# Patient Record
Sex: Male | Born: 1945
Health system: Southern US, Community
[De-identification: ages and names within clinical notes are randomized; demographics above are authoritative.]

## PROBLEM LIST (undated history)

## (undated) DIAGNOSIS — I951 Orthostatic hypotension: Secondary | ICD-10-CM

## (undated) DIAGNOSIS — G4730 Sleep apnea, unspecified: Secondary | ICD-10-CM

## (undated) DIAGNOSIS — T7840XA Allergy, unspecified, initial encounter: Secondary | ICD-10-CM

## (undated) DIAGNOSIS — G4733 Obstructive sleep apnea (adult) (pediatric): Secondary | ICD-10-CM

## (undated) DIAGNOSIS — K219 Gastro-esophageal reflux disease without esophagitis: Secondary | ICD-10-CM

## (undated) DIAGNOSIS — N200 Calculus of kidney: Secondary | ICD-10-CM

## (undated) DIAGNOSIS — Z789 Other specified health status: Secondary | ICD-10-CM

## (undated) DIAGNOSIS — H269 Unspecified cataract: Secondary | ICD-10-CM

## (undated) DIAGNOSIS — Z86010 Personal history of colonic polyps: Secondary | ICD-10-CM

## (undated) DIAGNOSIS — Z860101 Personal history of adenomatous and serrated colon polyps: Secondary | ICD-10-CM

## (undated) DIAGNOSIS — N400 Enlarged prostate without lower urinary tract symptoms: Secondary | ICD-10-CM

## (undated) DIAGNOSIS — F419 Anxiety disorder, unspecified: Secondary | ICD-10-CM

## (undated) DIAGNOSIS — F32A Depression, unspecified: Secondary | ICD-10-CM

## (undated) DIAGNOSIS — I10 Essential (primary) hypertension: Secondary | ICD-10-CM

## (undated) DIAGNOSIS — K649 Unspecified hemorrhoids: Secondary | ICD-10-CM

## (undated) DIAGNOSIS — R296 Repeated falls: Secondary | ICD-10-CM

## (undated) DIAGNOSIS — I639 Cerebral infarction, unspecified: Secondary | ICD-10-CM

## (undated) DIAGNOSIS — N138 Other obstructive and reflux uropathy: Secondary | ICD-10-CM

## (undated) DIAGNOSIS — N183 Chronic kidney disease, stage 3 unspecified: Secondary | ICD-10-CM

## (undated) DIAGNOSIS — N3941 Urge incontinence: Secondary | ICD-10-CM

## (undated) DIAGNOSIS — Z7409 Other reduced mobility: Secondary | ICD-10-CM

## (undated) DIAGNOSIS — I480 Paroxysmal atrial fibrillation: Secondary | ICD-10-CM

## (undated) DIAGNOSIS — M47812 Spondylosis without myelopathy or radiculopathy, cervical region: Secondary | ICD-10-CM

## (undated) DIAGNOSIS — E1121 Type 2 diabetes mellitus with diabetic nephropathy: Secondary | ICD-10-CM

## (undated) HISTORY — DX: Calculus of kidney: N20.0

## (undated) HISTORY — DX: Gastro-esophageal reflux disease without esophagitis: K21.9

## (undated) HISTORY — PX: OTHER SURGICAL HISTORY: SHX169

## (undated) HISTORY — DX: Repeated falls: R29.6

## (undated) HISTORY — DX: Obstructive sleep apnea (adult) (pediatric): G47.33

## (undated) HISTORY — DX: Other reduced mobility: Z74.09

## (undated) HISTORY — DX: Urge incontinence: N39.41

## (undated) HISTORY — DX: Depression, unspecified: F32.A

## (undated) HISTORY — DX: Chronic kidney disease, stage 3 unspecified: N18.30

## (undated) HISTORY — DX: Personal history of colonic polyps: Z86.010

## (undated) HISTORY — DX: Unspecified hemorrhoids: K64.9

## (undated) HISTORY — DX: Unspecified cataract: H26.9

## (undated) HISTORY — DX: Spondylosis without myelopathy or radiculopathy, cervical region: M47.812

## (undated) HISTORY — PX: CATARACT EXTRACTION: SUR2

## (undated) HISTORY — DX: Personal history of adenomatous and serrated colon polyps: Z86.0101

## (undated) HISTORY — DX: Anxiety disorder, unspecified: F41.9

## (undated) HISTORY — DX: Paroxysmal atrial fibrillation: I48.0

## (undated) HISTORY — DX: Orthostatic hypotension: I95.1

## (undated) HISTORY — DX: Other obstructive and reflux uropathy: N13.8

## (undated) HISTORY — DX: Other specified health status: Z78.9

## (undated) HISTORY — DX: Sleep apnea, unspecified: G47.30

## (undated) HISTORY — DX: Type 2 diabetes mellitus with diabetic nephropathy: E11.21

## (undated) HISTORY — PX: COLONOSCOPY: SHX174

## (undated) HISTORY — DX: Benign prostatic hyperplasia without lower urinary tract symptoms: N40.0

## (undated) HISTORY — DX: Allergy, unspecified, initial encounter: T78.40XA

---

## 1988-05-09 HISTORY — PX: ERCP: SHX60

## 1988-05-09 HISTORY — PX: CHOLECYSTECTOMY: SHX55

## 1991-05-10 HISTORY — PX: ANKLE SURGERY: SHX546

## 1996-05-09 HISTORY — PX: TONSILLECTOMY AND ADENOIDECTOMY: SUR1326

## 2011-12-01 ENCOUNTER — Ambulatory Visit (INDEPENDENT_AMBULATORY_CARE_PROVIDER_SITE_OTHER): Payer: Medicare Other | Admitting: Family Medicine

## 2011-12-01 ENCOUNTER — Encounter: Payer: Self-pay | Admitting: Family Medicine

## 2011-12-01 VITALS — BP 123/72 | HR 61 | Ht 70.5 in | Wt 221.0 lb

## 2011-12-01 DIAGNOSIS — F32A Depression, unspecified: Secondary | ICD-10-CM | POA: Insufficient documentation

## 2011-12-01 DIAGNOSIS — F329 Major depressive disorder, single episode, unspecified: Secondary | ICD-10-CM | POA: Insufficient documentation

## 2011-12-01 DIAGNOSIS — E119 Type 2 diabetes mellitus without complications: Secondary | ICD-10-CM | POA: Insufficient documentation

## 2011-12-01 DIAGNOSIS — I10 Essential (primary) hypertension: Secondary | ICD-10-CM

## 2011-12-01 LAB — POCT GLYCOSYLATED HEMOGLOBIN (HGB A1C): Hemoglobin A1C: 6.9

## 2011-12-01 MED ORDER — CLONIDINE HCL 0.3 MG PO TABS: 0.3000 mg | ORAL_TABLET | Freq: Two times a day (BID) | ORAL | Status: DC

## 2011-12-01 MED ORDER — METFORMIN HCL 500 MG PO TABS: 500.0000 mg | ORAL_TABLET | Freq: Two times a day (BID) | ORAL | Status: DC

## 2011-12-01 MED ORDER — CITALOPRAM HYDROBROMIDE 20 MG PO TABS: 20.0000 mg | ORAL_TABLET | Freq: Every day | ORAL | Status: DC

## 2011-12-01 MED ORDER — METOPROLOL TARTRATE 100 MG PO TABS: 100.0000 mg | ORAL_TABLET | Freq: Two times a day (BID) | ORAL | Status: DC

## 2011-12-01 NOTE — Progress Notes (Signed)
Subjective:    Patient ID: Benjamin Powell, male    DOB: 11-11-1945, 66 y.o.   MRN: [id number]  Hypertension This is a chronic problem. The current episode started more than 1 year ago. The problem has been resolved since onset. The problem is controlled. Pertinent negatives include no anxiety, blurred vision, chest pain, headaches, malaise/fatigue, neck pain, orthopnea, peripheral edema, shortness of breath or sweats. Risk factors for coronary artery disease include diabetes mellitus, male gender and smoking/tobacco exposure. Past treatments include ACE inhibitors, calcium channel blockers and diuretics. There are no compliance problems.  There is no history of angina, kidney disease, CAD/MI, CVA, heart failure, left ventricular hypertrophy, PVD, renovascular disease, retinopathy or a thyroid problem. There is no history of chronic renal disease, hyperparathyroidism, a hypertension causing med or sleep apnea.  Diabetes He presents for his follow-up diabetic visit. He has type 2 diabetes mellitus. Pertinent negatives for hypoglycemia include no headaches or sweats. Pertinent negatives for diabetes include no blurred vision and no chest pain. Pertinent negatives for diabetic complications include no CVA, PVD or retinopathy. Risk factors for coronary artery disease include tobacco exposure, stress, male sex, hypertension, diabetes mellitus and family history. Current diabetic treatment includes oral agent (monotherapy). He is following a diabetic and high salt diet. He rarely participates in exercise. An ACE inhibitor/angiotensin II receptor blocker is being taken. He does not see a podiatrist.Eye exam is not current.   should be noted that the patient is currently on 6 different hypertensive medications. He reports being at [facility] with a systolic blood pressure over 220. He wound up getting admitted and they have been steadily increasing his blood pressure since then.  He was hoping he might be  able to get off some of these medications since he takes 9 pills in the morning.  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 He is on a very low dose of metformin so we will check his A1c today. He has also been treated for depression with Celexa 20 mg daily he reports good results with that and he has been on the medication for about 3 years.  Review of Systems  Constitutional: Positive for activity change and unexpected weight change. Negative for malaise/fatigue.  HENT: Negative for neck pain.   Eyes: Negative for blurred vision.  Respiratory: Negative for shortness of breath.   Cardiovascular: Negative for chest pain and orthopnea.  Neurological: Negative for headaches.  All other systems reviewed and are negative.      BP 123/72  Pulse 61  Ht 5' 10.5" (1.791 m)  Wt 221 lb (100.245 kg)  BMI 31.26 kg/m2  SpO2 95% Objective:   Physical Exam  Constitutional: He is oriented to person, place, and time. He appears well-developed and well-nourished.  HENT:  Head: Normocephalic and atraumatic.  Eyes: Pupils are equal, round, and reactive to light.  Neck: Neck supple.  Cardiovascular: Normal rate and normal heart sounds.   No murmur heard. Pulmonary/Chest: Effort normal and breath sounds normal. No respiratory distress. He has no wheezes.  Neurological: He is alert and oriented to person, place, and time. No cranial nerve deficit.  Skin: Skin is warm and dry. He is not diaphoretic. No erythema.  Psychiatric: He has a normal mood and affect.   Lab Results  Component Value Date   HGBA1C 6.9 12/01/2011      Assessment & Plan:    #  1 hypertension. We're going to try to simplify his regimen. Samples of Tribenzor given 40/10/25 and prescription card given which he can check with his pharmacy and see if it would be covered by Medicare part D. This will replace his Norvasc lisinopril  and Hydrodiuril We will keep him on his Lopressor and Catapres for now.   #2 diabetes. We will increase his Glucophage to 500 mg 2 times a day. Encouraged to keep exercising.  #3 depression continue with the Celexa.   Returned in 3 months for reevaluation also suggest that he contacts the office about 2-4 days before his visit and we could get a CMP, A1c, lipid , TSH and a CBC at that time.

## 2011-12-01 NOTE — Patient Instructions (Signed)

## 2011-12-02 ENCOUNTER — Ambulatory Visit (INDEPENDENT_AMBULATORY_CARE_PROVIDER_SITE_OTHER): Payer: Medicare Other | Admitting: Physician Assistant

## 2011-12-02 ENCOUNTER — Encounter: Payer: Self-pay | Admitting: Physician Assistant

## 2011-12-02 VITALS — BP 106/69 | HR 52 | Ht 70.5 in | Wt 222.0 lb

## 2011-12-02 DIAGNOSIS — S335XXA Sprain of ligaments of lumbar spine, initial encounter: Secondary | ICD-10-CM

## 2011-12-02 DIAGNOSIS — Z23 Encounter for immunization: Secondary | ICD-10-CM

## 2011-12-02 DIAGNOSIS — S39012A Strain of muscle, fascia and tendon of lower back, initial encounter: Secondary | ICD-10-CM

## 2011-12-02 DIAGNOSIS — Z1211 Encounter for screening for malignant neoplasm of colon: Secondary | ICD-10-CM

## 2011-12-02 MED ORDER — ORPHENADRINE CITRATE ER 100 MG PO TB12: 100.0000 mg | ORAL_TABLET | Freq: Two times a day (BID) | ORAL | Status: AC

## 2011-12-02 NOTE — Progress Notes (Signed)
  Subjective:    Patient ID: Benjamin Powell, male    DOB: Jan 09, 1946, 66 y.o.   MRN: [id number]  HPI Patient presents to the clinic with one day of low back pain with movement. He was here see Dr. Thurmond Butts yesterday with hypertension and diabetes. He went home and tried to lift a heavy box and strained his lower left back. He denies hearing a pop. He has a history of low back strain. He denies any pain that radiates down his leg. Movement makes much worse laying down and resting makes the pain better. He has not tried anything to make better. He denies any bowel or bladder dysfunction.  He has never had a colonoscopy and is due for a Tdap.   Review of Systems     Objective:   Physical Exam  Constitutional: He is oriented to person, place, and time. He appears well-developed and well-nourished.  Musculoskeletal:       Range of motion was limited by pain in all directions at his waist. There is more pain when patient was asked to do a lateral side to knee motion of the right side. There is tenderness of the paraspinous muscles on the left lower back. No bony tenderness over the spine. Negative straight leg test bilaterally. Strength 5 out of 5 bilaterally.  Neurological: He is alert and oriented to person, place, and time.  Skin: Skin is warm and dry.  Psychiatric: He has a normal mood and affect. His behavior is normal.          Assessment & Plan:  Low back strain-gave patient Norflex to take twice a day for muscle spasm and musculoskeletal pain. He has taken this before and not had any sedative side effects. Told him to also start ibuprofen 600 800 mg up to 3 times a day for inflammation. He was recommended to ice affected areas for 20 minutes at a time. He was also given lower back stretches that he could start as tolerated. Call if not improving in the next 3 or 4 days.  Patient has never had a colonoscopy we will refer today.  Tdap given.

## 2011-12-02 NOTE — Patient Instructions (Addendum)
Icing affected area at a time. Take norflex up to twice a day. Ibuprofen 600mg  up to three times. Consider daily streches.   Low Back Strain with Rehab A strain is an injury in which a tendon or muscle is torn. The muscles and tendons of the lower back are vulnerable to strains. However, these muscles and tendons are very strong and require a great force to be injured. Strains are classified into three categories. Grade 1 strains cause pain, but the tendon is not lengthened. Grade 2 strains include a lengthened ligament, due to the ligament being stretched or partially ruptured. With grade 2 strains there is still function, although the function may be decreased. Grade 3 strains involve a complete tear of the tendon or muscle, and function is usually impaired. SYMPTOMS   Pain in the lower back.   Pain that affects one side more than the other.   Pain that gets worse with movement and may be felt in the hip, buttocks, or back of the thigh.   Muscle spasms of the muscles in the back.   Swelling along the muscles of the back.   Loss of strength of the back muscles.   Crackling sound (crepitation) when the muscles are touched.  CAUSES  Lower back strains occur when a force is placed on the muscles or tendons that is greater than they can handle. Common causes of injury include:  Prolonged overuse of the muscle-tendon units in the lower back, usually from incorrect posture.   A single violent injury or force applied to the back.  RISK INCREASES WITH:  Sports that involve twisting forces on the spine or a lot of bending at the waist (football, rugby, weightlifting, bowling, golf, tennis, speed skating, racquetball, swimming, running, gymnastics, diving).   Poor strength and flexibility.   Failure to warm up properly before activity.   Family history of lower back pain or disk disorders.   Previous back injury or surgery (especially fusion).   Poor posture with lifting,  especially heavy objects.   Prolonged sitting, especially with poor posture.  PREVENTION   Learn and use proper posture when sitting or lifting (maintain proper posture when sitting, lift using the knees and legs, not at the waist).   Warm up and stretch properly before activity.   Allow for adequate recovery between workouts.   Maintain physical fitness:   Strength, flexibility, and endurance.   Cardiovascular fitness.  PROGNOSIS  If treated properly, lower back strains usually heal within 6 weeks. RELATED COMPLICATIONS   Recurring symptoms, resulting in a chronic problem.   Chronic inflammation, scarring, and partial muscle-tendon tear.   Delayed healing or resolution of symptoms.   Prolonged disability.  TREATMENT  Treatment first involves the use of ice and medicine, to reduce pain and inflammation. The use of strengthening and stretching exercises may help reduce pain with activity. These exercises may be performed at home or with a therapist. Severe injuries may require referral to a therapist for further evaluation and treatment, such as ultrasound. Your caregiver may advise that you wear a back brace or corset, to help reduce pain and discomfort. Often, prolonged bed rest results in greater harm then benefit. Corticosteroid injections may be recommended. However, these should be reserved for the most serious cases. It is important to avoid using your back when lifting objects. At night, sleep on your back on a firm mattress with a pillow placed under your knees. If non-surgical treatment is unsuccessful, surgery may be needed.  MEDICATION   If pain medicine is needed, nonsteroidal anti-inflammatory medicines (aspirin and ibuprofen), or other minor pain relievers (acetaminophen), are often advised.   Do not take pain medicine for 7 days before surgery.   Prescription pain relievers may be given, if your caregiver thinks they are needed. Use only as directed and only as much  as you need.   Ointments applied to the skin may be helpful.   Corticosteroid injections may be given by your caregiver. These injections should be reserved for the most serious cases, because they may only be given a certain number of times.  HEAT AND COLD  Cold treatment (icing) should be applied for 10 to 15 minutes every 2 to 3 hours for inflammation and pain, and immediately after activity that aggravates your symptoms. Use ice packs or an ice massage.   Heat treatment may be used before performing stretching and strengthening activities prescribed by your caregiver, physical therapist, or athletic trainer. Use a heat pack or a warm water soak.  SEEK MEDICAL CARE IF:   Symptoms get worse or do not improve in 2 to 4 weeks, despite treatment.   You develop numbness, weakness, or loss of bowel or bladder function.   New, unexplained symptoms develop. (Drugs used in treatment may produce side effects.)  EXERCISES  RANGE OF MOTION (ROM) AND STRETCHING EXERCISES - Low Back Strain Most people with lower back pain will find that their symptoms get worse with excessive bending forward (flexion) or arching at the lower back (extension). The exercises which will help resolve your symptoms will focus on the opposite motion.  Your physician, physical therapist or athletic trainer will help you determine which exercises will be most helpful to resolve your lower back pain. Do not complete any exercises without first consulting with your caregiver. Discontinue any exercises which make your symptoms worse until you speak to your caregiver.  If you have pain, numbness or tingling which travels down into your buttocks, leg or foot, the goal of the therapy is for these symptoms to move closer to your back and eventually resolve. Sometimes, these leg symptoms will get better, but your lower back pain may worsen. This is typically an indication of progress in your rehabilitation. Be very alert to any changes in  your symptoms and the activities in which you participated in the 24 hours prior to the change. Sharing this information with your caregiver will allow him/her to most efficiently treat your condition.  These exercises may help you when beginning to rehabilitate your injury. Your symptoms may resolve with or without further involvement from your physician, physical therapist or athletic trainer. While completing these exercises, remember:   Restoring tissue flexibility helps normal motion to return to the joints. This allows healthier, less painful movement and activity.   An effective stretch should be held for at least 30 seconds.   A stretch should never be painful. You should only feel a gentle lengthening or release in the stretched tissue.  FLEXION RANGE OF MOTION AND STRETCHING EXERCISES: STRETCH - Flexion, Single Knee to Chest   Lie on a firm bed or floor with both legs extended in front of you.   Keeping one leg in contact with the floor, bring your opposite knee to your chest. Hold your leg in place by either grabbing behind your thigh or at your knee.   Pull until you feel a gentle stretch in your lower back. Hold __________ seconds.   Slowly release your grasp and repeat  the exercise with the opposite side.  Repeat __________ times. Complete this exercise __________ times per day.  STRETCH - Flexion, Double Knee to Chest   Lie on a firm bed or floor with both legs extended in front of you.   Keeping one leg in contact with the floor, bring your opposite knee to your chest.   Tense your stomach muscles to support your back and then lift your other knee to your chest. Hold your legs in place by either grabbing behind your thighs or at your knees.   Pull both knees toward your chest until you feel a gentle stretch in your lower back. Hold __________ seconds.   Tense your stomach muscles and slowly return one leg at a time to the floor.  Repeat __________ times. Complete this  exercise __________ times per day.  STRETCH - Low Trunk Rotation  Lie on a firm bed or floor. Keeping your legs in front of you, bend your knees so they are both pointed toward the ceiling and your feet are flat on the floor.   Extend your arms out to the side. This will stabilize your upper body by keeping your shoulders in contact with the floor.   Gently and slowly drop both knees together to one side until you feel a gentle stretch in your lower back. Hold for __________ seconds.   Tense your stomach muscles to support your lower back as you bring your knees back to the starting position. Repeat the exercise to the other side.  Repeat __________ times. Complete this exercise __________ times per day  EXTENSION RANGE OF MOTION AND FLEXIBILITY EXERCISES: STRETCH - Extension, Prone on Elbows   Lie on your stomach on the floor, a bed will be too soft. Place your palms about shoulder width apart and at the height of your head.   Place your elbows under your shoulders. If this is too painful, stack pillows under your chest.   Allow your body to relax so that your hips drop lower and make contact more completely with the floor.   Hold this position for __________ seconds.   Slowly return to lying flat on the floor.  Repeat __________ times. Complete this exercise __________ times per day.  RANGE OF MOTION - Extension, Prone Press Ups  Lie on your stomach on the floor, a bed will be too soft. Place your palms about shoulder width apart and at the height of your head.   Keeping your back as relaxed as possible, slowly straighten your elbows while keeping your hips on the floor. You may adjust the placement of your hands to maximize your comfort. As you gain motion, your hands will come more underneath your shoulders.   Hold this position __________ seconds.   Slowly return to lying flat on the floor.  Repeat __________ times. Complete this exercise __________ times per day.  RANGE OF  MOTION- Quadruped, Neutral Spine   Assume a hands and knees position on a firm surface. Keep your hands under your shoulders and your knees under your hips. You may place padding under your knees for comfort.   Drop your head and point your tail bone toward the ground below you. This will round out your lower back like an angry cat. Hold this position for __________ seconds.   Slowly lift your head and release your tail bone so that your back sags into a large arch, like an old horse.   Hold this position for __________ seconds.   Repeat  this until you feel limber in your lower back.   Now, find your "sweet spot." This will be the most comfortable position somewhere between the two previous positions. This is your neutral spine. Once you have found this position, tense your stomach muscles to support your lower back.   Hold this position for __________ seconds.  Repeat __________ times. Complete this exercise __________ times per day.  STRENGTHENING EXERCISES - Low Back Strain These exercises may help you when beginning to rehabilitate your injury. These exercises should be done near your "sweet spot." This is the neutral, low-back arch, somewhere between fully rounded and fully arched, that is your least painful position. When performed in this safe range of motion, these exercises can be used for people who have either a flexion or extension based injury. These exercises may resolve your symptoms with or without further involvement from your physician, physical therapist or athletic trainer. While completing these exercises, remember:   Muscles can gain both the endurance and the strength needed for everyday activities through controlled exercises.   Complete these exercises as instructed by your physician, physical therapist or athletic trainer. Increase the resistance and repetitions only as guided.   You may experience muscle soreness or fatigue, but the pain or discomfort you are trying  to eliminate should never worsen during these exercises. If this pain does worsen, stop and make certain you are following the directions exactly. If the pain is still present after adjustments, discontinue the exercise until you can discuss the trouble with your caregiver.  STRENGTHENING - Deep Abdominals, Pelvic Tilt  Lie on a firm bed or floor. Keeping your legs in front of you, bend your knees so they are both pointed toward the ceiling and your feet are flat on the floor.   Tense your lower abdominal muscles to press your lower back into the floor. This motion will rotate your pelvis so that your tail bone is scooping upwards rather than pointing at your feet or into the floor.   With a gentle tension and even breathing, hold this position for __________ seconds.  Repeat __________ times. Complete this exercise __________ times per day.  STRENGTHENING - Abdominals, Crunches   Lie on a firm bed or floor. Keeping your legs in front of you, bend your knees so they are both pointed toward the ceiling and your feet are flat on the floor. Cross your arms over your chest.   Slightly tip your chin down without bending your neck.   Tense your abdominals and slowly lift your trunk high enough to just clear your shoulder blades. Lifting higher can put excessive stress on the lower back and does not further strengthen your abdominal muscles.   Control your return to the starting position.  Repeat __________ times. Complete this exercise __________ times per day.  STRENGTHENING - Quadruped, Opposite UE/LE Lift   Assume a hands and knees position on a firm surface. Keep your hands under your shoulders and your knees under your hips. You may place padding under your knees for comfort.   Find your neutral spine and gently tense your abdominal muscles so that you can maintain this position. Your shoulders and hips should form a rectangle that is parallel with the floor and is not twisted.   Keeping your  trunk steady, lift your right hand no higher than your shoulder and then your left leg no higher than your hip. Make sure you are not holding your breath. Hold this position __________ seconds.  Continuing to keep your abdominal muscles tense and your back steady, slowly return to your starting position. Repeat with the opposite arm and leg.  Repeat __________ times. Complete this exercise __________ times per day.  STRENGTHENING - Lower Abdominals, Double Knee Lift  Lie on a firm bed or floor. Keeping your legs in front of you, bend your knees so they are both pointed toward the ceiling and your feet are flat on the floor.   Tense your abdominal muscles to brace your lower back and slowly lift both of your knees until they come over your hips. Be certain not to hold your breath.   Hold __________ seconds. Using your abdominal muscles, return to the starting position in a slow and controlled manner.  Repeat __________ times. Complete this exercise __________ times per day.  POSTURE AND BODY MECHANICS CONSIDERATIONS - Low Back Strain Keeping correct posture when sitting, standing or completing your activities will reduce the stress put on different body tissues, allowing injured tissues a chance to heal and limiting painful experiences. The following are general guidelines for improved posture. Your physician or physical therapist will provide you with any instructions specific to your needs. While reading these guidelines, remember:  The exercises prescribed by your provider will help you have the flexibility and strength to maintain correct postures.   The correct posture provides the best environment for your joints to work. All of your joints have less wear and tear when properly supported by a spine with good posture. This means you will experience a healthier, less painful body.   Correct posture must be practiced with all of your activities, especially prolonged sitting and standing.  Correct posture is as important when doing repetitive low-stress activities (typing) as it is when doing a single heavy-load activity (lifting).  RESTING POSITIONS Consider which positions are most painful for you when choosing a resting position. If you have pain with flexion-based activities (sitting, bending, stooping, squatting), choose a position that allows you to rest in a less flexed posture. You would want to avoid curling into a fetal position on your side. If your pain worsens with extension-based activities (prolonged standing, working overhead), avoid resting in an extended position such as sleeping on your stomach. Most people will find more comfort when they rest with their spine in a more neutral position, neither too rounded nor too arched. Lying on a non-sagging bed on your side with a pillow between your knees, or on your back with a pillow under your knees will often provide some relief. Keep in mind, being in any one position for a prolonged period of time, no matter how correct your posture, can still lead to stiffness. PROPER SITTING POSTURE In order to minimize stress and discomfort on your spine, you must sit with correct posture. Sitting with good posture should be effortless for a healthy body. Returning to good posture is a gradual process. Many people can work toward this most comfortably by using various supports until they have the flexibility and strength to maintain this posture on their own. When sitting with proper posture, your ears will fall over your shoulders and your shoulders will fall over your hips. You should use the back of the chair to support your upper back. Your lower back will be in a neutral position, just slightly arched. You may place a small pillow or folded towel at the base of your lower back for support.  When working at a desk, create an environment that supports good, upright  posture. Without extra support, muscles tire, which leads to excessive strain  on joints and other tissues. Keep these recommendations in mind: CHAIR:  A chair should be able to slide under your desk when your back makes contact with the back of the chair. This allows you to work closely.   The chair's height should allow your eyes to be level with the upper part of your monitor and your hands to be slightly lower than your elbows.  BODY POSITION  Your feet should make contact with the floor. If this is not possible, use a foot rest.   Keep your ears over your shoulders. This will reduce stress on your neck and lower back.  INCORRECT SITTING POSTURES  If you are feeling tired and unable to assume a healthy sitting posture, do not slouch or slump. This puts excessive strain on your back tissues, causing more damage and pain. Healthier options include:  Using more support, like a lumbar pillow.   Switching tasks to something that requires you to be upright or walking.   Talking a brief walk.   Lying down to rest in a neutral-spine position.  PROLONGED STANDING WHILE SLIGHTLY LEANING FORWARD  When completing a task that requires you to lean forward while standing in one place for a long time, place either foot up on a stationary 2-4 inch high object to help maintain the best posture. When both feet are on the ground, the lower back tends to lose its slight inward curve. If this curve flattens (or becomes too large), then the back and your other joints will experience too much stress, tire more quickly, and can cause pain. CORRECT STANDING POSTURES Proper standing posture should be assumed with all daily activities, even if they only take a few moments, like when brushing your teeth. As in sitting, your ears should fall over your shoulders and your shoulders should fall over your hips. You should keep a slight tension in your abdominal muscles to brace your spine. Your tailbone should point down to the ground, not behind your body, resulting in an over-extended swayback  posture.  INCORRECT STANDING POSTURES  Common incorrect standing postures include a forward head, locked knees and/or an excessive swayback. WALKING Walk with an upright posture. Your ears, shoulders and hips should all line-up. PROLONGED ACTIVITY IN A FLEXED POSITION When completing a task that requires you to bend forward at your waist or lean over a low surface, try to find a way to stabilize 3 out of 4 of your limbs. You can place a hand or elbow on your thigh or rest a knee on the surface you are reaching across. This will provide you more stability so that your muscles do not fatigue as quickly. By keeping your knees relaxed, or slightly bent, you will also reduce stress across your lower back. CORRECT LIFTING TECHNIQUES DO :   Assume a wide stance. This will provide you more stability and the opportunity to get as close as possible to the object which you are lifting.   Tense your abdominals to brace your spine. Bend at the knees and hips. Keeping your back locked in a neutral-spine position, lift using your leg muscles. Lift with your legs, keeping your back straight.   Test the weight of unknown objects before attempting to lift them.   Try to keep your elbows locked down at your sides in order get the best strength from your shoulders when carrying an object.   Always ask for help when  lifting heavy or awkward objects.  INCORRECT LIFTING TECHNIQUES DO NOT:   Lock your knees when lifting, even if it is a small object.   Bend and twist. Pivot at your feet or move your feet when needing to change directions.   Assume that you can safely pick up even a paper clip without proper posture.  Document Released: 04/25/2005 Document Revised: 04/14/2011 Document Reviewed: 08/07/2008 [facility] Patient Information 2012 [place], [place].

## 2011-12-05 ENCOUNTER — Ambulatory Visit: Payer: Medicare Other | Admitting: Physician Assistant

## 2011-12-07 ENCOUNTER — Encounter: Payer: Self-pay | Admitting: Internal Medicine

## 2011-12-19 ENCOUNTER — Emergency Department (INDEPENDENT_AMBULATORY_CARE_PROVIDER_SITE_OTHER)
Admission: EM | Admit: 2011-12-19 | Discharge: 2011-12-19 | Disposition: A | Discharge status: Home / Self Care | Payer: Medicare Other | Source: Home / Self Care

## 2011-12-19 ENCOUNTER — Emergency Department (HOSPITAL_BASED_OUTPATIENT_CLINIC_OR_DEPARTMENT_OTHER)
Admit: 2011-12-19 | Discharge: 2011-12-19 | Disposition: A | Discharge status: Home / Self Care | Payer: Medicare Other | Attending: Family Medicine | Admitting: Family Medicine

## 2011-12-19 ENCOUNTER — Ambulatory Visit (INDEPENDENT_AMBULATORY_CARE_PROVIDER_SITE_OTHER): Payer: Medicare Other | Admitting: Sports Medicine

## 2011-12-19 ENCOUNTER — Emergency Department (INDEPENDENT_AMBULATORY_CARE_PROVIDER_SITE_OTHER): Payer: Medicare Other

## 2011-12-19 ENCOUNTER — Encounter: Payer: Self-pay | Admitting: *Deleted

## 2011-12-19 DIAGNOSIS — W06XXXA Fall from bed, initial encounter: Secondary | ICD-10-CM

## 2011-12-19 DIAGNOSIS — M5136 Other intervertebral disc degeneration, lumbar region: Secondary | ICD-10-CM | POA: Insufficient documentation

## 2011-12-19 DIAGNOSIS — S0993XA Unspecified injury of face, initial encounter: Secondary | ICD-10-CM

## 2011-12-19 DIAGNOSIS — M47812 Spondylosis without myelopathy or radiculopathy, cervical region: Secondary | ICD-10-CM | POA: Insufficient documentation

## 2011-12-19 DIAGNOSIS — M542 Cervicalgia: Secondary | ICD-10-CM

## 2011-12-19 DIAGNOSIS — M549 Dorsalgia, unspecified: Secondary | ICD-10-CM

## 2011-12-19 DIAGNOSIS — M51369 Other intervertebral disc degeneration, lumbar region without mention of lumbar back pain or lower extremity pain: Secondary | ICD-10-CM | POA: Insufficient documentation

## 2011-12-19 DIAGNOSIS — S39012A Strain of muscle, fascia and tendon of lower back, initial encounter: Secondary | ICD-10-CM

## 2011-12-19 DIAGNOSIS — S161XXA Strain of muscle, fascia and tendon at neck level, initial encounter: Secondary | ICD-10-CM

## 2011-12-19 DIAGNOSIS — S139XXA Sprain of joints and ligaments of unspecified parts of neck, initial encounter: Secondary | ICD-10-CM

## 2011-12-19 HISTORY — DX: Essential (primary) hypertension: I10

## 2011-12-19 IMAGING — CR DG LUMBAR SPINE COMPLETE 4+V
6 series · 6 of 6 positions shown · non-contrast
Comparison: None.

CLINICAL DATA: Fall.  Back pain.

LUMBAR SPINE - COMPLETE 4+ VIEW

[view not recorded (1 of 6)]
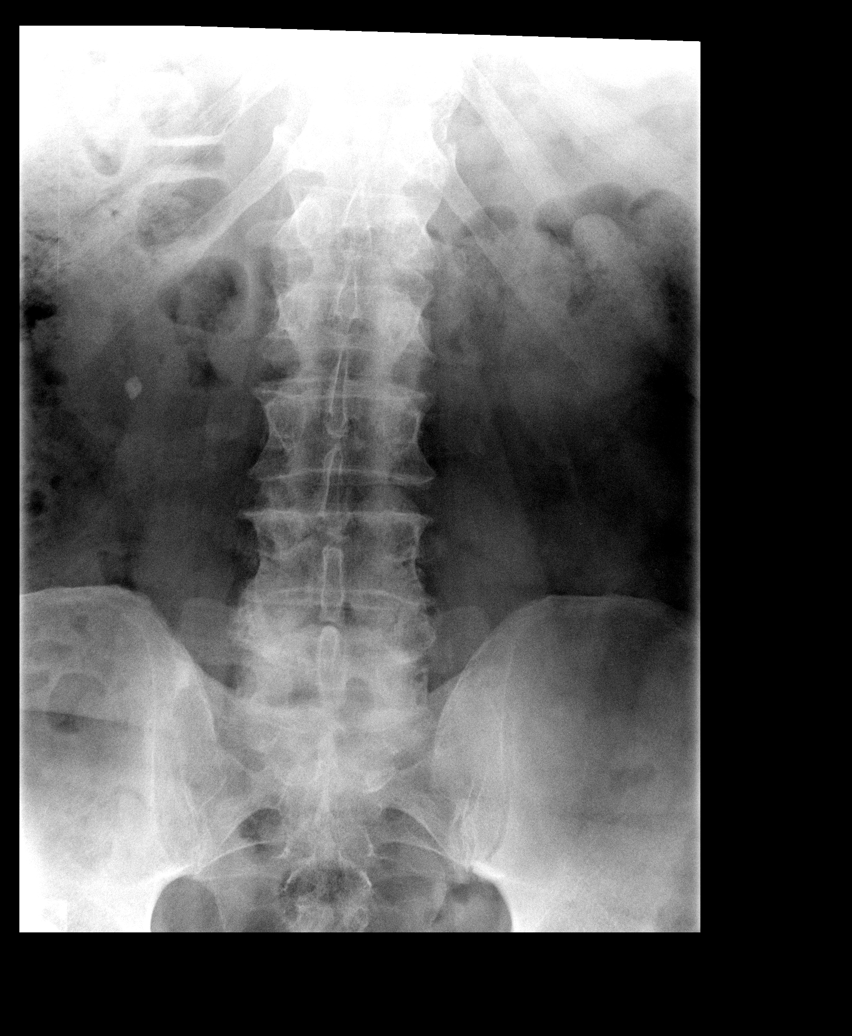

[view not recorded (2 of 6)]
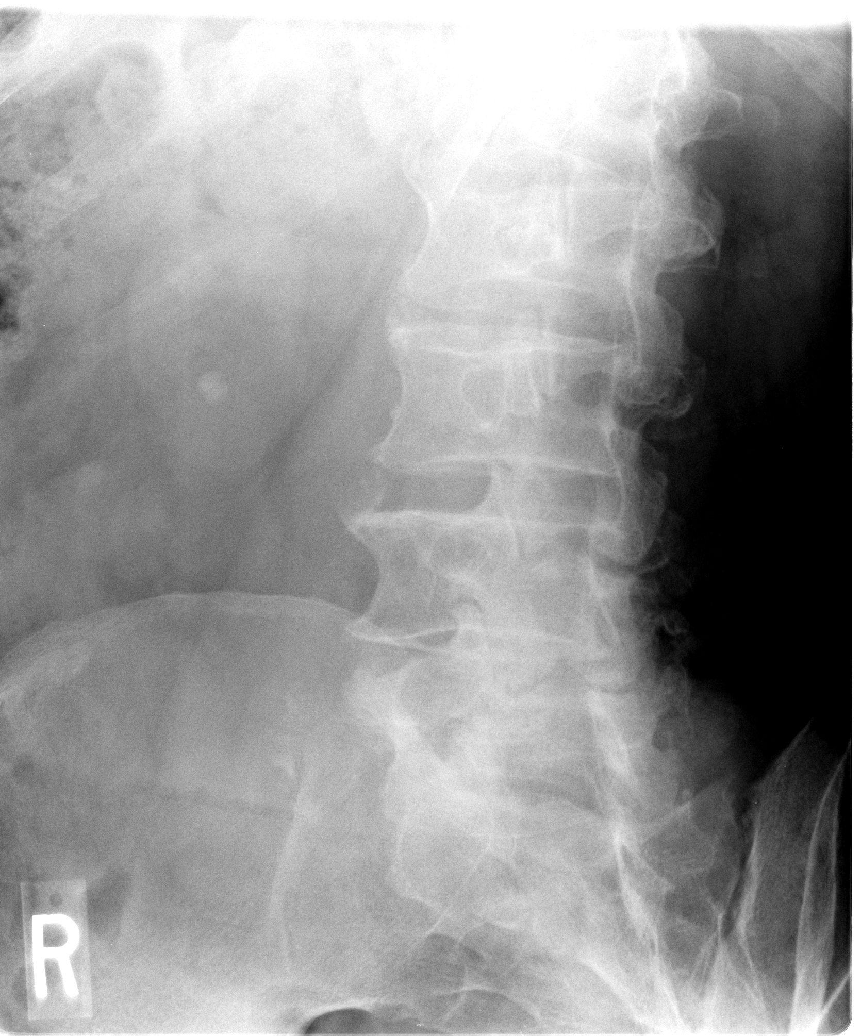

[view not recorded (3 of 6)]
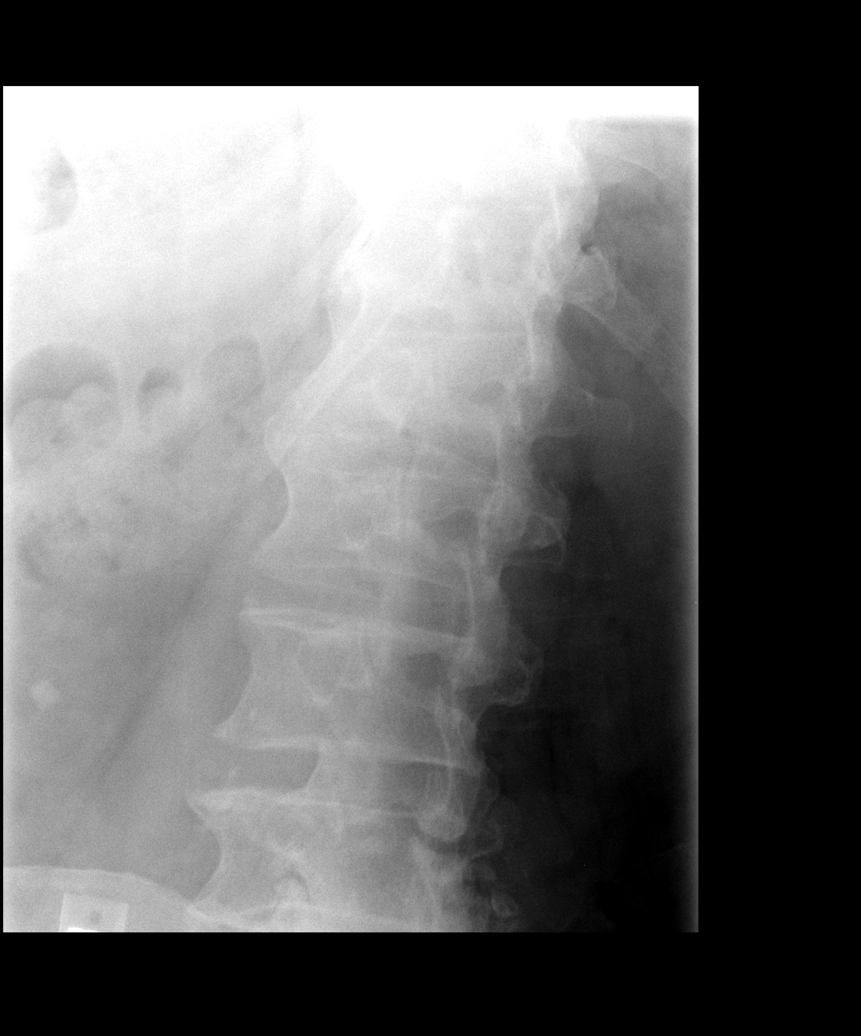

[view not recorded (4 of 6)]
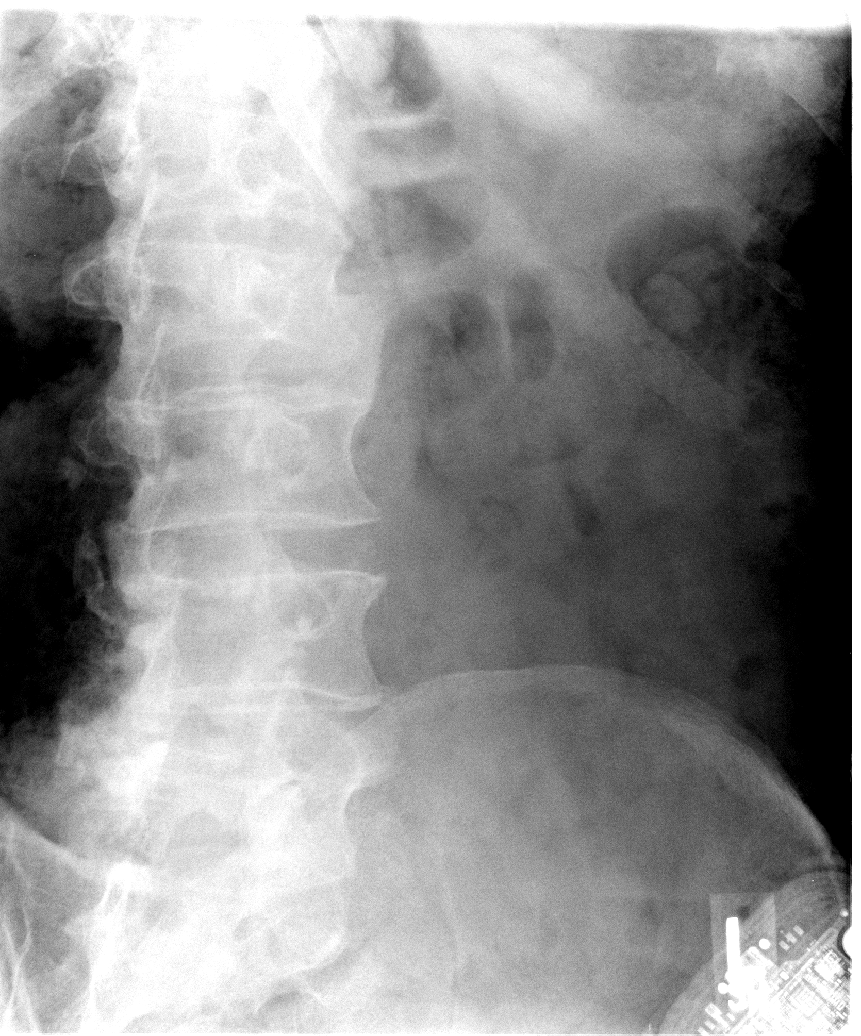

[view not recorded (5 of 6)]
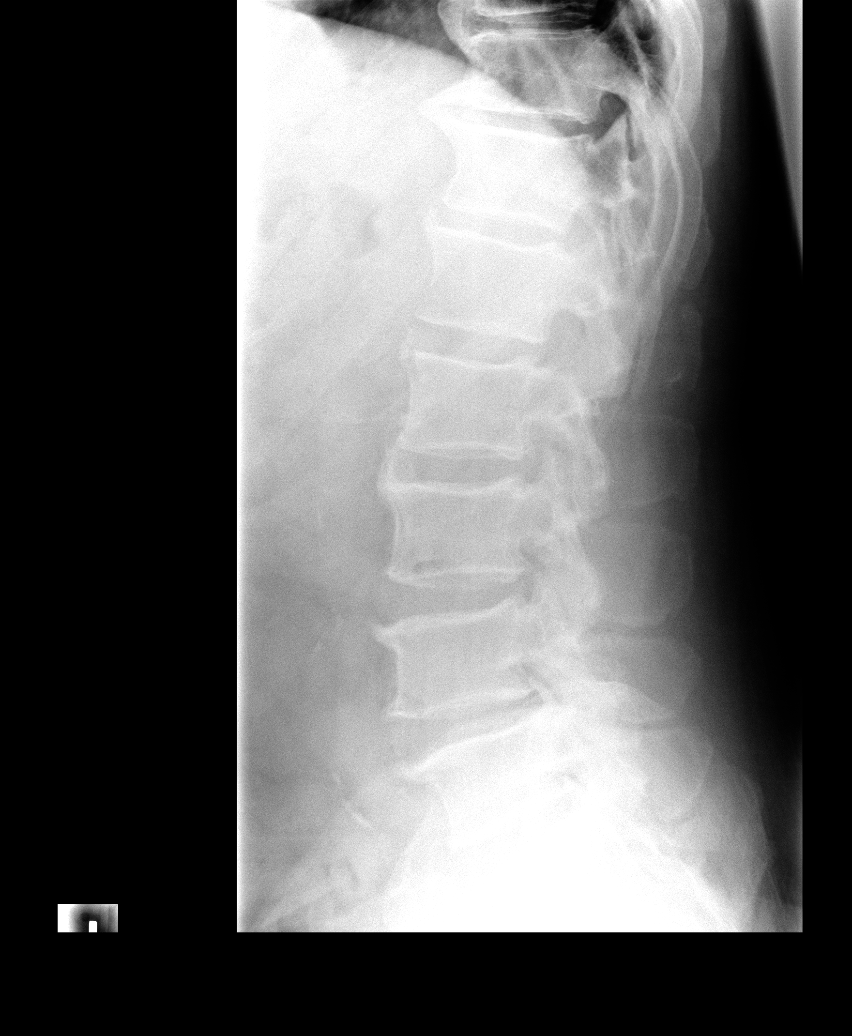

[view not recorded (6 of 6)]
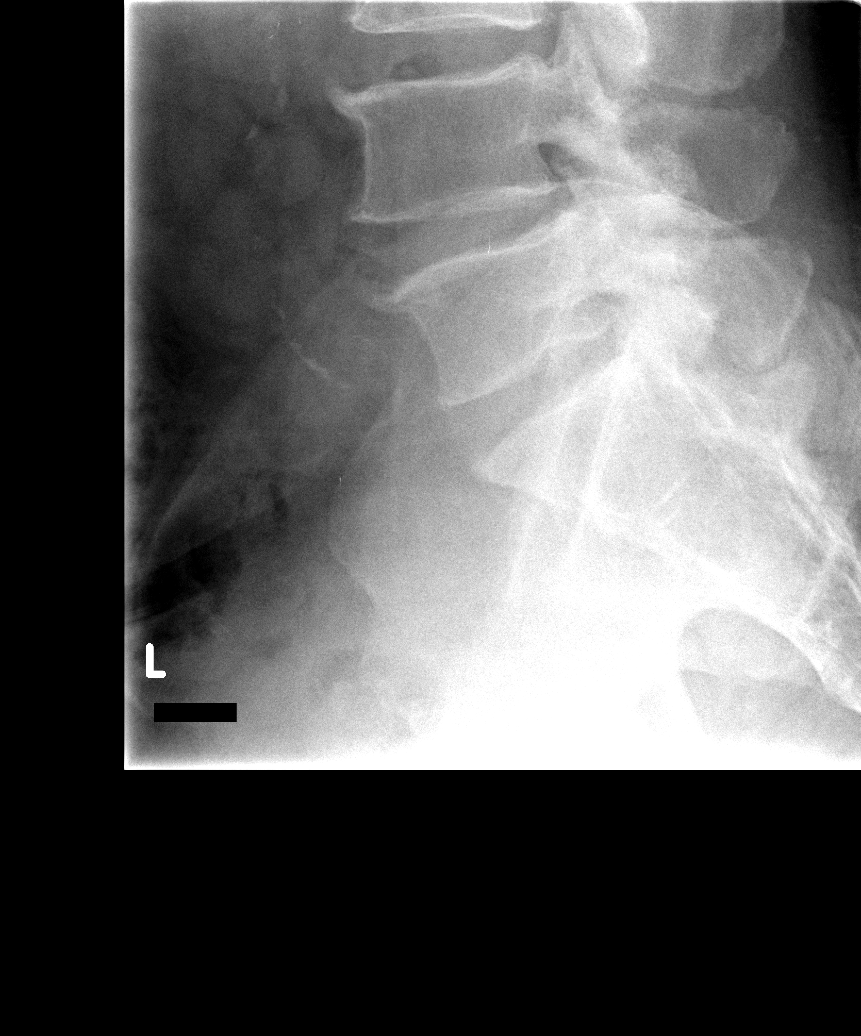

[6 of 6 positions shown; findings below may reference images not displayed]

FINDINGS: No evidence for fracture.  No subluxation.  Mild loss of
disc height is seen at L4-5 and L5-S1.  There is advanced
degenerative change in the lower lumbar facets.  SI joints are
unremarkable.  Probable stone seen in the lower pole of the right
kidney.
IMPRESSION: Degenerative changes in the lower lumbar spine without acute bony
findings.

Probable right renal stone.

## 2011-12-19 IMAGING — CT CT CERVICAL SPINE W/O CM
5 of 7 series · 15 of 33 positions shown, 16 images · non-contrast
Comparison: Radiography same day

CLINICAL DATA: Fell.  Left-sided pain.

CT CERVICAL SPINE WITHOUT CONTRAST
TECHNIQUE: Multidetector CT imaging of the cervical spine was
performed. Multiplanar CT image reconstructions were also
generated.

[Series 3: c_spine 2.0 b41s st · axial · 0.31mm/px · z∈[+1072,+1136]mm · 2 of 96 slices shown]
[im 32/96  bone]
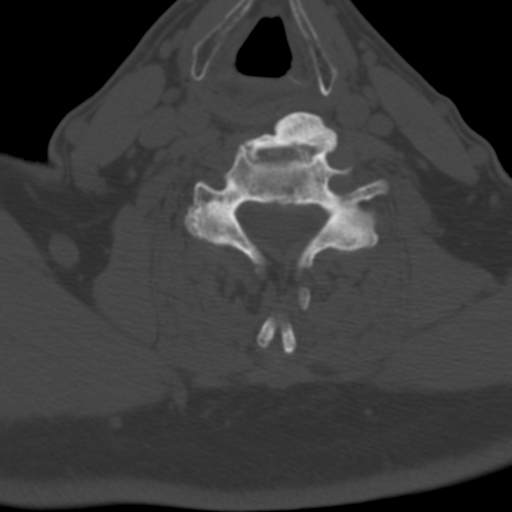
[im 64/96  bone]
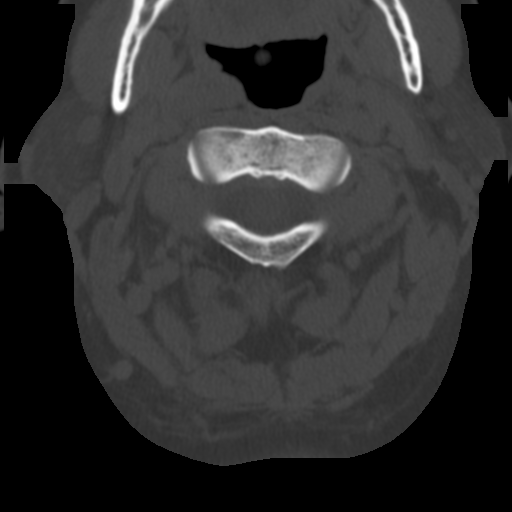

[Series 7: c_spine 2.0 sagittal · sagittal · 0.28mm/px · 5 of 65 slices shown]
[im 11/65  bone]
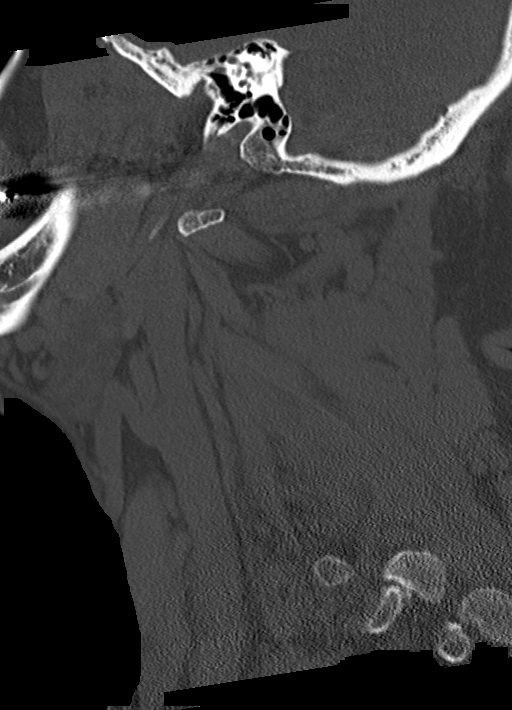
[im 22/65  bone]
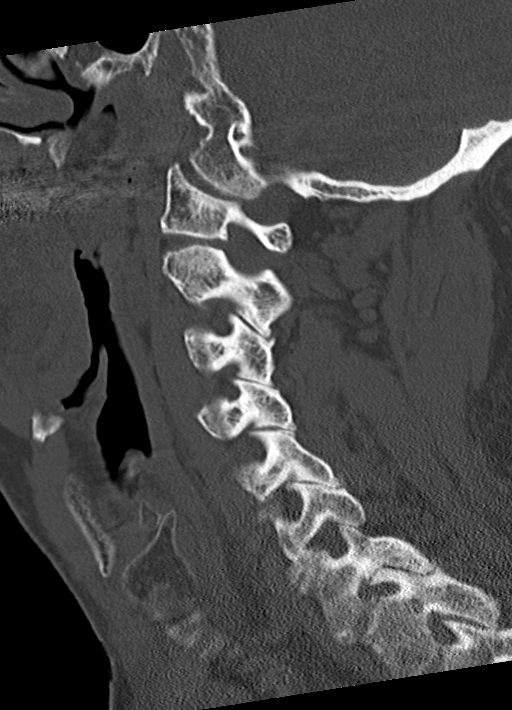
[im 33/65  bone]
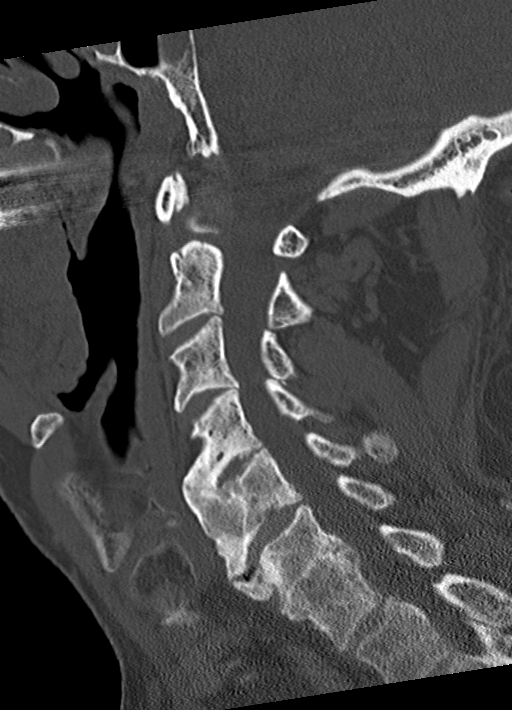
[im 43/65  bone]
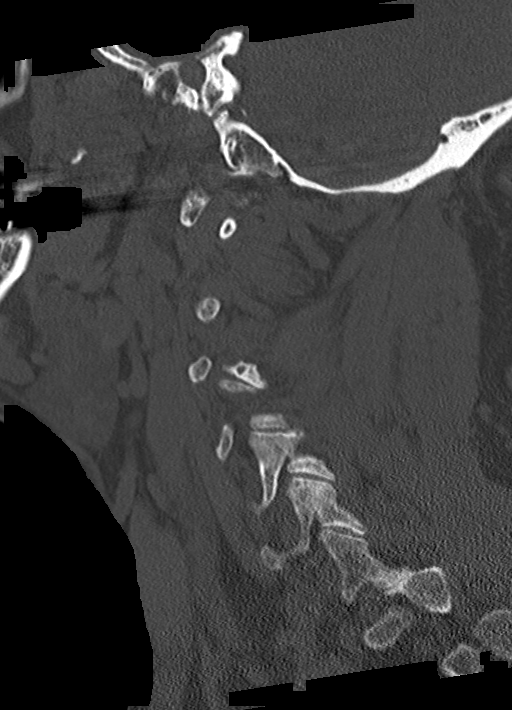
[im 54/65  bone]
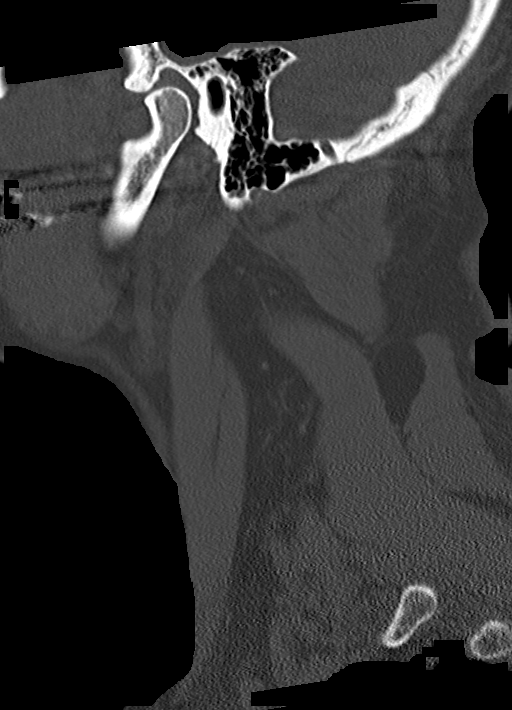

[Series 8: c_spine 2.0 orth ax · axial · 0.22mm/px · z∈[+1064,+1156]mm · 3 of 94 slices shown, 4 images (1 of 2)]
[im 24/94  soft-tissue]
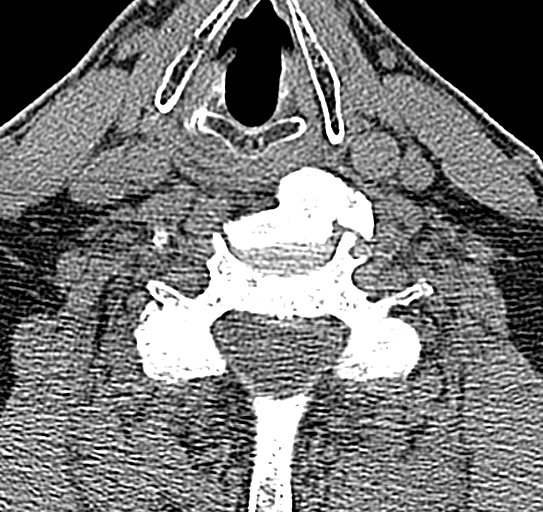
[im 24/94  bone]
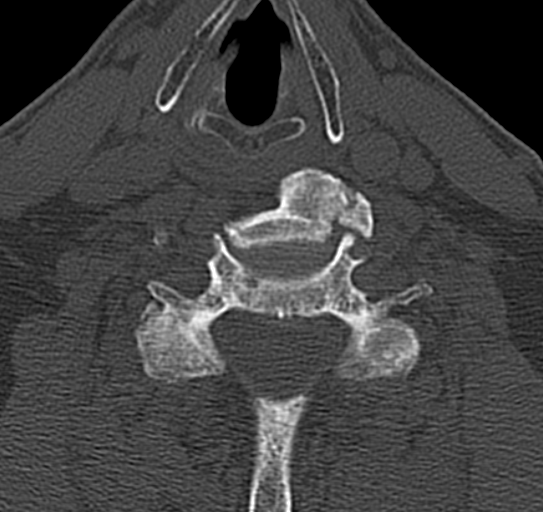
[im 47/94  bone]
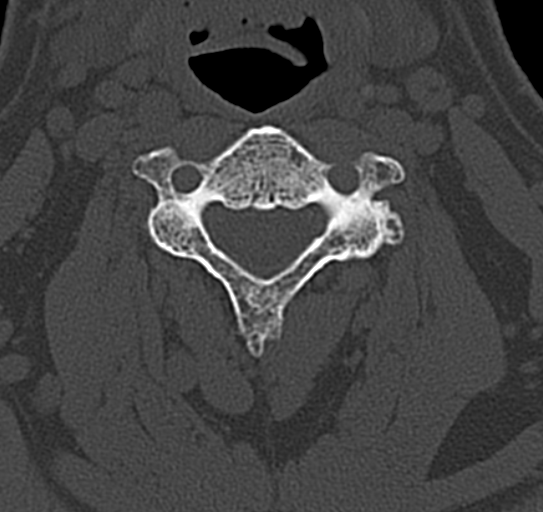
[im 70/94  bone]
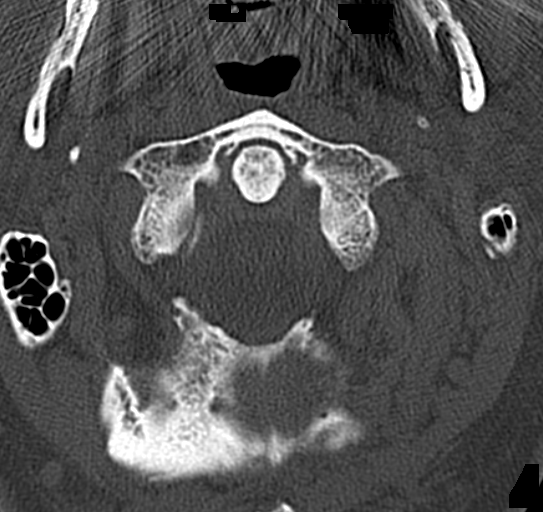

[Series 9: c_spine 2.0 coronal · coronal · 0.25mm/px · 3 of 61 slices shown]
[im 11/61  bone]
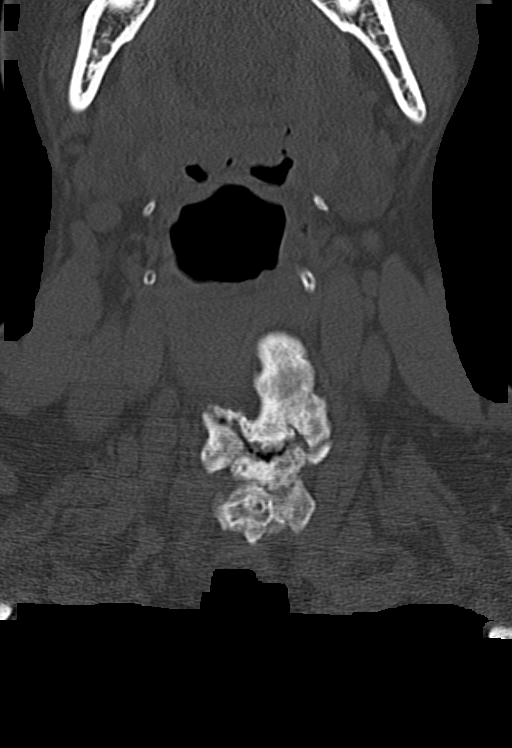
[im 34/61  bone]
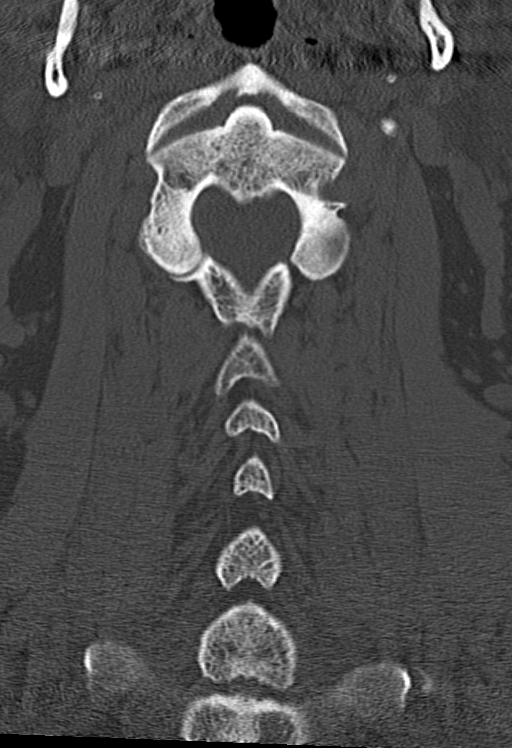
[im 57/61  bone]
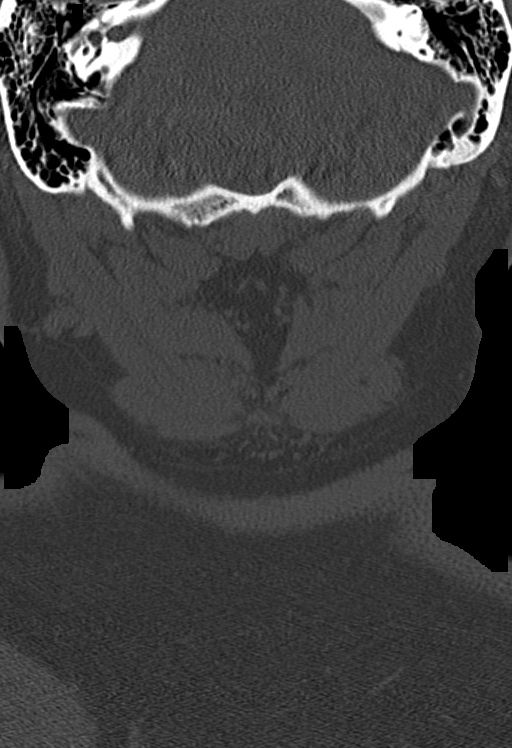

[Series 11: c_spine 2.0 orth ax · axial · 0.22mm/px · z∈[+1001,+1040]mm · 2 of 94 slices shown (2 of 2)]
[im 24/94  bone]
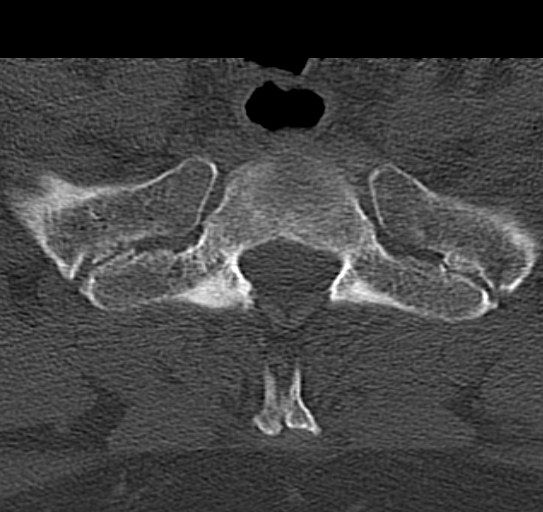
[im 47/94  bone]
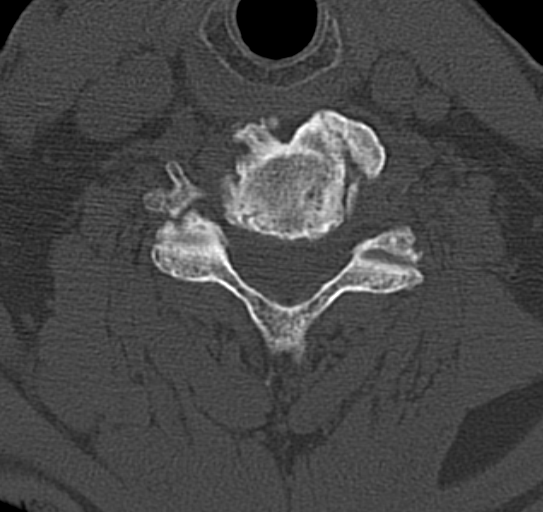

[15 of 33 positions shown; findings below may reference images not displayed]

FINDINGS: No evidence of regional fracture.  Foramen magnum is
unremarkable.  There is ordinary degenerative change between the
anterior arch of C1 and the dens.  C2-3 shows mild spondylosis and
facet degeneration.  C3-4 shows mild spondylosis and facet
degeneration.  C4-5 shows a fused anterior osteophytes.  No
stenosis.  C5-6 shows degenerative spondylosis with mild foraminal
narrowing and canal narrowing.  C6-7 shows old interbody fusion
which is solid.  C7-T1 shows facet arthropathy with 2 mm of
anterolisthesis and mild foraminal narrowing.
IMPRESSION: No acute or traumatic finding.  Chronic degenerative changes
throughout the cervical region as outlined above.

## 2011-12-19 IMAGING — CR DG CERVICAL SPINE COMPLETE 4+V
6 series · 6 of 6 positions shown · non-contrast
Comparison: None.

CLINICAL DATA: Neck pain after fall from bed.

CERVICAL SPINE - COMPLETE 4+ VIEW

[view not recorded (1 of 6)]
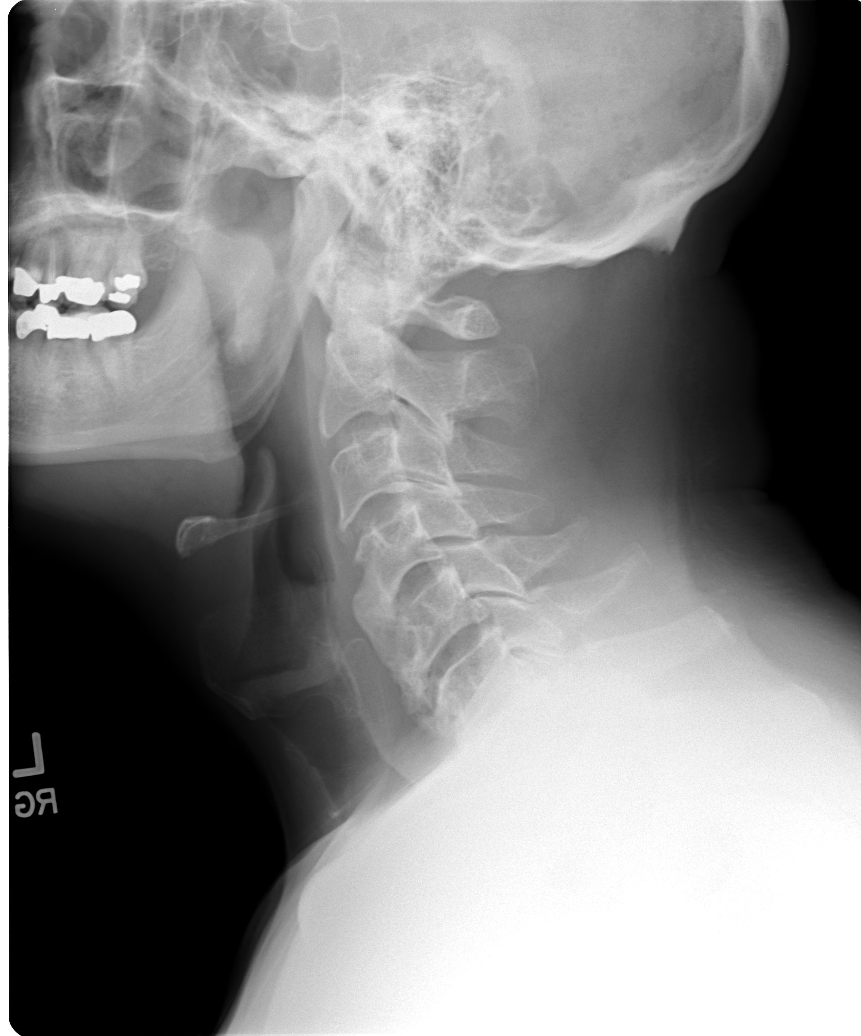

[view not recorded (2 of 6)]
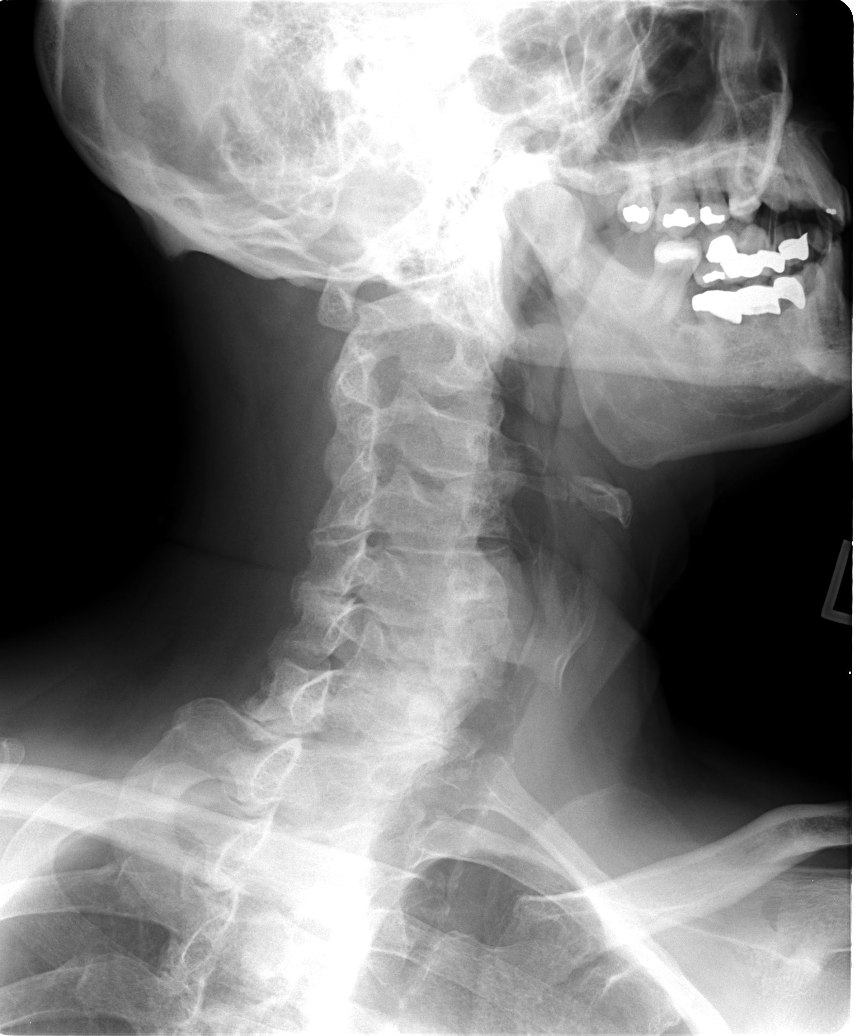

[view not recorded (3 of 6)]
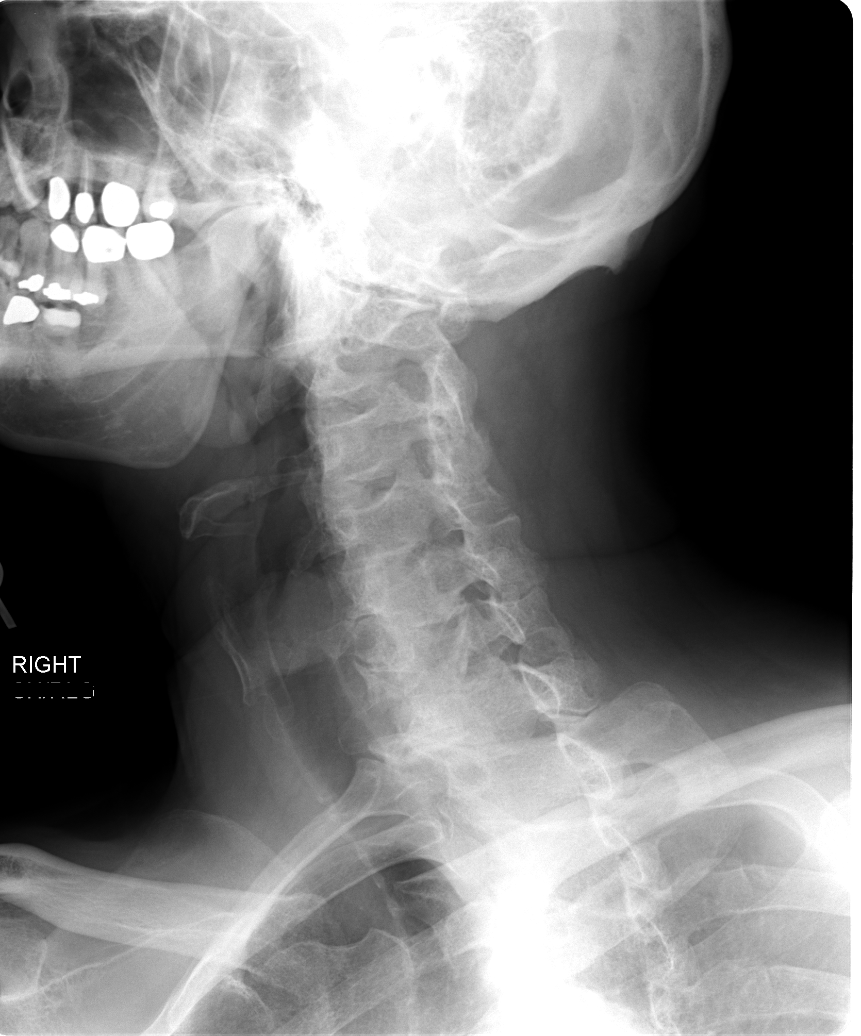

[view not recorded (4 of 6)]
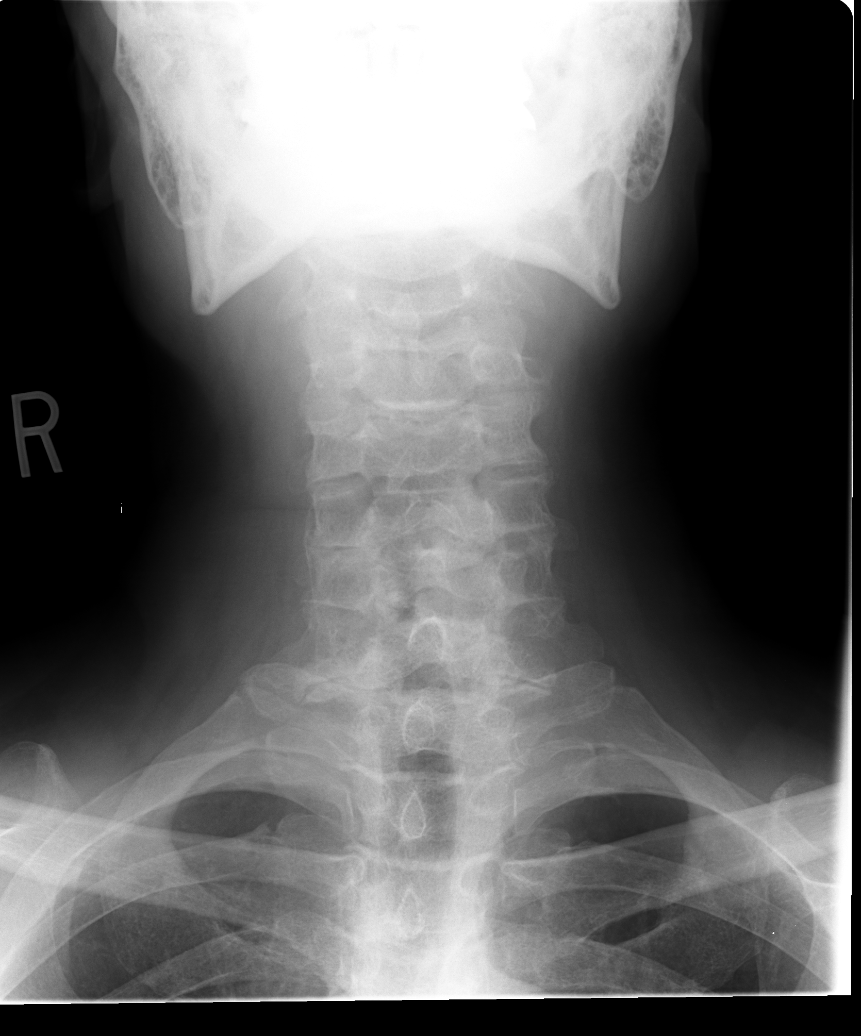

[view not recorded (5 of 6)]
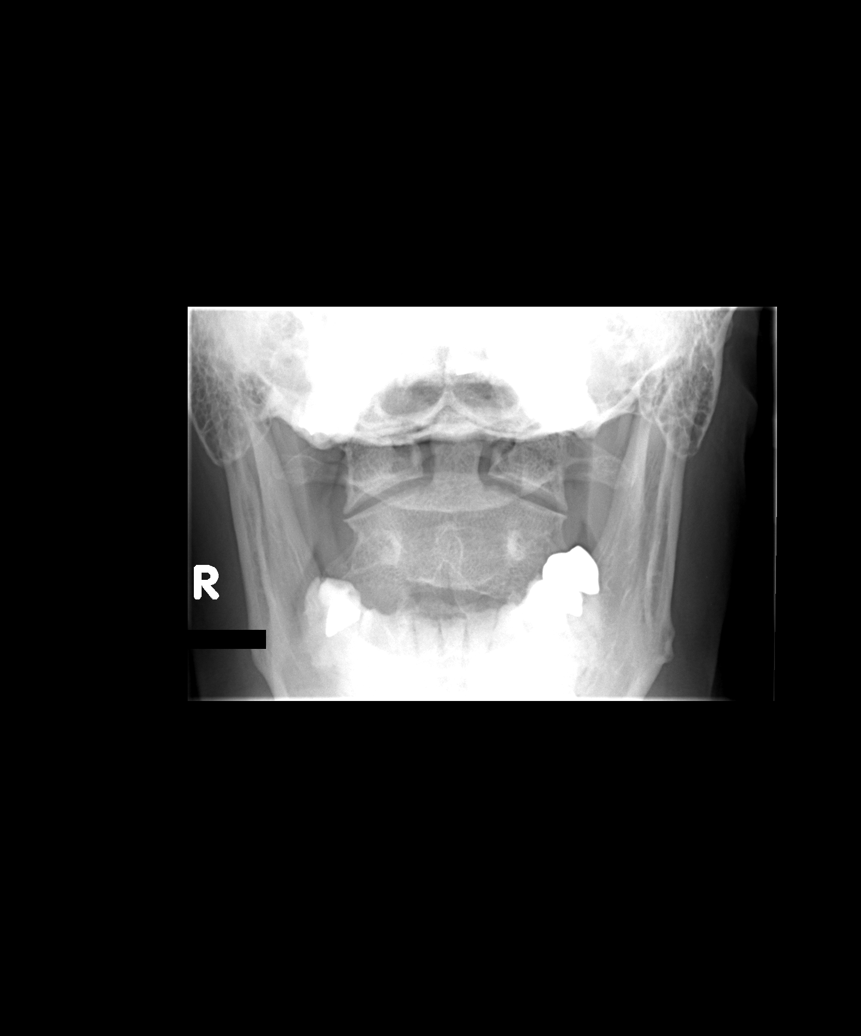

[view not recorded (6 of 6)]
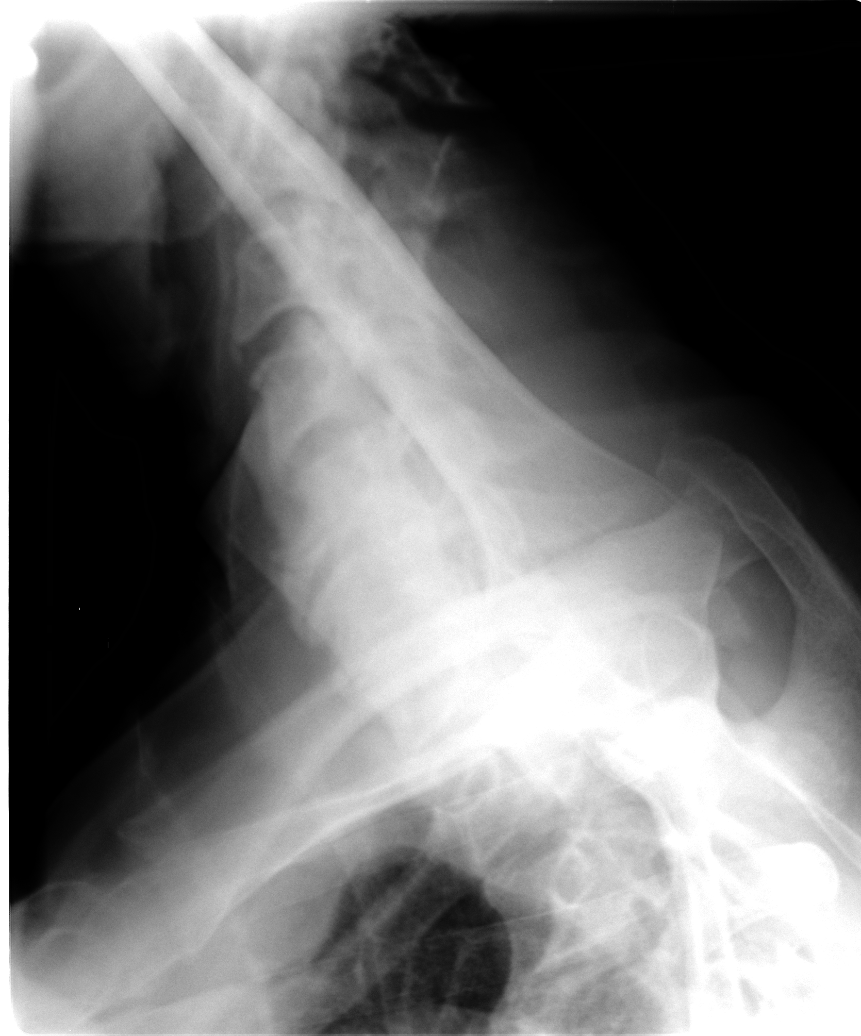

[6 of 6 positions shown; findings below may reference images not displayed]

FINDINGS: Five views study shows no evidence for an acute fracture
from the skull base to the C6 vertebral body.  C6-7 and C7-T1
levels not well seen on the lateral and swimmer's view images.
Marked anterior spurring is seen at C4-5 and C5-6 and oblique views
show uncinate hypertrophy in the lower cervical spine.  Normal
cervical lordosis is preserved.  No evidence for prevertebral soft
tissue swelling.
IMPRESSION: Limited study of the lower cervical spine and cervicothoracic
junction.  No evidence for an acute fracture from the skull base to
the C6 level with prominent degenerative changes in the mid and
lower cervical spine.  If there is high clinical index of concern
for lower cervical spine fracture, CT imaging would be a more
sensitive means to investigate.

## 2011-12-19 MED ORDER — TRAMADOL-ACETAMINOPHEN 37.5-325 MG PO TABS: 1.0000 | ORAL_TABLET | Freq: Four times a day (QID) | ORAL | Status: AC | PRN

## 2011-12-19 MED ORDER — CYCLOBENZAPRINE HCL 5 MG PO TABS: 5.0000 mg | ORAL_TABLET | Freq: Three times a day (TID) | ORAL | Status: AC | PRN

## 2011-12-19 NOTE — Progress Notes (Addendum)
Patient ID: Benjamin Powell, male   DOB: [birth year], 66 y.o.   MRN: [id number] Subjective:    I'm seeing this patient as a consultation for:  Dr. Alvester Morin  CC: Neck pain, back pain.  HPI: Benjamin Powell is a very pleasant 66 year old male, who approximately 4 days ago had a bad dream, and fell out of bed. He woke up with pain in both his neck, as well as his back. He did not seek medical attention immediately, and the back pain began to improve, unfortunately his neck pain started to worsen. He localizes the pain along the left trapezius muscle. He denies any radiation down into his hands, feet, arms or legs, but he does note the pain is worsened with abduction, reaching overhead with his left shoulder. He notes the position of abduction and internal rotation is very painful, abduction and external rotation relieves the pain. He also denies any loss of bowel or bladder continence.  He presented to urgent care for further evaluation and treatment.  Past medical history, Surgical history, Family history, Social history, Allergies, and medications have been entered into the medical record, reviewed, and no changes needed.   Review of Systems: No headache, visual changes, nausea, vomiting, diarrhea, constipation, dizziness, abdominal pain, skin rash, fevers, chills, night sweats, weight loss, body aches, joint swelling, muscle aches, chest pain, or shortness of breath.   Objective:    Vitals:  Afebrile, vital signs stable. General: Well Developed, well nourished, and in no acute distress.  Neuro: Alert and oriented x3, extra-ocular muscles intact.  Skin: Warm and dry, no rashes noted.  Respiratory: Not using accessory muscles, speaking in full sentences.  Cardiovascular: Pulses palpable, no extremity edema. Neck: Inspection unremarkable. No palpable stepoffs. Negative Spurling's maneuver. Full neck range of motion Grip strength and sensation normal in bilateral hands Strength good C4 to T1 distribution No  sensory change to C4 to T1 Negative Hoffman sign bilaterally Reflexes normal  Back Exam:  Inspection: Unremarkable  Motion: Flexion 45 deg, Extension 45 deg, Side Bending to 45 deg bilaterally,  Rotation to 45 deg bilaterally  SLR laying: Negative  XSLR laying: Negative  Palpable tenderness: None. FABER: negative. Sensory change: Gross sensation intact to all lumbar and sacral dermatomes.  Reflexes: 2+ at both patellar tendons, 2+ at achilles tendons, Babinski's downgoing.  Strength at foot  Plantar-flexion: 5/5 Dorsi-flexion: 5/5 Eversion: 5/5 Inversion: 5/5  Leg strength  Quad: 5/5 Hamstring: 5/5 Hip flexor: 5/5 Hip abductors: 5/5  Gait unremarkable.  Shoulder: Inspection reveals no abnormalities, atrophy or asymmetry. Palpation is normal with no tenderness over AC joint or bicipital groove. ROM is limited to abduction in his left arm. Rotator cuff strength weak for supraspinatus. Positive Neer, Hawkins, and empty can signs. Speeds and Yergason's tests normal. No labral pathology noted with negative Obrien's, negative clunk and good stability. Normal scapular function observed. No painful arc and no drop arm sign. No apprehension sign  I personally reviewed the x-rays of the cervical spine as well as lumbar spine. He has multilevel degenerative changes with multiple bridging osteophytes. There is no sign of fracture, subluxation, or instability.  Impression and Recommendations:

## 2011-12-19 NOTE — ED Notes (Signed)
Patient fell out of bed 4 days ago. C/o neck and lumbar pain. Taking IBF with minimal relief.

## 2011-12-19 NOTE — Assessment & Plan Note (Signed)
No sign of fracture, or instability of the lumbar spine. I agree with conservative care, analgesics, and possibly muscle relaxants. He will come to see me tomorrow for further evaluation and treatment.

## 2011-12-19 NOTE — Assessment & Plan Note (Addendum)
He has a cervical strain. His lower cervical spine is adequately evaluated on the swimmer's view x-ray. Dr. Alvester Morin will obtain a CT scan of his cervical spine for further evaluation, and I will review this personally. I also suspect that he has a rotator cuff strain. I will see him back tomorrow to further evaluate this.  I certainly appreciate this consultation.

## 2011-12-19 NOTE — ED Provider Notes (Signed)
History     CSN: [id number]  Arrival date & time 12/19/11  0850   None     Chief Complaint  Patient presents with  . Neck Injury  . Back Pain    lumbar   HPI Comments: Neck and lumbar back pain x 4 days.  Pt states that he had a bad dream and fell out of bed.  Pt states that he has had persistent neck and back pain since this point.  No hemiparesis or confusion.  Has been using NSAIDs with minimal improvement in sxs.  No radicular sxs in upper or lower extermities per pt.  No bowel/bladder anesthesia.  Has had lumbar back pain in the past.  Pain worse with neck rotation and with back flexion and prolonged standing.  No prior history of back surgery.    Patient is a 66 y.o. male presenting with neck injury and back pain.  Neck Injury This is a new problem. Episode onset: 3-4 days ago   Back Pain     Past Medical History  Diagnosis Date  . Hypertension   . Diabetes mellitus     Past Surgical History  Procedure Date  . Cholecystectomy     Family History  Problem Relation Age of Onset  . Stroke Brother     History  Substance Use Topics  . Smoking status: Current Everyday Smoker -- 0.3 packs/day    Types: Cigarettes  . Smokeless tobacco: Not on file  . Alcohol Use: No      Review of Systems  Musculoskeletal: Positive for back pain.  All other systems reviewed and are negative.    Allergies  Review of patient's allergies indicates no known allergies.  Home Medications   Current Outpatient Rx  Name Route Sig Dispense Refill  . AMLODIPINE BESYLATE 10 MG PO TABS Oral Take 10 mg by mouth daily.    . ASPIRIN 81 MG PO TABS Oral Take 81 mg by mouth daily.    Marland Kitchen CITALOPRAM HYDROBROMIDE 20 MG PO TABS Oral Take 1 tablet (20 mg total) by mouth daily. 90 tablet 3  . CLONIDINE HCL 0.3 MG PO TABS Oral Take 1 tablet (0.3 mg total) by mouth 2 (two) times daily. 180 tablet 3  . CYCLOBENZAPRINE HCL 5 MG PO TABS Oral Take 1 tablet (5 mg total) by mouth 3 (three)  times daily as needed for muscle spasms. 30 tablet 0  . HYDROCHLOROTHIAZIDE 25 MG PO TABS Oral Take 25 mg by mouth daily.    Marland Kitchen LISINOPRIL 40 MG PO TABS Oral Take 40 mg by mouth daily.    Marland Kitchen METFORMIN HCL 500 MG PO TABS Oral Take 1 tablet (500 mg total) by mouth 2 (two) times daily with a meal. 180 tablet 3  . METOPROLOL TARTRATE 100 MG PO TABS Oral Take 1 tablet (100 mg total) by mouth 2 (two) times daily. 180 tablet 3  . TRAMADOL-ACETAMINOPHEN 37.5-325 MG PO TABS Oral Take 1 tablet by mouth every 6 (six) hours as needed for pain. 30 tablet 0    BP 115/74  Pulse 57  Resp 16  Ht 6' (1.829 m)  Wt 224 lb (101.606 kg)  BMI 30.38 kg/m2  SpO2 94%  Physical Exam  Constitutional: He is oriented to person, place, and time. He appears well-developed and well-nourished.  HENT:  Head: Normocephalic and atraumatic.  Right Ear: External ear normal.  Left Ear: External ear normal.  Eyes: Conjunctivae are normal. Pupils are equal, round, and reactive to light.  Neck:       + TTP along L trapezius + Pain with resisted neck flexion spurlings negative.  + pain with abduction >90 deg on L shoulder, otherwise shoulder ROM, strength and sensation intact.     Cardiovascular: Normal rate and regular rhythm.   Pulmonary/Chest: Effort normal and breath sounds normal.  Abdominal: Soft. Bowel sounds are normal.  Musculoskeletal:       Mild lumbar TTP Mild pain with assisted hip flexion bilaterally FABER negative.  Neurovascularly intact distally    Neurological: He is alert and oriented to person, place, and time.       No focal neurological deficit   Skin: Skin is warm.    ED Course  Procedures (including critical care time)  Labs Reviewed - No data to display Dg Cervical Spine Complete  12/19/2011  *RADIOLOGY REPORT*  Clinical Data: Neck pain after fall from bed.  CERVICAL SPINE - COMPLETE 4+ VIEW  Comparison: None.  Findings: Five views study shows no evidence for an acute fracture from the  skull base to the C6 vertebral body.  C6-7 and C7-T1 levels not well seen on the lateral and swimmer's view images. Marked anterior spurring is seen at C4-5 and C5-6 and oblique views show uncinate hypertrophy in the lower cervical spine.  Normal cervical lordosis is preserved.  No evidence for prevertebral soft tissue swelling.  IMPRESSION: Limited study of the lower cervical spine and cervicothoracic junction.  No evidence for an acute fracture from the skull base to the C6 level with prominent degenerative changes in the mid and lower cervical spine.  If there is high clinical index of concern for lower cervical spine fracture, CT imaging would be a more sensitive means to investigate.  Original Report Authenticated By: ERIC A. MANSELL, M.D.   Dg Lumbar Spine Complete  12/19/2011  *RADIOLOGY REPORT*  Clinical Data: Fall.  Back pain.  LUMBAR SPINE - COMPLETE 4+ VIEW  Comparison: None.  Findings: No evidence for fracture.  No subluxation.  Mild loss of disc height is seen at L4-5 and L5-S1.  There is advanced degenerative change in the lower lumbar facets.  SI joints are unremarkable.  Probable stone seen in the lower pole of the right kidney.  IMPRESSION: Degenerative changes in the lower lumbar spine without acute bony findings.  Probable right renal stone.  Original Report Authenticated By: ERIC A. MANSELL, M.D.     1. Cervical strain   2. Lumbosacral strain       MDM  Will treat symptomatically with flexeril and ultracet.  Avoiding NSAIDs in setting of HTN, ACEi use, and DM.  Given poor visual quality in lower cervical xray, will send pt for CT cervical spine for more definitive imaging.  Plan for follow up with [facility].  Information discussed with Rodney Langton.     The patient and/or caregiver has been counseled thoroughly with regard to treatment plan and/or medications prescribed including dosage, schedule, interactions, rationale for use, and possible side effects  and they verbalize understanding. Diagnoses and expected course of recovery discussed and will return if not improved as expected or if the condition worsens. Patient and/or caregiver verbalized understanding.              Floydene Flock, MD 12/19/11 1013

## 2011-12-20 ENCOUNTER — Ambulatory Visit (INDEPENDENT_AMBULATORY_CARE_PROVIDER_SITE_OTHER): Payer: Medicare Other

## 2011-12-20 ENCOUNTER — Ambulatory Visit (INDEPENDENT_AMBULATORY_CARE_PROVIDER_SITE_OTHER): Payer: Medicare Other | Admitting: Sports Medicine

## 2011-12-20 ENCOUNTER — Encounter: Payer: Self-pay | Admitting: Sports Medicine

## 2011-12-20 VITALS — BP 109/70 | HR 58 | Temp 96.8°F | Resp 16 | Wt 226.0 lb

## 2011-12-20 DIAGNOSIS — M19019 Primary osteoarthritis, unspecified shoulder: Secondary | ICD-10-CM

## 2011-12-20 DIAGNOSIS — M542 Cervicalgia: Secondary | ICD-10-CM

## 2011-12-20 DIAGNOSIS — M25519 Pain in unspecified shoulder: Secondary | ICD-10-CM

## 2011-12-20 DIAGNOSIS — M549 Dorsalgia, unspecified: Secondary | ICD-10-CM

## 2011-12-20 IMAGING — CR DG SHOULDER 2+V*L*
3 series · 3 of 3 positions shown · non-contrast
Comparison: None.

CLINICAL DATA: Left shoulder pain, no acute injury

LEFT SHOULDER - 2+ VIEW

[view not recorded (1 of 3)]
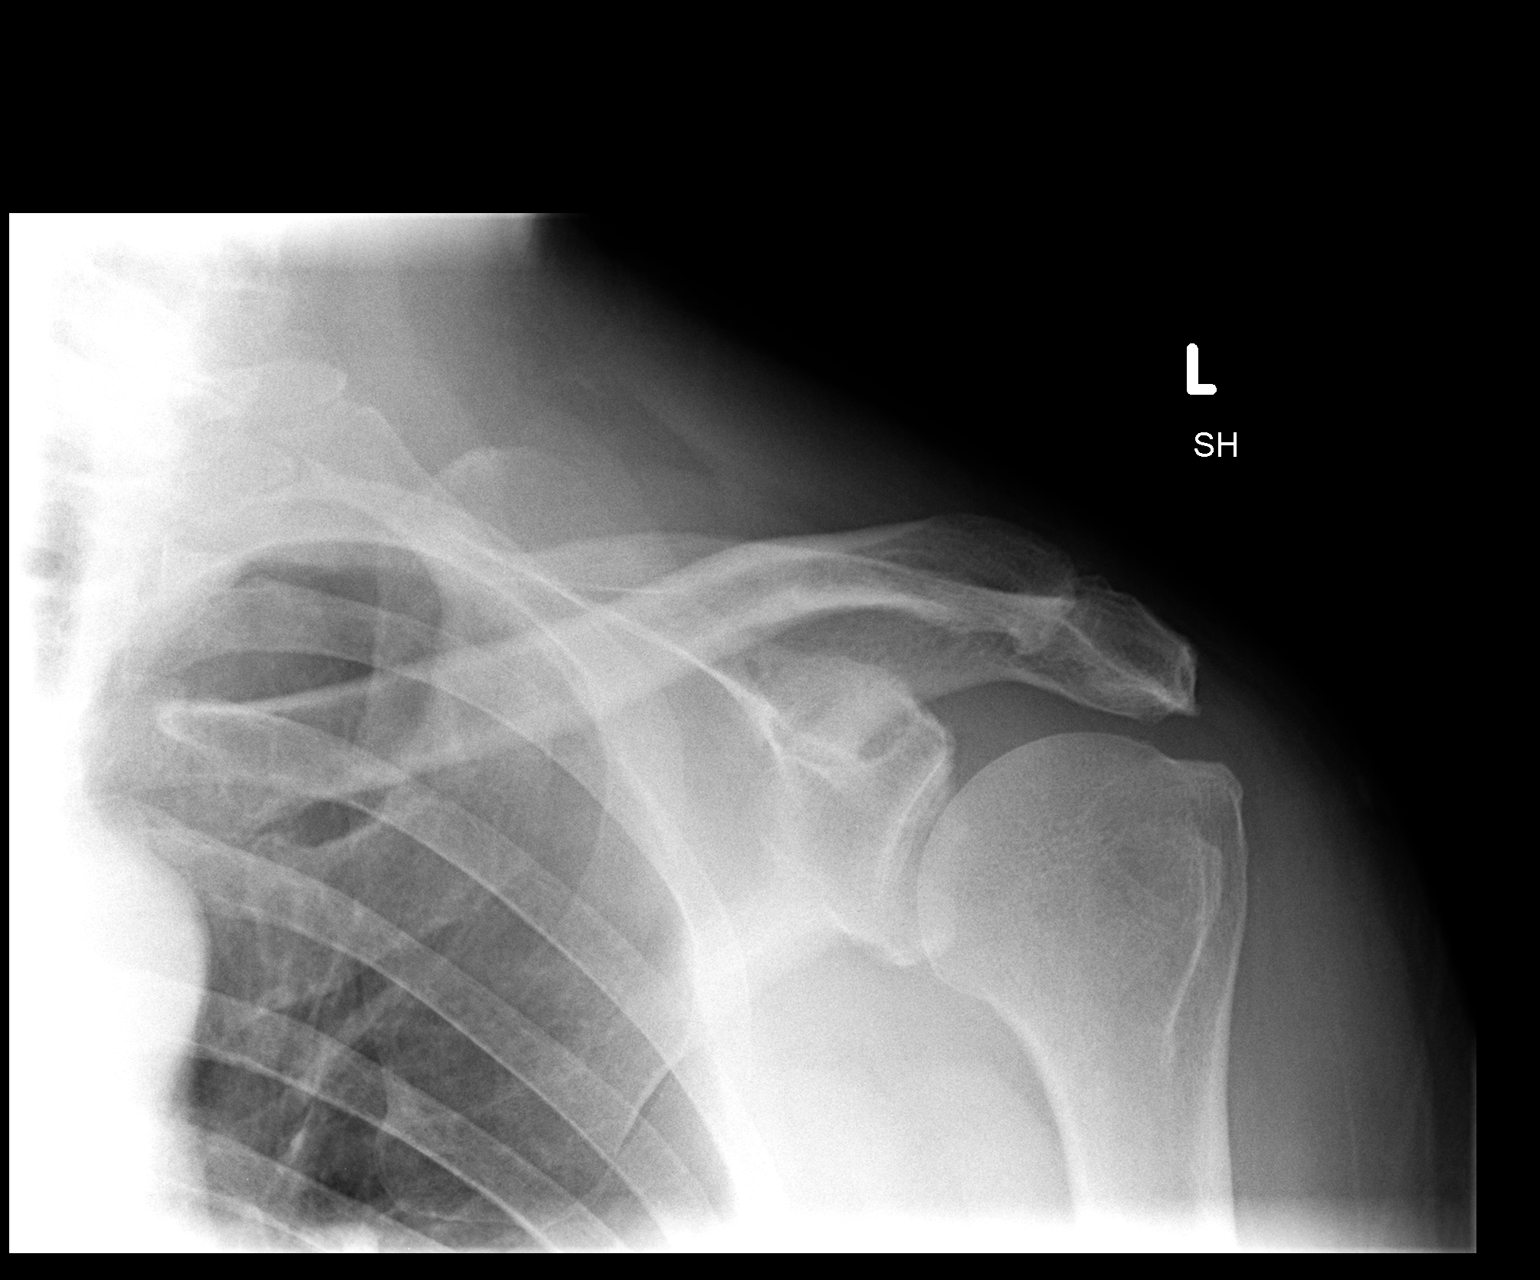

[view not recorded (2 of 3)]
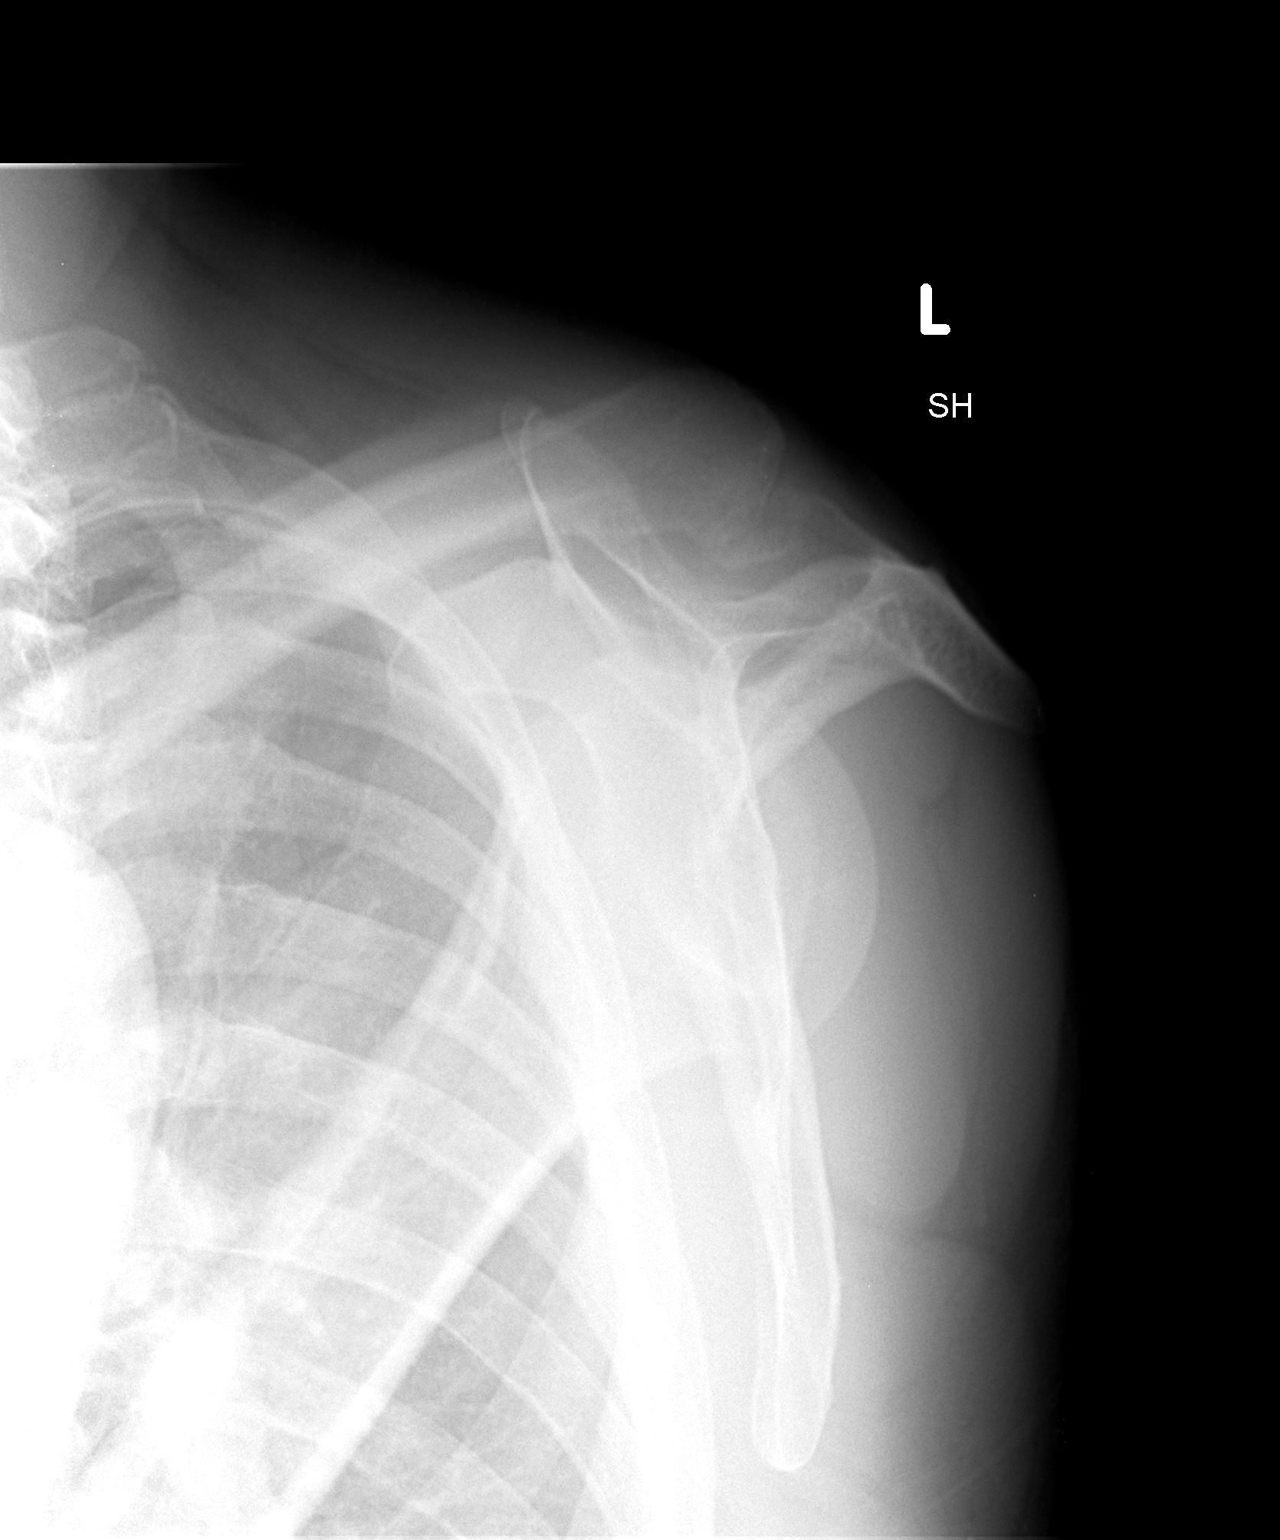

[view not recorded (3 of 3)]
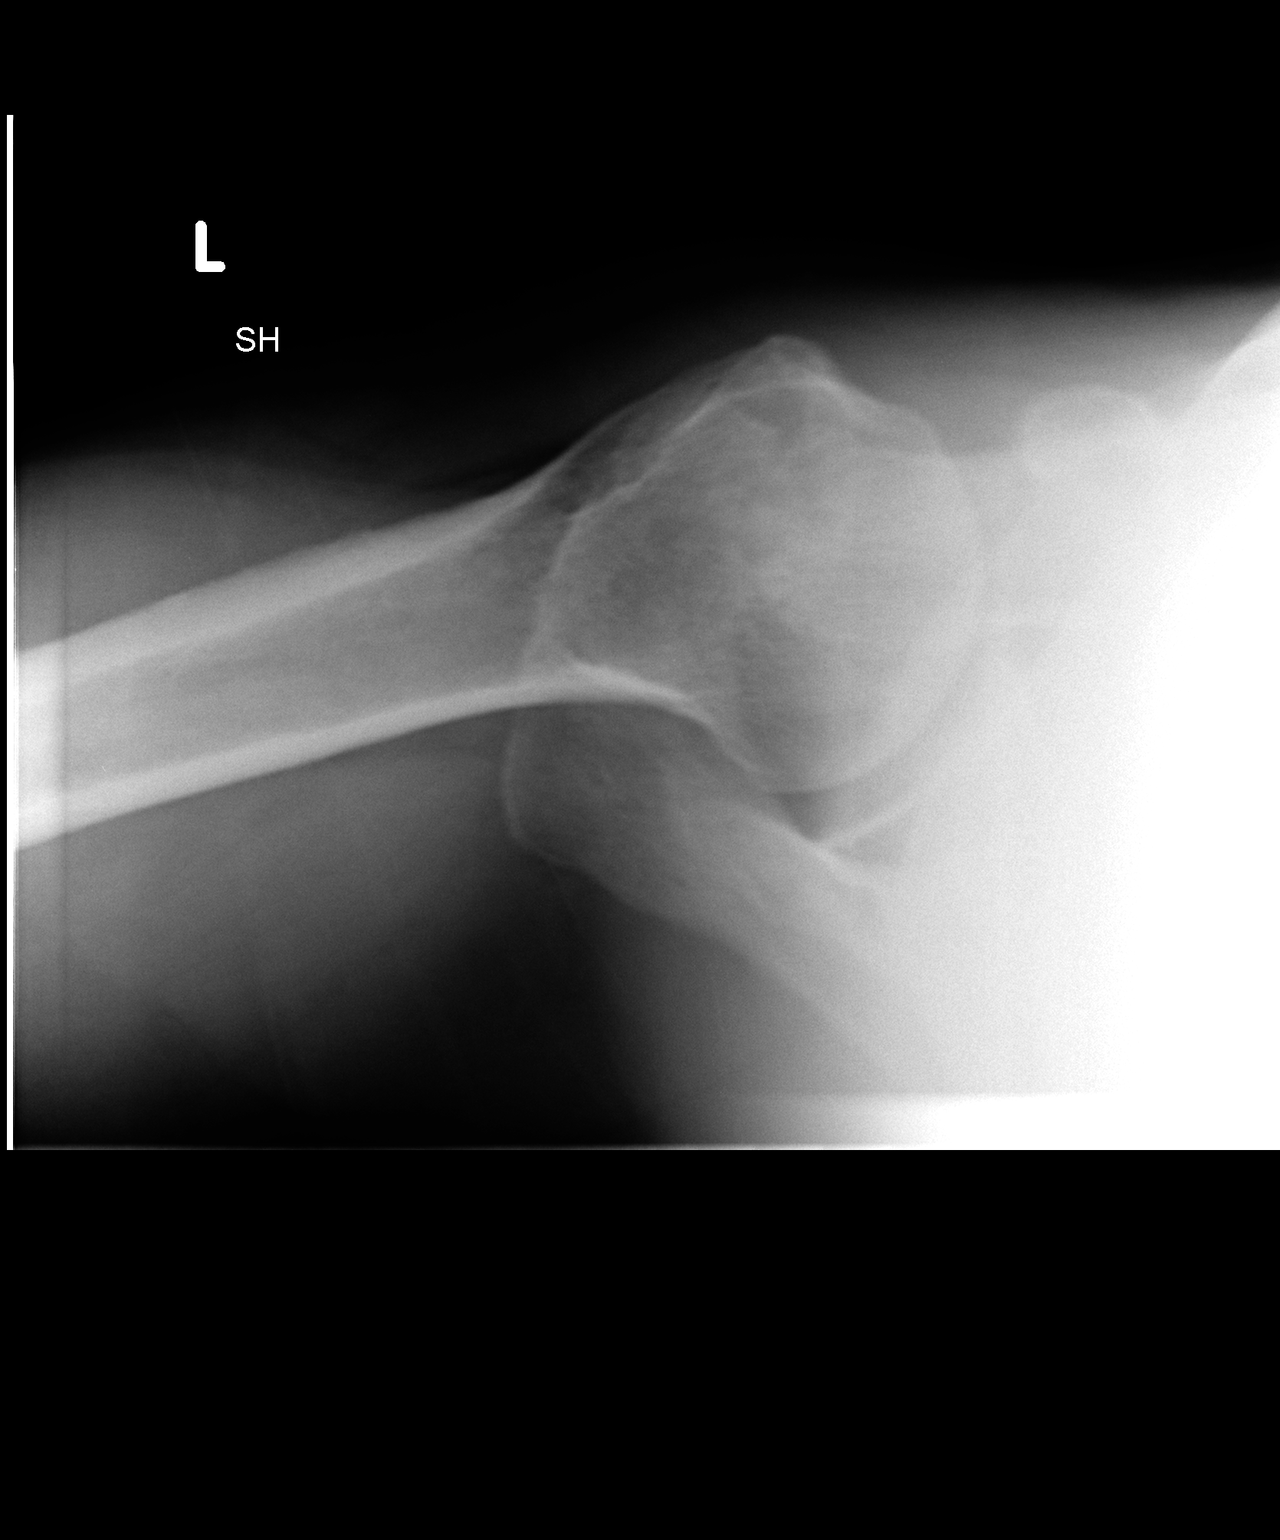

[3 of 3 positions shown; findings below may reference images not displayed]

FINDINGS: There is mild to moderate degenerative joint disease
involving the left glenohumeral joint with some loss of joint space
and spurring.  There is acromial spurring as well which can
predispose to impingement.  Degenerative change is noted involving
the left AC joint.
IMPRESSION: Mild to moderate degenerative joint disease of the left
glenohumeral joint and left AC joint.

## 2011-12-20 NOTE — Progress Notes (Addendum)
Patient ID: Benjamin Powell, male   DOB: 1946-02-25, 66 y.o.   MRN: [id number] Subjective:    CC: Followup neck, back, left shoulder.  HPI: Benjamin Powell is a pleasant 66 year old male who I'm seeing back after an injury sustained approximately a week ago. He had a bad dream, and fell out of bed, injuring his neck and back. I saw him yesterday the urgent care, and he was treated conservatively. Overall his neck and back pain are improving. I also noticed some signs of shoulder impingement, which remained. He notes these have only been present since the fall. He still has good strength, but pain with abduction his left shoulder, but he localizes over the deltoid. The pain does not radiate. It is better with ibuprofen and with the pain prescribed by Dr. Alvester Morin.  Left ankle pain: He is status post a complex fracture decades ago. He is had persistent pain, and lack of range of motion. He would like to discuss this, and we'll make an appointment later for this.   Past medical history, Surgical history, Family history, Social history, Allergies, and medications have been entered into the medical record, reviewed, and no changes needed.   Review of Systems: No fevers, chills, night sweats, weight loss, chest pain, or shortness of breath.   Objective:    General: Well Developed, well nourished, and in no acute distress.  Neuro: Alert and oriented x3, extra-ocular muscles intact.  HEENT: Normocephalic, atraumatic, pupils equal round reactive to light, neck supple, no masses, no lymphadenopathy, thyroid nonpalpable.  Skin: Warm and dry, no rashes. Cardiac: Regular rate and rhythm, no murmurs rubs or gallops.  Respiratory: Clear to auscultation bilaterally. Not using accessory muscles, speaking in full sentences.  Neck: Inspection unremarkable. No palpable stepoffs. Negative Spurling's maneuver. Full neck range of motion Grip strength and sensation normal in bilateral hands Strength good C4 to T1  distribution No sensory change to C4 to T1 Negative Hoffman sign bilaterally Reflexes normal  Back Exam:  Inspection: Unremarkable  Motion: Flexion 45 deg, Extension 45 deg, Side Bending to 45 deg bilaterally,  Rotation to 45 deg bilaterally  SLR laying: Negative  XSLR laying: Negative  Palpable tenderness: None. FABER: negative. Sensory change: Gross sensation intact to all lumbar and sacral dermatomes.  Reflexes: 2+ at both patellar tendons, 2+ at achilles tendons, Babinski's downgoing.  Strength at foot  Plantar-flexion: 5/5 Dorsi-flexion: 5/5 Eversion: 5/5 Inversion: 5/5  Leg strength  Quad: 5/5 Hamstring: 5/5 Hip flexor: 5/5 Hip abductors: 5/5  Gait unremarkable.  Left Shoulder: Inspection reveals no abnormalities, atrophy or asymmetry. Palpation is normal with no tenderness over AC joint or bicipital groove. ROM is limited in abduction to approximately 80. Rotator cuff strength normal throughout. Positive Neer and Hawkin's tests, empty can signs. Speeds and Yergason's tests normal. No labral pathology noted with negative Obrien's, negative clunk and good stability. Normal scapular function observed. No painful arc and no drop arm sign. No apprehension sign  X-rays were ordered, and interpreted by me. They included true AP, axillary, and scapular allergies of his left shoulder. It showed mild to moderate degenerative change of his glenohumeral joint, a type II acromion. He also has degenerative changes in the acromioclavicular joint  Procedure: Limited ultrasound of left shoulder Device: GE logiq E. Findings: Imaged the biceps tendon, subscapularis, acromioclavicular joint, supraspinatus, infraspinatus, teres minor. All tendons, joints, and ligamentous structures appeared intact. He did have a mild subacromial bursitis of the supraspinatus.  Impression: Subacromial, subdeltoid bursitis with intact rotator cuff.  Images  permanently stored in the unit and are available  for review.   Impression and Recommendations:

## 2011-12-20 NOTE — Patient Instructions (Addendum)
Great to see you again. Ibuprofen 800 mg 3 times a day for 2 weeks then just as needed. Do the rotator cuff, and the low back rehabilitation exercises. Come back to see me at your leisure to discuss your ankle pain, be sure to bring the x-rays. Benjamin Powell. Benjamin Stain, M.D.

## 2011-12-20 NOTE — Assessment & Plan Note (Signed)
He certainly has multiple cervical degenerative changes both on x-ray, and CT. Did sustain a cervical spine strain from the fall.  This is improving, however I do believe that much of his pain is related to rotator cuff tendinopathy, acute, traumatic. His rotator cuff is intact on ultrasound, I have given him to read the patient exercises. He should certainly continue ibuprofen 800 mg 3 times a day, narcotics for breakthrough pain. I will see him back in 4 weeks for this, and if no better we will consider an injection into his subacromial bursa.

## 2011-12-20 NOTE — Assessment & Plan Note (Signed)
Paralumbar strain. He will continue Flexeril, ibuprofen 800 mg 3 times a day, and his narcotics for breakthrough pain. He will also do some home rehabilitation exercises. I will see him back on an as-needed basis for this, specifically in 4 weeks if no better.

## 2011-12-21 NOTE — ED Provider Notes (Signed)
Agree with exam, assessment, and plan.   Lattie Haw, MD 12/21/11 [phone]

## 2011-12-28 ENCOUNTER — Encounter: Payer: Self-pay | Admitting: Gastroenterology

## 2011-12-30 ENCOUNTER — Encounter: Payer: Self-pay | Admitting: Sports Medicine

## 2011-12-30 ENCOUNTER — Ambulatory Visit (INDEPENDENT_AMBULATORY_CARE_PROVIDER_SITE_OTHER): Payer: Medicare Other | Admitting: Sports Medicine

## 2011-12-30 VITALS — BP 115/67 | HR 62 | Temp 97.8°F | Resp 16 | Wt 223.0 lb

## 2011-12-30 DIAGNOSIS — IMO0001 Reserved for inherently not codable concepts without codable children: Secondary | ICD-10-CM

## 2011-12-30 NOTE — Assessment & Plan Note (Signed)
Trigger point injection given x4 today. We will not change treatment strategy.  I will see him back in 2 weeks, this will put him at the four-week mark at which point we'll make the decision whether or not to pull the trigger for an MRI.  Also discussed the possibility of doing the zoster vaccine, I think is appropriate. We will defer this to his next visit.

## 2011-12-30 NOTE — Progress Notes (Signed)
Patient ID: Benjamin Powell, male   DOB: Jun 04, 1945, 66 y.o.   MRN: [id number] Subjective:    CC: worsening low back pain.  HPI: Benjamin Powell is a very pleasant 66 year old male who I saw 2 weeks ago after a fall resulting in a paralumbar spasm. He had no red flags, and only axial pain at that time. I gave him some home exercises, muscle relaxants, and Ultracet in addition to ibuprofen. Overall he was improving, unfortunately over the past day or so he's had some worsening pain in the same region area he localizes this to the left upper paralumbar musculature. It remains axial  Past medical history, Surgical history, Family history, Social history, Allergies, and medications have been entered into the medical record, reviewed, and no changes needed.   Review of Systems: No fevers, chills, night sweats, weight loss, chest pain, or shortness of breath.   Objective:    General: Well Developed, well nourished, and in no acute distress.  Neuro: Alert and oriented x3, extra-ocular muscles intact.  HEENT: Normocephalic, atraumatic, pupils equal round reactive to light, neck supple, no masses, no lymphadenopathy, thyroid nonpalpable.  Skin: Warm and dry, no rashes. Cardiac: Regular rate and rhythm, no murmurs rubs or gallops.  Respiratory: Clear to auscultation bilaterally. Not using accessory muscles, speaking in full sentences. Back Exam:  Inspection: Unremarkable  Motion: Flexion 45 deg, Extension 45 deg, Side Bending to 45 deg bilaterally,  Rotation to 45 deg bilaterally  SLR laying: Negative  XSLR laying: Negative  Palpable tenderness: left erector spinae in the upper lumbar region.there were 4 discrete palpable trigger points. FABER: negative. Sensory change: Gross sensation intact to all lumbar and sacral dermatomes.  Reflexes: 2+ at both patellar tendons, 2+ at achilles tendons, Babinski's downgoing.  Strength at foot  Plantar-flexion: 5/5 Dorsi-flexion: 5/5 Eversion: 5/5 Inversion: 5/5  Leg  strength  Quad: 5/5 Hamstring: 5/5 Hip flexor: 5/5 Hip abductors: 5/5  Gait unremarkable.  Procedure:  Injection of 4 trigger points along the left upper lumbar erector spinae. Consent obtained and verified. Time-out conducted. Noted no overlying erythema, induration, or other signs of local infection. Skin prepped in a sterile fashion. Topical analgesic spray: Ethyl chloride. Completed without difficulty. Meds: I placed 1/2 cc of Kenalog 40, and 1 cc of lidocaine into each painful per point. x4. Pain immediately improved suggesting accurate placement of the medication. Advised to call if fevers/chills, erythema, induration, drainage, or persistent bleeding.  Impression and Recommendations:

## 2012-01-02 ENCOUNTER — Other Ambulatory Visit: Payer: Self-pay | Admitting: *Deleted

## 2012-01-02 MED ORDER — OLMESARTAN-AMLODIPINE-HCTZ 40-10-25 MG PO TABS: 1.0000 | ORAL_TABLET | Freq: Every day | ORAL | Status: DC

## 2012-01-03 ENCOUNTER — Telehealth: Payer: Self-pay | Admitting: *Deleted

## 2012-01-03 DIAGNOSIS — I10 Essential (primary) hypertension: Secondary | ICD-10-CM

## 2012-01-03 MED ORDER — LISINOPRIL 40 MG PO TABS: 40.0000 mg | ORAL_TABLET | Freq: Every day | ORAL | Status: DC

## 2012-01-03 MED ORDER — METOPROLOL TARTRATE 100 MG PO TABS: 100.0000 mg | ORAL_TABLET | Freq: Two times a day (BID) | ORAL | Status: DC

## 2012-01-03 MED ORDER — CLONIDINE HCL 0.3 MG PO TABS: 0.3000 mg | ORAL_TABLET | Freq: Two times a day (BID) | ORAL | Status: DC

## 2012-01-03 MED ORDER — AMLODIPINE BESYLATE 10 MG PO TABS: 10.0000 mg | ORAL_TABLET | Freq: Every day | ORAL | Status: DC

## 2012-01-03 MED ORDER — HYDROCHLOROTHIAZIDE 25 MG PO TABS: 25.0000 mg | ORAL_TABLET | Freq: Every day | ORAL | Status: DC

## 2012-01-03 NOTE — Telephone Encounter (Signed)
Pt called yesterday for refill on Tribenzor but calls back today stating that it is too expensive even w/ the savings card. Pt would like to go back on his previous meds. Please advise.

## 2012-01-04 ENCOUNTER — Telehealth: Payer: Self-pay | Admitting: *Deleted

## 2012-01-04 NOTE — Telephone Encounter (Signed)
LMOM informing Pt  

## 2012-01-04 NOTE — Telephone Encounter (Signed)
Pt is asking if he can get a refill on Flexeril. Please advise.

## 2012-01-05 MED ORDER — CYCLOBENZAPRINE HCL 5 MG PO TABS: 5.0000 mg | ORAL_TABLET | Freq: Three times a day (TID) | ORAL | Status: DC | PRN

## 2012-01-05 NOTE — Telephone Encounter (Signed)
Not prescribed by [place]. It was prescribed by Dr. Shelle Iron on 12/19/11 for 30 tabs for TID PRN.

## 2012-01-05 NOTE — Telephone Encounter (Signed)
Fine will give him a prescription for Flexeril.

## 2012-01-05 NOTE — Telephone Encounter (Signed)
Has he ever been prescribed Flexeril before especially by [place]?

## 2012-01-05 NOTE — Addendum Note (Signed)
Addended by: Hassan Rowan on: 01/05/2012 05:26 PM   Modules accepted: Orders

## 2012-01-11 ENCOUNTER — Encounter: Payer: Medicare Other | Admitting: Internal Medicine

## 2012-01-13 ENCOUNTER — Encounter: Payer: Self-pay | Admitting: Sports Medicine

## 2012-01-13 ENCOUNTER — Ambulatory Visit (INDEPENDENT_AMBULATORY_CARE_PROVIDER_SITE_OTHER): Payer: Medicare Other | Admitting: Sports Medicine

## 2012-01-13 ENCOUNTER — Other Ambulatory Visit: Payer: Self-pay | Admitting: *Deleted

## 2012-01-13 VITALS — BP 107/69 | HR 70 | Temp 96.9°F | Resp 18 | Wt 222.0 lb

## 2012-01-13 DIAGNOSIS — M25579 Pain in unspecified ankle and joints of unspecified foot: Secondary | ICD-10-CM

## 2012-01-13 DIAGNOSIS — IMO0001 Reserved for inherently not codable concepts without codable children: Secondary | ICD-10-CM

## 2012-01-13 MED ORDER — TRAMADOL-ACETAMINOPHEN 37.5-325 MG PO TABS: 1.0000 | ORAL_TABLET | Freq: Four times a day (QID) | ORAL | Status: AC | PRN

## 2012-01-13 NOTE — Assessment & Plan Note (Signed)
He will attempt to find his plain films, however didn't form if he can find in the next couple weeks, we will just repeat them. I will see him back when he is ready for this.

## 2012-01-13 NOTE — Progress Notes (Signed)
Patient ID: Benjamin Powell, male   DOB: 05/15/1945, 66 y.o.   MRN: [id number] Subjective:    CC: Followup back pain  HPI: I seen [name] 3 times so far for his back pain. I diagnosed him with her a lumbar strain. At the last visit for several trigger point injections. He has also doing home rehabilitation exercises.  We also the Flexeril, and Mobic. Overall he notes his approximately 90% better since day one.  He still has some ankle issues, and has some imaging somewhere in his house that he would like to go over, but is not yet found.  Past medical history, Surgical history, Family history, Social history, Allergies, and medications have been entered into the medical record, reviewed, and no changes needed.   Review of Systems: No fevers, chills, night sweats, weight loss, chest pain, or shortness of breath.   Objective:    General: Well Developed, well nourished, and in no acute distress.  Neuro: Alert and oriented x3, extra-ocular muscles intact.  HEENT: Normocephalic, atraumatic, pupils equal round reactive to light, neck supple, no masses, no lymphadenopathy, thyroid nonpalpable.  Skin: Warm and dry, no rashes. Back Exam:  Inspection: Unremarkable  Motion: Flexion 45 deg, Extension 45 deg, Side Bending to 45 deg bilaterally,  Rotation to 45 deg bilaterally  SLR laying: Negative  XSLR laying: Negative  Palpable tenderness: None. FABER: negative. Sensory change: Gross sensation intact to all lumbar and sacral dermatomes.  Reflexes: 2+ at both patellar tendons, 2+ at achilles tendons, Babinski's downgoing.  Strength at foot  Plantar-flexion: 5/5 Dorsi-flexion: 5/5 Eversion: 5/5 Inversion: 5/5  Leg strength  Quad: 5/5 Hamstring: 5/5 Hip flexor: 5/5 Hip abductors: 5/5  Gait unremarkable.   Impression and Recommendations:

## 2012-01-13 NOTE — Assessment & Plan Note (Signed)
90% improved after predilatation, Mobic, Flexeril, and injection therapy. He may come back to see me on an as needed basis for this.

## 2012-01-27 ENCOUNTER — Ambulatory Visit (INDEPENDENT_AMBULATORY_CARE_PROVIDER_SITE_OTHER): Payer: Medicare Other | Admitting: Sports Medicine

## 2012-01-27 ENCOUNTER — Other Ambulatory Visit: Payer: Self-pay | Admitting: *Deleted

## 2012-01-27 VITALS — BP 122/82 | HR 55

## 2012-01-27 DIAGNOSIS — Z23 Encounter for immunization: Secondary | ICD-10-CM

## 2012-01-27 MED ORDER — ESOMEPRAZOLE MAGNESIUM 40 MG PO PACK: 40.0000 mg | PACK | Freq: Every day | ORAL | Status: DC

## 2012-01-27 NOTE — Progress Notes (Signed)
  Subjective:    Patient ID: Benjamin Powell, male    DOB: 09-Jun-1945, 66 y.o.   MRN: [id number] Shingles vaccine given HPI    Review of Systems     Objective:   Physical Exam        Assessment & Plan:

## 2012-02-03 ENCOUNTER — Ambulatory Visit (AMBULATORY_SURGERY_CENTER): Payer: Medicare Other | Admitting: *Deleted

## 2012-02-03 VITALS — Ht 72.0 in | Wt 223.0 lb

## 2012-02-03 DIAGNOSIS — Z1211 Encounter for screening for malignant neoplasm of colon: Secondary | ICD-10-CM

## 2012-02-03 MED ORDER — MOVIPREP 100 G PO SOLR: ORAL | Status: DC

## 2012-02-10 ENCOUNTER — Telehealth: Payer: Self-pay | Admitting: *Deleted

## 2012-02-10 MED ORDER — OMEPRAZOLE 40 MG PO CPDR: 40.0000 mg | DELAYED_RELEASE_CAPSULE | Freq: Every day | ORAL | Status: DC

## 2012-02-10 NOTE — Telephone Encounter (Signed)
Pt states the Nexium we called in is to expensive and would like to know if we can call in something different.

## 2012-02-10 NOTE — Telephone Encounter (Signed)
Pt notified med sent

## 2012-02-10 NOTE — Telephone Encounter (Signed)
Omeprazole called in let's try this.

## 2012-02-17 ENCOUNTER — Ambulatory Visit (AMBULATORY_SURGERY_CENTER): Payer: Medicare Other | Admitting: Gastroenterology

## 2012-02-17 ENCOUNTER — Encounter: Payer: Self-pay | Admitting: Gastroenterology

## 2012-02-17 VITALS — BP 156/89 | HR 47 | Temp 97.7°F | Resp 23 | Ht 72.0 in | Wt 223.0 lb

## 2012-02-17 DIAGNOSIS — D126 Benign neoplasm of colon, unspecified: Secondary | ICD-10-CM

## 2012-02-17 DIAGNOSIS — Z1211 Encounter for screening for malignant neoplasm of colon: Secondary | ICD-10-CM

## 2012-02-17 MED ORDER — SODIUM CHLORIDE 0.9 % IV SOLN: 500.0000 mL | INTRAVENOUS | Status: DC

## 2012-02-17 NOTE — Progress Notes (Signed)
Pressure applied to abdomen to visualize cecum.

## 2012-02-17 NOTE — Op Note (Signed)
[facility] [address] [place], [address]   COLONOSCOPY PROCEDURE REPORT  PATIENT: Shaymus, Eveleth  MR#: [id number] BIRTHDATE: May 12, 1945 , 66  yrs. old GENDER: Male ENDOSCOPIST: Meryl Dare, MD, [facility] REFERRED BY:   Rodney Langton, MD PROCEDURE DATE:  02/17/2012 PROCEDURE:   Colonoscopy with snare polypectomy ASA CLASS:   Class II INDICATIONS:average risk screening. MEDICATIONS: MAC sedation, administered by CRNA and propofol (Diprivan) 350mg  IV DESCRIPTION OF PROCEDURE:   After the risks benefits and alternatives of the procedure were thoroughly explained, informed consent was obtained.  A digital rectal exam revealed no abnormalities of the rectum.   The LB [id number]  endoscope was introduced through the anus and advanced to the cecum, which was identified by both the appendix and ileocecal valve. No adverse events experienced.   The quality of the prep was adequate, using MoviPrep  The instrument was then slowly withdrawn as the colon was fully examined.  COLON FINDINGS: A sessile polyp measuring 7 mm in size was found in the transverse colon.  A polypectomy was performed with a cold snare.  The resection was complete and the polyp tissue was completely retrieved.   A sessile polyp measuring 6 mm in size was found in the descending colon.  A polypectomy was performed with a cold snare.  The resection was complete and the polyp tissue was completely retrieved.   Three sessile polyps measuring 6-7 mm in size were found in the sigmoid colon.  A polypectomy was performed with a cold snare.  The resection was complete and the polyp tissue was completely retrieved.   Two sessile polyps measuring 5 mm in size were found in the rectum.  A polypectomy was performed with a cold snare.  The resection was complete and the polyp tissue was completely retrieved.   The colon was otherwise normal.  There was no diverticulosis, inflammation, polyps or  cancers unless previously stated.  Retroflexed views revealed small internal hemorrhoids. The time to cecum=2 minutes 11 seconds.  Withdrawal time=18 minutes 21 seconds.  The scope was withdrawn and the procedure completed. COMPLICATIONS: There were no complications.  ENDOSCOPIC IMPRESSION: 1.   Sessile polyp in the transverse colon; polypectomy was performed with a cold snare 2.   Sessile polyp in the descending colon; polypectomy was performed with a cold snare 3.   Three sessile polyps  in the sigmoid colon; polypectomy was performed with a cold snare 4.   Two sessile polyps in the rectum; polypectomy was performed with a cold snare 5.   Small internal hemorrhoids  RECOMMENDATIONS: 1.  await pathology results 2.  repeat Colonoscopy in 3 years if 3 or more polyps are adenomatous, 5 years if 1 or 2 adenomatous, otherwise 10 years eSigned:  Meryl Dare, MD, [facility] 02/17/2012 11:51 AM     PATIENT NAME:  Kurtis, Anastasia MR#: [id number]

## 2012-02-17 NOTE — Patient Instructions (Addendum)
Discharge instructions given with verbal understanding. Handouts on polyps and hemorrhoids given. Resume previous medications. YOU HAD AN ENDOSCOPIC PROCEDURE TODAY AT THE [facility]: Refer to the procedure report that was given to you for any specific questions about what was found during the examination.  If the procedure report does not answer your questions, please call your gastroenterologist to clarify.  If you requested that your care partner not be given the details of your procedure findings, then the procedure report has been included in a sealed envelope for you to review at your convenience later.  YOU SHOULD EXPECT: Some feelings of bloating in the abdomen. Passage of more gas than usual.  Walking can help get rid of the air that was put into your GI tract during the procedure and reduce the bloating. If you had a lower endoscopy (such as a colonoscopy or flexible sigmoidoscopy) you may notice spotting of blood in your stool or on the toilet paper. If you underwent a bowel prep for your procedure, then you may not have a normal bowel movement for a few days.  DIET: Your first meal following the procedure should be a light meal and then it is ok to progress to your normal diet.  A half-sandwich or bowl of soup is an example of a good first meal.  Heavy or fried foods are harder to digest and may make you feel nauseous or bloated.  Likewise meals heavy in dairy and vegetables can cause extra gas to form and this can also increase the bloating.  Drink plenty of fluids but you should avoid alcoholic beverages for 24 hours.  ACTIVITY: Your care partner should take you home directly after the procedure.  You should plan to take it easy, moving slowly for the rest of the day.  You can resume normal activity the day after the procedure however you should NOT DRIVE or use heavy machinery for 24 hours (because of the sedation medicines used during the test).    SYMPTOMS TO REPORT  IMMEDIATELY: A gastroenterologist can be reached at any hour.  During normal business hours, 8:30 AM to 5:00 PM Monday through Friday, call [phone].  After hours and on weekends, please call the GI answering service at [phone] who will take a message and have the physician on call contact you.   Following lower endoscopy (colonoscopy or flexible sigmoidoscopy):  Excessive amounts of blood in the stool  Significant tenderness or worsening of abdominal pains  Swelling of the abdomen that is new, acute  Fever of 100F or higher FOLLOW UP: If any biopsies were taken you will be contacted by phone or by letter within the next 1-3 weeks.  Call your gastroenterologist if you have not heard about the biopsies in 3 weeks.  Our staff will call the home number listed on your records the next business day following your procedure to check on you and address any questions or concerns that you may have at that time regarding the information given to you following your procedure. This is a courtesy call and so if there is no answer at the home number and we have not heard from you through the emergency physician on call, we will assume that you have returned to your regular daily activities without incident.  SIGNATURES/CONFIDENTIALITY: You and/or your care partner have signed paperwork which will be entered into your electronic medical record.  These signatures attest to the fact that that the information above on your After Visit Summary   has been reviewed and is understood.  Full responsibility of the confidentiality of this discharge information lies with you and/or your care-partner.  

## 2012-02-20 ENCOUNTER — Telehealth: Payer: Self-pay

## 2012-02-20 NOTE — Telephone Encounter (Signed)
  Follow up Call-  Call back number 02/17/2012  Post procedure Call Back phone  # [phone]  Permission to leave phone message Yes     Patient questions:  Do you have a fever, pain , or abdominal swelling? no Pain Score  0 *  Have you tolerated food without any problems? yes  Have you been able to return to your normal activities? yes  Do you have any questions about your discharge instructions: Diet   no Medications  no Follow up visit  no  Do you have questions or concerns about your Care? no  Actions: * If pain score is 4 or above: No action needed, pain <4.

## 2012-02-22 ENCOUNTER — Encounter: Payer: Self-pay | Admitting: Gastroenterology

## 2012-03-06 ENCOUNTER — Ambulatory Visit: Payer: Medicare Other | Admitting: Family Medicine

## 2012-03-06 DIAGNOSIS — Z0289 Encounter for other administrative examinations: Secondary | ICD-10-CM

## 2012-06-23 ENCOUNTER — Other Ambulatory Visit: Payer: Self-pay

## 2012-12-12 ENCOUNTER — Other Ambulatory Visit: Payer: Self-pay

## 2013-01-01 ENCOUNTER — Encounter: Payer: Self-pay | Admitting: Family Medicine

## 2013-01-01 ENCOUNTER — Ambulatory Visit (INDEPENDENT_AMBULATORY_CARE_PROVIDER_SITE_OTHER): Payer: Medicare Other | Admitting: Family Medicine

## 2013-01-01 VITALS — BP 123/78 | HR 50 | Wt 230.0 lb

## 2013-01-01 DIAGNOSIS — E785 Hyperlipidemia, unspecified: Secondary | ICD-10-CM

## 2013-01-01 DIAGNOSIS — Z13220 Encounter for screening for lipoid disorders: Secondary | ICD-10-CM

## 2013-01-01 DIAGNOSIS — H538 Other visual disturbances: Secondary | ICD-10-CM

## 2013-01-01 DIAGNOSIS — I10 Essential (primary) hypertension: Secondary | ICD-10-CM

## 2013-01-01 DIAGNOSIS — E119 Type 2 diabetes mellitus without complications: Secondary | ICD-10-CM

## 2013-01-01 MED ORDER — OMEPRAZOLE 40 MG PO CPDR: 40.0000 mg | DELAYED_RELEASE_CAPSULE | Freq: Every day | ORAL | Status: DC

## 2013-01-01 MED ORDER — CYCLOBENZAPRINE HCL 5 MG PO TABS: 5.0000 mg | ORAL_TABLET | Freq: Three times a day (TID) | ORAL | Status: AC | PRN

## 2013-01-01 MED ORDER — CLONIDINE HCL 0.3 MG PO TABS: 0.3000 mg | ORAL_TABLET | Freq: Two times a day (BID) | ORAL | Status: DC

## 2013-01-01 MED ORDER — METOPROLOL TARTRATE 100 MG PO TABS: 100.0000 mg | ORAL_TABLET | Freq: Two times a day (BID) | ORAL | Status: DC

## 2013-01-01 MED ORDER — METFORMIN HCL 500 MG PO TABS: 1000.0000 mg | ORAL_TABLET | Freq: Two times a day (BID) | ORAL | Status: DC

## 2013-01-01 MED ORDER — CITALOPRAM HYDROBROMIDE 20 MG PO TABS: 20.0000 mg | ORAL_TABLET | Freq: Every day | ORAL | Status: DC

## 2013-01-01 MED ORDER — LISINOPRIL 40 MG PO TABS: 40.0000 mg | ORAL_TABLET | Freq: Every day | ORAL | Status: DC

## 2013-01-01 MED ORDER — HYDROCHLOROTHIAZIDE 25 MG PO TABS: 25.0000 mg | ORAL_TABLET | Freq: Every day | ORAL | Status: DC

## 2013-01-01 MED ORDER — AMLODIPINE BESYLATE 10 MG PO TABS: 10.0000 mg | ORAL_TABLET | Freq: Every day | ORAL | Status: DC

## 2013-01-01 NOTE — Progress Notes (Signed)
CC: Benjamin Powell is a 67 y.o. male is here for Hypertension and Diabetes   Subjective: HPI:  Followup type 2 diabetes: No outside blood sugars to report. Denies poorly healing wounds, motor or sensory disturbances (other than that described below), chest pain, shortness of breath, polyuria polyphasia or polydipsia. Taking metformin 500 mg twice a day without GI disturbance  Followup hypertension: Continues on amlodipine, clonidine, hydrochlorothiazide, lisinopril, metoprolol. Blood pressures at home consistently below 140/90. Denies exertional chest pain and claudication shortness of breath orthopnea nor PND  Followup depression: Continues on Celexa on a daily basis without known side effects symptoms seem to be worse during periods of unemployment. 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 Currently symptoms are mild not interfering with quality of life. Denies anxiety or mental disturbance  Patient complains of a stroke light sensation in his left peripheral vision that occurred intermittently during the month of June. He believes it may have happened once or twice since then he's unsure dates. He denies vision loss other than above. He's never had this before. This has occurred at all times of the day nothing particularly caused a nothing particularly makes better or worse it lasted minutes to hours. Denies ocular pain    Review Of Systems Outlined In HPI  Past Medical History  Diagnosis Date  . Hypertension   . Diabetes mellitus   . Allergy     seasonal  . GERD (gastroesophageal reflux disease)   . Anxiety   . Sleep apnea      Family History  Problem Relation Age of Onset  . Stroke Brother   . Colitis Brother   . Pancreatic cancer Mother   . Colitis Sister      History  Substance Use Topics  . Smoking status: Current Every Day Smoker -- 0.30 packs/day    Types: Cigarettes  . Smokeless tobacco: Never Used  . Alcohol Use: No      Objective: Filed Vitals:   01/01/13 0955  BP: 123/78  Pulse: 50    General: Alert and Oriented, No Acute Distress HEENT: Pupils equal, round, reactive to light. Conjunctivae clear.  External ears unremarkable, canals clear with intact TMs with appropriate landmarks.  Middle ear appears open without effusion. Pink inferior turbinates.  Moist mucous membranes, pharynx without inflammation nor lesions.  Neck supple without palpable lymphadenopathy nor abnormal masses. Lungs: Clear to auscultation bilaterally, no wheezing/ronchi/rales.  Comfortable work of breathing. Good air movement. Cardiac: Regular rate and rhythm. Normal S1/S2.  No murmurs, rubs, nor gallops.   Diabetic Foot Exam: Dorsalis pedis pulses 1+ bilaterally.  Monofilament sensation intact on plantar and dorsal surface bilaterally.  No signs of infection, skin breakdown, nor ulceration. Extremities: No peripheral edema.  Strong peripheral pulses.  Mental Status: No depression, anxiety, nor agitation. Skin: Warm and dry.  Assessment & Plan: Cortney was seen today for hypertension and diabetes.  Diagnoses and associated orders for this visit:  Type 2 diabetes mellitus - POCT HgB A1C - Microalbumin / creatinine urine ratio - metFORMIN (GLUCOPHAGE) 500 MG tablet; Take 2 tablets (1,000 mg total) by mouth 2 (two) times daily with a meal.  Hypertension - BASIC METABOLIC PANEL WITH GFR - amLODipine (NORVASC) 10 MG tablet; Take 1 tablet (10 mg total) by mouth daily. - cloNIDine (CATAPRES) 0.3 MG tablet; Take 1 tablet (0.3 mg total) by mouth 2 (two) times daily. - hydrochlorothiazide (HYDRODIURIL) 25 MG tablet; Take 1 tablet (25 mg total) by mouth daily. - lisinopril (PRINIVIL,ZESTRIL) 40 MG tablet;  Take 1 tablet (40 mg total) by mouth daily. - metoprolol (LOPRESSOR) 100 MG tablet; Take 1 tablet (100 mg total) by mouth 2 (two) times daily.  Lipid screening  Flashing lights seen - Ambulatory referral to  Ophthalmology  Hyperlipidemia LDL goal < 100 - Lipid panel  Other Orders - citalopram (CELEXA) 20 MG tablet; Take 1 tablet (20 mg total) by mouth daily. - cyclobenzaprine (FLEXERIL) 5 MG tablet; Take 1 tablet (5 mg total) by mouth every 8 (eight) hours as needed for muscle spasms. - omeprazole (PRILOSEC) 40 MG capsule; Take 1 capsule (40 mg total) by mouth daily. Take at dinnertime.    Type 2 diabetes: Uncontrolled chronic condition A1c 7.6 increasing metformin repeat 3 months. Continue aspirin check and cholesterol today History of hyperlipidemia: Due for cholesterol check Flashing lights: Concern for possibility of vitreous detachment therefore urgent referral to ophthalmology done today Hypertension: Controlled chronic condition continue current medication regimen Depression: Controlled chronic condition continue citalopram  Return in about 3 months (around 04/03/2013).

## 2013-01-02 LAB — LIPID PANEL
Cholesterol: 130 mg/dL (ref 0–200)
HDL: 32 mg/dL — ABNORMAL LOW (ref >39)
LDL Cholesterol: 72 mg/dL (ref 0–99)
Triglycerides: 129 mg/dL (ref <150)
VLDL: 26 mg/dL (ref 0–40)

## 2013-01-02 LAB — BASIC METABOLIC PANEL WITH GFR
BUN: 19 mg/dL (ref 6–23)
CO2: 30 mEq/L (ref 19–32)
Calcium: 9.7 mg/dL (ref 8.4–10.5)
Creat: 1.13 mg/dL (ref 0.50–1.35)
GFR, Est Non African American: 67 mL/min

## 2013-01-03 ENCOUNTER — Encounter: Payer: Self-pay | Admitting: Family Medicine

## 2013-01-03 DIAGNOSIS — E1121 Type 2 diabetes mellitus with diabetic nephropathy: Secondary | ICD-10-CM | POA: Insufficient documentation

## 2013-01-18 ENCOUNTER — Encounter: Payer: Self-pay | Admitting: Family Medicine

## 2013-01-18 DIAGNOSIS — H43812 Vitreous degeneration, left eye: Secondary | ICD-10-CM | POA: Insufficient documentation

## 2013-02-15 ENCOUNTER — Encounter: Payer: Self-pay | Admitting: Family Medicine

## 2013-02-15 ENCOUNTER — Ambulatory Visit (INDEPENDENT_AMBULATORY_CARE_PROVIDER_SITE_OTHER): Payer: Medicare Other | Admitting: Family Medicine

## 2013-02-15 VITALS — BP 148/91 | HR 55 | Wt 227.0 lb

## 2013-02-15 DIAGNOSIS — E119 Type 2 diabetes mellitus without complications: Secondary | ICD-10-CM

## 2013-02-15 MED ORDER — SITAGLIP PHOS-METFORMIN HCL ER 50-1000 MG PO TB24: 1.0000 | ORAL_TABLET | Freq: Every day | ORAL | Status: DC

## 2013-02-15 NOTE — Progress Notes (Signed)
CC: Benjamin Powell is a 67 y.o. male is here for abd pain and diarrhea   Subjective: HPI:  Patient complains of worsening loose stools and fecal urgency that has been present for the past 2 months present on a daily basis stool is described as loose nonbloody without black appearance. Bowel movements can occur anytime of the day, he denies fecal incontinence. Symptoms began soon after increasing metformin,  Interestingly he cut back on his metformin and symptoms slightly improved. He reports an upset stomach described as mild epigastric pain nothing particularly makes better or worse comes and goes throughout the day. He denies pelvic pain, fevers, chills, back pain,urinary complaints, nausea, vomiting, weight loss   Review Of Systems Outlined In HPI  Past Medical History  Diagnosis Date  . Hypertension   . Diabetes mellitus   . Allergy     seasonal  . GERD (gastroesophageal reflux disease)   . Anxiety   . Sleep apnea      Family History  Problem Relation Age of Onset  . Stroke Brother   . Colitis Brother   . Pancreatic cancer Mother   . Colitis Sister      History  Substance Use Topics  . Smoking status: Current Every Day Smoker -- 0.30 packs/day    Types: Cigarettes  . Smokeless tobacco: Never Used  . Alcohol Use: No     Objective: Filed Vitals:   02/15/13 1525  BP: 148/91  Pulse: 55    General: Alert and Oriented, No Acute Distress HEENT: Pupils equal, round, reactive to light. Conjunctivae clear.  Moist mucous membranes pharynx unremarkable Lungs: Clear to auscultation bilaterally, no wheezing/ronchi/rales.  Comfortable work of breathing. Good air movement. Cardiac: Regular rate and rhythm. Normal S1/S2.  No murmurs, rubs, nor gallops.   Abdomen: Normal bowel sounds, soft and non tender without palpable masses. Extremities: No peripheral edema.  Strong peripheral pulses.  Mental Status: No depression, anxiety, nor agitation. Skin: Warm and dry.  Assessment &  Plan: [name] was seen today for abd pain and diarrhea.  Diagnoses and associated orders for this visit:  Type 2 diabetes mellitus  Other Orders - SitaGLIPtin-MetFORMIN HCl (JANUMET XR) 50-1000 MG TB24; Take 1 tablet by mouth at bedtime.    Discussed with patient my suspicion that increase metformin dosage is the pure caused his diarrhea he will stop metformin we will switch to Janumet XR using his former dose of metformin.  Return in about 3 months (around 05/18/2013).

## 2013-02-20 ENCOUNTER — Emergency Department (INDEPENDENT_AMBULATORY_CARE_PROVIDER_SITE_OTHER)
Admission: EM | Admit: 2013-02-20 | Discharge: 2013-02-20 | Disposition: A | Discharge status: Home / Self Care | Payer: Medicare Other | Source: Home / Self Care | Attending: Family Medicine | Admitting: Family Medicine

## 2013-02-20 ENCOUNTER — Encounter: Payer: Self-pay | Admitting: Emergency Medicine

## 2013-02-20 DIAGNOSIS — S61209A Unspecified open wound of unspecified finger without damage to nail, initial encounter: Secondary | ICD-10-CM

## 2013-02-20 DIAGNOSIS — S61213A Laceration without foreign body of left middle finger without damage to nail, initial encounter: Secondary | ICD-10-CM

## 2013-02-20 NOTE — ED Notes (Signed)
Pt c/o LT 3rd digit laceration x 1 hr ago, cut on a can at home. He reports having a tdap approximately 1 year ago.

## 2013-02-20 NOTE — ED Provider Notes (Addendum)
CSN: [id number]     Arrival date & time 02/20/13  1821 History   First MD Initiated Contact with Patient 02/20/13 1828     Chief Complaint  Patient presents with  . Extremity Laceration      HPI Comments: Patient cut his left third finger on the lid of a food can today.    Patient is a 67 y.o. male presenting with skin laceration. The history is provided by the patient and the spouse.  Laceration Location:  Finger Finger laceration location:  L middle finger Length (cm):  1.5 Depth:  Through dermis Quality: straight   Bleeding: controlled   Time since incident:  1 hour Laceration mechanism:  Metal edge Pain details:    Quality:  Aching   Severity:  Mild   Timing:  Constant Foreign body present:  No foreign bodies Relieved by:  None tried Tetanus status:  Up to date   Past Medical History  Diagnosis Date  . Hypertension   . Diabetes mellitus   . Allergy     seasonal  . GERD (gastroesophageal reflux disease)   . Anxiety   . Sleep apnea    Past Surgical History  Procedure Laterality Date  . Ankle surgery  1993    left  . Tonsillectomy and adenoidectomy  1998    and sinus for sleep apnea  . Cholecystectomy  1990  . Ercp  1990   Family History  Problem Relation Age of Onset  . Stroke Brother   . Colitis Brother   . Pancreatic cancer Mother   . Colitis Sister    History  Substance Use Topics  . Smoking status: Current Every Day Smoker -- 0.30 packs/day    Types: Cigarettes  . Smokeless tobacco: Never Used  . Alcohol Use: No    Review of Systems  All other systems reviewed and are negative.    Allergies  Review of patient's allergies indicates no known allergies.  Home Medications   Current Outpatient Rx  Name  Route  Sig  Dispense  Refill  . amLODipine (NORVASC) 10 MG tablet   Oral   Take 1 tablet (10 mg total) by mouth daily.   90 tablet   3   . aspirin 81 MG tablet   Oral   Take 81 mg by mouth daily.         . citalopram (CELEXA) 20  MG tablet   Oral   Take 1 tablet (20 mg total) by mouth daily.   90 tablet   3   . cloNIDine (CATAPRES) 0.3 MG tablet   Oral   Take 1 tablet (0.3 mg total) by mouth 2 (two) times daily.   180 tablet   3   . cyclobenzaprine (FLEXERIL) 5 MG tablet   Oral   Take 1 tablet (5 mg total) by mouth every 8 (eight) hours as needed for muscle spasms.   60 tablet   1   . hydrochlorothiazide (HYDRODIURIL) 25 MG tablet   Oral   Take 1 tablet (25 mg total) by mouth daily.   90 tablet   3   . lisinopril (PRINIVIL,ZESTRIL) 40 MG tablet   Oral   Take 1 tablet (40 mg total) by mouth daily.   90 tablet   3   . metoprolol (LOPRESSOR) 100 MG tablet   Oral   Take 1 tablet (100 mg total) by mouth 2 (two) times daily.   180 tablet   3   . omeprazole (PRILOSEC) 40 MG  capsule   Oral   Take 1 capsule (40 mg total) by mouth daily. Take at dinnertime.   90 capsule   3   . SitaGLIPtin-MetFORMIN HCl (JANUMET XR) 50-1000 MG TB24   Oral   Take 1 tablet by mouth at bedtime.   30 tablet   3    BP 149/90  Pulse 60  Temp(Src) 97.7 F (36.5 C) (Oral)  Resp 18  SpO2 96% Physical Exam  Nursing note and vitals reviewed. Constitutional: He is oriented to person, place, and time. He appears well-developed and well-nourished. No distress.  HENT:  Head: Atraumatic.  Eyes: Conjunctivae are normal. Pupils are equal, round, and reactive to light.  Musculoskeletal:       Left hand: He exhibits laceration. He exhibits normal range of motion, no tenderness, no bony tenderness, normal two-point discrimination, normal capillary refill, no deformity and no swelling. Normal sensation noted. Normal strength noted.       Hands: Left third finger has a superficial linear 1.5cm laceration on the radial aspect of the proximal phalanx.  Finger has full range of motion all joints.  Distal neurovascular function is intact.   Neurological: He is alert and oriented to person, place, and time.  Skin: Skin is warm and  dry.    ED Course  Procedures  Laceration Repair Discussed benefits and risks of procedure and verbal consent obtained. Using sterile technique and digital 2% lidocaine without epinephrine, cleansed wound with Betadine followed by copious lavage with normal saline.  Wound carefully inspected for debris and foreign bodies; none found.  Wound closed with #4, 4-0 interrupted nylon sutures.  Bacitracin and non-stick sterile dressing applied.  Wound precautions explained to patient.  Return for suture removal in 10 days.        MDM   1. Laceration of middle finger of left hand without complication, initial encounter     Change dressing daily and apply Bacitracin ointment to wound.  Keep wound clean and dry.  Return for any signs of infection (or follow-up with family doctor):  Increasing redness, swelling, pain, heat, drainage, etc. Return in 10 days for suture removal.    Lattie Haw, MD 02/20/13 1949  Lattie Haw, MD 02/20/13 [phone]

## 2013-02-27 ENCOUNTER — Telehealth: Payer: Self-pay | Admitting: Family Medicine

## 2013-02-27 NOTE — Telephone Encounter (Signed)
Patient has a question about his Metformin

## 2013-02-28 NOTE — Telephone Encounter (Signed)
Left message on vm to call back and leave details about what his question is

## 2013-03-01 ENCOUNTER — Telehealth: Payer: Self-pay | Admitting: *Deleted

## 2013-03-01 NOTE — Telephone Encounter (Signed)
Benjamin Powell, Can you please recommend that he stop janumet and not take anything for diabetes for the next week.  Follow up with me sometime late next week to determine a game plan on either further workup of switching diabetic medication based on his response.

## 2013-03-01 NOTE — Telephone Encounter (Signed)
Pt states he is still having diarrhea even after switching from metformin to janumet.please advise

## 2013-03-02 ENCOUNTER — Emergency Department (INDEPENDENT_AMBULATORY_CARE_PROVIDER_SITE_OTHER)
Admission: EM | Admit: 2013-03-02 | Discharge: 2013-03-02 | Disposition: A | Discharge status: Home / Self Care | Payer: Medicare Other | Source: Home / Self Care | Attending: Family Medicine | Admitting: Family Medicine

## 2013-03-02 ENCOUNTER — Encounter: Payer: Self-pay | Admitting: Emergency Medicine

## 2013-03-02 DIAGNOSIS — S61209A Unspecified open wound of unspecified finger without damage to nail, initial encounter: Secondary | ICD-10-CM

## 2013-03-02 DIAGNOSIS — L089 Local infection of the skin and subcutaneous tissue, unspecified: Secondary | ICD-10-CM

## 2013-03-02 MED ORDER — DOXYCYCLINE HYCLATE 100 MG PO CAPS: 100.0000 mg | ORAL_CAPSULE | Freq: Two times a day (BID) | ORAL | Status: DC

## 2013-03-02 MED ORDER — MUPIROCIN 2 % EX OINT: TOPICAL_OINTMENT | Freq: Three times a day (TID) | CUTANEOUS | Status: DC

## 2013-03-02 NOTE — ED Notes (Signed)
Pt complains of redness and swelling around suture site. Pt states he was suppose to get sutures removed today(10.25.14) or tomorrow(10.26.14). Pt denies chills, fever and sweats.

## 2013-03-02 NOTE — ED Provider Notes (Signed)
CSN: [id number]     Arrival date & time 03/02/13  1215 History   First MD Initiated Contact with Patient 03/02/13 1331     Chief Complaint  Patient presents with  . Suture / Staple Removal    Redness/ swelling around site      HPI Comments: Patient returns for possible suture removal.  Over the past 2 days the area around his finger laceration has become red and tender, but without drainage.  The history is provided by the patient and the spouse.    Past Medical History  Diagnosis Date  . Hypertension   . Diabetes mellitus   . Allergy     seasonal  . GERD (gastroesophageal reflux disease)   . Anxiety   . Sleep apnea    Past Surgical History  Procedure Laterality Date  . Ankle surgery  1993    left  . Tonsillectomy and adenoidectomy  1998    and sinus for sleep apnea  . Cholecystectomy  1990  . Ercp  1990   Family History  Problem Relation Age of Onset  . Stroke Brother   . Colitis Brother   . Pancreatic cancer Mother   . Colitis Sister    History  Substance Use Topics  . Smoking status: Current Every Day Smoker -- 0.30 packs/day for 50 years    Types: Cigarettes  . Smokeless tobacco: Never Used  . Alcohol Use: No    Review of Systems  Constitutional: Negative.   Skin: Positive for color change.  All other systems reviewed and are negative.    Allergies  Review of patient's allergies indicates no known allergies.  Home Medications   Current Outpatient Rx  Name  Route  Sig  Dispense  Refill  . amLODipine (NORVASC) 10 MG tablet   Oral   Take 1 tablet (10 mg total) by mouth daily.   90 tablet   3   . aspirin 81 MG tablet   Oral   Take 81 mg by mouth daily.         . citalopram (CELEXA) 20 MG tablet   Oral   Take 1 tablet (20 mg total) by mouth daily.   90 tablet   3   . cloNIDine (CATAPRES) 0.3 MG tablet   Oral   Take 1 tablet (0.3 mg total) by mouth 2 (two) times daily.   180 tablet   3   . hydrochlorothiazide (HYDRODIURIL) 25 MG  tablet   Oral   Take 1 tablet (25 mg total) by mouth daily.   90 tablet   3   . lisinopril (PRINIVIL,ZESTRIL) 40 MG tablet   Oral   Take 1 tablet (40 mg total) by mouth daily.   90 tablet   3   . metoprolol (LOPRESSOR) 100 MG tablet   Oral   Take 1 tablet (100 mg total) by mouth 2 (two) times daily.   180 tablet   3   . SitaGLIPtin-MetFORMIN HCl (JANUMET XR) 50-1000 MG TB24   Oral   Take 1 tablet by mouth at bedtime.   30 tablet   3   . cyclobenzaprine (FLEXERIL) 5 MG tablet   Oral   Take 1 tablet (5 mg total) by mouth every 8 (eight) hours as needed for muscle spasms.   60 tablet   1   . doxycycline (VIBRAMYCIN) 100 MG capsule   Oral   Take 1 capsule (100 mg total) by mouth 2 (two) times daily.   14 capsule  0   . mupirocin ointment (BACTROBAN) 2 %   Topical   Apply topically 3 (three) times daily.   22 g   0   . omeprazole (PRILOSEC) 40 MG capsule   Oral   Take 1 capsule (40 mg total) by mouth daily. Take at dinnertime.   90 capsule   3    BP 151/72  Pulse 54  Temp(Src) 98 F (36.7 C) (Oral)  Ht 6' (1.829 m)  Wt 229 lb (103.874 kg)  BMI 31.05 kg/m2  SpO2 95% Physical Exam  Nursing note and vitals reviewed. Eyes: Conjunctivae are normal. Pupils are equal, round, and reactive to light.  Musculoskeletal:       Hands: Laceration left third finger has a halo of erythema about 5mm wide, with mild swelling.  No drainage from the wound, and no evidence of dehiscence    ED Course  Procedures  none       MDM   1. Infected finger laceration, subsequent encounter    Begin Bactroban ointment, and doxycycline for staph coverage. Change bandage and apply antibiotic ointment daily. Return in 4 to 5 days for suture removal    Lattie Haw, MD 03/03/13 2046

## 2013-03-04 NOTE — Telephone Encounter (Signed)
Left message on vm

## 2013-03-05 ENCOUNTER — Telehealth: Payer: Self-pay | Admitting: *Deleted

## 2013-03-05 NOTE — Telephone Encounter (Signed)
Pt called and left a message on my vm that he has been trying to get in touch with me but hasnt been able to.  He states he wanted a rx for colitis. I called him back and left another message on his vm restating what I had left previously on his vm. (see phone note from yesterday) I stated if he had other concerns to please call and leave a DETAILED message on vm with what his concerns are because I have not received any other calls from him

## 2013-03-07 ENCOUNTER — Emergency Department
Admission: EM | Admit: 2013-03-07 | Discharge: 2013-03-07 | Disposition: A | Discharge status: Home / Self Care | Payer: Medicare Other | Source: Home / Self Care | Attending: Family Medicine | Admitting: Family Medicine

## 2013-03-07 DIAGNOSIS — Z4802 Encounter for removal of sutures: Secondary | ICD-10-CM

## 2013-03-07 NOTE — ED Provider Notes (Signed)
CSN: [id number]     Arrival date & time 03/07/13  1824 History   First MD Initiated Contact with Patient 03/07/13 1830     Chief Complaint  Patient presents with  . Suture / Staple Removal     HPI Comments: Patient returns for suture removal.  He reports that he has had no further pain, redness, or swelling of his finger.  He states that there has been a small amount of drainage on bandage.  The history is provided by the patient.    Past Medical History  Diagnosis Date  . Hypertension   . Diabetes mellitus   . Allergy     seasonal  . GERD (gastroesophageal reflux disease)   . Anxiety   . Sleep apnea    Past Surgical History  Procedure Laterality Date  . Ankle surgery  1993    left  . Tonsillectomy and adenoidectomy  1998    and sinus for sleep apnea  . Cholecystectomy  1990  . Ercp  1990   Family History  Problem Relation Age of Onset  . Stroke Brother   . Colitis Brother   . Pancreatic cancer Mother   . Colitis Sister    History  Substance Use Topics  . Smoking status: Current Every Day Smoker -- 0.30 packs/day for 50 years    Types: Cigarettes  . Smokeless tobacco: Never Used  . Alcohol Use: No    Review of Systems  Constitutional: Negative for fever and chills.  Musculoskeletal:       No pain or drainage from wound left third finger    Allergies  Review of patient's allergies indicates no known allergies.  Home Medications   Current Outpatient Rx  Name  Route  Sig  Dispense  Refill  . amLODipine (NORVASC) 10 MG tablet   Oral   Take 1 tablet (10 mg total) by mouth daily.   90 tablet   3   . aspirin 81 MG tablet   Oral   Take 81 mg by mouth daily.         . citalopram (CELEXA) 20 MG tablet   Oral   Take 1 tablet (20 mg total) by mouth daily.   90 tablet   3   . cloNIDine (CATAPRES) 0.3 MG tablet   Oral   Take 1 tablet (0.3 mg total) by mouth 2 (two) times daily.   180 tablet   3   . cyclobenzaprine (FLEXERIL) 5 MG tablet   Oral  Take 1 tablet (5 mg total) by mouth every 8 (eight) hours as needed for muscle spasms.   60 tablet   1   . doxycycline (VIBRAMYCIN) 100 MG capsule   Oral   Take 1 capsule (100 mg total) by mouth 2 (two) times daily.   14 capsule   0   . hydrochlorothiazide (HYDRODIURIL) 25 MG tablet   Oral   Take 1 tablet (25 mg total) by mouth daily.   90 tablet   3   . lisinopril (PRINIVIL,ZESTRIL) 40 MG tablet   Oral   Take 1 tablet (40 mg total) by mouth daily.   90 tablet   3   . metoprolol (LOPRESSOR) 100 MG tablet   Oral   Take 1 tablet (100 mg total) by mouth 2 (two) times daily.   180 tablet   3   . mupirocin ointment (BACTROBAN) 2 %   Topical   Apply topically 3 (three) times daily.   22 g  0   . omeprazole (PRILOSEC) 40 MG capsule   Oral   Take 1 capsule (40 mg total) by mouth daily. Take at dinnertime.   90 capsule   3   . SitaGLIPtin-MetFORMIN HCl (JANUMET XR) 50-1000 MG TB24   Oral   Take 1 tablet by mouth at bedtime.   30 tablet   3    BP 165/89  Pulse 60  Temp(Src) 98.3 F (36.8 C) (Oral)  Resp 14  SpO2 97% Physical Exam Nursing notes and Vital Signs reviewed. Appearance:  Patient appears comfortable and alert Left third finger:  full range of motion all joints.  Laceration appears well healed without swelling, erythema, or tenderness.  Sutures removed.   ED Course  Procedures  none       MDM   1. Visit for suture removal.  Wound healed without evidence cellulitis   Bacitracin and bandage applied Apply Bacitracin and bandage daily until healed    Lattie Haw, MD 03/07/13 [phone]

## 2013-03-07 NOTE — ED Notes (Signed)
Benjamin Powell is here for suture removal from left 3rd finger. Site is dry.

## 2013-03-11 ENCOUNTER — Telehealth: Payer: Self-pay | Admitting: Emergency Medicine

## 2013-03-14 ENCOUNTER — Other Ambulatory Visit: Payer: Self-pay

## 2013-04-03 ENCOUNTER — Ambulatory Visit: Payer: Medicare Other | Admitting: Family Medicine

## 2013-04-03 DIAGNOSIS — Z0289 Encounter for other administrative examinations: Secondary | ICD-10-CM

## 2013-06-14 ENCOUNTER — Encounter: Payer: Self-pay | Admitting: Family Medicine

## 2013-06-14 ENCOUNTER — Ambulatory Visit (INDEPENDENT_AMBULATORY_CARE_PROVIDER_SITE_OTHER): Payer: Medicare Other | Admitting: Family Medicine

## 2013-06-14 VITALS — BP 138/90 | HR 56 | Wt 223.0 lb

## 2013-06-14 DIAGNOSIS — F17200 Nicotine dependence, unspecified, uncomplicated: Secondary | ICD-10-CM

## 2013-06-14 DIAGNOSIS — E119 Type 2 diabetes mellitus without complications: Secondary | ICD-10-CM

## 2013-06-14 DIAGNOSIS — IMO0001 Reserved for inherently not codable concepts without codable children: Secondary | ICD-10-CM

## 2013-06-14 DIAGNOSIS — F411 Generalized anxiety disorder: Secondary | ICD-10-CM

## 2013-06-14 DIAGNOSIS — I10 Essential (primary) hypertension: Secondary | ICD-10-CM

## 2013-06-14 LAB — HEMOGLOBIN A1C
Hgb A1c MFr Bld: 7.2 % — ABNORMAL HIGH (ref <5.7)
Mean Plasma Glucose: 160 mg/dL — ABNORMAL HIGH (ref <117)

## 2013-06-14 MED ORDER — CITALOPRAM HYDROBROMIDE 20 MG PO TABS: 40.0000 mg | ORAL_TABLET | Freq: Every day | ORAL | Status: DC

## 2013-06-14 MED ORDER — TRAMADOL HCL 50 MG PO TABS: 50.0000 mg | ORAL_TABLET | Freq: Three times a day (TID) | ORAL | Status: DC | PRN

## 2013-06-14 NOTE — Progress Notes (Signed)
CC: Benjamin Powell is a 68 y.o. male is here for waking up suddenly at night   Subjective: HPI:  Patient reports that he has been sleep walking one to two-week period it occurs during vivid dreams he is usually woken by his wife during these episodes. He has not been injured by these episodes but has had situations where he is walking on top of the bed. Symptoms are mild to moderate in severity and interfering with family's quality of life. Nothing particular seems to make it better or worse. He reports he somewhat anxious about not having a job but he denies any other mental disturbance such as depression or paranoia. He denies any recreational drug use or alcohol use. He continues to smoke.  Follow up hypertension continues on amlodipine, clonidine, hydrochlorothiazide, lisinopril, metoprolol no outside blood pressures reports no chest pain short of breath or motor or sensory disturbances  Followup type 2 diabetes: He continues on January xr denies diarrhea polyuria polyphasia or polydipsia. He states that he drinks up to a gallon of sweet tea on a daily basis. Denies poorly healing wounds or vision loss  He expresses interest in smoking cessation he does not have a quit date he is down from 3 packs a day to 1-1/2 packs a day.  Low back pain: States that cyclobenzaprine is helpful however causing sedation. He's wondering if there's anything other than ibuprofen or Tylenol but he can take.   Review Of Systems Outlined In HPI  Past Medical History  Diagnosis Date  . Hypertension   . Diabetes mellitus   . Allergy     seasonal  . GERD (gastroesophageal reflux disease)   . Anxiety   . Sleep apnea     Past Surgical History  Procedure Laterality Date  . Ankle surgery  1993    left  . Tonsillectomy and adenoidectomy  1998    and sinus for sleep apnea  . Cholecystectomy  1990  . Ercp  1990   Family History  Problem Relation Age of Onset  . Stroke Brother   . Colitis Brother   .  Pancreatic cancer Mother   . Colitis Sister     History   Social History  . Marital Status: Married    Spouse Name: N/A    Number of Children: N/A  . Years of Education: N/A   Occupational History  . Not on file.   Social History Main Topics  . Smoking status: Current Every Day Smoker -- 0.30 packs/day for 50 years    Types: Cigarettes  . Smokeless tobacco: Never Used  . Alcohol Use: No  . Drug Use: No  . Sexual Activity: Not on file   Other Topics Concern  . Not on file   Social History Narrative  . No narrative on file     Objective: BP 138/90  Pulse 56  Wt 223 lb (101.152 kg)  Vital signs reviewed. General: Alert and Oriented, No Acute Distress HEENT: Pupils equal, round, reactive to light. Conjunctivae clear.  External ears unremarkable.  Moist mucous membranes. Lungs: Clear and comfortable work of breathing, speaking in full sentences without accessory muscle use. Cardiac: Regular rate and rhythm.  Neuro: CN II-XII grossly intact, gait normal. Extremities: No peripheral edema.  Strong peripheral pulses.  Back: No midline spinous process tenderness, full range of motion in the lumbar spine Mental Status: No depression, anxiety, nor agitation. Logical though process. Skin: Warm and dry.  Assessment & Plan: Benjamin Powell was seen today for waking  up suddenly at night.  Diagnoses and associated orders for this visit:  Type 2 diabetes mellitus - Hemoglobin A1c  Hypertension  Generalized anxiety disorder - citalopram (CELEXA) 20 MG tablet; Take 2 tablets (40 mg total) by mouth daily.  Tobacco use disorder  low back pain with paralumbar spasm. - traMADol (ULTRAM) 50 MG tablet; Take 1 tablet (50 mg total) by mouth every 8 (eight) hours as needed.    Type 2 diabetes: Sent for A1c today continue Janumet xr pending results Hypertension: Stable continue current regimen Tobacco use disorder: He was given samples of Nicoderm patches that he can start when he chooses  a meaningful quit date Generalized anxiety disorder: Uncontrolled Will increase the citalopram to see if this helps with mild subjective anxiety and sleep walking Low back pain: Continue on cyclobenzaprine with addition of tramadol as needed   Return in about 3 months (around 09/11/2013).

## 2013-08-05 ENCOUNTER — Ambulatory Visit (INDEPENDENT_AMBULATORY_CARE_PROVIDER_SITE_OTHER): Payer: Medicare Other | Admitting: Family Medicine

## 2013-08-05 ENCOUNTER — Encounter: Payer: Self-pay | Admitting: Family Medicine

## 2013-08-05 VITALS — BP 131/80 | HR 53 | Wt 223.0 lb

## 2013-08-05 DIAGNOSIS — S134XXA Sprain of ligaments of cervical spine, initial encounter: Secondary | ICD-10-CM

## 2013-08-05 DIAGNOSIS — F3289 Other specified depressive episodes: Secondary | ICD-10-CM

## 2013-08-05 DIAGNOSIS — S139XXA Sprain of joints and ligaments of unspecified parts of neck, initial encounter: Secondary | ICD-10-CM

## 2013-08-05 DIAGNOSIS — F32A Depression, unspecified: Secondary | ICD-10-CM

## 2013-08-05 DIAGNOSIS — G4733 Obstructive sleep apnea (adult) (pediatric): Secondary | ICD-10-CM | POA: Insufficient documentation

## 2013-08-05 DIAGNOSIS — F329 Major depressive disorder, single episode, unspecified: Secondary | ICD-10-CM
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
## 2013-08-22 ENCOUNTER — Telehealth: Payer: Self-pay | Admitting: Family Medicine

## 2013-08-22 DIAGNOSIS — IMO0001 Reserved for inherently not codable concepts without codable children: Secondary | ICD-10-CM

## 2013-08-22 MED ORDER — TRAMADOL HCL 50 MG PO TABS: 50.0000 mg | ORAL_TABLET | Freq: Three times a day (TID) | ORAL | Status: DC | PRN

## 2013-08-22 NOTE — Telephone Encounter (Signed)
Patient called left vm request to know if Dr. Ileene Rubens can write another prescription to have pain med filled. Pt did not say which pain med. Thanks

## 2013-08-22 NOTE — Telephone Encounter (Signed)
Called pt and spoke with him. He is having neck pain and would like tramadol refilled. Did refill tramadol.needs appt if pain persists

## 2013-08-29 ENCOUNTER — Telehealth: Payer: Self-pay | Admitting: *Deleted

## 2013-08-29 NOTE — Telephone Encounter (Signed)
Pt is calling and wanted to know if Dr. Ileene Rubens knew of a chiropractor he could reccomend

## 2013-08-30 NOTE — Telephone Encounter (Signed)
Left message on pt.'s vm.

## 2013-08-30 NOTE — Telephone Encounter (Signed)
Unfortunately I don't know of any

## 2013-10-07 LAB — HEMOGLOBIN A1C: HEMOGLOBIN A1C: 7 % — AB (ref 4.0–6.0)

## 2013-10-11 ENCOUNTER — Ambulatory Visit (INDEPENDENT_AMBULATORY_CARE_PROVIDER_SITE_OTHER): Payer: Medicare Other | Admitting: Family Medicine

## 2013-10-11 ENCOUNTER — Encounter: Payer: Self-pay | Admitting: Family Medicine

## 2013-10-11 VITALS — BP 126/73 | HR 54 | Wt 227.0 lb

## 2013-10-11 DIAGNOSIS — Z716 Tobacco abuse counseling: Secondary | ICD-10-CM

## 2013-10-11 DIAGNOSIS — F411 Generalized anxiety disorder: Secondary | ICD-10-CM

## 2013-10-11 DIAGNOSIS — I10 Essential (primary) hypertension: Secondary | ICD-10-CM

## 2013-10-11 DIAGNOSIS — F3289 Other specified depressive episodes: Secondary | ICD-10-CM

## 2013-10-11 DIAGNOSIS — F32A Depression, unspecified: Secondary | ICD-10-CM

## 2013-10-11 DIAGNOSIS — F329 Major depressive disorder, single episode, unspecified: Secondary | ICD-10-CM

## 2013-10-11 DIAGNOSIS — E119 Type 2 diabetes mellitus without complications: Secondary | ICD-10-CM

## 2013-10-11 DIAGNOSIS — Z23 Encounter for immunization: Secondary | ICD-10-CM

## 2013-10-11 LAB — BASIC METABOLIC PANEL WITH GFR
BUN: 13 mg/dL (ref 6–23)
CHLORIDE: 100 meq/L (ref 96–112)
CO2: 30 meq/L (ref 19–32)
Calcium: 9.4 mg/dL (ref 8.4–10.5)
Creat: 0.99 mg/dL (ref 0.50–1.35)
GFR, EST NON AFRICAN AMERICAN: 78 mL/min
GFR, Est African American: >89 mL/min
Glucose, Bld: 137 mg/dL — ABNORMAL HIGH (ref 70–99)
POTASSIUM: 3.5 meq/L (ref 3.5–5.3)
Sodium: 141 mEq/L (ref 135–145)

## 2013-10-11 LAB — HEMOGLOBIN A1C
HEMOGLOBIN A1C: 7.5 % — AB (ref <5.7)
Mean Plasma Glucose: 169 mg/dL — ABNORMAL HIGH (ref <117)

## 2013-10-11 MED ORDER — CLONIDINE HCL 0.3 MG PO TABS: 0.3000 mg | ORAL_TABLET | Freq: Two times a day (BID) | ORAL | Status: DC

## 2013-10-11 MED ORDER — HYDROCHLOROTHIAZIDE 25 MG PO TABS: 25.0000 mg | ORAL_TABLET | Freq: Every day | ORAL | Status: DC

## 2013-10-11 MED ORDER — SITAGLIP PHOS-METFORMIN HCL ER 50-1000 MG PO TB24: 1.0000 | ORAL_TABLET | Freq: Every day | ORAL | Status: DC

## 2013-10-11 MED ORDER — LISINOPRIL 40 MG PO TABS: 40.0000 mg | ORAL_TABLET | Freq: Every day | ORAL | Status: DC

## 2013-10-11 MED ORDER — AMLODIPINE BESYLATE 10 MG PO TABS: 10.0000 mg | ORAL_TABLET | Freq: Every day | ORAL | Status: DC

## 2013-10-11 MED ORDER — CITALOPRAM HYDROBROMIDE 20 MG PO TABS: 10.0000 mg | ORAL_TABLET | Freq: Every day | ORAL | Status: DC

## 2013-10-11 MED ORDER — BUPROPION HCL ER (SR) 150 MG PO TB12: ORAL_TABLET | ORAL | Status: DC

## 2013-10-11 MED ORDER — METOPROLOL TARTRATE 100 MG PO TABS: 100.0000 mg | ORAL_TABLET | Freq: Two times a day (BID) | ORAL | Status: DC

## 2013-10-11 MED ORDER — LISINOPRIL 40 MG PO TABS
40.0000 mg | ORAL_TABLET | Freq: Every day | ORAL | Status: DC
Start: 2013-10-11 — End: 2014-07-18

## 2013-10-11 MED ORDER — AMLODIPINE BESYLATE 10 MG PO TABS
10.0000 mg | ORAL_TABLET | Freq: Every day | ORAL | Status: DC
Start: 2013-10-11 — End: 2013-10-11

## 2013-10-11 NOTE — Progress Notes (Signed)
CC: Benjamin Powell is a 68 y.o. male is here for f/u medication   Subjective: HPI:  Followup hypertension: Continues on amlodipine, clonidine, lisinopril, metoprolol. No outside blood pressures report. Denies chest pain shortness of breath orthopnea nor peripheral edema. Denies lightheadedness or dizziness.  Followup type 2 diabetes: No outside blood sugars to report continues on JanumetXR.  Has cut back on sugar intake by only using one cup of sugar per gallon of sweet tea relative to 2 cups. No formal exercise routine. Denies vision loss, polyuria polyphasia or polydipsia.  Followup depression: He has cut back on his citalopram now taking 10 mg daily without any nocturnal side effects such as sleepwalking or involuntary movements. Fatigue has improved and depression is stable without any subjective depression or anxiety  He expresses interest in wanting to quit smoking. He has had success with Wellbutrin in the past without any intolerance.   Review Of Systems Outlined In HPI  Past Medical History  Diagnosis Date  . Hypertension   . Diabetes mellitus   . Allergy     seasonal  . GERD (gastroesophageal reflux disease)   . Anxiety   . Sleep apnea     Past Surgical History  Procedure Laterality Date  . Ankle surgery  1993    left  . Tonsillectomy and adenoidectomy  1998    and sinus for sleep apnea  . Cholecystectomy  1990  . Ercp  1990   Family History  Problem Relation Age of Onset  . Stroke Brother   . Colitis Brother   . Pancreatic cancer Mother   . Colitis Sister     History   Social History  . Marital Status: Married    Spouse Name: N/A    Number of Children: N/A  . Years of Education: N/A   Occupational History  . Not on file.   Social History Main Topics  . Smoking status: Current Every Day Smoker -- 0.30 packs/day for 50 years    Types: Cigarettes  . Smokeless tobacco: Never Used  . Alcohol Use: No  . Drug Use: No  . Sexual Activity: Not on file    Other Topics Concern  . Not on file   Social History Narrative  . No narrative on file     Objective: BP 126/73  Pulse 54  Wt 227 lb (102.967 kg)  General: Alert and Oriented, No Acute Distress HEENT: Pupils equal, round, reactive to light. Conjunctivae clear.  Moist membranes pharynx unremarkable Lungs: Clear to auscultation bilaterally, no wheezing/ronchi/rales.  Comfortable work of breathing. Good air movement. Cardiac: Regular rate and rhythm. Normal S1/S2.  No murmurs, rubs, nor gallops.   Extremities: No peripheral edema.  Strong peripheral pulses.  Mental Status: No depression, anxiety, nor agitation. Skin: Warm and dry.  Assessment & Plan: Benjamin Powell was seen today for f/u medication.  Diagnoses and associated orders for this visit:  Hypertension - BASIC METABOLIC PANEL WITH GFR - Discontinue: amLODipine (NORVASC) 10 MG tablet; Take 1 tablet (10 mg total) by mouth daily. - Discontinue: cloNIDine (CATAPRES) 0.3 MG tablet; Take 1 tablet (0.3 mg total) by mouth 2 (two) times daily. - Discontinue: hydrochlorothiazide (HYDRODIURIL) 25 MG tablet; Take 1 tablet (25 mg total) by mouth daily. - Discontinue: lisinopril (PRINIVIL,ZESTRIL) 40 MG tablet; Take 1 tablet (40 mg total) by mouth daily. - Discontinue: metoprolol (LOPRESSOR) 100 MG tablet; Take 1 tablet (100 mg total) by mouth 2 (two) times daily. - amLODipine (NORVASC) 10 MG tablet; Take 1 tablet (10 mg  total) by mouth daily. - cloNIDine (CATAPRES) 0.3 MG tablet; Take 1 tablet (0.3 mg total) by mouth 2 (two) times daily. - hydrochlorothiazide (HYDRODIURIL) 25 MG tablet; Take 1 tablet (25 mg total) by mouth daily. - lisinopril (PRINIVIL,ZESTRIL) 40 MG tablet; Take 1 tablet (40 mg total) by mouth daily. - metoprolol (LOPRESSOR) 100 MG tablet; Take 1 tablet (100 mg total) by mouth 2 (two) times daily.  Type 2 diabetes mellitus - Hemoglobin Y7X - BASIC METABOLIC PANEL WITH GFR  Depression - Discontinue:  SitaGLIPtin-MetFORMIN HCl (JANUMET XR) 50-1000 MG TB24; Take 1 tablet by mouth at bedtime. - SitaGLIPtin-MetFORMIN HCl (JANUMET XR) 50-1000 MG TB24; Take 1 tablet by mouth at bedtime.  Generalized anxiety disorder - citalopram (CELEXA) 20 MG tablet; Take 0.5 tablets (10 mg total) by mouth daily.  Encounter for smoking cessation counseling - buPROPion (WELLBUTRIN SR) 150 MG 12 hr tablet; Start one by mouth daily for three days then twice a day.  Begin one week before quit date.    Type 2 diabetes: A1c due today, checking renal function as well continue Janumet pending results. He is overdue for pneumonia vaccinations we'll start with Prevnar today and pneumococcal in 6 months Depression and anxiety: Controlled continue citalopram Hypertension: Controlled continue current antihypertensive regimen Smoking cessation: Discussed starting Wellbutrin one week prior to quit date and warnings discussed that he may have return of nocturnal involuntary movements and to notify me if this occurs.  Return in about 3 months (around 01/11/2014) for diabetes.

## 2013-10-14 ENCOUNTER — Telehealth: Payer: Self-pay | Admitting: Family Medicine

## 2013-10-14 DIAGNOSIS — F329 Major depressive disorder, single episode, unspecified: Secondary | ICD-10-CM

## 2013-10-14 DIAGNOSIS — F32A Depression, unspecified: Secondary | ICD-10-CM

## 2013-10-14 MED ORDER — SITAGLIP PHOS-METFORMIN HCL ER 50-1000 MG PO TB24: 2.0000 | ORAL_TABLET | Freq: Every day | ORAL | Status: DC

## 2013-10-14 NOTE — Telephone Encounter (Signed)
Benjamin Powell, Will you please let patient know that his A1c was 7.5 with a goal of less than 7.  I'd encourage him to increase his Janumet XR regimen to two tabs every evening.  Aside from blood sugar being elevated all other labwork was normal.  F/U in 3 months.

## 2013-10-14 NOTE — Telephone Encounter (Signed)
Left message on vm with results  

## 2013-10-17 ENCOUNTER — Telehealth: Payer: Self-pay | Admitting: *Deleted

## 2013-10-17 DIAGNOSIS — E119 Type 2 diabetes mellitus without complications: Secondary | ICD-10-CM

## 2013-10-17 NOTE — Telephone Encounter (Signed)
Pt states the janumet is too expensive and wants to know what to take as an alterantive. Please advise

## 2013-10-18 MED ORDER — SAXAGLIPTIN-METFORMIN ER 2.5-1000 MG PO TB24: 1.0000 | ORAL_TABLET | Freq: Two times a day (BID) | ORAL | Status: DC

## 2013-10-18 NOTE — Telephone Encounter (Signed)
Seth Bake, Will you please let the patient know that we can substitute janumet with kombiglyze which i've sent to his [facility].  Let me know if this is more affordable.

## 2013-10-18 NOTE — Telephone Encounter (Signed)
Message left on vm 

## 2013-10-30 ENCOUNTER — Encounter: Payer: Self-pay | Admitting: Family Medicine

## 2013-12-06 ENCOUNTER — Ambulatory Visit: Payer: Medicare Other | Admitting: Family Medicine

## 2013-12-06 ENCOUNTER — Telehealth: Payer: Self-pay | Admitting: Family Medicine

## 2013-12-06 DIAGNOSIS — R0683 Snoring: Secondary | ICD-10-CM

## 2013-12-06 DIAGNOSIS — G478 Other sleep disorders: Secondary | ICD-10-CM

## 2013-12-06 DIAGNOSIS — R5383 Other fatigue: Secondary | ICD-10-CM
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
## 2013-12-06 NOTE — Telephone Encounter (Signed)
Pt called.  He cancelled his appt to discuss getting a referral.  He wants [place] to let you know instead that he wants a referral for a sleep study.  Thank you.

## 2013-12-09 NOTE — Telephone Encounter (Signed)
Pt aware.

## 2013-12-09 NOTE — Telephone Encounter (Signed)
Order for sleep study has been placed, please let us know if not contacted by next week regarding scheduling.

## 2013-12-09 NOTE — Telephone Encounter (Signed)
Pt has answered yes to all of the below questions

## 2013-12-09 NOTE — Addendum Note (Signed)
Addended by: Marcial Pacas on: 12/09/2013 12:42 PM   Modules accepted: Orders

## 2014-01-14 ENCOUNTER — Telehealth: Payer: Self-pay | Admitting: *Deleted

## 2014-01-14 NOTE — Telephone Encounter (Signed)
The pharmacist from [facility] called and wanted a refill for tramadol. This script was wrote in 4/15. He has cancelled some appts but is scheduled to be in on Friday of this week. I denied the refill. Margette Fast

## 2014-01-15 ENCOUNTER — Other Ambulatory Visit: Payer: Self-pay | Admitting: Family Medicine

## 2014-01-15 ENCOUNTER — Ambulatory Visit: Payer: Medicare Other | Admitting: Family Medicine

## 2014-01-17 ENCOUNTER — Telehealth: Payer: Self-pay | Admitting: *Deleted

## 2014-01-17 ENCOUNTER — Ambulatory Visit (INDEPENDENT_AMBULATORY_CARE_PROVIDER_SITE_OTHER): Payer: Medicare Other | Admitting: Family Medicine

## 2014-01-17 ENCOUNTER — Encounter: Payer: Self-pay | Admitting: Family Medicine

## 2014-01-17 VITALS — BP 119/77 | HR 55 | Wt 226.0 lb

## 2014-01-17 DIAGNOSIS — E1129 Type 2 diabetes mellitus with other diabetic kidney complication: Secondary | ICD-10-CM

## 2014-01-17 DIAGNOSIS — IMO0001 Reserved for inherently not codable concepts without codable children: Secondary | ICD-10-CM

## 2014-01-17 DIAGNOSIS — Z125 Encounter for screening for malignant neoplasm of prostate: Secondary | ICD-10-CM

## 2014-01-17 DIAGNOSIS — N058 Unspecified nephritic syndrome with other morphologic changes: Secondary | ICD-10-CM

## 2014-01-17 DIAGNOSIS — B353 Tinea pedis: Secondary | ICD-10-CM

## 2014-01-17 DIAGNOSIS — E1121 Type 2 diabetes mellitus with diabetic nephropathy: Secondary | ICD-10-CM

## 2014-01-17 DIAGNOSIS — I10 Essential (primary) hypertension: Secondary | ICD-10-CM

## 2014-01-17 LAB — POCT UA - MICROALBUMIN: Microalbumin Ur, POC: 150 mg/L

## 2014-01-17 LAB — POCT GLYCOSYLATED HEMOGLOBIN (HGB A1C): Hemoglobin A1C: 7

## 2014-01-17 MED ORDER — METFORMIN HCL 500 MG PO TABS: ORAL_TABLET | ORAL | Status: DC

## 2014-01-17 MED ORDER — CLOTRIMAZOLE 1 % EX CREA: TOPICAL_CREAM | CUTANEOUS | Status: AC

## 2014-01-17 MED ORDER — TRAMADOL HCL 50 MG PO TABS: 50.0000 mg | ORAL_TABLET | Freq: Three times a day (TID) | ORAL | Status: DC | PRN

## 2014-01-17 NOTE — Telephone Encounter (Signed)
Pt would like PSA added onto labs that he had drawn today.Labs added

## 2014-01-17 NOTE — Progress Notes (Signed)
CC: Benjamin Powell is a 68 y.o. male is here for Diabetes   Subjective: HPI:  Followup of central hypertension: Continues to take lisinopril and clonidine and metoprolol on a daily basis without known side effects. No outside blood per his report. Denies chest pain shortness of breath orthopnea nor peripheral edema.  Followup diabetes: Continues on metformin 1 g twice a day without side effects or intolerances. Tries to watch what he eats with respect to carbohydrate intake but admits for room for improvement. Denies polyuria, polydipsia nor motor or center disturbances  Complains of continued low back pain started as a spasm and tightness in the low lumbar region. Symptoms are well managed on Toradol and were absent for matter of months after what sounds to have been trigger point injections with Dr. Darene Lamer, 2 years ago. He denies any new motor or sensory disturbances nor radiation of the pain.  Complains of itching and burning in both feet so localized between the fifth and fourth toes. Has been present for the past 2-3 months on a daily basis. Moderate to severe in severity present all hours of the day more noticed at periods of rest. No interventions as of yet   Review Of Systems Outlined In HPI  Past Medical History  Diagnosis Date  . Hypertension   . Diabetes mellitus   . Allergy     seasonal  . GERD (gastroesophageal reflux disease)   . Anxiety   . Sleep apnea     Past Surgical History  Procedure Laterality Date  . Ankle surgery  1993    left  . Tonsillectomy and adenoidectomy  1998    and sinus for sleep apnea  . Cholecystectomy  1990  . Ercp  1990   Family History  Problem Relation Age of Onset  . Stroke Brother   . Colitis Brother   . Pancreatic cancer Mother   . Colitis Sister     History   Social History  . Marital Status: Married    Spouse Name: N/A    Number of Children: N/A  . Years of Education: N/A   Occupational History  . Not on file.   Social  History Main Topics  . Smoking status: Current Every Day Smoker -- 0.30 packs/day for 50 years    Types: Cigarettes  . Smokeless tobacco: Never Used  . Alcohol Use: No  . Drug Use: No  . Sexual Activity: Not on file   Other Topics Concern  . Not on file   Social History Narrative  . No narrative on file     Objective: BP 119/77  Pulse 55  Wt 226 lb (102.513 kg)  General: Alert and Oriented, No Acute Distress HEENT: Pupils equal, round, reactive to light. Conjunctivae clear.  Moist mucous membranes pharynx unremarkable Lungs: Clear to auscultation bilaterally, no wheezing/ronchi/rales.  Comfortable work of breathing. Good air movement. Cardiac: Regular rate and rhythm. Normal S1/S2.  No murmurs, rubs, nor gallops.   Extremities: No peripheral edema.  Strong peripheral pulses.  Mental Status: No depression, anxiety, nor agitation. Skin: Warm and dry. Mild maceration and flaking in the fourth/fifth web space of both right and left feet  Assessment & Plan: Benjamin Powell was seen today for diabetes.  Diagnoses and associated orders for this visit:  Essential hypertension  Microalbuminuric diabetic nephropathy  Type 2 diabetes mellitus with diabetic nephropathy - POCT HgB A1C - POCT UA - Microalbumin - Lipid panel - metFORMIN (GLUCOPHAGE) 500 MG tablet; TAKE 2 TABLET(S) BY MOUTH TWICE  DAILY WITH A MEAL  low back pain with paralumbar spasm. - traMADol (ULTRAM) 50 MG tablet; Take 1 tablet (50 mg total) by mouth every 8 (eight) hours as needed.  Tinea pedis of both feet - clotrimazole (LOTRIMIN) 1 % cream; Apply to affected areas twice a day for up to four weeks, applying up to two weeks after resolution of symptoms.    Central hypertension: Controlled continue clonidine, hydrochlorothiazide, lisinopril, metoprolol Sig 2 diabetes: A1c 7.0, controlled continue metformin. Due for lipid panel. Microalbuminuric nephropathy appears stable continue lisinopril Low back pain: Refills of  tramadol, encouraged to followup with Dr. Darene Lamer. for further intervention given success in the past Tinea pedis: Start clotrimazole  Declines flu shot   Return in about 3 months (around 04/18/2014).

## 2014-01-18 LAB — PSA: PSA: 1.9 ng/mL (ref <=4.00)

## 2014-01-18 LAB — LIPID PANEL
CHOL/HDL RATIO: 4.1 ratio
Cholesterol: 138 mg/dL (ref 0–200)
HDL: 34 mg/dL — ABNORMAL LOW (ref >39)
LDL Cholesterol: 76 mg/dL (ref 0–99)
TRIGLYCERIDES: 141 mg/dL (ref <150)
VLDL: 28 mg/dL (ref 0–40)

## 2014-01-27 ENCOUNTER — Institutional Professional Consult (permissible substitution): Payer: Medicare Other | Admitting: Sports Medicine

## 2014-01-31 ENCOUNTER — Ambulatory Visit (INDEPENDENT_AMBULATORY_CARE_PROVIDER_SITE_OTHER): Payer: Medicare Other

## 2014-01-31 ENCOUNTER — Encounter: Payer: Self-pay | Admitting: Sports Medicine

## 2014-01-31 ENCOUNTER — Ambulatory Visit (INDEPENDENT_AMBULATORY_CARE_PROVIDER_SITE_OTHER): Payer: Medicare Other | Admitting: Sports Medicine

## 2014-01-31 VITALS — BP 121/73 | HR 61 | Ht 73.0 in | Wt 228.0 lb

## 2014-01-31 DIAGNOSIS — IMO0001 Reserved for inherently not codable concepts without codable children: Secondary | ICD-10-CM

## 2014-01-31 DIAGNOSIS — M1712 Unilateral primary osteoarthritis, left knee: Secondary | ICD-10-CM

## 2014-01-31 DIAGNOSIS — M25469 Effusion, unspecified knee: Secondary | ICD-10-CM

## 2014-01-31 DIAGNOSIS — M5136 Other intervertebral disc degeneration, lumbar region: Secondary | ICD-10-CM

## 2014-01-31 DIAGNOSIS — M5030 Other cervical disc degeneration, unspecified cervical region: Secondary | ICD-10-CM | POA: Insufficient documentation

## 2014-01-31 DIAGNOSIS — M170 Bilateral primary osteoarthritis of knee: Secondary | ICD-10-CM | POA: Insufficient documentation

## 2014-01-31 DIAGNOSIS — M1710 Unilateral primary osteoarthritis, unspecified knee: Secondary | ICD-10-CM

## 2014-01-31 DIAGNOSIS — M51369 Other intervertebral disc degeneration, lumbar region without mention of lumbar back pain or lower extremity pain: Secondary | ICD-10-CM

## 2014-01-31 IMAGING — CR DG KNEE COMPLETE 4+V*L*
4 series · 4 of 4 positions shown · non-contrast
Comparison: Right knee [DATE]

CLINICAL DATA: Left knee pain.  No injury.

EXAM:
LEFT KNEE - COMPLETE 4+ VIEW

[view not recorded (1 of 4)]
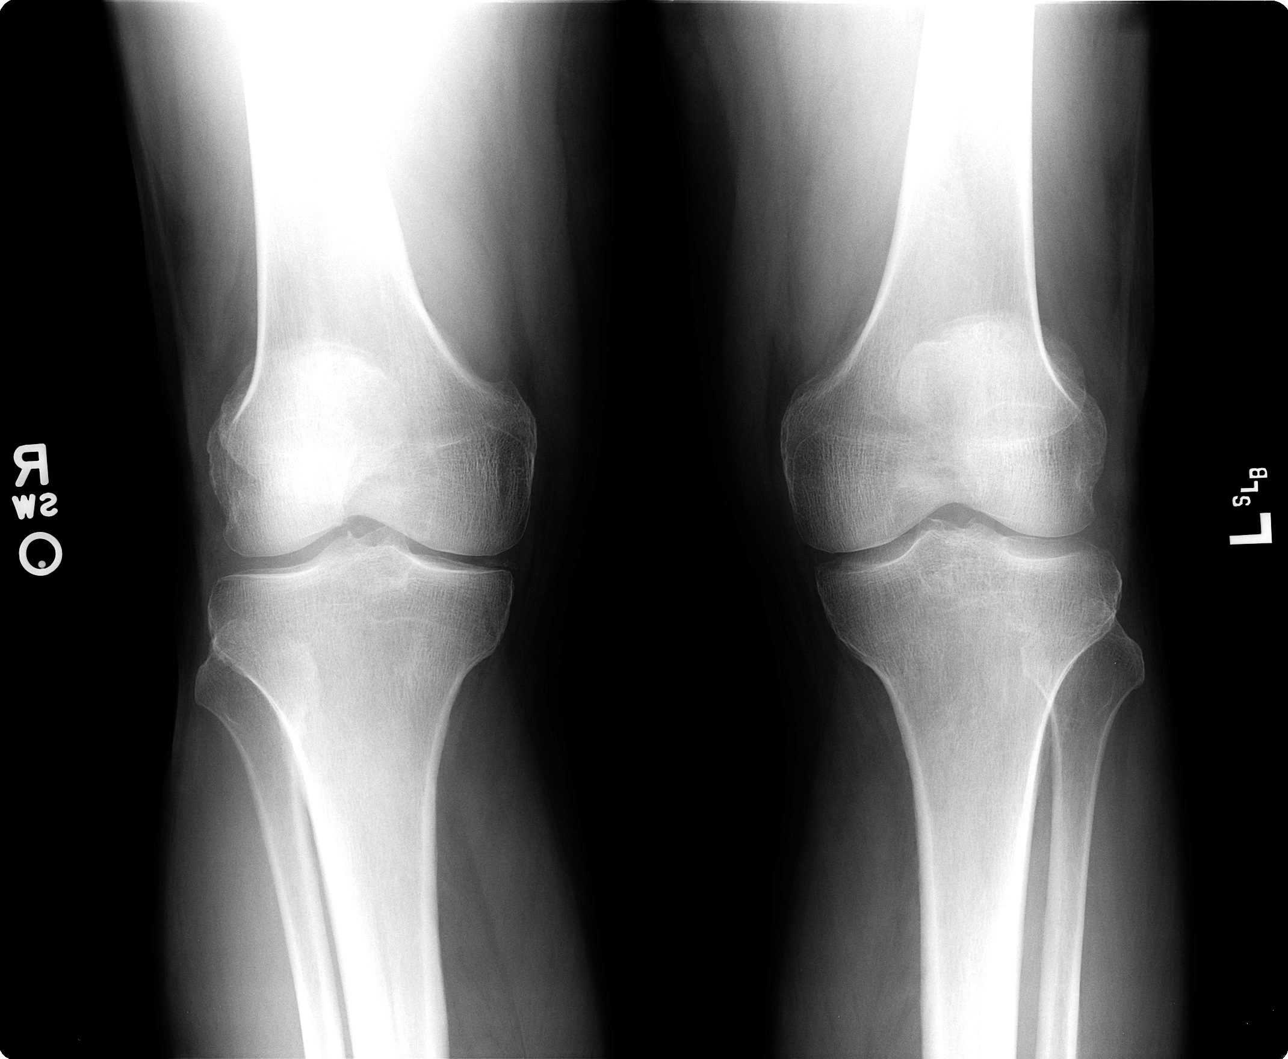

[view not recorded (2 of 4)]
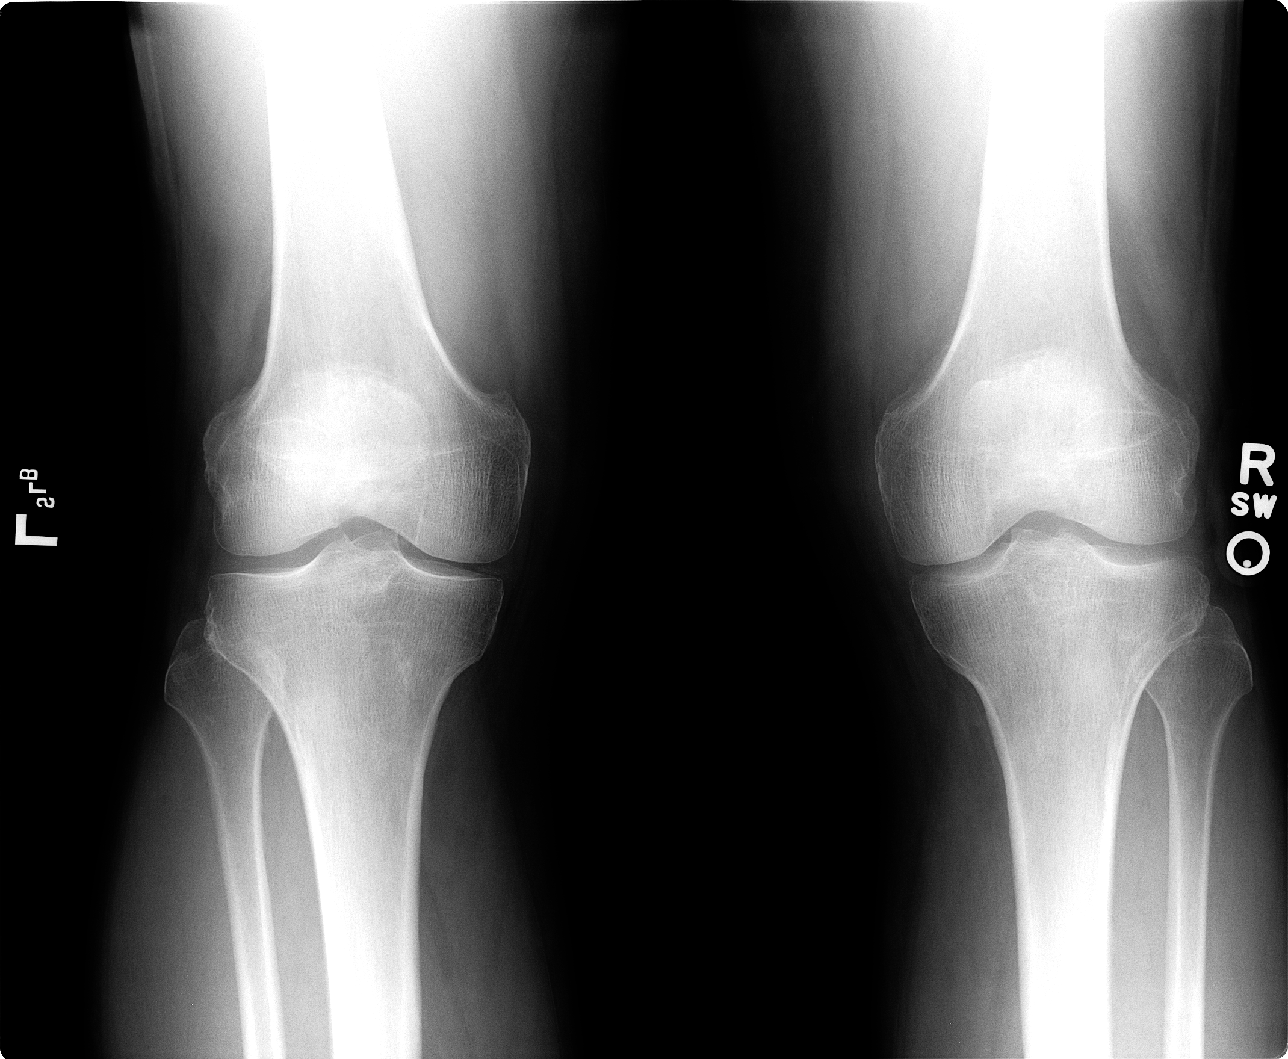

[view not recorded (3 of 4)]
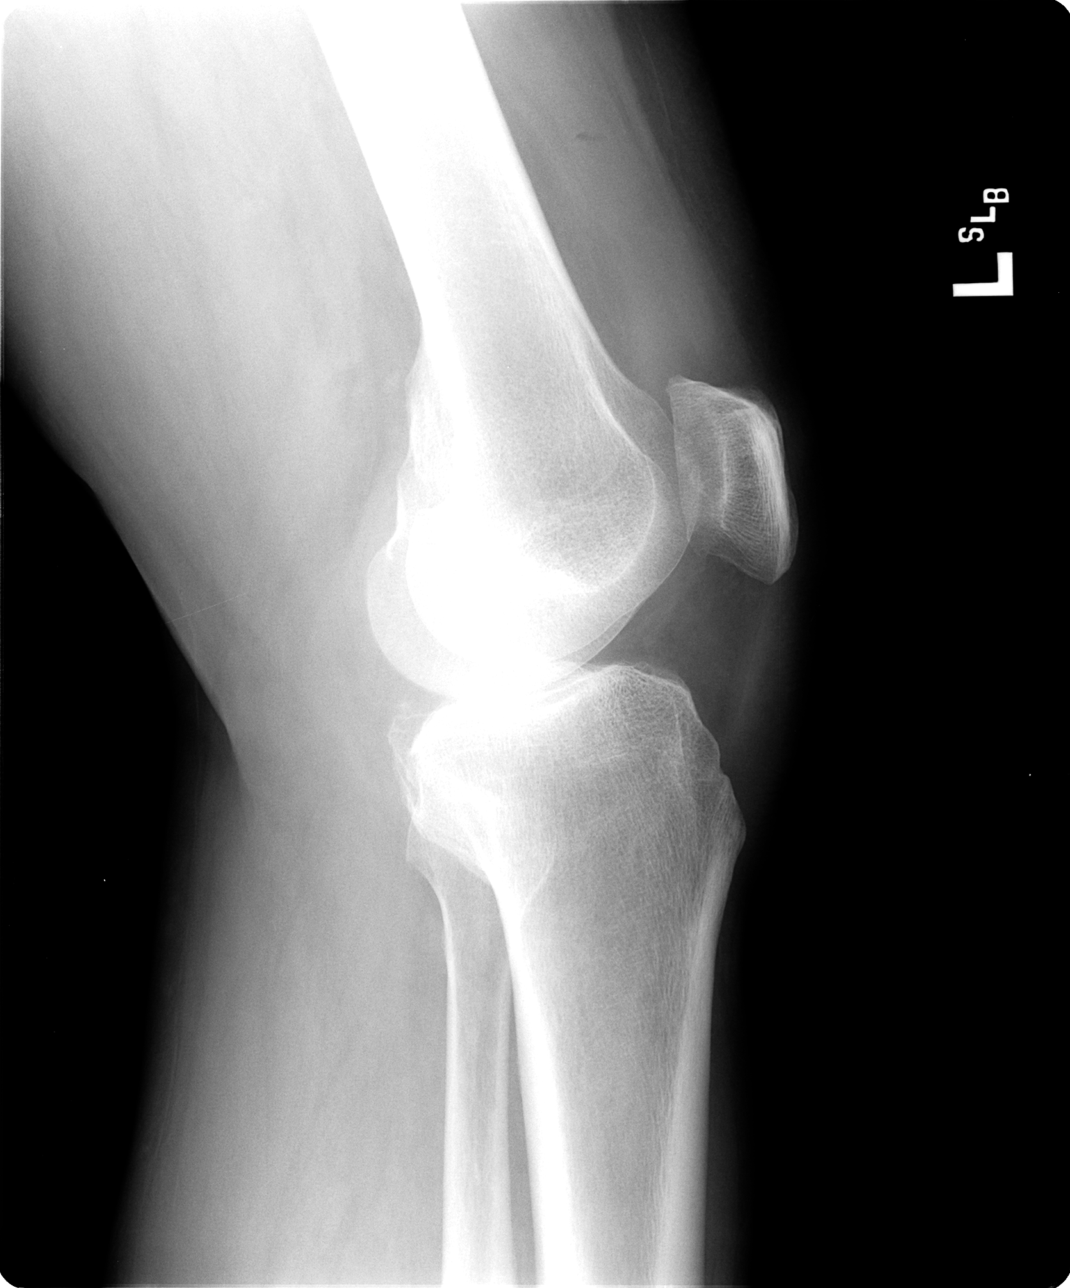

[view not recorded (4 of 4)]
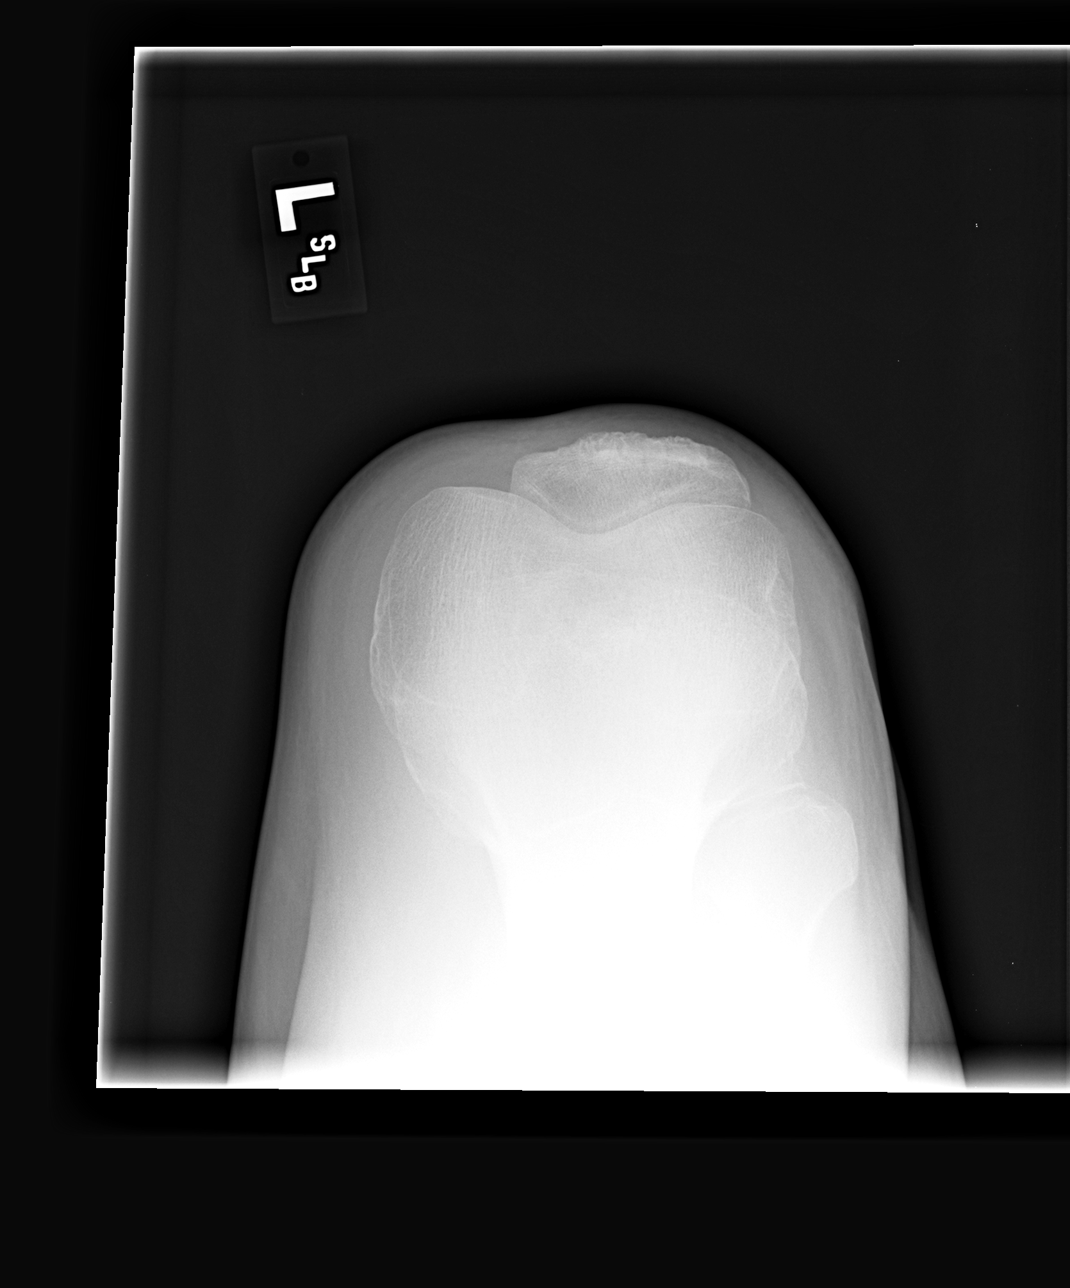

[4 of 4 positions shown; findings below may reference images not displayed]

FINDINGS: The left knee is located without a fracture. Mild degenerative
changes in the patellofemoral compartment and there may be a small
suprapatellar joint effusion. Mild narrowing at the medial knee
compartment. There is some chondrocalcinosis along the medial knee
joint. Images of the right knee demonstrate no acute abnormality.
IMPRESSION: Mild osteoarthritis in the left knee.  No acute bone abnormality.

## 2014-01-31 MED ORDER — PREDNISONE 50 MG PO TABS: ORAL_TABLET | ORAL | Status: DC

## 2014-01-31 MED ORDER — DICLOFENAC SODIUM 75 MG PO TBEC: 75.0000 mg | DELAYED_RELEASE_TABLET | Freq: Two times a day (BID) | ORAL | Status: DC

## 2014-01-31 NOTE — Progress Notes (Signed)
Subjective:    CC: Neck and back pain  HPI: Cervical degenerative disc disease: with upper neck and shoulder pain, good response to trigger point injection sometime ago. Desires repeat.  Lumbar degenerative disc disease: Good response to trigger point injection sometime ago, again, desires repeat. No radicular symptoms.  Left knee pain: Swollen, painful at the medial joint line, present for weeks, desires interventional treatment. Pain is moderate, persistent.  Past medical history, Surgical history, Family history not pertinant except as noted below, Social history, Allergies, and medications have been entered into the medical record, reviewed, and no changes needed.   Review of Systems: No fevers, chills, night sweats, weight loss, chest pain, or shortness of breath.   Objective:    General: Well Developed, well nourished, and in no acute distress.  Neuro: Alert and oriented x3, extra-ocular muscles intact, sensation grossly intact.  HEENT: Normocephalic, atraumatic, pupils equal round reactive to light, neck supple, no masses, no lymphadenopathy, thyroid nonpalpable.  Skin: Warm and dry, no rashes. Cardiac: Regular rate and rhythm, no murmurs rubs or gallops, no lower extremity edema.  Respiratory: Clear to auscultation bilaterally. Not using accessory muscles, speaking in full sentences. Neck: Negative spurling's Full neck range of motion Grip strength and sensation normal in bilateral hands Strength good C4 to T1 distribution No sensory change to C4 to T1 Reflexes normal Tender to palpation of the trapezius muscle bilaterally. Back Exam:  Inspection: Unremarkable  Motion: Flexion 45 deg, Extension 45 deg, Side Bending to 45 deg bilaterally,  Rotation to 45 deg bilaterally  SLR laying: Negative  XSLR laying: Negative  Palpable tenderness: Bilateral quadratus lumborum trigger points. FABER: negative. Sensory change: Gross sensation intact to all lumbar and sacral  dermatomes.  Reflexes: 2+ at both patellar tendons, 2+ at achilles tendons, Babinski's downgoing.  Strength at foot  Plantar-flexion: 5/5 Dorsi-flexion: 5/5 Eversion: 5/5 Inversion: 5/5  Leg strength  Quad: 5/5 Hamstring: 5/5 Hip flexor: 5/5 Hip abductors: 5/5  Gait unremarkable. Left Knee: Visible and palpable effusion. Tender at the joint lines. ROM normal in flexion and extension and lower leg rotation. Ligaments with solid consistent endpoints including ACL, PCL, LCL, MCL. Negative Mcmurray's and provocative meniscal tests. Non painful patellar compression. Patellar and quadriceps tendons unremarkable. Hamstring and quadriceps strength is normal.  Procedure:  Injection of 2 paracervical and 2 paralumbar trigger points. Consent obtained and verified. Time-out conducted. Noted no overlying erythema, induration, or other signs of local infection. Skin prepped in a sterile fashion. Topical analgesic spray: Ethyl chloride. Completed without difficulty. Meds: A total of 1 cc kenalog 40, 5 cc lidocaine spread out between the above 4 trigger points Pain immediately improved suggesting accurate placement of the medication. Advised to call if fevers/chills, erythema, induration, drainage, or persistent bleeding.  Procedure: Real-time Ultrasound Guided Injection of left knee Device: GE Logiq E  Verbal informed consent obtained.  Time-out conducted.  Noted no overlying erythema, induration, or other signs of local infection.  Skin prepped in a sterile fashion.  Local anesthesia: Topical Ethyl chloride.  With sterile technique and under real time ultrasound guidance:  Aspirated 30 cc of straw-colored fluid, syringe switched and 2 cc Kenalog 40, 4 cc lidocaine injected easily. Completed without difficulty  Pain immediately resolved suggesting accurate placement of the medication.  Advised to call if fevers/chills, erythema, induration, drainage, or persistent bleeding.  Images permanently  stored and available for review in the ultrasound unit.  Impression: Technically successful ultrasound guided injection.  Impression and Recommendations:

## 2014-01-31 NOTE — Assessment & Plan Note (Signed)
Aspiration and injection of left knee. X-rays. Return in one month.

## 2014-01-31 NOTE — Assessment & Plan Note (Signed)
Trigger point injections as above. Prednisone, Voltaren, formal PT. Return in one month.

## 2014-02-07 ENCOUNTER — Ambulatory Visit (HOSPITAL_BASED_OUTPATIENT_CLINIC_OR_DEPARTMENT_OTHER): Payer: Medicare Other | Attending: Family Medicine | Admitting: Radiology

## 2014-02-07 VITALS — Ht 72.0 in | Wt 228.0 lb

## 2014-02-07 DIAGNOSIS — G4733 Obstructive sleep apnea (adult) (pediatric): Secondary | ICD-10-CM

## 2014-02-07 DIAGNOSIS — G478 Other sleep disorders: Secondary | ICD-10-CM

## 2014-02-07 DIAGNOSIS — G4710 Hypersomnia, unspecified: Secondary | ICD-10-CM | POA: Insufficient documentation

## 2014-02-07 DIAGNOSIS — R0683 Snoring: Secondary | ICD-10-CM | POA: Diagnosis not present

## 2014-02-07 DIAGNOSIS — G4761 Periodic limb movement disorder: Secondary | ICD-10-CM | POA: Diagnosis not present

## 2014-02-07 DIAGNOSIS — R5383 Other fatigue: Secondary | ICD-10-CM

## 2014-02-07 DIAGNOSIS — G4730 Sleep apnea, unspecified: Secondary | ICD-10-CM | POA: Diagnosis present

## 2014-02-08 DIAGNOSIS — G4733 Obstructive sleep apnea (adult) (pediatric): Secondary | ICD-10-CM

## 2014-02-08 DIAGNOSIS — G478 Other sleep disorders: Secondary | ICD-10-CM

## 2014-02-08 DIAGNOSIS — R0683 Snoring: Secondary | ICD-10-CM

## 2014-02-08 DIAGNOSIS — R5383 Other fatigue: Secondary | ICD-10-CM

## 2014-02-08 NOTE — Sleep Study (Signed)
   NAME: Benjamin Powell DATE OF BIRTH:  02-Dec-1945 MEDICAL RECORD NUMBER [id number]  LOCATION: [facility]  PHYSICIAN: YOUNG,CLINTON D  DATE OF STUDY: 02/07/2014  SLEEP STUDY TYPE: Nocturnal Polysomnogram               REFERRING PHYSICIAN: Hommel, Sean, DO  INDICATION FOR STUDY: Hypersomnia with sleep apnea  EPWORTH SLEEPINESS SCORE:   23/24 HEIGHT: 6' (182.9 cm)  WEIGHT: 228 lb (103.42 kg)    Body mass index is 30.92 kg/(m^2).  NECK SIZE: 17.5 in.  MEDICATIONS: Charted for review  SLEEP ARCHITECTURE: Total sleep time 200 minutes with sleep efficiency 55.3%. Stage I was 17%, stage II 77%, stage III absent, REM 6% of total sleep time. Sleep latency 42.5 minutes, REM latency 109.5 minutes, awake after sleep onset 105.5 minutes, arousal index 42. Sleep onset around 11:20 PM. He was mostly awake after her 1:45 AM with the lights on at 3:30 AM.  RESPIRATORY DATA: Apnea hypopneas index (AHI) 21 per hour. 70 total events scored including 28 obstructive apneas, 10 central apneas, 2 mixed apneas, 30 hypopneas area events were noted in all sleep positions. REM AHI 0. There were not enough early events to permit application of split protocol CPAP titration on this study night.  OXYGEN DATA: Moderate snoring with oxygen desaturation to a nadir of 86% and mean saturation 91.3% on room air  CARDIAC DATA: Sinus rhythm with PVCs  MOVEMENT/PARASOMNIA: 64 total limb jerks were counted of which 11 were associated with arousals or awakenings for a periodic limb movement with arousal index of 3.3 per hour. Bathroom x2  IMPRESSION/ RECOMMENDATION:   1) Short total sleep time. Sleep onset around 11:20 PM. He was spontaneously awake after around 1:30 AM with lights on at 3:30 AM. No bedtime medication was taken. He was up for bathroom twice. If this reflects his sleep pattern in the home environment, then management for a component of insomnia may be helpful. 2) Moderate obstructive  sleep apnea/hypopneas syndrome, AHI 21 per hour with events in all positions. Moderate snoring with oxygen desaturation to a nadir of 86% and mean saturation 91.3% on room air. 3) There were not enough early events to permit application of split protocol CPAP titration. This patient could return for dedicated CPAP titration study if appropriate. 4) Mild periodic limb movement sleep disturbance. A total of 64 limb jerks were counted of which 11 were associated with arousals or awakenings for a periodic limb movement with arousal index of 3.3 per hour. This represents a small additional source of sleep fragmentation which may contribute to daytime sleepiness.  [name] Diplomate, American Board of Sleep Medicine  ELECTRONICALLY SIGNED ON:  02/08/2014, 11:59 AM [place] PH: [phone]   FX: [phone] [place]

## 2014-02-10 ENCOUNTER — Telehealth: Payer: Self-pay | Admitting: Family Medicine

## 2014-02-10 DIAGNOSIS — G4733 Obstructive sleep apnea (adult) (pediatric): Secondary | ICD-10-CM

## 2014-02-10 NOTE — Telephone Encounter (Signed)
LM on voicemail to return call. Margette Fast, CMA

## 2014-02-10 NOTE — Telephone Encounter (Signed)
Seth Bake, Will you please let patient know that his sleep study confirmed sleep apnea.  I would recommend he have a CPAP titration procedure which I just now placed an order for.  Please let me know if not contacted about this within the next week.

## 2014-02-11 NOTE — Telephone Encounter (Signed)
Message with results left on vm

## 2014-02-21 ENCOUNTER — Ambulatory Visit (INDEPENDENT_AMBULATORY_CARE_PROVIDER_SITE_OTHER): Payer: Medicare Other | Admitting: Physical Therapy

## 2014-02-21 DIAGNOSIS — M2560 Stiffness of unspecified joint, not elsewhere classified: Secondary | ICD-10-CM

## 2014-02-21 DIAGNOSIS — R293 Abnormal posture: Secondary | ICD-10-CM

## 2014-02-21 DIAGNOSIS — M542 Cervicalgia: Secondary | ICD-10-CM

## 2014-02-21 DIAGNOSIS — M5030 Other cervical disc degeneration, unspecified cervical region: Secondary | ICD-10-CM

## 2014-02-28 ENCOUNTER — Encounter: Payer: Self-pay | Admitting: Sports Medicine

## 2014-02-28 ENCOUNTER — Ambulatory Visit (INDEPENDENT_AMBULATORY_CARE_PROVIDER_SITE_OTHER): Payer: Medicare Other | Admitting: Sports Medicine

## 2014-02-28 ENCOUNTER — Encounter (INDEPENDENT_AMBULATORY_CARE_PROVIDER_SITE_OTHER): Payer: Medicare Other | Admitting: Physical Therapy

## 2014-02-28 VITALS — BP 121/81 | HR 60 | Ht 72.0 in | Wt 226.0 lb

## 2014-02-28 DIAGNOSIS — M5030 Other cervical disc degeneration, unspecified cervical region: Secondary | ICD-10-CM

## 2014-02-28 DIAGNOSIS — R293 Abnormal posture: Secondary | ICD-10-CM

## 2014-02-28 DIAGNOSIS — M1712 Unilateral primary osteoarthritis, left knee: Secondary | ICD-10-CM

## 2014-02-28 DIAGNOSIS — M542 Cervicalgia: Secondary | ICD-10-CM

## 2014-02-28 DIAGNOSIS — M2560 Stiffness of unspecified joint, not elsewhere classified: Secondary | ICD-10-CM

## 2014-02-28 DIAGNOSIS — M5136 Other intervertebral disc degeneration, lumbar region: Secondary | ICD-10-CM

## 2014-02-28 DIAGNOSIS — M51369 Other intervertebral disc degeneration, lumbar region without mention of lumbar back pain or lower extremity pain: Secondary | ICD-10-CM

## 2014-02-28 NOTE — Assessment & Plan Note (Signed)
Resolved with conservative measures, and trigger point injections at the last visit.

## 2014-02-28 NOTE — Assessment & Plan Note (Signed)
Resolved after aspiration and injection of the last visit. Return as needed.

## 2014-02-28 NOTE — Progress Notes (Signed)
  Subjective:    CC: Followup  HPI: Cervical degenerative disc disease: Resolved with conservative measures, and trigger point injections at the last visit.  Lumbar degenerative disc disease: Resolved with conservative measures, and trigger point injections at the last visit.  Left knee osteoarthritis: Resolved after aspiration and injection of the last visit.  Past medical history, Surgical history, Family history not pertinant except as noted below, Social history, Allergies, and medications have been entered into the medical record, reviewed, and no changes needed.   Review of Systems: No fevers, chills, night sweats, weight loss, chest pain, or shortness of breath.   Objective:    General: Well Developed, well nourished, and in no acute distress.  Neuro: Alert and oriented x3, extra-ocular muscles intact, sensation grossly intact.  HEENT: Normocephalic, atraumatic, pupils equal round reactive to light, neck supple, no masses, no lymphadenopathy, thyroid nonpalpable.  Skin: Warm and dry, no rashes. Cardiac: Regular rate and rhythm, no murmurs rubs or gallops, no lower extremity edema.  Respiratory: Clear to auscultation bilaterally. Not using accessory muscles, speaking in full sentences. Left Knee: Normal to inspection with no erythema or effusion or obvious bony abnormalities. Palpation normal with no warmth or joint line tenderness or patellar tenderness or condyle tenderness. ROM normal in flexion and extension and lower leg rotation. Ligaments with solid consistent endpoints including ACL, PCL, LCL, MCL. Negative Mcmurray's and provocative meniscal tests. Non painful patellar compression. Patellar and quadriceps tendons unremarkable. Hamstring and quadriceps strength is normal.  Impression and Recommendations:

## 2014-03-07 ENCOUNTER — Encounter (INDEPENDENT_AMBULATORY_CARE_PROVIDER_SITE_OTHER): Payer: Medicare Other | Admitting: Physical Therapy

## 2014-03-07 DIAGNOSIS — M5030 Other cervical disc degeneration, unspecified cervical region: Secondary | ICD-10-CM

## 2014-03-07 DIAGNOSIS — R293 Abnormal posture: Secondary | ICD-10-CM

## 2014-03-07 DIAGNOSIS — M542 Cervicalgia: Secondary | ICD-10-CM

## 2014-03-07 DIAGNOSIS — M2560 Stiffness of unspecified joint, not elsewhere classified: Secondary | ICD-10-CM

## 2014-03-10 ENCOUNTER — Encounter: Payer: Medicare Other | Admitting: Physical Therapy

## 2014-03-14 ENCOUNTER — Encounter: Payer: Medicare Other | Admitting: Physical Therapy

## 2014-03-17 ENCOUNTER — Encounter: Payer: Medicare Other | Admitting: Physical Therapy

## 2014-03-21 ENCOUNTER — Encounter: Payer: Medicare Other | Admitting: Physical Therapy

## 2014-03-21 ENCOUNTER — Other Ambulatory Visit: Payer: Self-pay | Admitting: *Deleted

## 2014-03-21 DIAGNOSIS — IMO0001 Reserved for inherently not codable concepts without codable children: Secondary | ICD-10-CM

## 2014-03-21 MED ORDER — TRAMADOL HCL 50 MG PO TABS: 50.0000 mg | ORAL_TABLET | Freq: Three times a day (TID) | ORAL | Status: DC | PRN

## 2014-03-24 ENCOUNTER — Encounter: Payer: Medicare Other | Admitting: Physical Therapy

## 2014-03-28 ENCOUNTER — Encounter: Payer: Medicare Other | Admitting: Physical Therapy

## 2014-03-28 ENCOUNTER — Telehealth: Payer: Self-pay

## 2014-03-28 ENCOUNTER — Telehealth: Payer: Self-pay | Admitting: Family Medicine

## 2014-03-28 MED ORDER — HYDROCODONE-ACETAMINOPHEN 10-325 MG PO TABS: 1.0000 | ORAL_TABLET | Freq: Every evening | ORAL | Status: DC | PRN

## 2014-03-28 NOTE — Telephone Encounter (Signed)
Patient called stated that Tramadol is not working for his pain he is requesting a stronger medication. Rhonda Cunningham,CMA

## 2014-03-28 NOTE — Telephone Encounter (Signed)
Patient states the tramadol is not working.  He wants to know if he can get something stronger to take at bedtime to help with the back and neck pain.  He uses [facility].

## 2014-03-28 NOTE — Telephone Encounter (Signed)
The last time I saw him all of his pain had resolved, it is recurrent we should consider advanced imaging of the neck and back for further evaluation, in the meantime double tramadol to 2 pills 3 times per day.

## 2014-03-28 NOTE — Telephone Encounter (Signed)
Called patient, issue is mostly difficulty falling asleep.  Hydrocodone provided to help with this and urged to continue pursuing non-narcotic management with Dr. Darene Lamer.

## 2014-04-01 NOTE — Telephone Encounter (Signed)
Left detailed message on patient mvm with instructions as noted below. Rhonda Cunningham,CMA  

## 2014-04-18 ENCOUNTER — Ambulatory Visit (INDEPENDENT_AMBULATORY_CARE_PROVIDER_SITE_OTHER): Payer: Medicare Other | Admitting: Family Medicine

## 2014-04-18 ENCOUNTER — Encounter: Payer: Self-pay | Admitting: Family Medicine

## 2014-04-18 VITALS — BP 137/84 | HR 56 | Wt 227.0 lb

## 2014-04-18 DIAGNOSIS — N529 Male erectile dysfunction, unspecified: Secondary | ICD-10-CM

## 2014-04-18 DIAGNOSIS — E1121 Type 2 diabetes mellitus with diabetic nephropathy: Secondary | ICD-10-CM

## 2014-04-18 DIAGNOSIS — M5030 Other cervical disc degeneration, unspecified cervical region: Secondary | ICD-10-CM

## 2014-04-18 DIAGNOSIS — G4733 Obstructive sleep apnea (adult) (pediatric): Secondary | ICD-10-CM

## 2014-04-18 DIAGNOSIS — I10 Essential (primary) hypertension: Secondary | ICD-10-CM

## 2014-04-18 LAB — POCT GLYCOSYLATED HEMOGLOBIN (HGB A1C): HEMOGLOBIN A1C: 7.3

## 2014-04-18 MED ORDER — HYDROCODONE-ACETAMINOPHEN 10-325 MG PO TABS: 1.0000 | ORAL_TABLET | Freq: Every evening | ORAL | Status: DC | PRN

## 2014-04-18 MED ORDER — GLIPIZIDE ER 5 MG PO TB24: 5.0000 mg | ORAL_TABLET | Freq: Every day | ORAL | Status: DC

## 2014-04-18 NOTE — Progress Notes (Signed)
CC: Benjamin Powell is a 68 y.o. male is here for Diabetes   Subjective: HPI:  Follow-up essential hypertension: Has been checking blood pressure periodically at home and believes that blood pressures consistently below 140/90. Continues on hydrochlorothiazide, clonidine, amlodipine and lisinopril.  Follow-up OSA: He has a sleep study CPAP titration scheduled for January and plans on attending  Follow-up type 2 diabetes: Reports room for improvement with cutting back on desserts continues on metformin 1 g twice a day with no outside blood sugars report. No polyuria or polyphagia or polydipsia.  Complains of continued erectile dysfunction and wonders if his testosterone is low. Review of systems is positive for fatigue and decreased libido over the past 1-2 years. He's had success with Viagra however he does not have the finances to pay for this reoccurring basis.  When I spoke to him last he was having radicular neck pain that was keeping him awake at night. I gave him hydrocodone and over the past month he's only had to take it 6-8 times. He takes only when his neck pain is severe enough to keep him awake at night. Pain is alleviated within a few minutes after taking a single dose. He denies any side effects or intolerance to medication  Review Of Systems Outlined In HPI  Past Medical History  Diagnosis Date  . Hypertension   . Diabetes mellitus   . Allergy     seasonal  . GERD (gastroesophageal reflux disease)   . Anxiety   . Sleep apnea     Past Surgical History  Procedure Laterality Date  . Ankle surgery  1993    left  . Tonsillectomy and adenoidectomy  1998    and sinus for sleep apnea  . Cholecystectomy  1990  . Ercp  1990   Family History  Problem Relation Age of Onset  . Stroke Brother   . Colitis Brother   . Pancreatic cancer Mother   . Colitis Sister     History   Social History  . Marital Status: Married    Spouse Name: N/A    Number of Children: N/A  .  Years of Education: N/A   Occupational History  . Not on file.   Social History Main Topics  . Smoking status: Current Every Day Smoker -- 0.30 packs/day for 50 years    Types: Cigarettes  . Smokeless tobacco: Never Used  . Alcohol Use: No  . Drug Use: No  . Sexual Activity: Not on file   Other Topics Concern  . Not on file   Social History Narrative  . No narrative on file     Objective: BP 137/84 mmHg  Pulse 56  Wt 227 lb (102.967 kg)  General: Alert and Oriented, No Acute Distress HEENT: Pupils equal, round, reactive to light. Conjunctivae clear.  Moist mucous membranes pharynx unremarkable Lungs: Clear to auscultation bilaterally, no wheezing/ronchi/rales.  Comfortable work of breathing. Good air movement. Cardiac: Regular rate and rhythm. Normal S1/S2.  No murmurs, rubs, nor gallops.  No carotid bruits Extremities: No peripheral edema.  Strong peripheral pulses.  Mental Status: No depression, anxiety, nor agitation. Skin: Warm and dry.  Assessment & Plan: Benjamin Powell was seen today for diabetes.  Diagnoses and associated orders for this visit:  Essential hypertension  Obstructive sleep apnea  Type 2 diabetes mellitus with diabetic nephropathy - POCT HgB A1C - glipiZIDE (GLIPIZIDE XL) 5 MG 24 hr tablet; Take 1 tablet (5 mg total) by mouth daily with breakfast.  Erectile dysfunction,  unspecified erectile dysfunction type - Testosterone  Degenerative disc disease, cervical  Other Orders - HYDROcodone-acetaminophen (NORCO) 10-325 MG per tablet; Take 1 tablet by mouth at bedtime as needed.    Essential hypertension: Controlled continue amlodipine, clonidine, hydrochlorothiazide and lisinopril with metoprolol Objective sleep apnea: I encouraged him to keep his appointment for CPAP titration next month Type 2 diabetes: Uncontrolled today continue metformin and adding low-dose glipizide Erectile dysfunction: Checking testosterone today Degenerative disc disease:  Controlled continue sparing use of hydrocodone  Return in about 3 months (around 07/18/2014).

## 2014-04-19 LAB — TESTOSTERONE: Testosterone: 241 ng/dL — ABNORMAL LOW (ref 300–890)

## 2014-04-21 ENCOUNTER — Telehealth: Payer: Self-pay | Admitting: Family Medicine

## 2014-04-21 DIAGNOSIS — E291 Testicular hypofunction: Secondary | ICD-10-CM | POA: Insufficient documentation

## 2014-04-21 NOTE — Telephone Encounter (Signed)
Benjamin Powell, Will you please let patient know that his testosterone level was in the deficient range.  If he's still interested in testosterone replacement therapy I'd recommend he have a confirmatory testosterone test in one week along with a PSA and hemoglobin.  This can be a lab only visit, slips in your inbox.

## 2014-04-21 NOTE — Telephone Encounter (Signed)
Message left on vm with results  

## 2014-04-29 ENCOUNTER — Telehealth: Payer: Self-pay | Admitting: *Deleted

## 2014-04-29 NOTE — Telephone Encounter (Signed)
Benjamin Powell called that he is going to have blood work done for testosterone levels.

## 2014-05-16 ENCOUNTER — Ambulatory Visit (HOSPITAL_BASED_OUTPATIENT_CLINIC_OR_DEPARTMENT_OTHER): Payer: Medicare Other | Attending: Family Medicine

## 2014-06-12 ENCOUNTER — Telehealth: Payer: Self-pay | Admitting: Family Medicine

## 2014-06-12 NOTE — Telephone Encounter (Signed)
Benjamin Powell, Can you please ask the patient if he is truly requesting new diabetic testing supplies from a company called [employer]? I have a request from them and sometimes these requests are bogus.

## 2014-06-12 NOTE — Telephone Encounter (Signed)
I called him yesterday and asked that he call to let us know if this is legit

## 2014-06-18 NOTE — Telephone Encounter (Signed)
I'm going to keep throwing away these requests unless he tells [place] otherwise.

## 2014-06-20 ENCOUNTER — Telehealth: Payer: Self-pay | Admitting: Family Medicine

## 2014-06-20 ENCOUNTER — Encounter: Payer: Self-pay | Admitting: Sports Medicine

## 2014-06-20 ENCOUNTER — Ambulatory Visit (INDEPENDENT_AMBULATORY_CARE_PROVIDER_SITE_OTHER): Payer: Medicare Other | Admitting: Sports Medicine

## 2014-06-20 VITALS — BP 120/81 | HR 52 | Ht 72.0 in | Wt 223.0 lb

## 2014-06-20 DIAGNOSIS — IMO0001 Reserved for inherently not codable concepts without codable children: Secondary | ICD-10-CM

## 2014-06-20 DIAGNOSIS — M47812 Spondylosis without myelopathy or radiculopathy, cervical region: Secondary | ICD-10-CM | POA: Diagnosis not present

## 2014-06-20 MED ORDER — TRAMADOL HCL 50 MG PO TABS: 50.0000 mg | ORAL_TABLET | Freq: Three times a day (TID) | ORAL | Status: DC | PRN

## 2014-06-20 NOTE — Telephone Encounter (Signed)
Patient requesting a refill for his tramadol sent to [facility]

## 2014-06-20 NOTE — Telephone Encounter (Signed)
Benjamin Powell, Rx placed in in-box ready for pickup/faxing.  

## 2014-06-20 NOTE — Telephone Encounter (Signed)
faxed

## 2014-06-20 NOTE — Progress Notes (Signed)
  Subjective:    CC: neck pain  HPI: This is a pleasant 69 year old male with significant cervical spondylosis with left-sided upper neck pain and no radiculopathy. He responded extremely well 5 months ago to a set of trigger point injections and desires the same today. Pain is on the left side of the neck, worse with extension and left rotation.  Past medical history, Surgical history, Family history not pertinant except as noted below, Social history, Allergies, and medications have been entered into the medical record, reviewed, and no changes needed.   Review of Systems: No fevers, chills, night sweats, weight loss, chest pain, or shortness of breath.   Objective:    General: Well Developed, well nourished, and in no acute distress.  Neuro: Alert and oriented x3, extra-ocular muscles intact, sensation grossly intact.  HEENT: Normocephalic, atraumatic, pupils equal round reactive to light, neck supple, no masses, no lymphadenopathy, thyroid nonpalpable.  Skin: Warm and dry, no rashes. Cardiac: Regular rate and rhythm, no murmurs rubs or gallops, no lower extremity edema.  Respiratory: Clear to auscultation bilaterally. Not using accessory muscles, speaking in full sentences. Neck: Negative spurling's Full neck range of motion Grip strength and sensation normal in bilateral hands Strength good C4 to T1 distribution No sensory change to C4 to T1 Reflexes normal Tender to palpation along the left trapezius  Procedure:  Injection of left trapezial trigger points Consent obtained and verified. Time-out conducted. Noted no overlying erythema, induration, or other signs of local infection. Skin prepped in a sterile fashion. Topical analgesic spray: Ethyl chloride. Completed without difficulty. Meds: A total of 1 mL kenalog 40, 4 mL lidocaine spread out between 3 left trapezial trigger points. Pain immediately improved suggesting accurate placement of the medication. Advised to call  if fevers/chills, erythema, induration, drainage, or persistent bleeding.  CT scan personally reviewed and shows significant and severe cervical degenerative disc disease with fairly large anterior bridging osteophytes as well as severe bilateral mid cervical facet arthritis.  Impression and Recommendations:

## 2014-06-20 NOTE — Assessment & Plan Note (Signed)
Good response previously to several cervical trigger point injections, 5 month response. I did 3 left-sided trapezial trigger point injections today however considering the degree of cervical spondylosis, anterior bridging osteophytes and cervical facet arthritis I do think he will end up with cervical facet injections. Next line return in one month.

## 2014-07-14 ENCOUNTER — Ambulatory Visit (INDEPENDENT_AMBULATORY_CARE_PROVIDER_SITE_OTHER): Payer: Medicare Other | Admitting: Sports Medicine

## 2014-07-14 ENCOUNTER — Encounter: Payer: Self-pay | Admitting: Sports Medicine

## 2014-07-14 DIAGNOSIS — M47812 Spondylosis without myelopathy or radiculopathy, cervical region: Secondary | ICD-10-CM | POA: Diagnosis not present

## 2014-07-14 NOTE — Patient Instructions (Signed)
Please get the CT of your cervical spine burned to a disc for Dr. Earley Favor to review.

## 2014-07-14 NOTE — Assessment & Plan Note (Signed)
Has overall done well with cervical trigger point injections, he does have fairly severe bilateral cervical facet arthritis. Considering his pain is worse with rotation and extension, localized, and he does have a CT scan of the cervical spine very clearly shows his significant facet arthritis and do think we need to proceed with multilevel left-sided cervical facet injections, and if a good response we can proceed with medial branch blocks and RFA. I would like him to see Dr. Earley Favor up the road for further evaluation of his cervical facet arthritis. Return to see me on month after his cervical facet injections to evaluate response.

## 2014-07-14 NOTE — Progress Notes (Signed)
  Subjective:    CC: Neck pain  HPI: I have seen this pleasant 69 year old gentleman several times now, he has severe cervical spondylosis, we have done trigger point injections in the past, unfortunately the most recent trigger point injection a month ago was ineffective. He does have a CT scan that shows severe cervical degenerative disc disease as well as severe cervical facet arthritis. Pain is worse with extension and rotation, on the left side, persistent. He has never had cervical facet injections. Denies any radicular pain.  Past medical history, Surgical history, Family history not pertinant except as noted below, Social history, Allergies, and medications have been entered into the medical record, reviewed, and no changes needed.   Review of Systems: No fevers, chills, night sweats, weight loss, chest pain, or shortness of breath.   Objective:    General: Well Developed, well nourished, and in no acute distress.  Neuro: Alert and oriented x3, extra-ocular muscles intact, sensation grossly intact.  HEENT: Normocephalic, atraumatic, pupils equal round reactive to light, neck supple, no masses, no lymphadenopathy, thyroid nonpalpable.  Skin: Warm and dry, no rashes. Cardiac: Regular rate and rhythm, no murmurs rubs or gallops, no lower extremity edema.  Respiratory: Clear to auscultation bilaterally. Not using accessory muscles, speaking in full sentences. Neck: Negative spurling's Full neck range of motion Grip strength and sensation normal in bilateral hands Strength good C4 to T1 distribution No sensory change to C4 to T1 Reflexes normal  Impression and Recommendations:

## 2014-07-18 ENCOUNTER — Encounter: Payer: Self-pay | Admitting: Family Medicine

## 2014-07-18 ENCOUNTER — Ambulatory Visit (INDEPENDENT_AMBULATORY_CARE_PROVIDER_SITE_OTHER): Payer: Medicare Other | Admitting: Family Medicine

## 2014-07-18 VITALS — BP 113/71 | HR 61 | Wt 221.0 lb

## 2014-07-18 DIAGNOSIS — I10 Essential (primary) hypertension: Secondary | ICD-10-CM | POA: Diagnosis not present

## 2014-07-18 DIAGNOSIS — E291 Testicular hypofunction: Secondary | ICD-10-CM

## 2014-07-18 DIAGNOSIS — E1121 Type 2 diabetes mellitus with diabetic nephropathy: Secondary | ICD-10-CM

## 2014-07-18 DIAGNOSIS — Z125 Encounter for screening for malignant neoplasm of prostate: Secondary | ICD-10-CM | POA: Diagnosis not present

## 2014-07-18 LAB — HEMOGLOBIN: Hemoglobin: 15.9 g/dL (ref 13.0–17.0)

## 2014-07-18 LAB — POCT GLYCOSYLATED HEMOGLOBIN (HGB A1C): Hemoglobin A1C: 6.5

## 2014-07-18 MED ORDER — GLIPIZIDE ER 5 MG PO TB24: 5.0000 mg | ORAL_TABLET | Freq: Every day | ORAL | Status: DC

## 2014-07-18 MED ORDER — AMLODIPINE BESYLATE 10 MG PO TABS: 10.0000 mg | ORAL_TABLET | Freq: Every day | ORAL | Status: DC

## 2014-07-18 MED ORDER — METOPROLOL TARTRATE 100 MG PO TABS: 100.0000 mg | ORAL_TABLET | Freq: Two times a day (BID) | ORAL | Status: DC

## 2014-07-18 MED ORDER — LISINOPRIL 40 MG PO TABS: 40.0000 mg | ORAL_TABLET | Freq: Every day | ORAL | Status: DC

## 2014-07-18 NOTE — Progress Notes (Signed)
CC: Benjamin Powell is a 69 y.o. male is here for Diabetes   Subjective: HPI:   follow-up type 2 diabetes: Continues to take metformin 1 g twice a day and since I saw her last has begun taking 5 mg of glipizide with breakfast every day. No outside blood sugars to report. No polyuria or polyphagia or polydipsia. Not watching what he eats, admits to indulging in large amounts ofjellybeansover the past few nights.  Follow-up essential hypertension: Continues to take amlodipine clonidine hydrochlorothiazide lisinopril and metoprolol on a daily basis but no outside blood pressures report. Denies chest pain shortness of breath orthopnea nor peripheral edema  Follow-up hypogonadism: He wants to know if he can start taking testosterone supplementation. He's noticed that his muscle tone has diminished over the past 3 months in his arms. He denies any new weakness but does report decreased libido and fatigue during the day.   Review Of Systems Outlined In HPI  Past Medical History  Diagnosis Date  . Hypertension   . Diabetes mellitus   . Allergy     seasonal  . GERD (gastroesophageal reflux disease)   . Anxiety   . Sleep apnea     Past Surgical History  Procedure Laterality Date  . Ankle surgery  1993    left  . Tonsillectomy and adenoidectomy  1998    and sinus for sleep apnea  . Cholecystectomy  1990  . Ercp  1990   Family History  Problem Relation Age of Onset  . Stroke Brother   . Colitis Brother   . Pancreatic cancer Mother   . Colitis Sister     History   Social History  . Marital Status: Married    Spouse Name: N/A  . Number of Children: N/A  . Years of Education: N/A   Occupational History  . Not on file.   Social History Main Topics  . Smoking status: Current Every Day Smoker -- 0.30 packs/day for 50 years    Types: Cigarettes  . Smokeless tobacco: Never Used  . Alcohol Use: No  . Drug Use: No  . Sexual Activity: Not on file   Other Topics Concern  . Not on  file   Social History Narrative     Objective: BP 113/71 mmHg  Pulse 61  Wt 221 lb (100.245 kg)  General: Alert and Oriented, No Acute Distress HEENT: Pupils equal, round, reactive to light. Conjunctivae clear.  Moist mucous membranes Lungs: Clear to auscultation bilaterally, no wheezing/ronchi/rales.  Comfortable work of breathing. Good air movement. Cardiac: Regular rate and rhythm. Normal S1/S2.  No murmurs, rubs, nor gallops.   Extremities: No peripheral edema.  Strong peripheral pulses. Full range of motion and strength in upper and lower extremities Mental Status: No depression, anxiety, nor agitation. Skin: Warm and dry.  Assessment & Plan: [place] was seen today for diabetes.  Diagnoses and all orders for this visit:  Type 2 diabetes mellitus with diabetic nephropathy Orders: -     POCT HgB A1C -     glipiZIDE (GLIPIZIDE XL) 5 MG 24 hr tablet; Take 1 tablet (5 mg total) by mouth daily with breakfast.  Essential hypertension Orders: -     lisinopril (PRINIVIL,ZESTRIL) 40 MG tablet; Take 1 tablet (40 mg total) by mouth daily. -     metoprolol (LOPRESSOR) 100 MG tablet; Take 1 tablet (100 mg total) by mouth 2 (two) times daily. -     amLODipine (NORVASC) 10 MG tablet; Take 1 tablet (10 mg total)  by mouth daily.  Hypogonadism in male   Type 2 diabetes: A1c of 6.5, control continue metformin and glipizide Essential hypertension: Controlled continue all antihypertensives Hypogonadism: Uncontrolled, advised him to go downstairs to get a PSA, hemoglobin and testosterone level checked. If there is no contraindication to starting testosterone supplementation this will be available to him starting on Monday. I've asked him to start thinking about whether or not he would like to do topical therapy on a daily basis or injections every 2-3 weeks.  Return in about 3 months (around 10/18/2014).

## 2014-07-19 LAB — PSA: PSA: 3.12 ng/mL (ref <=4.00)

## 2014-07-19 LAB — TESTOSTERONE: TESTOSTERONE: 198 ng/dL — AB (ref 300–890)

## 2014-07-25 ENCOUNTER — Telehealth: Payer: Self-pay | Admitting: *Deleted

## 2014-07-25 NOTE — Telephone Encounter (Signed)
Pt left a message on vm that he wants to schedule an injection (testosterone injection). Called and transferred up front to schedule

## 2014-07-27 ENCOUNTER — Other Ambulatory Visit: Payer: Self-pay | Admitting: Family Medicine

## 2014-07-28 ENCOUNTER — Ambulatory Visit: Payer: Medicare Other

## 2014-08-04 ENCOUNTER — Ambulatory Visit (INDEPENDENT_AMBULATORY_CARE_PROVIDER_SITE_OTHER): Payer: Medicare Other | Admitting: Family Medicine

## 2014-08-04 VITALS — BP 119/77 | HR 57 | Wt 226.0 lb

## 2014-08-04 DIAGNOSIS — E291 Testicular hypofunction: Secondary | ICD-10-CM

## 2014-08-04 MED ORDER — TESTOSTERONE CYPIONATE 200 MG/ML IM SOLN
200.0000 mg | Freq: Once | INTRAMUSCULAR | Status: AC
  Administered 2014-08-04: 200 mg via INTRAMUSCULAR

## 2014-08-04 NOTE — Progress Notes (Signed)
   Subjective:    Patient ID: Benjamin Powell, male    DOB: [birth year], 69 y.o.   MRN: [id number]  HPI   Pt is here for his first  testosterone injection. Review of Systems     Objective:   Physical Exam        Assessment & Plan:  Pt is aware to come in for a nurse visit every 3 weeks for injection

## 2014-08-08 DIAGNOSIS — H02831 Dermatochalasis of right upper eyelid: Secondary | ICD-10-CM | POA: Diagnosis not present

## 2014-08-08 DIAGNOSIS — H2511 Age-related nuclear cataract, right eye: Secondary | ICD-10-CM | POA: Diagnosis not present

## 2014-08-08 DIAGNOSIS — H2512 Age-related nuclear cataract, left eye: Secondary | ICD-10-CM | POA: Diagnosis not present

## 2014-08-08 DIAGNOSIS — H31093 Other chorioretinal scars, bilateral: Secondary | ICD-10-CM | POA: Diagnosis not present

## 2014-08-08 DIAGNOSIS — E119 Type 2 diabetes mellitus without complications: Secondary | ICD-10-CM | POA: Diagnosis not present

## 2014-08-08 LAB — HM DIABETES EYE EXAM

## 2014-08-15 ENCOUNTER — Ambulatory Visit (INDEPENDENT_AMBULATORY_CARE_PROVIDER_SITE_OTHER): Payer: Medicare Other | Admitting: Family Medicine

## 2014-08-15 VITALS — BP 130/76 | HR 51 | Ht 72.0 in | Wt 230.0 lb

## 2014-08-15 DIAGNOSIS — E291 Testicular hypofunction: Secondary | ICD-10-CM

## 2014-08-15 MED ORDER — TESTOSTERONE CYPIONATE 200 MG/ML IM SOLN: 200.0000 mg | INTRAMUSCULAR | Status: DC

## 2014-08-15 MED ORDER — TESTOSTERONE CYPIONATE 200 MG/ML IM SOLN
200.0000 mg | Freq: Once | INTRAMUSCULAR | Status: AC
  Administered 2014-08-15: 200 mg via INTRAMUSCULAR

## 2014-08-15 NOTE — Progress Notes (Signed)
   Subjective:    Patient ID: Benjamin Powell, male    DOB: 30-May-1945, 69 y.o.   MRN: [id number]  HPI Patient came into office today for testosterone injection. Denies chest pain, shortness of breath, headaches and problems associated with taking this medication. Patient states he has had no abnornal mood swings.    Review of Systems     Objective:   Physical Exam        Assessment & Plan:  Patient tolerated injection in [place] well without complications. Patient advised to schedule his next injection for 2 weeks from today.

## 2014-08-19 ENCOUNTER — Encounter: Payer: Self-pay | Admitting: Family Medicine

## 2014-08-25 ENCOUNTER — Other Ambulatory Visit: Payer: Self-pay | Admitting: Family Medicine

## 2014-08-26 DIAGNOSIS — M47812 Spondylosis without myelopathy or radiculopathy, cervical region: Secondary | ICD-10-CM | POA: Diagnosis not present

## 2014-08-26 DIAGNOSIS — G8921 Chronic pain due to trauma: Secondary | ICD-10-CM | POA: Diagnosis not present

## 2014-08-28 DIAGNOSIS — H2511 Age-related nuclear cataract, right eye: Secondary | ICD-10-CM | POA: Diagnosis not present

## 2014-08-29 ENCOUNTER — Ambulatory Visit (INDEPENDENT_AMBULATORY_CARE_PROVIDER_SITE_OTHER): Payer: Medicare Other | Admitting: Sports Medicine

## 2014-08-29 VITALS — BP 124/76 | HR 89 | Ht 72.0 in | Wt 233.0 lb

## 2014-08-29 DIAGNOSIS — E291 Testicular hypofunction: Secondary | ICD-10-CM | POA: Diagnosis not present

## 2014-08-29 MED ORDER — TESTOSTERONE CYPIONATE 200 MG/ML IM SOLN
200.0000 mg | Freq: Once | INTRAMUSCULAR | Status: AC
  Administered 2014-08-29: 200 mg via INTRAMUSCULAR

## 2014-08-29 NOTE — Progress Notes (Signed)
   Subjective:    Patient ID: Benjamin Powell, male    DOB: 1945-10-09, 69 y.o.   MRN: [id number]  HPI Patient came into office today for testosterone injection. Denies chest pain, shortness of breath, headaches and problems associated with taking this medication. Patient states he has had no abnornal mood swings.    Review of Systems     Objective:   Physical Exam        Assessment & Plan:  Patient tolerated injection in [place] well without complications. Patient advised to schedule his next injection for 2 weeks from today.

## 2014-08-29 NOTE — Assessment & Plan Note (Signed)
Testosterone injection as above. 

## 2014-09-12 ENCOUNTER — Ambulatory Visit (INDEPENDENT_AMBULATORY_CARE_PROVIDER_SITE_OTHER): Payer: Medicare Other | Admitting: Family Medicine

## 2014-09-12 VITALS — BP 143/87 | HR 50 | Ht 72.0 in | Wt 230.0 lb

## 2014-09-12 DIAGNOSIS — E291 Testicular hypofunction: Secondary | ICD-10-CM | POA: Diagnosis not present

## 2014-09-12 MED ORDER — TESTOSTERONE CYPIONATE 200 MG/ML IM SOLN
200.0000 mg | Freq: Once | INTRAMUSCULAR | Status: AC
  Administered 2014-09-12: 200 mg via INTRAMUSCULAR

## 2014-09-12 NOTE — Progress Notes (Signed)
   Subjective:    Patient ID: Benjamin Powell, male    DOB: Sep 12, 1945, 69 y.o.   MRN: [id number]  HPI Patient came into office today for testosterone injection. Denies chest pain, shortness of breath, headaches and problems associated with taking this medication. Patient states he has had no abnornal mood swings.    Review of Systems     Objective:   Physical Exam        Assessment & Plan:  Patient tolerated injection in [place] well without complications. Patient advised to schedule his next injection for 2 weeks from today.

## 2014-09-26 ENCOUNTER — Ambulatory Visit (INDEPENDENT_AMBULATORY_CARE_PROVIDER_SITE_OTHER): Payer: Medicare Other | Admitting: Family Medicine

## 2014-09-26 VITALS — BP 128/78 | HR 57 | Wt 232.0 lb

## 2014-09-26 DIAGNOSIS — E291 Testicular hypofunction: Secondary | ICD-10-CM

## 2014-09-26 MED ORDER — TESTOSTERONE CYPIONATE 200 MG/ML IM SOLN
200.0000 mg | Freq: Once | INTRAMUSCULAR | Status: AC
  Administered 2014-09-26: 200 mg via INTRAMUSCULAR

## 2014-09-26 NOTE — Progress Notes (Signed)
   Subjective:    Patient ID: Benjamin Powell, male    DOB: 1945/11/24, 69 y.o.   MRN: [id number]  HPI  Patient came into office today for testosterone injection. Denies chest pain, shortness of breath, headaches and problems associated with taking this medication. Patient states he has had no abnornal mood swings.   Review of Systems     Objective:   Physical Exam        Assessment & Plan:  Patient tolerated injection in [place] well without complications. Patient advised to schedule his next injection for 2 weeks from today.

## 2014-10-10 ENCOUNTER — Ambulatory Visit (INDEPENDENT_AMBULATORY_CARE_PROVIDER_SITE_OTHER): Payer: Medicare Other | Admitting: Family Medicine

## 2014-10-10 VITALS — BP 154/89 | HR 56 | Wt 223.0 lb

## 2014-10-10 DIAGNOSIS — E291 Testicular hypofunction: Secondary | ICD-10-CM | POA: Diagnosis not present

## 2014-10-10 MED ORDER — TESTOSTERONE CYPIONATE 200 MG/ML IM SOLN
200.0000 mg | Freq: Once | INTRAMUSCULAR | Status: AC
  Administered 2014-10-10: 200 mg via INTRAMUSCULAR

## 2014-10-10 NOTE — Progress Notes (Signed)
   Subjective:    Patient ID: Benjamin Powell, male    DOB: 06/07/1945, 69 y.o.   MRN: [id number]  HPI  Patient came into office today for testosterone injection. Denies chest pain, shortness of breath, headaches and problems associated with taking this medication. Patient states he has had no abnornal mood swings.   Review of Systems     Objective:   Physical Exam        Assessment & Plan:  Patient tolerated injection in [place] well without complications. Patient advised to schedule his next injection for 2 weeks from today.

## 2014-10-20 ENCOUNTER — Ambulatory Visit: Payer: Medicare Other | Admitting: Family Medicine

## 2014-10-24 ENCOUNTER — Ambulatory Visit (INDEPENDENT_AMBULATORY_CARE_PROVIDER_SITE_OTHER): Payer: Medicare Other | Admitting: Family Medicine

## 2014-10-24 ENCOUNTER — Other Ambulatory Visit: Payer: Self-pay | Admitting: Family Medicine

## 2014-10-24 VITALS — BP 151/94 | HR 57 | Wt 233.0 lb

## 2014-10-24 DIAGNOSIS — E291 Testicular hypofunction: Secondary | ICD-10-CM

## 2014-10-24 MED ORDER — TESTOSTERONE CYPIONATE 200 MG/ML IM SOLN
200.0000 mg | Freq: Once | INTRAMUSCULAR | Status: AC
  Administered 2014-10-24: 200 mg via INTRAMUSCULAR

## 2014-10-24 NOTE — Progress Notes (Signed)
   Subjective:    Patient ID: Benjamin Powell, male    DOB: 1945/09/08, 69 y.o.   MRN: [id number]  HPI  Patient came into office today for testosterone injection. Denies chest pain, shortness of breath, headaches and problems associated with taking this medication. Patient states he has had no abnornal mood swings. Pt BP was high today, reports taking his BP Rx "almost 30 min ago."  Review of Systems     Objective:   Physical Exam        Assessment & Plan:  Patient tolerated injection in [place] well without complications. Patient advised to schedule his next injection for 2 weeks from today.

## 2014-10-27 ENCOUNTER — Other Ambulatory Visit: Payer: Self-pay | Admitting: Family Medicine

## 2014-10-27 ENCOUNTER — Other Ambulatory Visit: Payer: Self-pay | Admitting: *Deleted

## 2014-10-27 DIAGNOSIS — I10 Essential (primary) hypertension: Secondary | ICD-10-CM

## 2014-10-27 MED ORDER — CLONIDINE HCL 0.3 MG PO TABS: 0.3000 mg | ORAL_TABLET | Freq: Two times a day (BID) | ORAL | Status: DC

## 2014-10-29 ENCOUNTER — Other Ambulatory Visit: Payer: Self-pay | Admitting: Family Medicine

## 2014-10-31 ENCOUNTER — Ambulatory Visit (INDEPENDENT_AMBULATORY_CARE_PROVIDER_SITE_OTHER): Payer: Medicare Other | Admitting: Family Medicine

## 2014-10-31 ENCOUNTER — Telehealth: Payer: Self-pay | Admitting: Family Medicine

## 2014-10-31 ENCOUNTER — Encounter: Payer: Self-pay | Admitting: Family Medicine

## 2014-10-31 VITALS — BP 149/91 | HR 52 | Wt 234.0 lb

## 2014-10-31 DIAGNOSIS — I10 Essential (primary) hypertension: Secondary | ICD-10-CM

## 2014-10-31 DIAGNOSIS — G478 Other sleep disorders: Secondary | ICD-10-CM

## 2014-10-31 DIAGNOSIS — F513 Sleepwalking [somnambulism]: Secondary | ICD-10-CM

## 2014-10-31 DIAGNOSIS — E1121 Type 2 diabetes mellitus with diabetic nephropathy: Secondary | ICD-10-CM | POA: Diagnosis not present

## 2014-10-31 DIAGNOSIS — E291 Testicular hypofunction: Secondary | ICD-10-CM

## 2014-10-31 DIAGNOSIS — E118 Type 2 diabetes mellitus with unspecified complications: Secondary | ICD-10-CM | POA: Diagnosis not present

## 2014-10-31 DIAGNOSIS — Z125 Encounter for screening for malignant neoplasm of prostate: Secondary | ICD-10-CM | POA: Diagnosis not present

## 2014-10-31 LAB — HEMOGLOBIN: HEMOGLOBIN: 17.5 g/dL — AB (ref 13.0–17.0)

## 2014-10-31 MED ORDER — GLIPIZIDE ER 5 MG PO TB24: 5.0000 mg | ORAL_TABLET | Freq: Every day | ORAL | Status: DC

## 2014-10-31 MED ORDER — CITALOPRAM HYDROBROMIDE 20 MG PO TABS: ORAL_TABLET | ORAL | Status: DC

## 2014-10-31 MED ORDER — CLONIDINE HCL 0.3 MG PO TABS: 0.3000 mg | ORAL_TABLET | Freq: Two times a day (BID) | ORAL | Status: DC

## 2014-10-31 NOTE — Telephone Encounter (Signed)
Seth Bake, Will you please let patient know that the doctor downstairs that I was referring to is no longer managing sleeping issues with his patients. I placed a referral to go to [facility] provider that one of my colleagues has been using.  He'll end up seeing a neurologist who specializes in sleep medicine. Please contact me if not notified about this within the next week

## 2014-10-31 NOTE — Progress Notes (Signed)
CC: Benjamin Powell is a 69 y.o. male is here for mood   Subjective: HPI:  Follow-up hypogonadism: Patient believes is getting a little bit more energy with testosterone use however still has nonrestorative sleep and his wife believes that he is been a little bit more angry and irritable towards others. He has not been physically abusive to anybody but he does raise his voice more often than what the family is used to. Patient admits that he does feel a little bit more irritable towards the world. He denies any depression or anxiety.  Follow-up type 2 diabetes: outside blood sugars report. Currently taking metformin and glipizide denies any known side effects or poorly healing wounds. No polyuria polyphagia or polydipsia  Follow-up/hypertension: No outside blood pressures to report. Itching is on amlodipine and clonidine hydrochlorothiazide lisinopril and metoprolol. Denies chest pain shortness of breath orthopnea nor peripheral edema  The patient and his wife are most concerned about sleepwalking that began about 2 years ago which is getting out of hand. Patient denies any recollection of any of these events but believes his wife is telling the truth. A few nights out of the week he will get up out of bed and start crawling around on the floor or act like he is looking for something. Many times during the week either while he is about to fall asleep or while he is in the process of waking up he will be up talking to family members and talking nonsense. For instance he'll want to start a conversation about why the cat is wearing clothes. He'll also be under the impression that he has to go meet with somebody or that he needs to get ready for visitors. He tells me he has absolutely no recollection of this. During the day when he is actually awake he denies any hallucinations disorientation or confusion.symptoms began around the time he started metoprolol  Review Of Systems Outlined In HPI  Past  Medical History  Diagnosis Date  . Hypertension   . Diabetes mellitus   . Allergy     seasonal  . GERD (gastroesophageal reflux disease)   . Anxiety   . Sleep apnea     Past Surgical History  Procedure Laterality Date  . Ankle surgery  1993    left  . Tonsillectomy and adenoidectomy  1998    and sinus for sleep apnea  . Cholecystectomy  1990  . Ercp  1990   Family History  Problem Relation Age of Onset  . Stroke Brother   . Colitis Brother   . Pancreatic cancer Mother   . Colitis Sister     History   Social History  . Marital Status: Married    Spouse Name: N/A  . Number of Children: N/A  . Years of Education: N/A   Occupational History  . Not on file.   Social History Main Topics  . Smoking status: Current Every Day Smoker -- 0.30 packs/day for 50 years    Types: Cigarettes  . Smokeless tobacco: Never Used  . Alcohol Use: No  . Drug Use: No  . Sexual Activity: Not on file   Other Topics Concern  . Not on file   Social History Narrative     Objective: BP 149/91 mmHg  Pulse 52  Wt 234 lb (106.142 kg)  Vital signs reviewed. General: Alert and Oriented, No Acute Distress HEENT: Pupils equal, round, reactive to light. Conjunctivae clear.  External ears unremarkable.  Moist mucous membranes. Lungs: Clear  and comfortable work of breathing, speaking in full sentences without accessory muscle use. Cardiac: Regular rate and rhythm.  Neuro: CN II-XII grossly intact, gait normal. Extremities: No peripheral edema.  Strong peripheral pulses.  Mental Status: No depression, anxiety, nor agitation. Logical though process. Skin: Warm and dry.  Assessment & Plan: Keilen was seen today for mood.  Diagnoses and all orders for this visit:  Hypogonadism in male Orders: -     Testosterone -     PSA -     Hemoglobin  Type 2 diabetes mellitus with complication Orders: -     HgB A1c  Type 2 diabetes mellitus with diabetic nephropathy Orders: -     glipiZIDE  (GLIPIZIDE XL) 5 MG 24 hr tablet; Take 1 tablet (5 mg total) by mouth daily with breakfast.  Essential hypertension Orders: -     cloNIDine (CATAPRES) 0.3 MG tablet; Take 1 tablet (0.3 mg total) by mouth 2 (two) times daily.  Sleepwalking Orders: -     Ambulatory referral to Neurology  Non-restorative sleep Orders: -     Ambulatory referral to Neurology  Other orders -     citalopram (CELEXA) 20 MG tablet; TAKE 2 TABLETS (40 MG TOTAL) BY MOUTH DAILY.   Hypogonadism: Checking testosterone level, it could be that his irritability is because his testosterone levels a little too high, will make a decision of adjusting the dose based on his results today Type 2 diabetes: Clinically controlled due for repeat A1c Essential hypertension: Uncontrolled, there is room for improvement with respect to reducing salt therefore asked him to focus on that before changing medications Sleepwalking: referral to sleep specialist, it sounds like there could be a component of sleep apnea here but he hasn't found time to go back and do a second attempt at a sleep study  Return if symptoms worsen or fail to improve.

## 2014-11-01 LAB — HEMOGLOBIN A1C
HEMOGLOBIN A1C: 6.5 % — AB (ref <5.7)
MEAN PLASMA GLUCOSE: 140 mg/dL — AB (ref <117)

## 2014-11-01 LAB — PSA: PSA: 2.35 ng/mL (ref <=4.00)

## 2014-11-01 LAB — TESTOSTERONE: Testosterone: 633 ng/dL (ref 300–890)

## 2014-11-03 ENCOUNTER — Telehealth: Payer: Self-pay | Admitting: Family Medicine

## 2014-11-03 DIAGNOSIS — E291 Testicular hypofunction: Secondary | ICD-10-CM

## 2014-11-03 NOTE — Telephone Encounter (Signed)
lvm with results and recommendations. Pt advised to rtn call with regards to the sleep specialist.Oluchi Pucci, Lahoma Crocker

## 2014-11-03 NOTE — Telephone Encounter (Signed)
Seth Bake, Will you please let patient know that his A1c is under control and his PSA prostate test is normal.  His testosterone was now in the normal range but due to an elevated hemoglobin level I'd recommend spacing his testosterone shots out to every three weeks instead of every two weeks.  Please let me know if not contacted about his sleep specialist appointment by the end of the week.

## 2014-11-03 NOTE — Telephone Encounter (Signed)
Left message on vm

## 2014-11-07 ENCOUNTER — Ambulatory Visit (INDEPENDENT_AMBULATORY_CARE_PROVIDER_SITE_OTHER): Payer: Medicare Other | Admitting: Family Medicine

## 2014-11-07 VITALS — BP 171/93 | HR 73 | Ht 72.0 in | Wt 233.0 lb

## 2014-11-07 DIAGNOSIS — E291 Testicular hypofunction: Secondary | ICD-10-CM | POA: Diagnosis not present

## 2014-11-07 MED ORDER — TESTOSTERONE CYPIONATE 200 MG/ML IM SOLN
200.0000 mg | Freq: Once | INTRAMUSCULAR | Status: AC
  Administered 2014-11-07: 200 mg via INTRAMUSCULAR

## 2014-11-07 NOTE — Progress Notes (Signed)
   Subjective:    Patient ID: Benjamin Powell, male    DOB: 1945/09/14, 69 y.o.   MRN: [id number]  HPI Patient is here for a testosterone injection. Denies chest pain, SOB, headaches and problems with medications or mood changes.   Review of Systems     Objective:   Physical Exam        Assessment & Plan:  Patient tolerated injection well in the [place] with no complications. Patient advised to schedule next injection in 21 days per recommendation in the last telephone note.

## 2014-11-25 ENCOUNTER — Institutional Professional Consult (permissible substitution): Payer: Medicare Other | Admitting: Neurology

## 2014-11-28 ENCOUNTER — Ambulatory Visit (INDEPENDENT_AMBULATORY_CARE_PROVIDER_SITE_OTHER): Payer: Medicare Other | Admitting: Sports Medicine

## 2014-11-28 VITALS — BP 121/74 | HR 57 | Wt 231.0 lb

## 2014-11-28 DIAGNOSIS — E291 Testicular hypofunction: Secondary | ICD-10-CM

## 2014-11-28 MED ORDER — TESTOSTERONE CYPIONATE 200 MG/ML IM SOLN
200.0000 mg | Freq: Once | INTRAMUSCULAR | Status: AC
  Administered 2014-11-28: 200 mg via INTRAMUSCULAR

## 2014-11-28 NOTE — Progress Notes (Signed)
   Subjective:    Patient ID: Benjamin Powell, male    DOB: [birth year], 69 y.o.   MRN: [id number]  HPI   Pt is here for testosterone injection. Pt denies any changes in BP, dizziness, ect.  Review of Systems     Objective:   Physical Exam        Assessment & Plan:  Injection given without complication

## 2014-11-28 NOTE — Assessment & Plan Note (Signed)
Testosterone injection as above. 

## 2014-12-04 ENCOUNTER — Other Ambulatory Visit: Payer: Self-pay | Admitting: Family Medicine

## 2014-12-04 MED ORDER — HYDROCODONE-ACETAMINOPHEN 10-325 MG PO TABS: 1.0000 | ORAL_TABLET | Freq: Every evening | ORAL | Status: DC | PRN

## 2014-12-04 MED ORDER — TRAMADOL HCL 50 MG PO TABS: 50.0000 mg | ORAL_TABLET | Freq: Three times a day (TID) | ORAL | Status: DC | PRN

## 2014-12-04 NOTE — Telephone Encounter (Signed)
Andrea, Rx placed in in-box ready for pickup/faxing.  

## 2014-12-19 ENCOUNTER — Ambulatory Visit: Payer: Medicare Other

## 2014-12-29 ENCOUNTER — Ambulatory Visit (INDEPENDENT_AMBULATORY_CARE_PROVIDER_SITE_OTHER): Payer: Medicare Other | Admitting: Family Medicine

## 2014-12-29 VITALS — BP 143/89 | HR 56 | Wt 230.0 lb

## 2014-12-29 DIAGNOSIS — E291 Testicular hypofunction: Secondary | ICD-10-CM

## 2014-12-29 MED ORDER — TESTOSTERONE CYPIONATE 200 MG/ML IM SOLN
200.0000 mg | Freq: Once | INTRAMUSCULAR | Status: AC
  Administered 2014-12-29: 200 mg via INTRAMUSCULAR

## 2014-12-29 NOTE — Progress Notes (Signed)
   Subjective:    Patient ID: Benjamin Powell, male    DOB: 03-Oct-1945, 69 y.o.   MRN: [id number]  HPI  Benjamin Powell is here for a testosterone injection. Denies chest pain, shortness of breath, headaches or mood changes. Patient would like to switch back to every two weeks instead of every 3 weeks.   Review of Systems     Objective:   Physical Exam        Assessment & Plan:  Patient tolerated injection well without complications. Patient advised to schedule next injection 14 days from today.

## 2015-01-05 ENCOUNTER — Institutional Professional Consult (permissible substitution): Payer: Medicare Other | Admitting: Neurology

## 2015-01-05 ENCOUNTER — Other Ambulatory Visit: Payer: Self-pay | Admitting: Family Medicine

## 2015-01-13 ENCOUNTER — Ambulatory Visit: Payer: Medicare Other

## 2015-01-16 ENCOUNTER — Ambulatory Visit (INDEPENDENT_AMBULATORY_CARE_PROVIDER_SITE_OTHER): Payer: Medicare Other | Admitting: Family Medicine

## 2015-01-16 VITALS — BP 137/85 | HR 55 | Wt 227.0 lb

## 2015-01-16 DIAGNOSIS — E291 Testicular hypofunction: Secondary | ICD-10-CM

## 2015-01-16 MED ORDER — TESTOSTERONE CYPIONATE 200 MG/ML IM SOLN
200.0000 mg | Freq: Once | INTRAMUSCULAR | Status: AC
  Administered 2015-01-16: 200 mg via INTRAMUSCULAR

## 2015-01-16 NOTE — Progress Notes (Signed)
   Subjective:    Patient ID: Benjamin Powell, male    DOB: 01/11/1946, 69 y.o.   MRN: [id number]  HPI  Patient came into office today for testosterone injection. Denies chest pain, shortness of breath, headaches and problems associated with taking this medication. Patient states he has had no abnornal mood swings.   Review of Systems     Objective:   Physical Exam        Assessment & Plan:  Patient tolerated injection in [place] well without complications. Patient advised to schedule his next injection for 2 weeks from today.

## 2015-01-22 ENCOUNTER — Other Ambulatory Visit: Payer: Self-pay | Admitting: Family Medicine

## 2015-02-02 ENCOUNTER — Institutional Professional Consult (permissible substitution): Payer: Medicare Other | Admitting: Neurology

## 2015-02-06 ENCOUNTER — Ambulatory Visit (INDEPENDENT_AMBULATORY_CARE_PROVIDER_SITE_OTHER): Payer: Medicare Other | Admitting: Family Medicine

## 2015-02-06 ENCOUNTER — Encounter: Payer: Self-pay | Admitting: Family Medicine

## 2015-02-06 VITALS — BP 122/81 | HR 66 | Wt 226.0 lb

## 2015-02-06 DIAGNOSIS — E291 Testicular hypofunction: Secondary | ICD-10-CM

## 2015-02-06 MED ORDER — TESTOSTERONE CYPIONATE 200 MG/ML IM SOLN: 200.0000 mg | Freq: Once | INTRAMUSCULAR | Status: DC

## 2015-02-06 NOTE — Progress Notes (Signed)
   Subjective:    Patient ID: Benjamin Powell, male    DOB: 04/03/1946, 69 y.o.   MRN: [id number]  HPI  Patient is here for a testosterone injection.  Denies chest pain, shortness of Breath, headaches, mood changes and problems with medication.  Review of Systems     Objective:   Physical Exam        Assessment & Plan:   Patient tolerated injection well without any complications.  Patient advised to schedule next injection in 14 days.

## 2015-02-16 ENCOUNTER — Other Ambulatory Visit: Payer: Self-pay | Admitting: Family Medicine

## 2015-02-16 MED ORDER — HYDROCODONE-ACETAMINOPHEN 10-325 MG PO TABS: 1.0000 | ORAL_TABLET | Freq: Every evening | ORAL | Status: DC | PRN

## 2015-02-20 ENCOUNTER — Other Ambulatory Visit: Payer: Self-pay | Admitting: *Deleted

## 2015-02-20 ENCOUNTER — Ambulatory Visit: Payer: Medicare Other

## 2015-02-20 ENCOUNTER — Ambulatory Visit (INDEPENDENT_AMBULATORY_CARE_PROVIDER_SITE_OTHER): Payer: Medicare Other | Admitting: Family Medicine

## 2015-02-20 ENCOUNTER — Ambulatory Visit (INDEPENDENT_AMBULATORY_CARE_PROVIDER_SITE_OTHER): Payer: Medicare Other

## 2015-02-20 ENCOUNTER — Encounter: Payer: Self-pay | Admitting: Family Medicine

## 2015-02-20 VITALS — BP 135/75 | HR 66 | Wt 228.0 lb

## 2015-02-20 DIAGNOSIS — T148 Other injury of unspecified body region: Secondary | ICD-10-CM | POA: Diagnosis not present

## 2015-02-20 DIAGNOSIS — M7989 Other specified soft tissue disorders: Secondary | ICD-10-CM

## 2015-02-20 DIAGNOSIS — M25571 Pain in right ankle and joints of right foot: Secondary | ICD-10-CM | POA: Diagnosis not present

## 2015-02-20 DIAGNOSIS — T148XXA Other injury of unspecified body region, initial encounter: Secondary | ICD-10-CM | POA: Insufficient documentation

## 2015-02-20 DIAGNOSIS — S99921A Unspecified injury of right foot, initial encounter: Secondary | ICD-10-CM

## 2015-02-20 DIAGNOSIS — E291 Testicular hypofunction: Secondary | ICD-10-CM | POA: Diagnosis not present

## 2015-02-20 IMAGING — CR DG FOOT COMPLETE 3+V*R*
3 series · 3 of 3 positions shown · non-contrast
Comparison: None.

CLINICAL DATA: Fall last night, jammed great toe. Redness, pain,
swelling.

EXAM:
RIGHT FOOT COMPLETE - 3+ VIEW

[foot ap]
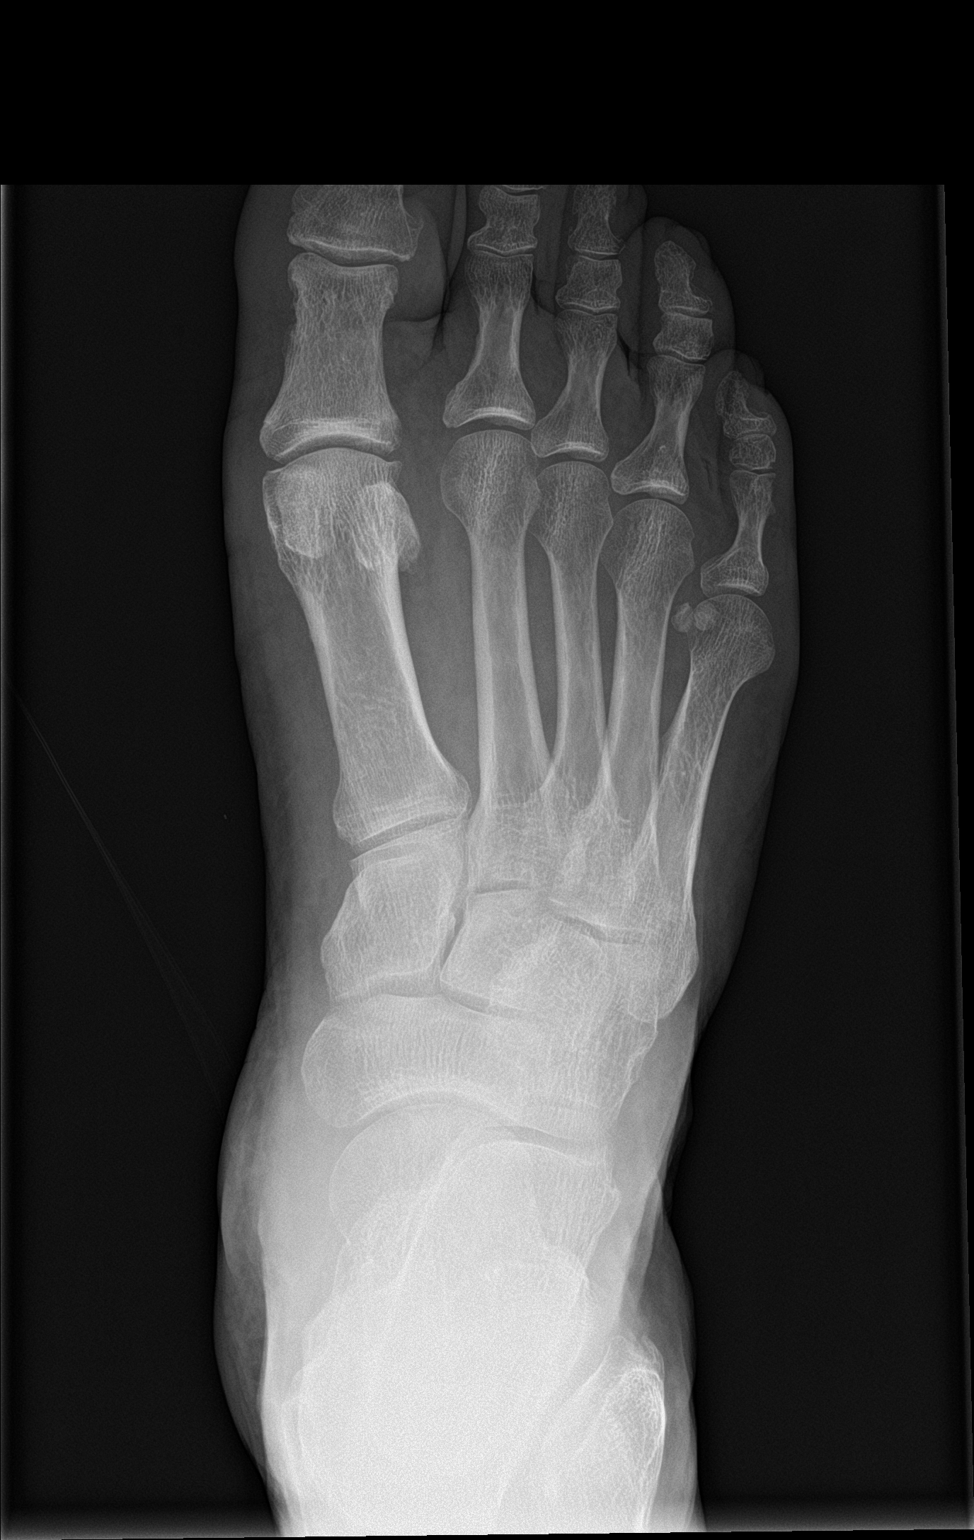

[foot obl]
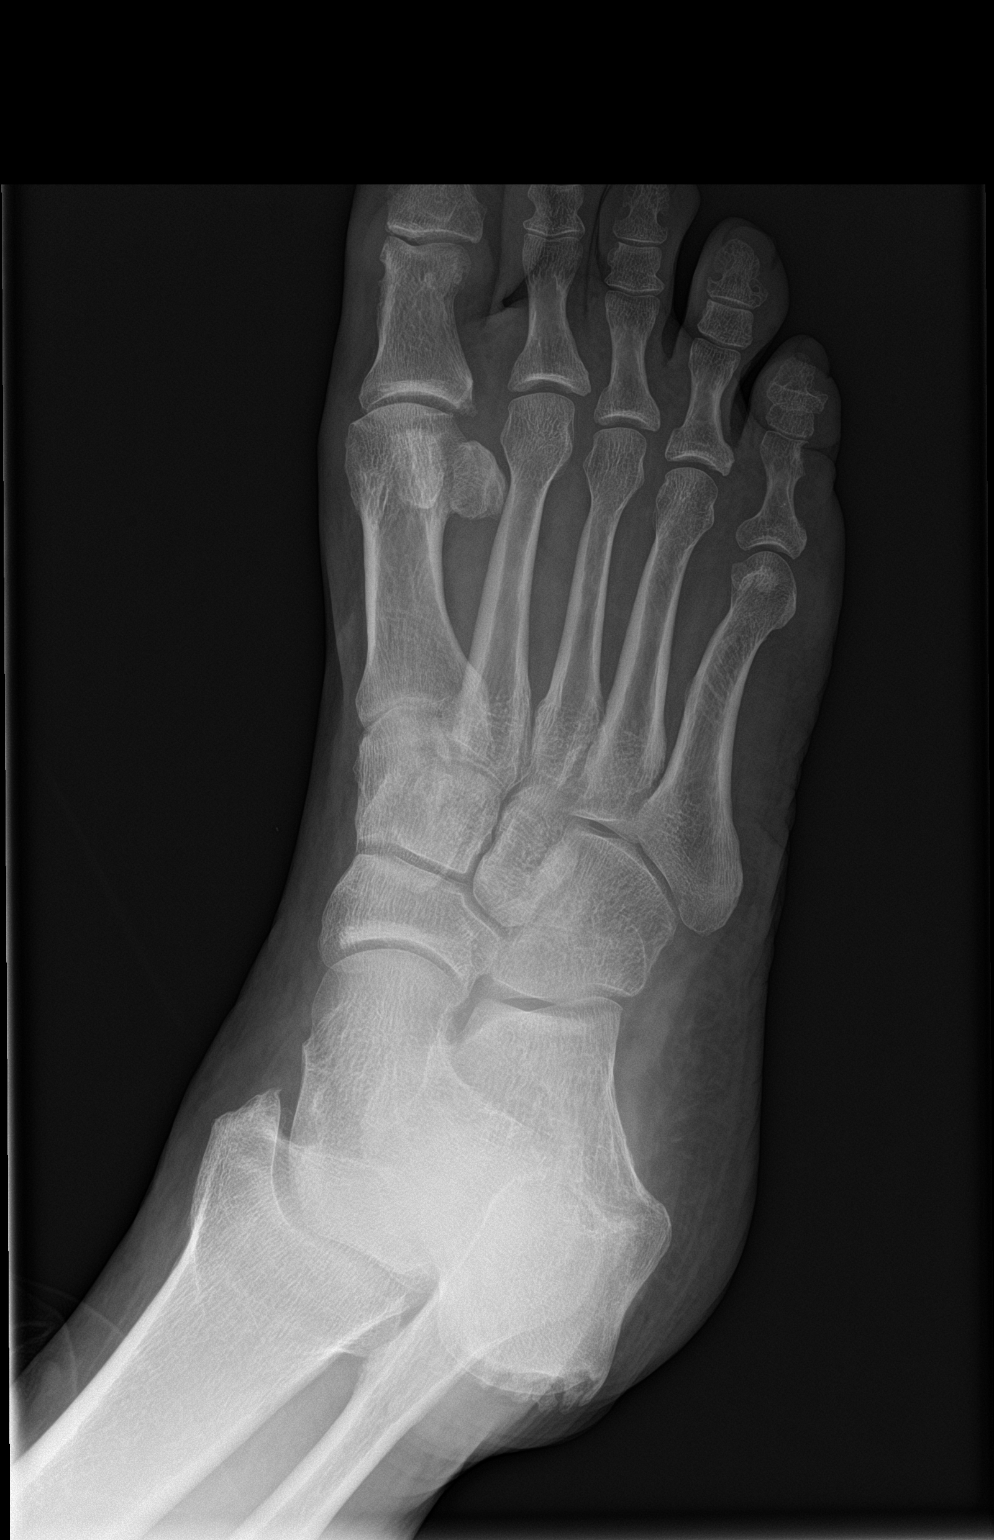

[foot lat]
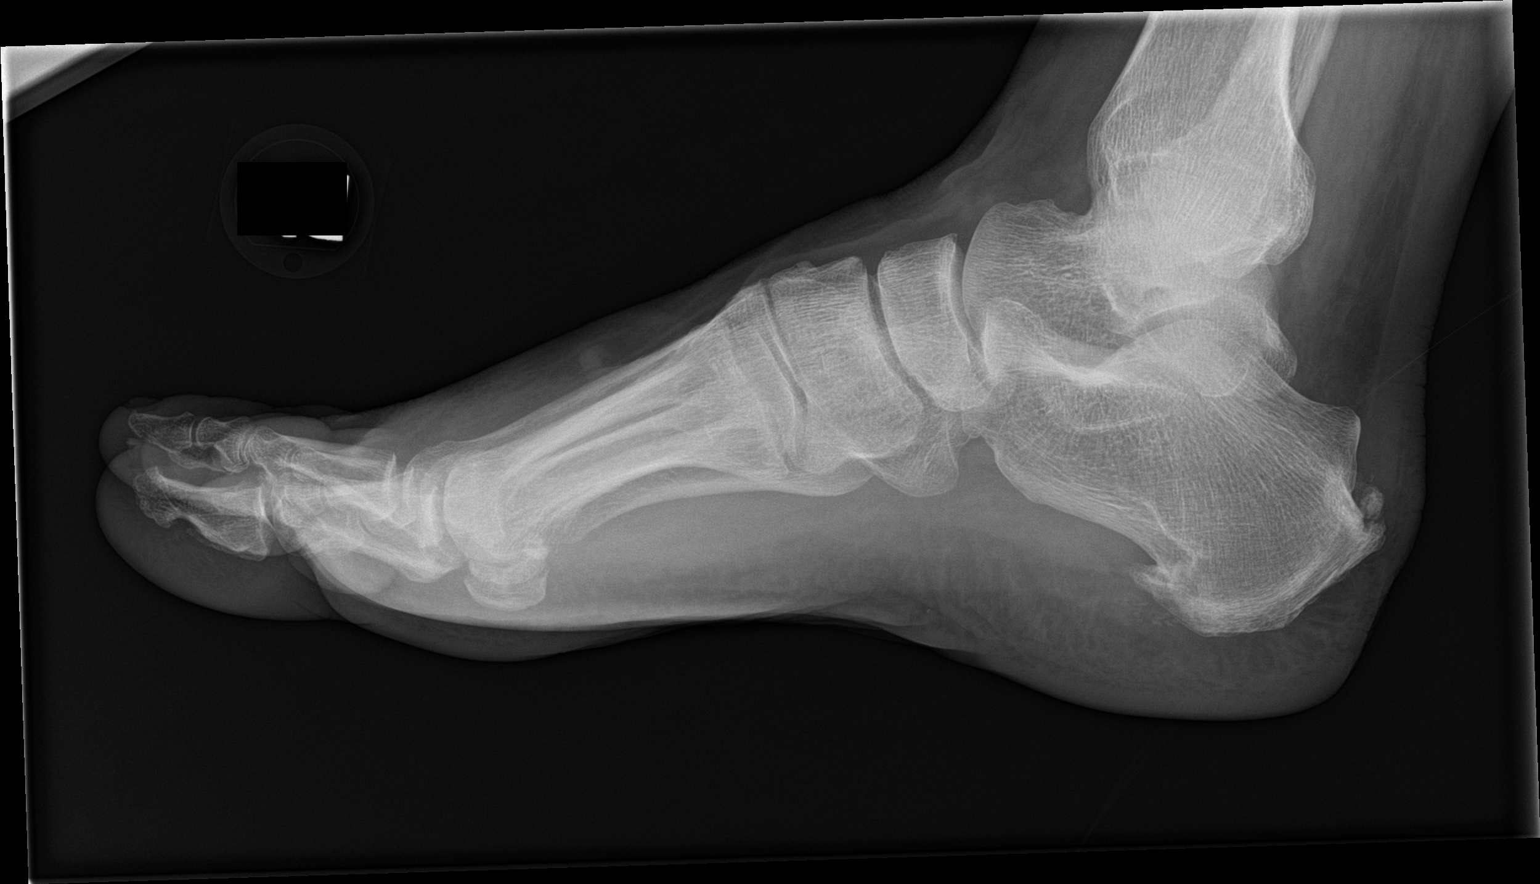

[3 of 3 positions shown; findings below may reference images not displayed]

FINDINGS: No acute bony abnormality. Specifically, no fracture, subluxation,
or dislocation. Soft tissues are intact.
IMPRESSION: No acute bony abnormality.

## 2015-02-20 MED ORDER — TRAMADOL HCL 50 MG PO TABS: 50.0000 mg | ORAL_TABLET | Freq: Three times a day (TID) | ORAL | Status: DC | PRN

## 2015-02-20 MED ORDER — TESTOSTERONE CYPIONATE 200 MG/ML IM SOLN
200.0000 mg | Freq: Once | INTRAMUSCULAR | Status: AC
  Administered 2015-02-20: 200 mg via INTRAMUSCULAR

## 2015-02-20 NOTE — Patient Instructions (Signed)
Thank you for coming in today. Do not pop the blister if you can avoid it.  Return soon if the wound becomes very red and painful.  Use tylenol for pain as needed.

## 2015-02-20 NOTE — Progress Notes (Signed)
Benjamin Powell is a 69 y.o. male who presents to [facility]: [facility]  today for a possible broken right big toe. Benjamin Powell states that he was walking in his bedroom and tripped backwards over his dog, which caused him to lose his balance and fall forward and jamming his toe into the floor. He states that yesterday he thought it was broken but today he thinks it's getting better and it may just be jammed. He has not used any medication for pain and states that it is tolerable at this point. Pain is mild to moderate. He is not interested in using a rigid soled shoe if he does not have to.  Additionally, he would like to receive a testosterone shot today.   Past Medical History  Diagnosis Date  . Hypertension   . Diabetes mellitus   . Allergy     seasonal  . GERD (gastroesophageal reflux disease)   . Anxiety   . Sleep apnea    Past Surgical History  Procedure Laterality Date  . Ankle surgery  1993    left  . Tonsillectomy and adenoidectomy  1998    and sinus for sleep apnea  . Cholecystectomy  1990  . Ercp  1990   Social History  Substance Use Topics  . Smoking status: Current Every Day Smoker -- 0.30 packs/day for 50 years    Types: Cigarettes  . Smokeless tobacco: Never Used  . Alcohol Use: No   family history includes Colitis in his brother and sister; Pancreatic cancer in his mother; Stroke in his brother.  ROS as above Medications: Current Outpatient Prescriptions  Medication Sig Dispense Refill  . amLODipine (NORVASC) 10 MG tablet Take 1 tablet (10 mg total) by mouth daily. 90 tablet 3  . aspirin 81 MG tablet Take 81 mg by mouth daily.    . citalopram (CELEXA) 20 MG tablet TAKE 2 TABLETS (40 MG TOTAL) BY MOUTH DAILY. 180 tablet 0  . cloNIDine (CATAPRES) 0.3 MG tablet Take 1 tablet (0.3 mg total) by mouth 2 (two) times daily. 180 tablet 3  . diclofenac (VOLTAREN) 75 MG EC tablet Take 1 tablet (75 mg total) by mouth 2 (two) times daily. 60  tablet 3  . glipiZIDE (GLIPIZIDE XL) 5 MG 24 hr tablet Take 1 tablet (5 mg total) by mouth daily with breakfast. 30 tablet 5  . hydrochlorothiazide (HYDRODIURIL) 25 MG tablet Take 1 tablet (25 mg total) by mouth daily. Patient needs to schedule a follow up appointment before more refills. 90 tablet 0  . HYDROcodone-acetaminophen (NORCO) 10-325 MG tablet Take 1 tablet by mouth at bedtime as needed. 30 tablet 0  . lisinopril (PRINIVIL,ZESTRIL) 40 MG tablet Take 1 tablet (40 mg total) by mouth daily. 90 tablet 3  . metFORMIN (GLUCOPHAGE) 500 MG tablet TAKE 2 TABLET(S) BY MOUTH TWICE DAILY WITH A MEAL 360 tablet 2  . metoprolol (LOPRESSOR) 100 MG tablet Take 1 tablet (100 mg total) by mouth 2 (two) times daily. 180 tablet 3  . omeprazole (PRILOSEC) 40 MG capsule Take 1 capsule (40 mg total) by mouth daily. Take at dinnertime. 90 capsule 3  . testosterone cypionate (DEPOTESTOSTERONE CYPIONATE) 200 MG/ML injection Inject 1 mL (200 mg total) into the muscle every 21 ( twenty-one) days. 10 mL 0  . traMADol (ULTRAM) 50 MG tablet Take 1 tablet (50 mg total) by mouth every 8 (eight) hours as needed. 60 tablet 0   No current facility-administered medications for this visit.  No Known Allergies   Exam:  BP 135/75 mmHg  Pulse 66  Wt 228 lb (103.42 kg) Gen: WDWN male in no acute distress. HEENT: EOMI,  MMM Lungs: Normal work of breathing. CTABL Heart: RRR no MRG Abd: NABS, Soft. Nondistended, Nontender Exts: Brisk capillary refill, warm and well perfused. First digit of right foot appears moderately swollen throughout with a large fluid filled blister over the dorsal interphalangeal joint. There is a hematoma which extends several centimeters distally and proximally from the interphalangeal joint. ROM intact but limited due to swelling.     CLINICAL DATA: Fall last night, jammed great toe. Redness, pain, swelling.  EXAM: RIGHT FOOT COMPLETE - 3+ VIEW  COMPARISON: None.  FINDINGS: No acute  bony abnormality. Specifically, no fracture, subluxation, or dislocation. Soft tissues are intact.  IMPRESSION: No acute bony abnormality.   Electronically Signed  By: Rolm Baptise M.D.  On: 02/20/2015 09:29    Patient received his routine nurse visit testosterone injection today.   Assessment: 1.  Contusion of toe with blood blister based on patient history and physical exam findings. Doubtful for fracture based on radiographic evidence.   Plan: 1. Patient declined hard soled post-OP shoe today. Patient instructed to take tylenol OTC PRN for pain relief. Discussed risks and benefits of draining blood blister today with patient. Patient conveyed understanding. Please see discharge instructions.

## 2015-03-06 ENCOUNTER — Ambulatory Visit (INDEPENDENT_AMBULATORY_CARE_PROVIDER_SITE_OTHER): Payer: Medicare Other | Admitting: Family Medicine

## 2015-03-06 VITALS — BP 114/78 | HR 66 | Temp 98.0°F | Wt 230.0 lb

## 2015-03-06 DIAGNOSIS — E291 Testicular hypofunction: Secondary | ICD-10-CM

## 2015-03-06 NOTE — Progress Notes (Signed)
Patient ID: Benjamin Powell, male   DOB: [birth year], 69 y.o.   MRN: [id number]   Patient here today for T- shot injection. Tolerated well in the RUOQ. No complaint's of shortness of breath or chest pain. Patient is making appointment for  Next injection x 2 weeks.

## 2015-03-20 ENCOUNTER — Telehealth: Payer: Self-pay | Admitting: Family Medicine

## 2015-03-20 ENCOUNTER — Ambulatory Visit (INDEPENDENT_AMBULATORY_CARE_PROVIDER_SITE_OTHER): Payer: Medicare Other | Admitting: Family Medicine

## 2015-03-20 VITALS — BP 140/80 | HR 60 | Ht 72.0 in | Wt 234.0 lb

## 2015-03-20 DIAGNOSIS — E291 Testicular hypofunction: Secondary | ICD-10-CM

## 2015-03-20 MED ORDER — TESTOSTERONE CYPIONATE 200 MG/ML IM SOLN
200.0000 mg | INTRAMUSCULAR | Status: DC
  Administered 2015-03-20: 200 mg via INTRAMUSCULAR

## 2015-03-20 MED ORDER — TRIAMCINOLONE ACETONIDE 0.1 % MT PSTE: PASTE | OROMUCOSAL | Status: DC

## 2015-03-20 NOTE — Progress Notes (Signed)
Patient was given 200 mg of  DepoTestosterone in [place]. Patient denied any headaches, shortness of breath, dizziness or anything abnormal. Patient scheduled 2 week follow up for next injection. Adair Lauderback,CMA

## 2015-03-20 NOTE — Telephone Encounter (Signed)
Pt called. The corners of his mouth are cracked and he would like soemthing for it.  Thank you.

## 2015-03-20 NOTE — Telephone Encounter (Signed)
Pt.notified

## 2015-03-20 NOTE — Telephone Encounter (Signed)
triamcinalone sent to his Cytogeneticist

## 2015-04-06 ENCOUNTER — Ambulatory Visit: Payer: Medicare Other

## 2015-04-07 ENCOUNTER — Other Ambulatory Visit: Payer: Self-pay | Admitting: Family Medicine

## 2015-04-10 ENCOUNTER — Ambulatory Visit (INDEPENDENT_AMBULATORY_CARE_PROVIDER_SITE_OTHER): Payer: Medicare Other | Admitting: Family Medicine

## 2015-04-10 ENCOUNTER — Encounter: Payer: Self-pay | Admitting: Family Medicine

## 2015-04-10 ENCOUNTER — Ambulatory Visit: Payer: Medicare Other | Admitting: Family Medicine

## 2015-04-10 VITALS — BP 116/66 | HR 57 | Ht 72.0 in | Wt 229.0 lb

## 2015-04-10 DIAGNOSIS — E291 Testicular hypofunction: Secondary | ICD-10-CM | POA: Diagnosis not present

## 2015-04-10 MED ORDER — TESTOSTERONE CYPIONATE 200 MG/ML IM SOLN
200.0000 mg | INTRAMUSCULAR | Status: DC
  Administered 2015-04-10: 200 mg via INTRAMUSCULAR

## 2015-04-10 MED ORDER — TRAMADOL HCL 50 MG PO TABS: 50.0000 mg | ORAL_TABLET | Freq: Three times a day (TID) | ORAL | Status: DC | PRN

## 2015-04-10 NOTE — Progress Notes (Signed)
Patient was in office for Testosterone injection 200 ml was given LUOQ. Patient denied any headaches, shortness of breath, dizziness, or mood changes. Patient scheduled 2 week appt for next injection. Jaymian Bogart,CMA

## 2015-04-10 NOTE — Progress Notes (Signed)
Scheduling error

## 2015-04-24 ENCOUNTER — Ambulatory Visit (INDEPENDENT_AMBULATORY_CARE_PROVIDER_SITE_OTHER): Payer: Medicare Other | Admitting: Family Medicine

## 2015-04-24 VITALS — BP 139/76 | HR 60 | Wt 234.0 lb

## 2015-04-24 DIAGNOSIS — E291 Testicular hypofunction: Secondary | ICD-10-CM | POA: Diagnosis not present

## 2015-04-24 MED ORDER — TESTOSTERONE CYPIONATE 200 MG/ML IM SOLN
200.0000 mg | Freq: Once | INTRAMUSCULAR | Status: AC
  Administered 2015-04-24: 200 mg via INTRAMUSCULAR

## 2015-04-24 NOTE — Progress Notes (Signed)
   Subjective:    Patient ID: Benjamin Powell, male    DOB: 11/28/1945, 69 y.o.   MRN: [id number]  HPI  Benjamin Powell is here for a testosterone injection. Denies chest pain, shortness of breath, headaches or mood changes.    Review of Systems     Objective:   Physical Exam        Assessment & Plan:  Patient tolerated injection well without complications. Patient advised to schedule next injection 21 days from today.

## 2015-04-25 ENCOUNTER — Other Ambulatory Visit: Payer: Self-pay | Admitting: Family Medicine

## 2015-05-08 ENCOUNTER — Encounter: Payer: Self-pay | Admitting: Family Medicine

## 2015-05-08 ENCOUNTER — Ambulatory Visit (INDEPENDENT_AMBULATORY_CARE_PROVIDER_SITE_OTHER): Payer: Medicare Other | Admitting: Family Medicine

## 2015-05-08 ENCOUNTER — Ambulatory Visit: Payer: Medicare Other

## 2015-05-08 VITALS — BP 146/90 | HR 58 | Wt 235.0 lb

## 2015-05-08 DIAGNOSIS — E291 Testicular hypofunction: Secondary | ICD-10-CM

## 2015-05-08 DIAGNOSIS — F333 Major depressive disorder, recurrent, severe with psychotic symptoms: Secondary | ICD-10-CM | POA: Diagnosis not present

## 2015-05-08 DIAGNOSIS — R5383 Other fatigue: Secondary | ICD-10-CM | POA: Diagnosis not present

## 2015-05-08 LAB — CBC
HCT: 49.9 % (ref 39.0–52.0)
HEMOGLOBIN: 17.7 g/dL — AB (ref 13.0–17.0)
MCH: 31.8 pg (ref 26.0–34.0)
MCHC: 35.5 g/dL (ref 30.0–36.0)
MCV: 89.7 fL (ref 78.0–100.0)
MPV: 11.2 fL (ref 8.6–12.4)
Platelets: 133 10*3/uL — ABNORMAL LOW (ref 150–400)
RBC: 5.56 MIL/uL (ref 4.22–5.81)
RDW: 13.5 % (ref 11.5–15.5)
WBC: 7.8 10*3/uL (ref 4.0–10.5)

## 2015-05-08 LAB — TSH: TSH: 2.876 u[IU]/mL (ref 0.350–4.500)

## 2015-05-08 LAB — FOLATE: Folate: 14.8 ng/mL

## 2015-05-08 LAB — VITAMIN B12: VITAMIN B 12: 285 pg/mL (ref 211–911)

## 2015-05-08 MED ORDER — TESTOSTERONE CYPIONATE 200 MG/ML IM SOLN
200.0000 mg | Freq: Once | INTRAMUSCULAR | Status: AC
  Administered 2015-05-08: 200 mg via INTRAMUSCULAR

## 2015-05-08 MED ORDER — VENLAFAXINE HCL ER 75 MG PO CP24: 75.0000 mg | ORAL_CAPSULE | Freq: Every day | ORAL | Status: DC

## 2015-05-08 NOTE — Assessment & Plan Note (Signed)
Severe depression symptoms with some psychotic features. I'm not sure if this is simply due to depression or patient has a more complex neurologic condition such as frontotemporal dementia. Plan for follow-up with PCP ASAP. We'll obtain dementia lab panel. Additionally we stop Celexa and start Effexor. Return next week. Verbal contract for safety.

## 2015-05-08 NOTE — Progress Notes (Signed)
Benjamin Powell is a 69 y.o. male who presents to [facility] today for baseline psychiatric change. Over the past few years patient has had worsening depression symptoms. He feels down and depressed and hopeless. However he's had worsening symptoms recently. He notes he is becoming more irritable and lashing out. He notes she's been having visual and auditory hallucinations. His family expressed that at times he is delusional. He additionally has mental fogginess and decreased thinking ability. His family is worried he has dementia. Patient perceives significant difficulty with his personal life due to the symptoms. He notes a passive death wish but denies any active homicidality or suicidality. He takes Celexa 20 mg daily.   Past Medical History  Diagnosis Date  . Hypertension   . Diabetes mellitus   . Allergy     seasonal  . GERD (gastroesophageal reflux disease)   . Anxiety   . Sleep apnea    Past Surgical History  Procedure Laterality Date  . Ankle surgery  1993    left  . Tonsillectomy and adenoidectomy  1998    and sinus for sleep apnea  . Cholecystectomy  1990  . Ercp  1990   Social History  Substance Use Topics  . Smoking status: Current Every Day Smoker -- 0.30 packs/day for 50 years    Types: Cigarettes  . Smokeless tobacco: Never Used  . Alcohol Use: No   family history includes Colitis in his brother and sister; Pancreatic cancer in his mother; Stroke in his brother.  ROS as above Medications: Current Outpatient Prescriptions  Medication Sig Dispense Refill  . amLODipine (NORVASC) 10 MG tablet Take 1 tablet (10 mg total) by mouth daily. 90 tablet 3  . aspirin 81 MG tablet Take 81 mg by mouth daily.    . cloNIDine (CATAPRES) 0.3 MG tablet Take 1 tablet (0.3 mg total) by mouth 2 (two) times daily. 180 tablet 3  . glipiZIDE (GLIPIZIDE XL) 5 MG 24 hr tablet  Take 1 tablet (5 mg total) by mouth daily with breakfast. APPOINTMENT NEEDED FOR FURTHER REFILLS 30 tablet 0  . hydrochlorothiazide (HYDRODIURIL) 25 MG tablet TAKE 1 TABLET (25 MG TOTAL) BY MOUTH DAILY. PATIENT NEEDS TO [place] FOLLOW UP APPOINTMENT BEFORE MORE REFILLS. 90 tablet 0  . HYDROcodone-acetaminophen (NORCO) 10-325 MG tablet Take 1 tablet by mouth at bedtime as needed. 30 tablet 0  . lisinopril (PRINIVIL,ZESTRIL) 40 MG tablet Take 1 tablet (40 mg total) by mouth daily. 90 tablet 3  . metFORMIN (GLUCOPHAGE) 500 MG tablet TAKE 2 TABLET(S) BY MOUTH TWICE DAILY WITH A MEAL 360 tablet 2  . metoprolol (LOPRESSOR) 100 MG tablet Take 1 tablet (100 mg total) by mouth 2 (two) times daily. 180 tablet 3  . omeprazole (PRILOSEC) 40 MG capsule Take 1 capsule (40 mg total) by mouth daily. Take at dinnertime. 90 capsule 3  . testosterone cypionate (DEPOTESTOSTERONE CYPIONATE) 200 MG/ML injection Inject 1 mL (200 mg total) into the muscle every 21 ( twenty-one) days. 10 mL 0  . traMADol (ULTRAM) 50 MG tablet Take 1 tablet (50 mg total) by mouth every 8 (eight) hours as needed. 60 tablet 0  . triamcinolone (KENALOG) 0.1 % paste Apply to cracked lips up to twice a day as needed. 5 g 1  . venlafaxine XR (EFFEXOR-XR) 75 MG 24 hr capsule Take 1 capsule (75 mg total) by mouth daily with breakfast. 30 capsule 0   No current facility-administered medications for  this visit.   No Known Allergies   Exam:  BP 146/90 mmHg  Pulse 58  Wt 235 lb (106.595 kg) Gen: Well NAD nontoxic appearing HEENT: EOMI,  MMM Lungs: Normal work of breathing. CTABL Heart: RRR no MRG Abd: NABS, Soft. Nondistended, Nontender Exts: Brisk capillary refill, warm and well perfused.  Psych: Alert and oriented normal affect and speech and thought process. Passive death wish expressed but no SI or HI. Patient notes visual hallucinations described as people outside of the house. He does not appear to be responding to any auditory  hallucinations in the room today. There are no delusions expressed.   PHQ-9 :  25   No results found for this or any previous visit (from the past 24 hour(s)). No results found.   Please see individual assessment and plan sections.

## 2015-05-08 NOTE — Patient Instructions (Signed)
Thank you for coming in today. STOP celexa.  Start effexor tomorrow.  Return to Dr Lemmie Evens or myself tomorrow.   Major Depressive Disorder Major depressive disorder is a mental illness. It also may be called clinical depression or unipolar depression. Major depressive disorder usually causes feelings of sadness, hopelessness, or helplessness. Some people with this disorder do not feel particularly sad but lose interest in doing things they used to enjoy (anhedonia). Major depressive disorder also can cause physical symptoms. It can interfere with work, school, relationships, and other normal everyday activities. The disorder varies in severity but is longer lasting and more serious than the sadness we all feel from time to time in our lives. Major depressive disorder often is triggered by stressful life events or major life changes. Examples of these triggers include divorce, loss of your job or home, a move, and the death of a family member or close friend. Sometimes this disorder occurs for no obvious reason at all. People who have family members with major depressive disorder or bipolar disorder are at higher risk for developing this disorder, with or without life stressors. Major depressive disorder can occur at any age. It may occur just once in your life (single episode major depressive disorder). It may occur multiple times (recurrent major depressive disorder). SYMPTOMS People with major depressive disorder have either anhedonia or depressed mood on nearly a daily basis for at least 2 weeks or longer. Symptoms of depressed mood include:  Feelings of sadness (blue or down in the dumps) or emptiness.  Feelings of hopelessness or helplessness.  Tearfulness or episodes of crying (may be observed by others).  Irritability (children and adolescents). In addition to depressed mood or anhedonia or both, people with this disorder have at least four of the following symptoms:  Difficulty sleeping or  sleeping too much.   Significant change (increase or decrease) in appetite or weight.   Lack of energy or motivation.  Feelings of guilt and worthlessness.   Difficulty concentrating, remembering, or making decisions.  Unusually slow movement (psychomotor retardation) or restlessness (as observed by others).   Recurrent wishes for death, recurrent thoughts of self-harm (suicide), or a suicide attempt. People with major depressive disorder commonly have persistent negative thoughts about themselves, other people, and the world. People with severe major depressive disorder may experiencedistorted beliefs or perceptions about the world (psychotic delusions). They also may see or hear things that are not real (psychotic hallucinations). DIAGNOSIS Major depressive disorder is diagnosed through an assessment by your health care provider. Your health care provider will ask aboutaspects of your daily life, such as mood,sleep, and appetite, to see if you have the diagnostic symptoms of major depressive disorder. Your health care provider may ask about your medical history and use of alcohol or drugs, including prescription medicines. Your health care provider also may do a physical exam and blood work. This is because certain medical conditions and the use of certain substances can cause major depressive disorder-like symptoms (secondary depression). Your health care provider also may refer you to a mental health specialist for further evaluation and treatment. TREATMENT It is important to recognize the symptoms of major depressive disorder and seek treatment. The following treatments can be prescribed for this disorder:   Medicine. Antidepressant medicines usually are prescribed. Antidepressant medicines are thought to correct chemical imbalances in the brain that are commonly associated with major depressive disorder. Other types of medicine may be added if the symptoms do not respond to  antidepressant medicines alone  or if psychotic delusions or hallucinations occur.  Talk therapy. Talk therapy can be helpful in treating major depressive disorder by providing support, education, and guidance. Certain types of talk therapy also can help with negative thinking (cognitive behavioral therapy) and with relationship issues that trigger this disorder (interpersonal therapy). A mental health specialist can help determine which treatment is best for you. Most people with major depressive disorder do well with a combination of medicine and talk therapy. Treatments involving electrical stimulation of the brain can be used in situations with extremely severe symptoms or when medicine and talk therapy do not work over time. These treatments include electroconvulsive therapy, transcranial magnetic stimulation, and vagal nerve stimulation.   This information is not intended to replace advice given to you by your health care provider. Make sure you discuss any questions you have with your health care provider.   Document Released: 08/20/2012 Document Revised: 05/16/2014 Document Reviewed: 08/20/2012 Elsevier Interactive Patient Education [employer].

## 2015-05-08 NOTE — Addendum Note (Signed)
Addended by: Darla Lesches T on: 05/08/2015 01:24 PM   Modules accepted: Orders

## 2015-05-09 LAB — COMPREHENSIVE METABOLIC PANEL
ALK PHOS: 47 U/L (ref 40–115)
ALT: 23 U/L (ref 9–46)
AST: 15 U/L (ref 10–35)
Albumin: 4.1 g/dL (ref 3.6–5.1)
BILIRUBIN TOTAL: 0.7 mg/dL (ref 0.2–1.2)
BUN: 17 mg/dL (ref 7–25)
CO2: 27 mmol/L (ref 20–31)
CREATININE: 1.2 mg/dL (ref 0.70–1.25)
Calcium: 9.3 mg/dL (ref 8.6–10.3)
Chloride: 100 mmol/L (ref 98–110)
Glucose, Bld: 147 mg/dL — ABNORMAL HIGH (ref 65–99)
POTASSIUM: 3.6 mmol/L (ref 3.5–5.3)
SODIUM: 138 mmol/L (ref 135–146)
TOTAL PROTEIN: 6.8 g/dL (ref 6.1–8.1)

## 2015-05-09 LAB — RPR

## 2015-05-09 LAB — HIV ANTIBODY (ROUTINE TESTING W REFLEX): HIV 1&2 Ab, 4th Generation: NONREACTIVE

## 2015-05-13 NOTE — Progress Notes (Signed)
Quick Note:  Normal, no changes. ______ 

## 2015-05-15 ENCOUNTER — Encounter: Payer: Self-pay | Admitting: Family Medicine

## 2015-05-15 ENCOUNTER — Ambulatory Visit (INDEPENDENT_AMBULATORY_CARE_PROVIDER_SITE_OTHER): Payer: Self-pay | Admitting: Family Medicine

## 2015-05-15 VITALS — BP 141/87 | HR 60 | Wt 235.0 lb

## 2015-05-15 DIAGNOSIS — G478 Other sleep disorders: Secondary | ICD-10-CM

## 2015-05-15 DIAGNOSIS — G4733 Obstructive sleep apnea (adult) (pediatric): Secondary | ICD-10-CM

## 2015-05-15 DIAGNOSIS — F323 Major depressive disorder, single episode, severe with psychotic features: Secondary | ICD-10-CM

## 2015-05-15 MED ORDER — HYDROCHLOROTHIAZIDE 25 MG PO TABS: ORAL_TABLET | ORAL | Status: DC

## 2015-05-15 MED ORDER — GLIPIZIDE ER 5 MG PO TB24: 5.0000 mg | ORAL_TABLET | Freq: Every day | ORAL | Status: DC

## 2015-05-15 MED ORDER — VENLAFAXINE HCL ER 150 MG PO CP24: 150.0000 mg | ORAL_CAPSULE | Freq: Every day | ORAL | Status: DC

## 2015-05-15 NOTE — Progress Notes (Signed)
CC: Benjamin Powell is a 70 y.o. male is here for Follow-up   Subjective: HPI:   Follow-up depression: Since stopping Celexa and starting on Effexor he has not noticed any new side effects. He is no longer having hallucinations that he described last week ( little men in his hands). He still feels somewhat depressed and has lost interest in activities. He still doesn't want to leave the house and still has a short temper towards the family members however all of these issues have mildly improved.    Wife is concerned about forgetfulness. For example he'll forget about conversations they had earlier in the day.he seems somewhat asleep all hours of the daybut constantly feels like he is tired. He has a history of sleep apnea however he was never able to follow up on a repeat attempt for CPAP titration.  He denies any fevers, chills, cough, shortness of breath, vision loss or anxiety.   Review Of Systems Outlined In HPI  Past Medical History  Diagnosis Date  . Hypertension   . Diabetes mellitus   . Allergy     seasonal  . GERD (gastroesophageal reflux disease)   . Anxiety   . Sleep apnea     Past Surgical History  Procedure Laterality Date  . Ankle surgery  1993    left  . Tonsillectomy and adenoidectomy  1998    and sinus for sleep apnea  . Cholecystectomy  1990  . Ercp  1990   Family History  Problem Relation Age of Onset  . Stroke Brother   . Colitis Brother   . Pancreatic cancer Mother   . Colitis Sister     Social History   Social History  . Marital Status: Married    Spouse Name: N/A  . Number of Children: N/A  . Years of Education: N/A   Occupational History  . Not on file.   Social History Main Topics  . Smoking status: Current Every Day Smoker -- 0.30 packs/day for 50 years    Types: Cigarettes  . Smokeless tobacco: Never Used  . Alcohol Use: No  . Drug Use: No  . Sexual Activity: Not on file   Other Topics Concern  . Not on file   Social History  Narrative     Objective: BP 141/87 mmHg  Pulse 60  Wt 235 lb (106.595 kg)  Vital signs reviewed. General: Alert and Oriented, No Acute Distress HEENT: Pupils equal, round, reactive to light. Conjunctivae clear.  External ears unremarkable.  Moist mucous membranes. Lungs: Clear and comfortable work of breathing, speaking in full sentences without accessory muscle use. Cardiac: Regular rate and rhythm.  Neuro: CN II-XII grossly intact, gait normal. Extremities: No peripheral edema.  Strong peripheral pulses.  Mental Status: No depression, anxiety, nor agitation. Logical though process. Skin: Warm and dry  MMSE 30/30  Assessment & Plan: Benjamin Powell was seen today for follow-up.  Diagnoses and all orders for this visit:  Non-restorative sleep -     Cpap titration  Obstructive sleep apnea -     Cpap titration  Depression, psychotic ([place])  Other orders -     glipiZIDE (GLIPIZIDE XL) 5 MG 24 hr tablet; Take 1 tablet (5 mg total) by mouth daily with breakfast. -     hydrochlorothiazide (HYDRODIURIL) 25 MG tablet; TAKE 1 TABLET (25 MG TOTAL) BY MOUTH DAILY. -     venlafaxine XR (EFFEXOR XR) 150 MG 24 hr capsule; Take 1 capsule (150 mg total) by mouth daily  with breakfast.   Depression seems to be improving but still uncontrolled, increasing Effexor 250 mg. Psychotic features are now resolved.  I feel like his inadequately treated objective sleep apnea is contributed to some of his depression and irritability. I will try to arrange a CPAP titration in the near future for him and encouraged him to take a Benadryl before going to bed with this test since there was issues with staying asleep long enough for a former titration attempt.  25 minutes spent face-to-face during visit today of which at least 50% was counseling or coordinating care regarding: 1. Non-restorative sleep   2. Obstructive sleep apnea   3. Depression, psychotic ([place])      Return in about 4 weeks (around  06/12/2015).

## 2015-05-19 ENCOUNTER — Telehealth: Payer: Self-pay

## 2015-05-19 DIAGNOSIS — G4733 Obstructive sleep apnea (adult) (pediatric): Secondary | ICD-10-CM

## 2015-05-19 NOTE — Telephone Encounter (Signed)
[facility] called wanting a new order for a sleep study.

## 2015-05-20 NOTE — Telephone Encounter (Signed)
The split test will be fine

## 2015-05-20 NOTE — Telephone Encounter (Signed)
Can you please ask them if the "split" sleep study that I've put in your In Box is ok?

## 2015-05-22 ENCOUNTER — Ambulatory Visit (HOSPITAL_BASED_OUTPATIENT_CLINIC_OR_DEPARTMENT_OTHER): Payer: Commercial Managed Care - HMO | Attending: Family Medicine

## 2015-05-22 ENCOUNTER — Ambulatory Visit (INDEPENDENT_AMBULATORY_CARE_PROVIDER_SITE_OTHER): Payer: Commercial Managed Care - HMO | Admitting: Family Medicine

## 2015-05-22 VITALS — BP 153/95 | HR 60 | Ht 72.0 in | Wt 235.0 lb

## 2015-05-22 VITALS — Ht 72.0 in | Wt 235.0 lb

## 2015-05-22 DIAGNOSIS — E291 Testicular hypofunction: Secondary | ICD-10-CM | POA: Diagnosis not present

## 2015-05-22 DIAGNOSIS — E669 Obesity, unspecified: Secondary | ICD-10-CM | POA: Insufficient documentation

## 2015-05-22 DIAGNOSIS — R0683 Snoring: Secondary | ICD-10-CM | POA: Diagnosis not present

## 2015-05-22 DIAGNOSIS — R5383 Other fatigue: Secondary | ICD-10-CM | POA: Insufficient documentation

## 2015-05-22 DIAGNOSIS — G4733 Obstructive sleep apnea (adult) (pediatric): Secondary | ICD-10-CM

## 2015-05-22 DIAGNOSIS — Z6834 Body mass index (BMI) 34.0-34.9, adult: Secondary | ICD-10-CM | POA: Insufficient documentation

## 2015-05-22 DIAGNOSIS — G4761 Periodic limb movement disorder: Secondary | ICD-10-CM | POA: Diagnosis not present

## 2015-05-22 MED ORDER — TESTOSTERONE CYPIONATE 200 MG/ML IM SOLN
200.0000 mg | INTRAMUSCULAR | Status: DC
  Administered 2015-05-22: 200 mg via INTRAMUSCULAR

## 2015-05-22 NOTE — Progress Notes (Signed)
   Subjective:    Patient ID: Benjamin Powell, male    DOB: 05-12-1945, 70 y.o.   MRN: [id number]  HPI  Patient comes in for a testosterone injection. Denies chest pain, shortness of breath, headaches or any other problems.  Review of Systems     Objective:   Physical Exam   BP 153/95 mmHg  Pulse 60  Ht 6' (1.829 m)  Wt 235 lb (106.595 kg)  BMI 31.86 kg/m2      Assessment & Plan:   Injected 52ml testosterone on left upper outer quadrant, patient tolerated well without complications.  Patient made his followup appointment in 2 weeks.

## 2015-05-24 DIAGNOSIS — G4733 Obstructive sleep apnea (adult) (pediatric): Secondary | ICD-10-CM

## 2015-05-24 NOTE — Progress Notes (Signed)
Patient Name: Leonce, Bale Date: 05/22/2015 Gender: Male D.O.B: [birth year] Age (years): 69 Referring Provider: Marcial Pacas Height (inches): 47 Interpreting Physician: Baird Lyons MD, ABSM Weight (lbs): 235 RPSGT: Baxter Flattery BMI: 34 MRN: [id number] Neck Size: 17.00 CLINICAL INFORMATION Sleep Study Type: Split Night CPAP Indication for sleep study: Fatigue, Obesity, Snoring, Witnessed Apneas Epworth Sleepiness Score: 10  SLEEP STUDY TECHNIQUE As per the AASM Manual for the Scoring of Sleep and Associated Events v2.3 (April 2016) with a hypopnea requiring 4% desaturations. The channels recorded and monitored were frontal, central and occipital EEG, electrooculogram (EOG), submentalis EMG (chin), nasal and oral airflow, thoracic and abdominal wall motion, anterior tibialis EMG, snore microphone, electrocardiogram, and pulse oximetry. Continuous positive airway pressure (CPAP) was initiated when the patient met split night criteria and was titrated according to treat sleep-disordered breathing.  MEDICATIONS Medications taken by the patient : charted for review Medications administered by patient during sleep study : No sleep medicine administered.  RESPIRATORY PARAMETERS Diagnostic Total AHI (/hr): 25.7 RDI (/hr): 25.7 OA Index (/hr): 10.9 CA Index (/hr): 1.0 REM AHI (/hr): N/A NREM AHI (/hr): 25.7 Supine AHI (/hr): 25.7 Non-supine AHI (/hr): N/A Min O2 Sat (%): 84.00 Mean O2 (%): 91.26 Time below 88% (min): 4.8   Titration Optimal Pressure (cm): 11 AHI at Optimal Pressure (/hr): 0.0 Min O2 at Optimal Pressure (%): 89.0 Supine % at Optimal (%): 0 Sleep % at Optimal (%): 100    SLEEP ARCHITECTURE The recording time for the entire night was 413.0 minutes. During a baseline period of 195.7 minutes, the patient slept for 181.9 minutes in REM and nonREM, yielding a sleep efficiency of 92.9%. Sleep onset after lights out was 0.8 minutes with a REM latency of N/A minutes.  The patient spent 12.64% of the night in stage N1 sleep, 87.36% in stage N2 sleep, 0.00% in stage N3 and 0.00% in REM. During the titration period of 211.6 minutes, the patient slept for 206.2 minutes in REM and nonREM, yielding a sleep efficiency of 97.5%. Sleep onset after CPAP initiation was 3.9 minutes with a REM latency of 67.0 minutes. The patient spent 4.61% of the night in stage N1 sleep, 65.22% in stage N2 sleep, 0.00% in stage N3 and 30.17% in REM.  CARDIAC DATA The 2 lead EKG demonstrated sinus rhythm. The mean heart rate was 58.65 beats per minute. Other EKG findings include: None.  LEG MOVEMENT DATA The total Periodic Limb Movements of Sleep (PLMS) were 484. The PLMS index was 74.80 .  IMPRESSIONS - Moderate obstructive sleep apnea occurred during the diagnostic portion of the study(AHI = 25.7/hour). An optimal PAP pressure was selected for this patient ( 11 cm of water) - No significant central sleep apnea occurred during the diagnostic portion of the study (CAI = 1.0/hour). - Severe oxygen desaturation was noted during the diagnostic portion of the study (Min O2 = 84.00%). - No snoring was audible during the diagnostic portion of the study. - No cardiac abnormalities were noted during this study. - Severe periodic limb movements of sleep occurred during the diagnostic phase, reflecting respiratory arousals.  DIAGNOSIS - Obstructive Sleep Apnea (327.23 [G47.33 ICD-10])  RECOMMENDATIONS - Trial of CPAP therapy on 11 cm H2O with a Large size Fisher&Paykel Nasal Mask Eson mask and heated humidification. - Avoid alcohol, sedatives and other CNS depressants that may worsen sleep apnea and disrupt normal sleep architecture. - Sleep hygiene should be reviewed to assess factors that may improve sleep quality. - Weight management and  regular exercise should be initiated or continued.  [name] Diplomate, American Board of Sleep Medicine  ELECTRONICALLY SIGNED ON:  05/24/2015,  4:27 PM [place] PH: [phone]   FX: [phone] [place]

## 2015-05-25 ENCOUNTER — Telehealth: Payer: Self-pay | Admitting: Family Medicine

## 2015-05-25 DIAGNOSIS — G4733 Obstructive sleep apnea (adult) (pediatric): Secondary | ICD-10-CM

## 2015-05-25 DIAGNOSIS — Z9989 Dependence on other enabling machines and devices: Principal | ICD-10-CM

## 2015-05-25 MED ORDER — AMBULATORY NON FORMULARY MEDICATION
Status: DC
Start: 2015-05-25 — End: 2015-06-19

## 2015-05-25 NOTE — Telephone Encounter (Signed)
Will you please let patient know that his CPAP titration was successful.  I'm going to print off a Rx for this, if he does not already have a respiratory supply company he's worked with [place] recommend going through [facility]. Lab slip in your in box

## 2015-05-29 NOTE — Telephone Encounter (Signed)
Rx faxed

## 2015-06-05 ENCOUNTER — Ambulatory Visit (INDEPENDENT_AMBULATORY_CARE_PROVIDER_SITE_OTHER): Payer: Commercial Managed Care - HMO | Admitting: Family Medicine

## 2015-06-05 VITALS — BP 142/77 | HR 64 | Wt 232.0 lb

## 2015-06-05 DIAGNOSIS — E291 Testicular hypofunction: Secondary | ICD-10-CM | POA: Diagnosis not present

## 2015-06-05 MED ORDER — TESTOSTERONE CYPIONATE 200 MG/ML IM SOLN
200.0000 mg | INTRAMUSCULAR | Status: DC
  Administered 2015-06-05: 200 mg via INTRAMUSCULAR

## 2015-06-05 NOTE — Progress Notes (Signed)
Patient was in office for Testosterone injection. 200 ml was given RUOQ. Patient denied any headaches, shortness of breath or dizziness. Ryin Ambrosius,CMA

## 2015-06-08 ENCOUNTER — Telehealth: Payer: Self-pay | Admitting: Family Medicine

## 2015-06-08 NOTE — Telephone Encounter (Signed)
Pt had sleep study through [facility] and he has still not heard anything from pcp for the results. Please call patient asap. Thanks, [employer]

## 2015-06-08 NOTE — Telephone Encounter (Signed)
Benjamin Powell, Can you please contact the patient and clear things up, please refer to the 05/25/15 phone note if needed.

## 2015-06-08 NOTE — Telephone Encounter (Signed)
Pt.notified

## 2015-06-11 ENCOUNTER — Other Ambulatory Visit: Payer: Self-pay

## 2015-06-11 DIAGNOSIS — I10 Essential (primary) hypertension: Secondary | ICD-10-CM

## 2015-06-11 MED ORDER — METOPROLOL TARTRATE 100 MG PO TABS: 100.0000 mg | ORAL_TABLET | Freq: Two times a day (BID) | ORAL | Status: DC

## 2015-06-11 MED ORDER — AMLODIPINE BESYLATE 10 MG PO TABS: 10.0000 mg | ORAL_TABLET | Freq: Every day | ORAL | Status: DC

## 2015-06-11 MED ORDER — HYDROCHLOROTHIAZIDE 25 MG PO TABS: ORAL_TABLET | ORAL | Status: DC

## 2015-06-11 MED ORDER — GLIPIZIDE ER 5 MG PO TB24: 5.0000 mg | ORAL_TABLET | Freq: Every day | ORAL | Status: DC

## 2015-06-11 MED ORDER — CLONIDINE HCL 0.3 MG PO TABS
0.3000 mg | ORAL_TABLET | Freq: Two times a day (BID) | ORAL | Status: DC
Start: 2015-06-11 — End: 2015-06-15

## 2015-06-11 MED ORDER — VENLAFAXINE HCL ER 150 MG PO CP24: 150.0000 mg | ORAL_CAPSULE | Freq: Every day | ORAL | Status: DC

## 2015-06-11 MED ORDER — LISINOPRIL 40 MG PO TABS: 40.0000 mg | ORAL_TABLET | Freq: Every day | ORAL | Status: DC

## 2015-06-11 MED ORDER — METFORMIN HCL 500 MG PO TABS: ORAL_TABLET | ORAL | Status: DC

## 2015-06-12 ENCOUNTER — Other Ambulatory Visit: Payer: Self-pay

## 2015-06-12 ENCOUNTER — Ambulatory Visit: Payer: Medicare Other | Admitting: Family Medicine

## 2015-06-12 DIAGNOSIS — I10 Essential (primary) hypertension: Secondary | ICD-10-CM

## 2015-06-12 MED ORDER — METFORMIN HCL 500 MG PO TABS: ORAL_TABLET | ORAL | Status: DC

## 2015-06-12 MED ORDER — METOPROLOL TARTRATE 100 MG PO TABS: 100.0000 mg | ORAL_TABLET | Freq: Two times a day (BID) | ORAL | Status: DC

## 2015-06-12 MED ORDER — LISINOPRIL 40 MG PO TABS: 40.0000 mg | ORAL_TABLET | Freq: Every day | ORAL | Status: DC

## 2015-06-15 ENCOUNTER — Ambulatory Visit: Payer: Commercial Managed Care - HMO | Admitting: Family Medicine

## 2015-06-15 ENCOUNTER — Other Ambulatory Visit: Payer: Self-pay

## 2015-06-15 DIAGNOSIS — I10 Essential (primary) hypertension: Secondary | ICD-10-CM

## 2015-06-15 MED ORDER — CLONIDINE HCL 0.3 MG PO TABS
0.3000 mg | ORAL_TABLET | Freq: Two times a day (BID) | ORAL | Status: DC
Start: 2015-06-15 — End: 2015-11-30

## 2015-06-15 MED ORDER — HYDROCHLOROTHIAZIDE 25 MG PO TABS: ORAL_TABLET | ORAL | Status: DC

## 2015-06-15 MED ORDER — AMLODIPINE BESYLATE 10 MG PO TABS: 10.0000 mg | ORAL_TABLET | Freq: Every day | ORAL | Status: DC

## 2015-06-19 ENCOUNTER — Ambulatory Visit (INDEPENDENT_AMBULATORY_CARE_PROVIDER_SITE_OTHER): Payer: Commercial Managed Care - HMO | Admitting: Family Medicine

## 2015-06-19 ENCOUNTER — Telehealth: Payer: Self-pay | Admitting: Family Medicine

## 2015-06-19 ENCOUNTER — Encounter: Payer: Self-pay | Admitting: Family Medicine

## 2015-06-19 VITALS — BP 148/86 | HR 61 | Wt 230.0 lb

## 2015-06-19 DIAGNOSIS — Z9989 Dependence on other enabling machines and devices: Principal | ICD-10-CM

## 2015-06-19 DIAGNOSIS — F32A Depression, unspecified: Secondary | ICD-10-CM

## 2015-06-19 DIAGNOSIS — G4733 Obstructive sleep apnea (adult) (pediatric): Secondary | ICD-10-CM | POA: Diagnosis not present

## 2015-06-19 DIAGNOSIS — F329 Major depressive disorder, single episode, unspecified: Secondary | ICD-10-CM | POA: Diagnosis not present

## 2015-06-19 DIAGNOSIS — E291 Testicular hypofunction: Secondary | ICD-10-CM

## 2015-06-19 MED ORDER — TESTOSTERONE CYPIONATE 200 MG/ML IM SOLN
200.0000 mg | Freq: Once | INTRAMUSCULAR | Status: AC
  Administered 2015-06-19: 200 mg via INTRAMUSCULAR

## 2015-06-19 MED ORDER — AMBULATORY NON FORMULARY MEDICATION: Status: DC

## 2015-06-19 NOTE — Telephone Encounter (Signed)
[place], Patient clams that he has yet to hear from any respiratory supply company regarding CPAP supplies.  Can you please check with [facility] to see if they will take his insurance.  If not can you please check with any other local supply compnay.  Rx and sleep study in your in box.

## 2015-06-19 NOTE — Telephone Encounter (Signed)
Faxed to [facility]

## 2015-06-19 NOTE — Progress Notes (Signed)
CC: Benjamin Powell is a 70 y.o. male is here for Depression   Subjective: HPI:  Follow-up depression: Since I saw him last has been taking 150 mg of Effexor on a daily basis. Reports on a percent compliance. Denies no side effects. He states he is feeling much better since I saw him last. He has a much better outlook on every day and finds more pleasure and old hobbies. He denies any depression or moodiness. Even his wife has noticed that he is acting more normal. He denies any hallucinations or paranoia.  Follow-up OSA: Since I saw him last CPAP titration confirmed resolution of his OSA while using CPAP therapy. He has yet to hear from a respiratory supply company about acquiring a CPAP machine. He still reports nonrestorative sleep and his wife notices disruptive sleeping habits.  Follow-up hypogonadism: He is requesting testosterone today. He is due for a shot on Monday but has a dentist appointment that might conflict with getting the injection here. He still goes me that he believes it's helping with energy and libido. He denies any side effects from medication. Denies chest pain, shortness of breath orthopnea nor motor or sensory disturbances   Review Of Systems Outlined In HPI  Past Medical History  Diagnosis Date  . Hypertension   . Diabetes mellitus   . Allergy     seasonal  . GERD (gastroesophageal reflux disease)   . Anxiety   . Sleep apnea     Past Surgical History  Procedure Laterality Date  . Ankle surgery  1993    left  . Tonsillectomy and adenoidectomy  1998    and sinus for sleep apnea  . Cholecystectomy  1990  . Ercp  1990   Family History  Problem Relation Age of Onset  . Stroke Brother   . Colitis Brother   . Pancreatic cancer Mother   . Colitis Sister     Social History   Social History  . Marital Status: Married    Spouse Name: N/A  . Number of Children: N/A  . Years of Education: N/A   Occupational History  . Not on file.   Social History  Main Topics  . Smoking status: Current Every Day Smoker -- 0.30 packs/day for 50 years    Types: Cigarettes  . Smokeless tobacco: Never Used  . Alcohol Use: No  . Drug Use: No  . Sexual Activity: Not on file   Other Topics Concern  . Not on file   Social History Narrative     Objective: BP 148/86 mmHg  Pulse 61  Wt 230 lb (104.327 kg)  General: Alert and Oriented, No Acute Distress HEENT: Pupils equal, round, reactive to light. Conjunctivae clear.  Moist mucous membranes Lungs: Clear to auscultation bilaterally, no wheezing/ronchi/rales.  Comfortable work of breathing. Good air movement. Cardiac: Regular rate and rhythm. Normal S1/S2.  No murmurs, rubs, nor gallops.   Abdomen: Mild obesity Extremities: No peripheral edema.  Strong peripheral pulses.  Mental Status: No depression, anxiety, nor agitation. Skin: Warm and dry.  Assessment & Plan: Benjamin Powell was seen today for depression.  Diagnoses and all orders for this visit:  Depression  Obstructive sleep apnea  Hypogonadism in male   Depression: Improved and controlled with Effexor 150 mg daily, discussed possibility of tapering down over the summer if he is interested. Objective sleep apnea: Uncontrolled will make an effort today to try to get in touch with a variety of respiratory supply companies to get him his  CPAP machine as soon as possible Hypogonadism: Controlled with testosterone. Will receive injection today repeat in 3 weeks.  Return in about 3 months (around 09/16/2015) for Mood.

## 2015-06-19 NOTE — Addendum Note (Signed)
Addended by: Delrae Alfred on: 06/19/2015 11:45 AM   Modules accepted: Orders

## 2015-06-26 ENCOUNTER — Ambulatory Visit: Payer: Commercial Managed Care - HMO

## 2015-07-02 DIAGNOSIS — G4733 Obstructive sleep apnea (adult) (pediatric): Secondary | ICD-10-CM | POA: Diagnosis not present

## 2015-07-02 DIAGNOSIS — I159 Secondary hypertension, unspecified: Secondary | ICD-10-CM | POA: Diagnosis not present

## 2015-07-03 ENCOUNTER — Ambulatory Visit (INDEPENDENT_AMBULATORY_CARE_PROVIDER_SITE_OTHER): Payer: Commercial Managed Care - HMO | Admitting: Family Medicine

## 2015-07-03 ENCOUNTER — Encounter: Payer: Self-pay | Admitting: Family Medicine

## 2015-07-03 VITALS — BP 128/82 | HR 59

## 2015-07-03 DIAGNOSIS — E291 Testicular hypofunction: Secondary | ICD-10-CM

## 2015-07-03 MED ORDER — TESTOSTERONE CYPIONATE 200 MG/ML IM SOLN
200.0000 mg | Freq: Once | INTRAMUSCULAR | Status: AC
  Administered 2015-07-03: 200 mg via INTRAMUSCULAR

## 2015-07-03 NOTE — Progress Notes (Signed)
   Subjective:    Patient ID: Benjamin Powell, male    DOB: July 09, 1945, 70 y.o.   MRN: [id number]  HPI   Patient is here for a testosterone injection. The patient denies blurred vision,headaches, dizziness.  Review of Systems     Objective:   Physical Exam        Assessment & Plan:  Injection administered with no adverse reaction noted

## 2015-07-10 ENCOUNTER — Encounter (HOSPITAL_BASED_OUTPATIENT_CLINIC_OR_DEPARTMENT_OTHER): Payer: Medicare Other

## 2015-07-17 ENCOUNTER — Ambulatory Visit (INDEPENDENT_AMBULATORY_CARE_PROVIDER_SITE_OTHER): Payer: Commercial Managed Care - HMO | Admitting: Family Medicine

## 2015-07-17 VITALS — BP 138/71 | HR 58 | Wt 233.0 lb

## 2015-07-17 DIAGNOSIS — E291 Testicular hypofunction: Secondary | ICD-10-CM | POA: Diagnosis not present

## 2015-07-17 MED ORDER — TESTOSTERONE CYPIONATE 200 MG/ML IM SOLN
200.0000 mg | Freq: Once | INTRAMUSCULAR | Status: AC
  Administered 2015-07-17: 200 mg via INTRAMUSCULAR

## 2015-07-17 NOTE — Progress Notes (Signed)
Patient came into office today for testosterone injection. Denies chest pain, shortness of breath, headaches and problems associated with taking this medication. Patient states he has had no abnornal mood swings. Patient tolerated injection in [place] well without complications. Patient advised to schedule his next injection for 3 weeks from today.

## 2015-07-30 DIAGNOSIS — I159 Secondary hypertension, unspecified: Secondary | ICD-10-CM | POA: Diagnosis not present

## 2015-07-30 DIAGNOSIS — G4733 Obstructive sleep apnea (adult) (pediatric): Secondary | ICD-10-CM | POA: Diagnosis not present

## 2015-07-31 ENCOUNTER — Ambulatory Visit (INDEPENDENT_AMBULATORY_CARE_PROVIDER_SITE_OTHER): Payer: Commercial Managed Care - HMO | Admitting: Family Medicine

## 2015-07-31 VITALS — BP 153/82 | HR 62 | Wt 234.0 lb

## 2015-07-31 DIAGNOSIS — E291 Testicular hypofunction: Secondary | ICD-10-CM

## 2015-07-31 MED ORDER — TESTOSTERONE CYPIONATE 200 MG/ML IM SOLN
200.0000 mg | Freq: Once | INTRAMUSCULAR | Status: AC
  Administered 2015-07-31: 200 mg via INTRAMUSCULAR

## 2015-07-31 NOTE — Progress Notes (Signed)
Benjamin Powell is here for a testosterone injection. Denies chest pain, shortness of breath, headaches or mood changes.    Patient tolerated injection well without complications. Patient advised to schedule next injection 21 days from today.

## 2015-08-13 ENCOUNTER — Ambulatory Visit: Payer: Commercial Managed Care - HMO

## 2015-08-17 ENCOUNTER — Ambulatory Visit (INDEPENDENT_AMBULATORY_CARE_PROVIDER_SITE_OTHER): Payer: Commercial Managed Care - HMO | Admitting: Family Medicine

## 2015-08-17 VITALS — BP 150/84 | HR 62 | Wt 232.0 lb

## 2015-08-17 DIAGNOSIS — I10 Essential (primary) hypertension: Secondary | ICD-10-CM | POA: Diagnosis not present

## 2015-08-17 DIAGNOSIS — E291 Testicular hypofunction: Secondary | ICD-10-CM | POA: Diagnosis not present

## 2015-08-17 MED ORDER — TRAMADOL HCL 50 MG PO TABS: 50.0000 mg | ORAL_TABLET | Freq: Three times a day (TID) | ORAL | Status: DC | PRN

## 2015-08-17 MED ORDER — TESTOSTERONE CYPIONATE 200 MG/ML IM SOLN
200.0000 mg | Freq: Once | INTRAMUSCULAR | Status: AC
  Administered 2015-08-17: 200 mg via INTRAMUSCULAR

## 2015-08-17 MED ORDER — CHLORTHALIDONE 100 MG PO TABS: 100.0000 mg | ORAL_TABLET | Freq: Every day | ORAL | Status: DC

## 2015-08-17 NOTE — Progress Notes (Signed)
[place], Tramadol Rx placed in in-box ready for pickup/faxing. Will you also tell patient that I'd recommend he switch from hydrochlorothiazide and switch to chlorthalidone that I've sent to the [facility] mail order pharmacy.

## 2015-08-17 NOTE — Progress Notes (Signed)
Patient came into office today for testosterone injection. Denies chest pain, shortness of breath, headaches and problems associated with taking this medication. Patient states he has had no abnornal mood swings. Patient BP was elevated, PCP states he is going to adjust his medications. Patient tolerated injection in [place] well without complications. Patient advised to schedule his next injection for 2 weeks from today.

## 2015-08-30 DIAGNOSIS — G4733 Obstructive sleep apnea (adult) (pediatric): Secondary | ICD-10-CM | POA: Diagnosis not present

## 2015-08-30 DIAGNOSIS — I159 Secondary hypertension, unspecified: Secondary | ICD-10-CM | POA: Diagnosis not present

## 2015-09-04 ENCOUNTER — Ambulatory Visit (INDEPENDENT_AMBULATORY_CARE_PROVIDER_SITE_OTHER): Payer: Commercial Managed Care - HMO | Admitting: Family Medicine

## 2015-09-04 VITALS — BP 127/79 | HR 63

## 2015-09-04 DIAGNOSIS — E291 Testicular hypofunction: Secondary | ICD-10-CM | POA: Diagnosis not present

## 2015-09-04 MED ORDER — TESTOSTERONE CYPIONATE 200 MG/ML IM SOLN
200.0000 mg | Freq: Once | INTRAMUSCULAR | Status: AC
  Administered 2015-09-04: 200 mg via INTRAMUSCULAR

## 2015-09-04 NOTE — Progress Notes (Signed)
Benjamin Powell is here for a testosterone injection. Denies chest pain, shortness of breath, headaches or mood changes.   Patient tolerated injection well without complications. Patient advised to schedule next injection 14 days from today.

## 2015-09-10 ENCOUNTER — Telehealth: Payer: Self-pay

## 2015-09-10 DIAGNOSIS — Z961 Presence of intraocular lens: Secondary | ICD-10-CM

## 2015-09-10 NOTE — Telephone Encounter (Signed)
Patient needs a refill to [facility]. Dr Warden Fillers NPI [id number]. ICD 10 Z96.1. Appointment on 09/11/2015 at 10:00 am.

## 2015-09-17 ENCOUNTER — Ambulatory Visit: Payer: Commercial Managed Care - HMO

## 2015-09-18 ENCOUNTER — Ambulatory Visit: Payer: Commercial Managed Care - HMO | Admitting: Family Medicine

## 2015-09-21 ENCOUNTER — Ambulatory Visit: Payer: Commercial Managed Care - HMO | Admitting: Family Medicine

## 2015-09-29 DIAGNOSIS — I159 Secondary hypertension, unspecified: Secondary | ICD-10-CM | POA: Diagnosis not present

## 2015-09-29 DIAGNOSIS — G4733 Obstructive sleep apnea (adult) (pediatric): Secondary | ICD-10-CM | POA: Diagnosis not present

## 2015-10-02 ENCOUNTER — Encounter: Payer: Self-pay | Admitting: Family Medicine

## 2015-10-02 ENCOUNTER — Ambulatory Visit (INDEPENDENT_AMBULATORY_CARE_PROVIDER_SITE_OTHER): Payer: Commercial Managed Care - HMO | Admitting: Family Medicine

## 2015-10-02 ENCOUNTER — Telehealth: Payer: Self-pay | Admitting: *Deleted

## 2015-10-02 VITALS — BP 117/69 | HR 69 | Wt 228.0 lb

## 2015-10-02 DIAGNOSIS — E291 Testicular hypofunction: Secondary | ICD-10-CM

## 2015-10-02 DIAGNOSIS — G4733 Obstructive sleep apnea (adult) (pediatric): Secondary | ICD-10-CM | POA: Diagnosis not present

## 2015-10-02 DIAGNOSIS — I10 Essential (primary) hypertension: Secondary | ICD-10-CM | POA: Diagnosis not present

## 2015-10-02 DIAGNOSIS — E1121 Type 2 diabetes mellitus with diabetic nephropathy: Secondary | ICD-10-CM | POA: Diagnosis not present

## 2015-10-02 LAB — POCT GLYCOSYLATED HEMOGLOBIN (HGB A1C): HEMOGLOBIN A1C: 6.7

## 2015-10-02 MED ORDER — METOPROLOL TARTRATE 100 MG PO TABS: 100.0000 mg | ORAL_TABLET | Freq: Two times a day (BID) | ORAL | Status: DC

## 2015-10-02 MED ORDER — TESTOSTERONE CYPIONATE 200 MG/ML IM SOLN
200.0000 mg | Freq: Once | INTRAMUSCULAR | Status: AC
  Administered 2015-10-02: 200 mg via INTRAMUSCULAR

## 2015-10-02 MED ORDER — AMLODIPINE BESYLATE 10 MG PO TABS: 10.0000 mg | ORAL_TABLET | Freq: Every day | ORAL | Status: DC

## 2015-10-02 MED ORDER — CHLORTHALIDONE 100 MG PO TABS: 100.0000 mg | ORAL_TABLET | Freq: Every day | ORAL | Status: DC

## 2015-10-02 MED ORDER — GLIPIZIDE ER 5 MG PO TB24: 5.0000 mg | ORAL_TABLET | Freq: Every day | ORAL | Status: DC

## 2015-10-02 MED ORDER — METFORMIN HCL 500 MG PO TABS: ORAL_TABLET | ORAL | Status: DC

## 2015-10-02 MED ORDER — LISINOPRIL 40 MG PO TABS
40.0000 mg | ORAL_TABLET | Freq: Every day | ORAL | Status: DC
Start: 2015-10-02 — End: 2016-04-15

## 2015-10-02 NOTE — Telephone Encounter (Signed)
Pt called and states he would like to only take one fluid pill as opposed to two pills a day. Taking the two pills a day makes him go to the bathroom more than he would like. I spoke with Dr. Ileene Rubens and per Dr. Ileene Rubens it is ok for the patient to take one pill a day.

## 2015-10-02 NOTE — Progress Notes (Signed)
CC: Benjamin Powell is a 70 y.o. male is here for Diabetes and testosterone injection   Subjective: HPI:  Follow-up type 2 diabetes: Taking metformin and glipizide with 100% compliance. blood sugars to report. denies polyuria polyphagia or polydipsia. no vision loss.  follow-up essential hypertension: taking amlodipine, chlorthalidone, clonidine, lisinopril, metoprolol on a daily basis with 100% compliance. No outside blood pressures to report. No chest pain shortness of breath orthopnea nor peripheral edema.  Follow-up structures sleep apnea: He tells me he's using his sleep apnea device on a nightly basis for greater than 4 hours and believes it still helping.    Review Of Systems Outlined In HPI  Past Medical History  Diagnosis Date  . Hypertension   . Diabetes mellitus   . Allergy     seasonal  . GERD (gastroesophageal reflux disease)   . Anxiety   . Sleep apnea     Past Surgical History  Procedure Laterality Date  . Ankle surgery  1993    left  . Tonsillectomy and adenoidectomy  1998    and sinus for sleep apnea  . Cholecystectomy  1990  . Ercp  1990   Family History  Problem Relation Age of Onset  . Stroke Brother   . Colitis Brother   . Pancreatic cancer Mother   . Colitis Sister     Social History   Social History  . Marital Status: Married    Spouse Name: N/A  . Number of Children: N/A  . Years of Education: N/A   Occupational History  . Not on file.   Social History Main Topics  . Smoking status: Current Every Day Smoker -- 0.30 packs/day for 50 years    Types: Cigarettes  . Smokeless tobacco: Never Used  . Alcohol Use: No  . Drug Use: No  . Sexual Activity: Not on file   Other Topics Concern  . Not on file   Social History Narrative     Objective: BP 117/69 mmHg  Pulse 69  Wt 228 lb (103.42 kg)  General: Alert and Oriented, No Acute Distress HEENT: Pupils equal, round, reactive to light. Conjunctivae clear. Moist mucous  membranes Lungs: Clear to auscultation bilaterally, no wheezing/ronchi/rales.  Comfortable work of breathing. Good air movement. Cardiac: Regular rate and rhythm. Normal S1/S2.  No murmurs, rubs, nor gallops.   Extremities: No peripheral edema.  Strong peripheral pulses.  Mental Status: No depression, anxiety, nor agitation. Skin: Warm and dry.  Assessment & Plan: [name] was seen today for diabetes and testosterone injection.  Diagnoses and all orders for this visit:  Hypogonadism in male -     testosterone cypionate (DEPOTESTOSTERONE CYPIONATE) injection 200 mg; Inject 1 mL (200 mg total) into the muscle once.  Type 2 diabetes mellitus with diabetic nephropathy, without long-term current use of insulin (HCC) -     POCT HgB A1C  Obstructive sleep apnea  Essential hypertension -     lisinopril (PRINIVIL,ZESTRIL) 40 MG tablet; Take 1 tablet (40 mg total) by mouth daily. -     amLODipine (NORVASC) 10 MG tablet; Take 1 tablet (10 mg total) by mouth daily. -     metoprolol (LOPRESSOR) 100 MG tablet; Take 1 tablet (100 mg total) by mouth 2 (two) times daily.  Other orders -     metFORMIN (GLUCOPHAGE) 500 MG tablet; TAKE 2 TABLET(S) BY MOUTH TWICE DAILY WITH A MEAL -     glipiZIDE (GLIPIZIDE XL) 5 MG 24 hr tablet; Take 1 tablet (5 mg  total) by mouth daily with breakfast. -     chlorthalidone (HYGROTEN) 100 MG tablet; Take 1 tablet (100 mg total) by mouth daily. Replaces Hydrochlorothiazide for BP.   Type 2 diabetes: Controlled with an A1c of 6.7, continue current antihyperglycemic regimen Essential hypertension: Controlled continue current antihypertensive regimen Sleep apnea: Stable and asymptomatic continued nightly CPAP use  Return in about 3 months (around 01/02/2016).

## 2015-10-16 ENCOUNTER — Ambulatory Visit: Payer: Commercial Managed Care - HMO

## 2015-10-23 ENCOUNTER — Ambulatory Visit (INDEPENDENT_AMBULATORY_CARE_PROVIDER_SITE_OTHER): Payer: Commercial Managed Care - HMO | Admitting: Family Medicine

## 2015-10-23 VITALS — BP 118/105 | HR 83 | Wt 223.0 lb

## 2015-10-23 DIAGNOSIS — E291 Testicular hypofunction: Secondary | ICD-10-CM | POA: Diagnosis not present

## 2015-10-23 MED ORDER — TESTOSTERONE CYPIONATE 200 MG/ML IM SOLN
200.0000 mg | Freq: Once | INTRAMUSCULAR | Status: AC
Start: 2015-10-23 — End: 2015-10-23
  Administered 2015-10-23: 200 mg via INTRAMUSCULAR

## 2015-10-23 MED ORDER — TRAMADOL HCL 50 MG PO TABS: 50.0000 mg | ORAL_TABLET | Freq: Three times a day (TID) | ORAL | Status: DC | PRN

## 2015-10-23 NOTE — Progress Notes (Signed)
Patient came into office today for testosterone injection. Denies chest pain, shortness of breath, headaches and problems associated with taking this medication. Patient states he has had no abnornal mood swings. Patient tolerated injection in [place] well without complications. Patient advised to schedule his next injection for 2 weeks from today.  Pt does request refill on Tramadol Rx. Will route to PCP for review. Pt would like it sent to [facility] in [place].

## 2015-10-23 NOTE — Progress Notes (Signed)
approved

## 2015-10-30 DIAGNOSIS — I159 Secondary hypertension, unspecified: Secondary | ICD-10-CM | POA: Diagnosis not present

## 2015-10-30 DIAGNOSIS — G4733 Obstructive sleep apnea (adult) (pediatric): Secondary | ICD-10-CM | POA: Diagnosis not present

## 2015-11-06 ENCOUNTER — Ambulatory Visit: Payer: Commercial Managed Care - HMO

## 2015-11-06 ENCOUNTER — Ambulatory Visit (INDEPENDENT_AMBULATORY_CARE_PROVIDER_SITE_OTHER): Payer: Commercial Managed Care - HMO | Admitting: Sports Medicine

## 2015-11-06 ENCOUNTER — Encounter: Payer: Self-pay | Admitting: Sports Medicine

## 2015-11-06 VITALS — BP 111/72 | HR 61 | Wt 226.0 lb

## 2015-11-06 DIAGNOSIS — M1712 Unilateral primary osteoarthritis, left knee: Secondary | ICD-10-CM | POA: Diagnosis not present

## 2015-11-06 DIAGNOSIS — M5136 Other intervertebral disc degeneration, lumbar region: Secondary | ICD-10-CM

## 2015-11-06 DIAGNOSIS — M51369 Other intervertebral disc degeneration, lumbar region without mention of lumbar back pain or lower extremity pain: Secondary | ICD-10-CM

## 2015-11-06 NOTE — Assessment & Plan Note (Signed)
Continue tramadol, rehabilitation exercises given.  He did well with conservative measures approximately 2 years ago.

## 2015-11-06 NOTE — Assessment & Plan Note (Addendum)
Previous injection was 2 years ago, repeat steroid injection today.  Return as needed.

## 2015-11-06 NOTE — Progress Notes (Signed)
  Subjective:    CC: Left knee pain  HPI: This is a pleasant 70 year old male with known left knee osteoarthritis, previous injection was 2 years ago, now having a recurrence of pain and desires repeat injection today.  Low back pain: Known lumbar degenerative disc disease that responded well to years ago to conservative measures including NSAIDs, tramadol, physical therapy. He is now having recurrence of pain that is axial, discogenic, nothing radicular, no bowel or bladder dysfunction, saddle numbness, constitutional symptoms.  Past medical history, Surgical history, Family history not pertinant except as noted below, Social history, Allergies, and medications have been entered into the medical record, reviewed, and no changes needed.   Review of Systems: No fevers, chills, night sweats, weight loss, chest pain, or shortness of breath.   Objective:    General: Well Developed, well nourished, and in no acute distress.  Neuro: Alert and oriented x3, extra-ocular muscles intact, sensation grossly intact.  HEENT: Normocephalic, atraumatic, pupils equal round reactive to light, neck supple, no masses, no lymphadenopathy, thyroid nonpalpable.  Skin: Warm and dry, no rashes. Cardiac: Regular rate and rhythm, no murmurs rubs or gallops, no lower extremity edema.  Respiratory: Clear to auscultation bilaterally. Not using accessory muscles, speaking in full sentences. Left Knee: Normal to inspection with no erythema or effusion or obvious bony abnormalities. Tender to palpation along the medial joint line ROM normal in flexion and extension and lower leg rotation. Ligaments with solid consistent endpoints including ACL, PCL, LCL, MCL. Negative Mcmurray's and provocative meniscal tests. Non painful patellar compression. Patellar and quadriceps tendons unremarkable. Hamstring and quadriceps strength is normal.  Procedure: Real-time Ultrasound Guided Injection of left knee Device: GE Logiq E    Verbal informed consent obtained.  Time-out conducted.  Noted no overlying erythema, induration, or other signs of local infection.  Skin prepped in a sterile fashion.  Local anesthesia: Topical Ethyl chloride.  With sterile technique and under real time ultrasound guidance:  1 mL kenalog 40, 2 mL lidocaine, 2 mL Marcaine injected easily Completed without difficulty  Pain immediately resolved suggesting accurate placement of the medication.  Advised to call if fevers/chills, erythema, induration, drainage, or persistent bleeding.  Images permanently stored and available for review in the ultrasound unit.  Impression: Technically successful ultrasound guided injection. Impression and Recommendations:

## 2015-11-13 ENCOUNTER — Ambulatory Visit (INDEPENDENT_AMBULATORY_CARE_PROVIDER_SITE_OTHER): Payer: Commercial Managed Care - HMO | Admitting: Osteopathic Medicine

## 2015-11-13 VITALS — BP 129/87 | HR 67 | Wt 221.0 lb

## 2015-11-13 DIAGNOSIS — E291 Testicular hypofunction: Secondary | ICD-10-CM

## 2015-11-13 MED ORDER — TESTOSTERONE CYPIONATE 200 MG/ML IM SOLN
200.0000 mg | Freq: Once | INTRAMUSCULAR | Status: AC
  Administered 2015-11-13: 200 mg via INTRAMUSCULAR

## 2015-11-13 NOTE — Progress Notes (Signed)
Notes reviewed, nothing to add

## 2015-11-13 NOTE — Progress Notes (Signed)
   Subjective:    Patient ID: Benjamin Powell, male    DOB: [birth year], 70 y.o.   MRN: [id number]  HPI Patient came into office today for testosterone injection. Denies chest pain, shortness of breath, headaches and problems associated with taking this medication. Patient states he has had no abnornal mood swings.    Review of Systems     Objective:   Physical Exam        Assessment & Plan:  Patient tolerated injection in [place] well without complications. Patient advised to schedule his next injection for 2 weeks from today.

## 2015-11-27 ENCOUNTER — Ambulatory Visit: Payer: Commercial Managed Care - HMO

## 2015-11-29 DIAGNOSIS — I159 Secondary hypertension, unspecified: Secondary | ICD-10-CM | POA: Diagnosis not present

## 2015-11-29 DIAGNOSIS — G4733 Obstructive sleep apnea (adult) (pediatric): Secondary | ICD-10-CM | POA: Diagnosis not present

## 2015-11-30 ENCOUNTER — Other Ambulatory Visit: Payer: Self-pay

## 2015-11-30 ENCOUNTER — Ambulatory Visit: Payer: Commercial Managed Care - HMO

## 2015-11-30 DIAGNOSIS — I10 Essential (primary) hypertension: Secondary | ICD-10-CM

## 2015-11-30 MED ORDER — CLONIDINE HCL 0.3 MG PO TABS: 0.3000 mg | ORAL_TABLET | Freq: Two times a day (BID) | ORAL | 1 refills | Status: DC

## 2015-12-04 ENCOUNTER — Ambulatory Visit: Payer: Commercial Managed Care - HMO | Admitting: Sports Medicine

## 2015-12-04 ENCOUNTER — Ambulatory Visit (INDEPENDENT_AMBULATORY_CARE_PROVIDER_SITE_OTHER): Payer: Commercial Managed Care - HMO | Admitting: Family Medicine

## 2015-12-04 VITALS — BP 118/67 | HR 61 | Wt 221.0 lb

## 2015-12-04 DIAGNOSIS — E291 Testicular hypofunction: Secondary | ICD-10-CM | POA: Diagnosis not present

## 2015-12-04 MED ORDER — TESTOSTERONE CYPIONATE 200 MG/ML IM SOLN
200.0000 mg | Freq: Once | INTRAMUSCULAR | Status: AC
  Administered 2015-12-04: 200 mg via INTRAMUSCULAR

## 2015-12-04 NOTE — Progress Notes (Signed)
Patient came into office today for testosterone injection. Denies chest pain, shortness of breath, headaches and problems associated with taking this medication. Patient states he has had no abnornal mood swings. Patient tolerated injection in [place] well without complications. Patient advised to schedule his next injection for 2 weeks from today. 

## 2015-12-06 ENCOUNTER — Encounter: Payer: Self-pay | Admitting: Family Medicine

## 2015-12-11 ENCOUNTER — Encounter: Payer: Self-pay | Admitting: Sports Medicine

## 2015-12-11 ENCOUNTER — Ambulatory Visit (INDEPENDENT_AMBULATORY_CARE_PROVIDER_SITE_OTHER): Payer: Commercial Managed Care - HMO | Admitting: Sports Medicine

## 2015-12-11 ENCOUNTER — Telehealth: Payer: Self-pay | Admitting: Sports Medicine

## 2015-12-11 DIAGNOSIS — M1712 Unilateral primary osteoarthritis, left knee: Secondary | ICD-10-CM | POA: Diagnosis not present

## 2015-12-11 NOTE — Assessment & Plan Note (Signed)
Partial response to steroid injection a month ago, he still has some pain so we are going to get him set up for viscous supplementation.

## 2015-12-11 NOTE — Progress Notes (Signed)
  Subjective:    CC: Follow-up  HPI: Left knee osteoarthritis: Significant better after injection but still with some pain at the medial joint line, mild, persistent without radiation, no mechanical symptoms. Agreeable to proceed with viscous supplementation.  Past medical history, Surgical history, Family history not pertinant except as noted below, Social history, Allergies, and medications have been entered into the medical record, reviewed, and no changes needed.   Review of Systems: No fevers, chills, night sweats, weight loss, chest pain, or shortness of breath.   Objective:    General: Well Developed, well nourished, and in no acute distress.  Neuro: Alert and oriented x3, extra-ocular muscles intact, sensation grossly intact.  HEENT: Normocephalic, atraumatic, pupils equal round reactive to light, neck supple, no masses, no lymphadenopathy, thyroid nonpalpable.  Skin: Warm and dry, no rashes. Cardiac: Regular rate and rhythm, no murmurs rubs or gallops, no lower extremity edema.  Respiratory: Clear to auscultation bilaterally. Not using accessory muscles, speaking in full sentences. Left Knee: Only minimal effusion, tender to palpation at the medial joint line ROM normal in flexion and extension and lower leg rotation. Ligaments with solid consistent endpoints including ACL, PCL, LCL, MCL. Negative Mcmurray's and provocative meniscal tests. Non painful patellar compression. Patellar and quadriceps tendons unremarkable. Hamstring and quadriceps strength is normal.  Impression and Recommendations:    Osteoarthritis of left knee Partial response to steroid injection a month ago, he still has some pain so we are going to get him set up for viscous supplementation.

## 2015-12-11 NOTE — Telephone Encounter (Signed)
Submitted for approval on Orthovisc. Awaiting confirmation.  

## 2015-12-11 NOTE — Telephone Encounter (Signed)
-----   Message from Silverio Decamp, MD sent at 12/11/2015  9:34 AM EDT ----- orthovisc left knee please [place]. ___________________________________________ Gwen Her. Dianah Field, M.D., ABFM., CAQSM. Primary Care and [place] Instructor of [place] [employer] [facility]

## 2015-12-15 ENCOUNTER — Ambulatory Visit: Payer: Commercial Managed Care - HMO

## 2015-12-18 ENCOUNTER — Ambulatory Visit (INDEPENDENT_AMBULATORY_CARE_PROVIDER_SITE_OTHER): Payer: Commercial Managed Care - HMO | Admitting: Family Medicine

## 2015-12-18 VITALS — BP 127/71 | HR 61

## 2015-12-18 DIAGNOSIS — E291 Testicular hypofunction: Secondary | ICD-10-CM | POA: Diagnosis not present

## 2015-12-18 MED ORDER — TESTOSTERONE CYPIONATE 200 MG/ML IM SOLN
200.0000 mg | Freq: Once | INTRAMUSCULAR | Status: AC
  Administered 2015-12-18: 200 mg via INTRAMUSCULAR

## 2015-12-18 NOTE — Progress Notes (Signed)
Testosterone injection given LUOQ without complication.

## 2015-12-18 NOTE — Telephone Encounter (Signed)
Pt returned clinic call, advised of his estimated OOP cost. He is going to think about it and let us know. If he decides to proceed, prior-authorization will need to be completed at that time.

## 2015-12-18 NOTE — Telephone Encounter (Signed)
Received the following from OV benefits investigation:  Patient has Medicare Advantage HMO plan with an effective date of 05/10/2015. Orthovisc is no longer considered preferred products with [employer] as of 1- 1-16. To obtain approval for Orthovisc we recommend submitting a Pre-cert along with a letter of medical necessity to [facility] by calling [phone] or faxing to [phone]. [id number] is covered at 80% & [id number] is covered at 100% & [id number] is covered at 100% of the contracted rate when performed in an office setting. *If Out of Pocket is met, Coverage goes to 100% & Copay will be waived. A Copay of $45.00 applies whether or not a Specialist office visit is billed. Ref# [id number]   Attempted to contact Pt regarding estimated OOP cost, left VM to return clinic call. Callback information provided.

## 2015-12-28 ENCOUNTER — Ambulatory Visit: Payer: Commercial Managed Care - HMO | Admitting: Family Medicine

## 2015-12-29 ENCOUNTER — Ambulatory Visit (INDEPENDENT_AMBULATORY_CARE_PROVIDER_SITE_OTHER): Payer: Commercial Managed Care - HMO | Admitting: Family Medicine

## 2015-12-29 ENCOUNTER — Encounter: Payer: Self-pay | Admitting: Family Medicine

## 2015-12-29 VITALS — BP 124/78 | HR 58 | Wt 224.0 lb

## 2015-12-29 DIAGNOSIS — H6121 Impacted cerumen, right ear: Secondary | ICD-10-CM

## 2015-12-29 DIAGNOSIS — F333 Major depressive disorder, recurrent, severe with psychotic symptoms: Secondary | ICD-10-CM | POA: Diagnosis not present

## 2015-12-29 MED ORDER — VENLAFAXINE HCL ER 150 MG PO CP24: 150.0000 mg | ORAL_CAPSULE | Freq: Every day | ORAL | 1 refills | Status: DC

## 2015-12-29 MED ORDER — VENLAFAXINE HCL ER 150 MG PO CP24: 150.0000 mg | ORAL_CAPSULE | Freq: Every day | ORAL | 0 refills | Status: DC

## 2015-12-29 NOTE — Progress Notes (Signed)
CC: Benjamin Powell is a 70 y.o. male is here for Cerumen Impaction   Subjective: HPI:  Bilateral hearing loss present for matter of years, he was being evaluated for hearing aids last week and was told to have his ears cleaned from cerumen impaction. No interventions as of yet. He's never had this done before. He denies any discharge or pain in either ear.  He would like a refill on Effexor. He ran this medication about a day ago. Ever since taking he's no longer having sleepwalking episodes and his wife feels that it's helping stabilize his mood. He denies any depression or anxiety. No thoughts of wanting to harm self or others. Denies any known side effects from this medication   Review Of Systems Outlined In HPI  Past Medical History:  Diagnosis Date  . Allergy    seasonal  . Anxiety   . Diabetes mellitus   . GERD (gastroesophageal reflux disease)   . Hypertension   . Sleep apnea     Past Surgical History:  Procedure Laterality Date  . [place]   left  . CHOLECYSTECTOMY  1990  . ERCP  1990  . TONSILLECTOMY AND ADENOIDECTOMY  1998   and sinus for sleep apnea   Family History  Problem Relation Age of Onset  . Stroke Brother   . Colitis Brother   . Pancreatic cancer Mother   . Colitis Sister     Social History   Social History  . Marital status: Married    Spouse name: N/A  . Number of children: N/A  . Years of education: N/A   Occupational History  . Not on file.   Social History Main Topics  . Smoking status: Current Every Day Smoker    Packs/day: 0.30    Years: 50.00    Types: Cigarettes  . Smokeless tobacco: Never Used  . Alcohol use No  . Drug use: No  . Sexual activity: Not on file   Other Topics Concern  . Not on file   Social History Narrative  . No narrative on file     Objective: BP 124/78   Pulse (!) 58   Wt 224 lb (101.6 kg)   BMI 30.38 kg/m   General: Alert and Oriented, No Acute Distress HEENT: Pupils equal, round,  reactive to light. Conjunctivae clear.  External ears unremarkable, On initial inspection there is a right-sided cerumen impaction following removal canals clear with intact TMs with appropriate landmarks.  Middle ear appears open without effusion. Pink inferior turbinates.  Moist mucous membranes, pharynx without inflammation nor lesions.  Neck supple without palpable lymphadenopathy nor abnormal masses. Lungs: Clear and comfortable work of breathing Cardiac: Regular rate and rhythm.  Extremities: No peripheral edema.  Strong peripheral pulses.  Mental Status: No depression, anxiety, nor agitation. Skin: Warm and dry.  Assessment & Plan: Benjamin Powell was seen today for cerumen impaction.  Diagnoses and all orders for this visit:  Cerumen impaction, right  Severe recurrent major depression with psychotic features, mood-incongruent (Benjamin Powell)  Other orders -     Discontinue: venlafaxine XR (EFFEXOR XR) 150 MG 24 hr capsule; Take 1 capsule (150 mg total) by mouth daily with breakfast. -     venlafaxine XR (EFFEXOR XR) 150 MG 24 hr capsule; Take 1 capsule (150 mg total) by mouth daily with breakfast.    Indication: Cerumen impaction of the ear Medical necessity statement: On physical examination, cerumen impairs clinically significant portions of the external auditory canal, and  tympanic membrane. Noted obstructive, copious cerumen that cannot be removed without magnification and instrumentations requiring physician skills Consent: Discussed benefits and risks of procedure and verbal consent obtained Procedure: Patient was prepped for the procedure. Utilized an otoscope to assess and take note of the ear canal, the tympanic membrane, and the presence, amount, and placement of the cerumen. Gentle water irrigation and soft plastic curette was utilized to remove cerumen.  Post procedure examination: shows cerumen was completely removed. Patient tolerated procedure well. The patient is made aware that they  may experience temporary vertigo, temporary hearing loss, and temporary discomfort. If these symptom last for more than 24 hours to call the clinic or proceed to the ED.    Return if symptoms worsen or fail to improve.

## 2015-12-30 DIAGNOSIS — I159 Secondary hypertension, unspecified: Secondary | ICD-10-CM | POA: Diagnosis not present

## 2015-12-30 DIAGNOSIS — G4733 Obstructive sleep apnea (adult) (pediatric): Secondary | ICD-10-CM | POA: Diagnosis not present

## 2016-01-01 ENCOUNTER — Ambulatory Visit (INDEPENDENT_AMBULATORY_CARE_PROVIDER_SITE_OTHER): Payer: Commercial Managed Care - HMO | Admitting: Family Medicine

## 2016-01-01 VITALS — BP 152/75 | HR 65 | Temp 97.9°F | Resp 16 | Ht 72.0 in | Wt 221.0 lb

## 2016-01-01 DIAGNOSIS — E291 Testicular hypofunction: Secondary | ICD-10-CM | POA: Diagnosis not present

## 2016-01-01 MED ORDER — TESTOSTERONE CYPIONATE 200 MG/ML IM SOLN
200.0000 mg | INTRAMUSCULAR | Status: DC
  Administered 2016-01-01: 200 mg via INTRAMUSCULAR

## 2016-01-01 NOTE — Progress Notes (Signed)
Patient here for testosterone injection. He denies problems with headaches, chest pain, shortness of breath and mood changes. He is an every day smoker and will consider smoking cessation consultation in near future. Patient tolerated injection well without complications. Patient was advised to return for next injection in 14 days. pak

## 2016-01-08 ENCOUNTER — Telehealth: Payer: Self-pay | Admitting: Family Medicine

## 2016-01-08 MED ORDER — TRAMADOL HCL 50 MG PO TABS: 50.0000 mg | ORAL_TABLET | Freq: Three times a day (TID) | ORAL | 0 refills | Status: DC | PRN

## 2016-01-08 NOTE — Telephone Encounter (Signed)
Refill request

## 2016-01-15 ENCOUNTER — Ambulatory Visit (INDEPENDENT_AMBULATORY_CARE_PROVIDER_SITE_OTHER): Payer: Commercial Managed Care - HMO | Admitting: Sports Medicine

## 2016-01-15 VITALS — BP 115/61 | HR 57

## 2016-01-15 DIAGNOSIS — E291 Testicular hypofunction: Secondary | ICD-10-CM | POA: Diagnosis not present

## 2016-01-15 MED ORDER — TESTOSTERONE CYPIONATE 200 MG/ML IM SOLN
200.0000 mg | Freq: Once | INTRAMUSCULAR | Status: AC
  Administered 2016-01-15: 200 mg via INTRAMUSCULAR

## 2016-01-15 NOTE — Progress Notes (Signed)
Patient came into office today for testosterone injection. Denies chest pain, shortness of breath, headaches and problems associated with taking this medication. Patient states he has had no abnornal mood swings. Patient tolerated injection in LUOQ well without complications. Patient advised to schedule his next injection for 2 weeks from today. 

## 2016-01-29 ENCOUNTER — Ambulatory Visit (INDEPENDENT_AMBULATORY_CARE_PROVIDER_SITE_OTHER): Payer: Commercial Managed Care - HMO | Admitting: Sports Medicine

## 2016-01-29 VITALS — BP 131/71 | HR 59 | Wt 223.0 lb

## 2016-01-29 DIAGNOSIS — E291 Testicular hypofunction: Secondary | ICD-10-CM | POA: Diagnosis not present

## 2016-01-29 MED ORDER — TESTOSTERONE CYPIONATE 200 MG/ML IM SOLN: 200.0000 mg | Freq: Once | INTRAMUSCULAR | Status: DC

## 2016-01-29 NOTE — Progress Notes (Signed)
   Subjective:    Patient ID: Benjamin Powell, male    DOB: 1945/12/05, 70 y.o.   MRN: [id number]  HPI  BENY BURAS is here for a testosterone injection. Denies chest pain, shortness of breath, headaches or mood changes.    Review of Systems     Objective:   Physical Exam        Assessment & Plan:  Patient tolerated injection well without complications. Patient advised to schedule next injection 14 days from today.

## 2016-01-30 DIAGNOSIS — I159 Secondary hypertension, unspecified: Secondary | ICD-10-CM | POA: Diagnosis not present

## 2016-01-30 DIAGNOSIS — G4733 Obstructive sleep apnea (adult) (pediatric): Secondary | ICD-10-CM | POA: Diagnosis not present

## 2016-02-12 ENCOUNTER — Ambulatory Visit: Payer: Commercial Managed Care - HMO

## 2016-02-15 ENCOUNTER — Ambulatory Visit: Payer: Commercial Managed Care - HMO

## 2016-02-19 ENCOUNTER — Ambulatory Visit (INDEPENDENT_AMBULATORY_CARE_PROVIDER_SITE_OTHER): Payer: Commercial Managed Care - HMO | Admitting: Family Medicine

## 2016-02-19 VITALS — BP 133/72 | HR 65

## 2016-02-19 DIAGNOSIS — E291 Testicular hypofunction: Secondary | ICD-10-CM | POA: Diagnosis not present

## 2016-02-19 MED ORDER — TESTOSTERONE CYPIONATE 200 MG/ML IM SOLN
200.0000 mg | Freq: Once | INTRAMUSCULAR | Status: AC
  Administered 2016-02-19: 200 mg via INTRAMUSCULAR

## 2016-02-19 NOTE — Progress Notes (Signed)
Testosterone given LUOQ.  No complications.

## 2016-02-29 DIAGNOSIS — I159 Secondary hypertension, unspecified: Secondary | ICD-10-CM | POA: Diagnosis not present

## 2016-02-29 DIAGNOSIS — G4733 Obstructive sleep apnea (adult) (pediatric): Secondary | ICD-10-CM | POA: Diagnosis not present

## 2016-03-02 ENCOUNTER — Other Ambulatory Visit: Payer: Self-pay | Admitting: Physician Assistant

## 2016-03-02 ENCOUNTER — Telehealth: Payer: Self-pay | Admitting: Physician Assistant

## 2016-03-02 DIAGNOSIS — I10 Essential (primary) hypertension: Secondary | ICD-10-CM

## 2016-03-02 MED ORDER — CLONIDINE HCL 0.3 MG PO TABS: 0.3000 mg | ORAL_TABLET | Freq: Two times a day (BID) | ORAL | 0 refills | Status: DC

## 2016-03-02 MED ORDER — CLONIDINE HCL 0.3 MG PO TABS: 0.3000 mg | ORAL_TABLET | Freq: Two times a day (BID) | ORAL | 1 refills | Status: DC

## 2016-03-02 NOTE — Telephone Encounter (Signed)
Refills sent, Pt notified.

## 2016-03-02 NOTE — Telephone Encounter (Signed)
Ok to send both.  

## 2016-03-02 NOTE — Telephone Encounter (Signed)
Pt plans to switch PCP to [facility], PA-C. Has nurse visit set for this week, will set up establish care appt with [facility] at that time. Pt request normal Rx be sent to mail order and short term supply be sent to local since he is out of Rx. Pended refills and will route.

## 2016-03-04 ENCOUNTER — Ambulatory Visit (INDEPENDENT_AMBULATORY_CARE_PROVIDER_SITE_OTHER): Payer: Commercial Managed Care - HMO | Admitting: Physician Assistant

## 2016-03-04 VITALS — BP 116/65 | HR 63 | Temp 97.8°F | Wt 225.0 lb

## 2016-03-04 DIAGNOSIS — Z23 Encounter for immunization: Secondary | ICD-10-CM

## 2016-03-04 DIAGNOSIS — E291 Testicular hypofunction: Secondary | ICD-10-CM

## 2016-03-04 MED ORDER — TESTOSTERONE CYPIONATE 200 MG/ML IM SOLN
200.0000 mg | Freq: Once | INTRAMUSCULAR | Status: AC
  Administered 2016-03-04: 200 mg via INTRAMUSCULAR

## 2016-03-04 NOTE — Progress Notes (Signed)
Patient came into office today for testosterone injection. Denies chest pain, shortness of breath, headaches and problems associated with taking this medication. Patient states he has had no abnornal mood swings. Patient tolerated injection in [place] well without complications. Patient advised to schedule his next injection for 2 weeks from today. Patient is also due for Pneumovax 23, tolerated immunization in Right deltoid well. No immediate complications. Patient reports he is getting an eye exam next week, he will have them fax report to our office. Patient scheduled an establish care with new PCP today while in clinic.

## 2016-03-18 ENCOUNTER — Ambulatory Visit (INDEPENDENT_AMBULATORY_CARE_PROVIDER_SITE_OTHER): Payer: Commercial Managed Care - HMO | Admitting: Osteopathic Medicine

## 2016-03-18 VITALS — BP 126/80 | HR 85

## 2016-03-18 DIAGNOSIS — E291 Testicular hypofunction: Secondary | ICD-10-CM

## 2016-03-18 MED ORDER — TESTOSTERONE CYPIONATE 200 MG/ML IM SOLN
200.0000 mg | Freq: Once | INTRAMUSCULAR | Status: AC
  Administered 2016-03-18: 200 mg via INTRAMUSCULAR

## 2016-03-18 NOTE — Progress Notes (Signed)
Pt is here for a testosterone injection. Denies chest pain. SOB, and mood changes. Pt tolerated injection well in the LUOQ without complications. -[facility]/RMA

## 2016-03-18 NOTE — Progress Notes (Signed)
BP 126/80   Pulse 85   Nurse notes reviewed. Nothing to add.

## 2016-03-21 ENCOUNTER — Ambulatory Visit: Payer: Commercial Managed Care - HMO | Admitting: Physician Assistant

## 2016-03-31 DIAGNOSIS — G4733 Obstructive sleep apnea (adult) (pediatric): Secondary | ICD-10-CM | POA: Diagnosis not present

## 2016-03-31 DIAGNOSIS — I159 Secondary hypertension, unspecified: Secondary | ICD-10-CM | POA: Diagnosis not present

## 2016-04-04 ENCOUNTER — Ambulatory Visit (INDEPENDENT_AMBULATORY_CARE_PROVIDER_SITE_OTHER): Payer: Commercial Managed Care - HMO | Admitting: Family Medicine

## 2016-04-04 ENCOUNTER — Other Ambulatory Visit: Payer: Self-pay

## 2016-04-04 VITALS — BP 136/83 | HR 58

## 2016-04-04 DIAGNOSIS — E291 Testicular hypofunction: Secondary | ICD-10-CM | POA: Diagnosis not present

## 2016-04-04 MED ORDER — TRAMADOL HCL 50 MG PO TABS: 50.0000 mg | ORAL_TABLET | Freq: Three times a day (TID) | ORAL | 0 refills | Status: DC | PRN

## 2016-04-04 MED ORDER — METFORMIN HCL 500 MG PO TABS: 500.0000 mg | ORAL_TABLET | Freq: Two times a day (BID) | ORAL | 0 refills | Status: DC

## 2016-04-04 MED ORDER — TESTOSTERONE CYPIONATE 200 MG/ML IM SOLN
200.0000 mg | Freq: Once | INTRAMUSCULAR | Status: AC
  Administered 2016-04-04: 200 mg via INTRAMUSCULAR

## 2016-04-04 MED ORDER — METFORMIN HCL 500 MG PO TABS: ORAL_TABLET | ORAL | 0 refills | Status: DC

## 2016-04-04 NOTE — Progress Notes (Signed)
Left vm for pt to return call to clinic  

## 2016-04-04 NOTE — Progress Notes (Signed)
Please call patient and see if he will be able to go to the lab before his appointment with [facility]. Please order CMP, testosterone, PSA, lipid panel and hemoglobin A1c. Please also review meds and allergies etc. We can refill his tramadol. all this appointment. Okay to refill other meds including the metformin.  Beatrice Lecher, MD

## 2016-04-04 NOTE — Progress Notes (Signed)
Pt is here for testosterone injection. Denies chest pain, palpitation, and SOB. Pt was advised to make an establish care appointment with new PCP. Pt verbalized understanding and stated that he will make appointment on his way. He also asked for refills on metformin and tramadol. Pt tolerated injection well in the RUOQ.

## 2016-04-06 NOTE — Progress Notes (Signed)
Second vm left pt inregards to lab work

## 2016-04-07 ENCOUNTER — Other Ambulatory Visit: Payer: Self-pay | Admitting: Physician Assistant

## 2016-04-07 ENCOUNTER — Other Ambulatory Visit: Payer: Self-pay

## 2016-04-07 DIAGNOSIS — Z13220 Encounter for screening for lipoid disorders: Secondary | ICD-10-CM

## 2016-04-07 DIAGNOSIS — E118 Type 2 diabetes mellitus with unspecified complications: Secondary | ICD-10-CM

## 2016-04-07 DIAGNOSIS — E291 Testicular hypofunction: Secondary | ICD-10-CM

## 2016-04-07 DIAGNOSIS — I10 Essential (primary) hypertension: Secondary | ICD-10-CM | POA: Diagnosis not present

## 2016-04-08 ENCOUNTER — Other Ambulatory Visit: Payer: Self-pay

## 2016-04-08 DIAGNOSIS — I10 Essential (primary) hypertension: Secondary | ICD-10-CM

## 2016-04-08 LAB — HEMOGLOBIN A1C
Hgb A1c MFr Bld: 6.5 % — ABNORMAL HIGH (ref <5.7)
Mean Plasma Glucose: 140 mg/dL

## 2016-04-08 LAB — LIPID PANEL
CHOL/HDL RATIO: 4.7 ratio (ref <5.0)
CHOLESTEROL: 128 mg/dL (ref <200)
HDL: 27 mg/dL — ABNORMAL LOW (ref >40)
LDL Cholesterol: 56 mg/dL (ref <100)
TRIGLYCERIDES: 223 mg/dL — AB (ref <150)
VLDL: 45 mg/dL — AB (ref <30)

## 2016-04-08 LAB — COMPLETE METABOLIC PANEL WITH GFR
ALT: 21 U/L (ref 9–46)
AST: 15 U/L (ref 10–35)
Albumin: 4.3 g/dL (ref 3.6–5.1)
Alkaline Phosphatase: 48 U/L (ref 40–115)
BUN: 21 mg/dL (ref 7–25)
CHLORIDE: 98 mmol/L (ref 98–110)
CO2: 31 mmol/L (ref 20–31)
Calcium: 10.1 mg/dL (ref 8.6–10.3)
Creat: 1.21 mg/dL (ref 0.70–1.25)
GFR, EST NON AFRICAN AMERICAN: 61 mL/min (ref >=60)
GFR, Est African American: 70 mL/min (ref >=60)
GLUCOSE: 103 mg/dL — AB (ref 65–99)
POTASSIUM: 3.1 mmol/L — AB (ref 3.5–5.3)
SODIUM: 139 mmol/L (ref 135–146)
Total Bilirubin: 0.7 mg/dL (ref 0.2–1.2)
Total Protein: 7 g/dL (ref 6.1–8.1)

## 2016-04-08 LAB — PSA: PSA: 2.5 ng/mL (ref <=4.0)

## 2016-04-08 LAB — TESTOSTERONE: TESTOSTERONE: 1129 ng/dL — AB (ref 250–827)

## 2016-04-08 MED ORDER — METOPROLOL TARTRATE 100 MG PO TABS: 100.0000 mg | ORAL_TABLET | Freq: Two times a day (BID) | ORAL | 0 refills | Status: DC

## 2016-04-15 ENCOUNTER — Ambulatory Visit (INDEPENDENT_AMBULATORY_CARE_PROVIDER_SITE_OTHER): Payer: Commercial Managed Care - HMO | Admitting: Physician Assistant

## 2016-04-15 ENCOUNTER — Encounter: Payer: Self-pay | Admitting: Physician Assistant

## 2016-04-15 VITALS — BP 120/70 | HR 66 | Ht 72.0 in | Wt 228.0 lb

## 2016-04-15 DIAGNOSIS — F17200 Nicotine dependence, unspecified, uncomplicated: Secondary | ICD-10-CM

## 2016-04-15 DIAGNOSIS — K219 Gastro-esophageal reflux disease without esophagitis: Secondary | ICD-10-CM | POA: Insufficient documentation

## 2016-04-15 DIAGNOSIS — E119 Type 2 diabetes mellitus without complications: Secondary | ICD-10-CM | POA: Diagnosis not present

## 2016-04-15 DIAGNOSIS — Z716 Tobacco abuse counseling: Secondary | ICD-10-CM | POA: Diagnosis not present

## 2016-04-15 DIAGNOSIS — I10 Essential (primary) hypertension: Secondary | ICD-10-CM

## 2016-04-15 DIAGNOSIS — E291 Testicular hypofunction: Secondary | ICD-10-CM | POA: Diagnosis not present

## 2016-04-15 DIAGNOSIS — Z720 Tobacco use: Secondary | ICD-10-CM

## 2016-04-15 MED ORDER — CLONIDINE HCL 0.3 MG PO TABS: 0.3000 mg | ORAL_TABLET | Freq: Two times a day (BID) | ORAL | 1 refills | Status: DC

## 2016-04-15 MED ORDER — LISINOPRIL 40 MG PO TABS: 40.0000 mg | ORAL_TABLET | Freq: Every day | ORAL | 1 refills | Status: DC

## 2016-04-15 MED ORDER — GLIPIZIDE ER 5 MG PO TB24: 5.0000 mg | ORAL_TABLET | Freq: Every day | ORAL | 1 refills | Status: DC

## 2016-04-15 MED ORDER — VENLAFAXINE HCL ER 150 MG PO CP24: 150.0000 mg | ORAL_CAPSULE | Freq: Every day | ORAL | 1 refills | Status: DC

## 2016-04-15 MED ORDER — TESTOSTERONE CYPIONATE 200 MG/ML IM SOLN
150.0000 mg | Freq: Once | INTRAMUSCULAR | Status: AC
  Administered 2016-04-15: 150 mg via INTRAMUSCULAR

## 2016-04-15 MED ORDER — AMLODIPINE BESYLATE 10 MG PO TABS: 10.0000 mg | ORAL_TABLET | Freq: Every day | ORAL | 1 refills | Status: DC

## 2016-04-15 MED ORDER — BUPROPION HCL ER (SR) 150 MG PO TB12: 150.0000 mg | ORAL_TABLET | Freq: Two times a day (BID) | ORAL | 1 refills | Status: DC

## 2016-04-15 MED ORDER — CHLORTHALIDONE 100 MG PO TABS: 100.0000 mg | ORAL_TABLET | Freq: Every day | ORAL | 1 refills | Status: DC

## 2016-04-15 MED ORDER — OMEPRAZOLE 40 MG PO CPDR: 40.0000 mg | DELAYED_RELEASE_CAPSULE | Freq: Every day | ORAL | 3 refills | Status: DC

## 2016-04-15 MED ORDER — METFORMIN HCL 500 MG PO TABS: 500.0000 mg | ORAL_TABLET | Freq: Two times a day (BID) | ORAL | 0 refills | Status: DC

## 2016-04-15 MED ORDER — METOPROLOL TARTRATE 100 MG PO TABS: 100.0000 mg | ORAL_TABLET | Freq: Two times a day (BID) | ORAL | 1 refills | Status: DC

## 2016-04-15 NOTE — Progress Notes (Addendum)
Subjective:    Patient ID: Benjamin Powell, male    DOB: [birth year], 70 y.o.   MRN: [id number]  HPI Patient is a 70 yo male coming to the clinic today to establish. Patient is concerned about Metformin due to seeing commercials on Television. Patient denies any adverse effects to Metformin or any other medication that he is currently prescribed. Patient's A1c is 6.5. Patient denies hypoglycemic episodes. He denies foot ulcers.   Patient is interested in quitting smoking. He reports smoking a pack of cigerettes every 1-2 days. He reports smoking since he was 70 yo. He has tried patches and gum with no success. He reports trying Wellbutrin 10 years ago, which helped patient stop smoking for a year. However, he started back smoking after a year. Patient denies cough, shortness of breath and chest pain.   Patient requests refills on his medications.   Past Medical History:  Diagnosis Date  . Allergy    seasonal  . Anxiety   . Diabetes mellitus   . GERD (gastroesophageal reflux disease)   . Hypertension   . Sleep apnea    Current Outpatient Prescriptions on File Prior to Visit  Medication Sig Dispense Refill  . AMBULATORY NON FORMULARY MEDICATION CPAP Device and supplies: 11 cm H2O with a Large size [employer] Nasal Mask Eson mask and heated humidification. Use nightly for OSA. 1 Units 0  . aspirin 81 MG tablet Take 81 mg by mouth daily.    Marland Kitchen testosterone cypionate (DEPOTESTOSTERONE CYPIONATE) 200 MG/ML injection Inject 150 mg into the muscle every 14 (fourteen) days.  10 mL 0  . traMADol (ULTRAM) 50 MG tablet Take 1 tablet (50 mg total) by mouth every 8 (eight) hours as needed. 60 tablet 0  . triamcinolone (KENALOG) 0.1 % paste Apply to cracked lips up to twice a day as needed. 5 g 1   Current Facility-Administered Medications on File Prior to Visit  Medication Dose Route Frequency Provider Last Rate Last Dose  . testosterone cypionate (DEPOTESTOSTERONE CYPIONATE) injection 200 mg   200 mg Intramuscular Once Silverio Decamp, MD       Social History   Social History  . Marital status: Married    Spouse name: N/A  . Number of children: N/A  . Years of education: N/A   Occupational History  . Not on file.   Social History Main Topics  . Smoking status: Current Every Day Smoker    Packs/day: 0.30    Years: 50.00    Types: Cigarettes  . Smokeless tobacco: Never Used  . Alcohol use No  . Drug use: No  . Sexual activity: Not on file   Other Topics Concern  . Not on file   Social History Narrative  . No narrative on file   No Known Allergies   Review of Systems  Constitutional: Negative for activity change, appetite change, fatigue and unexpected weight change.  Respiratory: Negative for cough and shortness of breath.   Cardiovascular: Negative for chest pain, palpitations and leg swelling.       Objective: Blood pressure 120/70, pulse 66, height 6' (1.829 m), weight 228 lb (103.4 kg).   Physical Exam  Constitutional: He appears well-developed and well-nourished. No distress.  Neck:  No carotid bruits auscultated bilaterally.   Cardiovascular: Normal rate, regular rhythm and normal heart sounds.  Exam reveals no gallop and no friction rub.   No murmur heard. Pulmonary/Chest: Effort normal and breath sounds normal. No respiratory distress. He has no wheezes.  He has no rales.  Psychiatric: He has a normal mood and affect. His behavior is normal.   Labs:  Hgb A1c: 6.5   Lipid Panel     Component Value Date/Time   CHOL 128 04/07/2016 1635   TRIG 223 (H) 04/07/2016 1635   HDL 27 (L) 04/07/2016 1635   CHOLHDL 4.7 04/07/2016 1635   VLDL 45 (H) 04/07/2016 1635   LDLCALC 56 04/07/2016 1635   CMP Latest Ref Rng & Units 04/07/2016 05/08/2015 10/11/2013  Glucose 65 - 99 mg/dL 103(H) 147(H) 137(H)  BUN 7 - 25 mg/dL 21 17 13   Creatinine 0.70 - 1.25 mg/dL 1.21 1.20 0.99  Sodium 135 - 146 mmol/L 139 138 141  Potassium 3.5 - 5.3 mmol/L 3.1(L) 3.6  3.5  Chloride 98 - 110 mmol/L 98 100 100  CO2 20 - 31 mmol/L 31 27 30   Calcium 8.6 - 10.3 mg/dL 10.1 9.3 9.4  Total Protein 6.1 - 8.1 g/dL 7.0 6.8 -  Total Bilirubin 0.2 - 1.2 mg/dL 0.7 0.7 -  Alkaline Phos 40 - 115 U/L 48 47 -  AST 10 - 35 U/L 15 15 -  ALT 9 - 46 U/L 21 23 -    Testosterone: 1,129 (elevated)   PSA: 2.5 (normal) Assessment & Plan:  Diagnoses and all orders for this visit:  1. Hypogonadism in male -Testosterone cypionate (DEPOTESTOSTERONE CYPIONATE) injection 150 mg given in the office today. Patient was last given 200 mg two weeks ago; however, last testosterone was elevated at 1,129. Will continue with 150 mg until testosterone goes down. Will re-check testosterone in 4-6 weeks to re-evaluate. Patient scheduled in two weeks for another 150mg  injection.   2. Essential hypertension -Continue AmLODipine (NORVASC) 10 MG tablet; Take 1 tablet (10 mg total) by mouth daily for hypertension.  -Continue chlorthalidone (HYGROTEN) 100 MG tablet; Take 1 tablet (100 mg total) by mouth daily for hypertension.  -CloNIDine (CATAPRES) 0.3 MG tablet; Take 1 tablet (0.3 mg total) by mouth 2 (two) times daily. -Lisinopril (PRINIVIL,ZESTRIL) 40 MG tablet; Take 1 tablet (40 mg total) by mouth daily for hypertension. -Metoprolol (LOPRESSOR) 100 MG tablet; Take 1 tablet (100 mg total) by mouth 2 (two) times daily for hypertension.  -Patient is well controlled on these medications. Today his blood pressure was 120/70.   3. Type 2 diabetes mellitus without complication, without long-term current use of insulin (HCC) -Continue GlipiZIDE (GLIPIZIDE XL) 5 MG 24 hr tablet; Take 1 tablet (5 mg total) by mouth daily with breakfast for glucose control.  -Continue metFORMIN (GLUCOPHAGE) 500 MG tablet; Take 1 tablet (500 mg total) by mouth 2 (two) times daily with a meal for glucose control. Patient was concerned about taking this medication due to side effects he heard about on television; however,  patient denies experiencing any adverse effects to Metformin. Advised patient that this is the recommended medication to take for patient's with diabetes. Patient agreed to stay on Metformin.  -Continue venlafaxine XR (EFFEXOR XR) 150 MG 24 hr capsule; Take 1 capsule (150 mg total) by mouth daily with breakfast for nerve pain.  -Follow-up in three months to re-check Hgb A1c. Goal < 7.  4. Encounter for smoking cessation counseling- Current everyday smoker  30 pack year hx -Start buPROPion (WELLBUTRIN SR) 150 MG 12 hr tablet; Take 1 tablet (150 mg total) by mouth 2 (two) times daily. Patient instructed to taper off of smoking for the next 2-4 weeks. Can stop in 12 weeks if patient has tapered off of  smoking within 3 months. Follow-up in three months to reassess.  -Referral to pulmonology to order low dose CT due to patient having a greater than 30 pack year history and patient is older than 69 yo.   Gastroesophageal reflux disease, esophagitis presence not specified -Continue Omeprazole (PRILOSEC) 40 MG capsule; Take 1 capsule (40 mg total) by mouth daily. Take at dinnertime.

## 2016-04-15 NOTE — Addendum Note (Signed)
Addended by: Donella Stade on: 04/15/2016 01:26 PM   Modules accepted: Orders

## 2016-04-20 ENCOUNTER — Other Ambulatory Visit: Payer: Self-pay | Admitting: Physician Assistant

## 2016-04-29 ENCOUNTER — Ambulatory Visit: Payer: Commercial Managed Care - HMO

## 2016-04-30 DIAGNOSIS — I159 Secondary hypertension, unspecified: Secondary | ICD-10-CM | POA: Diagnosis not present

## 2016-04-30 DIAGNOSIS — G4733 Obstructive sleep apnea (adult) (pediatric): Secondary | ICD-10-CM | POA: Diagnosis not present

## 2016-05-06 ENCOUNTER — Ambulatory Visit: Payer: Commercial Managed Care - HMO

## 2016-05-20 ENCOUNTER — Other Ambulatory Visit: Payer: Self-pay | Admitting: Physician Assistant

## 2016-05-20 ENCOUNTER — Telehealth: Payer: Self-pay | Admitting: Physician Assistant

## 2016-05-20 DIAGNOSIS — M79671 Pain in right foot: Secondary | ICD-10-CM

## 2016-05-20 DIAGNOSIS — M79672 Pain in left foot: Principal | ICD-10-CM

## 2016-05-20 NOTE — Telephone Encounter (Signed)
Sent referral 

## 2016-05-20 NOTE — Progress Notes (Signed)
Pt calls wanting referral for bilateral foot pain. Not seen personally for this issue.

## 2016-05-20 NOTE — Telephone Encounter (Signed)
Can you put in referral for patient to see Podiatry for foot pain and hurting in joints and in between toes. Pt has talked about this with [facility] before and is now ready to see specialist for it

## 2016-05-27 ENCOUNTER — Ambulatory Visit (INDEPENDENT_AMBULATORY_CARE_PROVIDER_SITE_OTHER): Payer: Medicare HMO | Admitting: Physician Assistant

## 2016-05-27 VITALS — BP 130/61 | HR 70 | Wt 224.0 lb

## 2016-05-27 DIAGNOSIS — E291 Testicular hypofunction: Secondary | ICD-10-CM

## 2016-05-27 MED ORDER — TESTOSTERONE CYPIONATE 200 MG/ML IM SOLN
150.0000 mg | Freq: Once | INTRAMUSCULAR | Status: AC
  Administered 2016-05-27: 150 mg via INTRAMUSCULAR

## 2016-05-27 NOTE — Progress Notes (Signed)
   Subjective:    Patient ID: Benjamin Powell, male    DOB: [birth year], 71 y.o.   MRN: [id number]  HPI Pt is here for testosterone injection. Denies chest pain, palpitation, and SOB.     Review of Systems     Objective:   Physical Exam        Assessment & Plan:  Pt tolerated injection on 150 mg well in the RUOQ. Advised to schedule next injection in 14 days.

## 2016-05-31 DIAGNOSIS — I159 Secondary hypertension, unspecified: Secondary | ICD-10-CM | POA: Diagnosis not present

## 2016-05-31 DIAGNOSIS — G4733 Obstructive sleep apnea (adult) (pediatric): Secondary | ICD-10-CM | POA: Diagnosis not present

## 2016-06-06 ENCOUNTER — Ambulatory Visit (INDEPENDENT_AMBULATORY_CARE_PROVIDER_SITE_OTHER): Payer: Medicare HMO | Admitting: Podiatry

## 2016-06-06 VITALS — BP 158/90 | Ht 72.0 in | Wt 226.0 lb

## 2016-06-06 DIAGNOSIS — M2040 Other hammer toe(s) (acquired), unspecified foot: Secondary | ICD-10-CM | POA: Diagnosis not present

## 2016-06-06 DIAGNOSIS — M79672 Pain in left foot: Secondary | ICD-10-CM

## 2016-06-06 DIAGNOSIS — Q828 Other specified congenital malformations of skin: Secondary | ICD-10-CM | POA: Diagnosis not present

## 2016-06-06 DIAGNOSIS — M79671 Pain in right foot: Secondary | ICD-10-CM

## 2016-06-06 DIAGNOSIS — M7750 Other enthesopathy of unspecified foot: Secondary | ICD-10-CM

## 2016-06-06 NOTE — Progress Notes (Signed)
SUBJECTIVE: 71 y.o. year old male presents complaining of pain on 4th and 5th digits bilateral duration of 18 years. Many years ago he has had the lesions trimmed by a podiatrist. For the past many years the pain is on going as long as he is on feet with closed in shoes. Been diabetic x 10 years. His sugar is under control and last A1c was 6.   REVIEW OF SYSTEMS: Pertinent items noted in HPI and remainder of comprehensive ROS otherwise negative.  OBJECTIVE: DERMATOLOGIC EXAMINATION: Interdigital white soft keratotic lesion at the proximal skin folder of 4th web space right foot, symptomatic. Soft inter digital corn at contact surface between 4th and 5th digit left foot, symptomatic.  VASCULAR EXAMINATION OF LOWER LIMBS: All pedal pulses are palpable with normal pulsation.  Capillary Filling times within 3 seconds in all digits.  Noted of no edema or erythema associate with painful lesions. Temperature gradient from tibial crest to dorsum of foot is within normal bilateral.  NEUROLOGIC EXAMINATION OF THE LOWER LIMBS: Achilles DTR is present and within normal. Monofilament (Semmes-Weinstein 10-gm) sensory testing positive 6 out of 6, bilateral. Vibratory sensations(128Hz  turning fork) intact at medial and lateral forefoot bilateral.  Sharp and Dull discriminatory sensations at the plantar ball of hallux is intact bilateral.   MUSCULOSKELETAL EXAMINATION: Positive for digital contracture 4th and 5th bilateral with phalangeal spur 5th left.   ASSESSMENT: Phalangeal spur 4th and 5th left with soft keratotic lesions. Contracted digits with inter digital lesion 4th web space right.  PLAN: Reviewed clinical findings and available treatment options. Lesions debrided on both feet.  Right 4th web space debrided and dressing applied. As per request, pre op consent form reviewed for Partial syndactily with bone resection on right foot,  Partial bone spur removal from 5th toe left. Patient  will set his surgery date in near future.

## 2016-06-06 NOTE — Patient Instructions (Signed)
Seen for painful toes 4th and 5th bilateral. Noted of inter digital corn on 4th and 5th digits bilateral. Lesion debrided. Right 4th web space debrided and dressing applied. Pre op consent form reviewed for Partial syndactily with bone resection on right foot. Partial bone spur removal from 5th toe left.

## 2016-06-07 ENCOUNTER — Encounter: Payer: Self-pay | Admitting: Podiatry

## 2016-06-10 ENCOUNTER — Ambulatory Visit (INDEPENDENT_AMBULATORY_CARE_PROVIDER_SITE_OTHER): Payer: Medicare HMO | Admitting: Physician Assistant

## 2016-06-10 VITALS — BP 147/78 | HR 66

## 2016-06-10 DIAGNOSIS — E291 Testicular hypofunction: Secondary | ICD-10-CM | POA: Diagnosis not present

## 2016-06-10 MED ORDER — TESTOSTERONE CYPIONATE 200 MG/ML IM SOLN
200.0000 mg | Freq: Once | INTRAMUSCULAR | Status: AC
  Administered 2016-06-10: 200 mg via INTRAMUSCULAR

## 2016-06-10 NOTE — Progress Notes (Signed)
Patient came into office today for testosterone injection. Denies chest pain, shortness of breath, headaches and problems associated with taking this medication. Patient states he has had no abnornal mood swings. Patient tolerated injection in LUOQ well without complications. Patient advised to schedule his next injection for 2 weeks from today. 

## 2016-06-13 ENCOUNTER — Ambulatory Visit: Payer: Medicare HMO | Admitting: Podiatry

## 2016-06-16 ENCOUNTER — Encounter: Payer: Self-pay | Admitting: Podiatry

## 2016-06-16 ENCOUNTER — Ambulatory Visit (INDEPENDENT_AMBULATORY_CARE_PROVIDER_SITE_OTHER): Payer: Medicare HMO | Admitting: Podiatry

## 2016-06-16 DIAGNOSIS — M7750 Other enthesopathy of unspecified foot: Secondary | ICD-10-CM

## 2016-06-16 DIAGNOSIS — M79671 Pain in right foot: Secondary | ICD-10-CM | POA: Diagnosis not present

## 2016-06-16 DIAGNOSIS — Q828 Other specified congenital malformations of skin: Secondary | ICD-10-CM

## 2016-06-16 DIAGNOSIS — M79672 Pain in left foot: Secondary | ICD-10-CM | POA: Diagnosis not present

## 2016-06-16 DIAGNOSIS — M2040 Other hammer toe(s) (acquired), unspecified foot: Secondary | ICD-10-CM | POA: Diagnosis not present

## 2016-06-16 NOTE — Patient Instructions (Signed)
Seen for hypertrophic nails. All nails debrided. Return in 3 months or as needed.  

## 2016-06-16 NOTE — Progress Notes (Addendum)
SUBJECTIVE: 71 y.o. year old male presents to evaluate possible surgical option for the painful lesions on both feet.  Stated that trimming of the lesion during the last visit (06/06/16) never stop the pain completely.  Both feet in between 4th and 5th digits are still painful to stand or walk. Stated that at times this pain continues through out the day including while in bed that often disturbs his sleep.  At this time patient request for surgical intervention to resolve on going foot pain. Been diabetic x 10 years. His sugar is under control and last A1c was 6.  HPI: He had painful lesions in between 4th and 5th digits bilateral duration of 18 years. Positive history of being treated by a podiatrist once in past with minimum improvement. For the past many years the pain is on going as long as he is on feet with closed in shoes. Past treatment includes changing shoes, using silicone wedge in between toes, trimming, and using Advil for pain. None of the past treatment provided him satisfying or long lasting relief.  REVIEW OF SYSTEMS: Pertinent items noted in HPI and remainder of comprehensive ROS otherwise negative.  OBJECTIVE: DERMATOLOGIC EXAMINATION: Recurring interdigital white soft keratotic lesion at the proximal skin folder of 4th web space right foot, symptomatic. Soft inter digital corn at contact surface between 4th and 5th digit left foot, symptomatic.  VASCULAR EXAMINATION OF LOWER LIMBS: Vascular status of lower limbs are within normal with all palpable pedal pulses.  NEUROLOGIC EXAMINATION OF THE LOWER LIMBS: All epicritic and tactile sensations grossly intact.    MUSCULOSKELETAL EXAMINATION: Positive for digital contracture 4th and 5th bilateral with phalangeal spur 5th left, symptomatic.  Radiographic examination of both feet are done.   AP view of Right foot reveals  Adducted metatarsal bone with accessory bone at medial border of 5th metatarsal head.   Fibular  sesamoid position at 5. Irregular and enlarged phalangeal head distal lateral segment of right great toe distal phalanx. Positive of enlarged phalangeal head with varus rotated 5th digit with close proximity with adjacent 4th toe at the proximal phalanx right right foot Left foot AP reveal, short first metatarsal with adducted lesser metatarsal bone, Fibular sesamoid position at 4. Positive for varus contracture 4th and 5th digits with elongated and enlarged distal phalanx that is pressing against the lateral border of 4th digit PIPJ area.  Also reveal deviated phalangeal alignment of 3rd digit left, plus excess bone formation at medial and lateral cortical surface of distal phalanx left great toe.   Lateral view: right: High arched supinated foot with excess bone formation plantar and posterior calcaneus. Positive subungual exostosis right hallux. Left: High arched cavus foot with old avulsion fracture with free ossicle at posterior heel .  Subtalar joint surface is obliterated without clear outline of joint space.  Positive of subungual exostosis left great toe.  Radiographic findings are consistent with clinical findings of excess bone formation of distal phalangeal bone left, enlarged phalangeal bone 5th right, and excess pressure between adjacent digits of 4th and 5th digits bilateral.   Assessment: Left foot: Bone spur with interdigital skin lesion 4th and 5th digit left, which failed to improve with conservative treatment. Right foot: Varus rotated hypertrophic phalangeal bone with excess pressure between digits with interdigital skin lesion that is pre ulcerative on right 4th web space involving 4th and 5th digit, failed to improve with conservative treatments, such as debridement, change shoe gear, rest, NSAIA.  Plan:  Reviewed clinical and x-ray findings  and available treatment options with the patient. Patient requested for surgical intervention at this time in hopes of achieving long  lasting relief of pain and prevent possible ulceration from interdigital skin lesions. As per clinical findings and past history, it may benefit to reduce excess bone formation 5th digit left, remove enlarged phalangeal bone and excision of skin lesion  on the 4th web space via partial syndactily and phalangeal head resection 5th right.                                                                                                                                                                                                                                                                                                     +.......................+....................  +  ASSESSMENT: Phalangeal spur 4th and 5th left with soft keratotic lesions, failed to improve with conservative treatment such as changing shoes, padding, trimming.  Contracted digits with inter digital lesion 4th web space right, pre ulcerative, failed to improve with conservative treatment such as changing shoe gear, padding, trimming, and NSAIA.  PLAN: Reviewed clinical findings and available treatment options. Lesions debrided on both feet.  Right 4th web space debrided and dressing applied. As per request, pre op consent form reviewed for Partial syndactily with bone resection on right foot,  Partial bone spur removal from 5th toe left. Patient will set his surgery date in near future

## 2016-06-24 ENCOUNTER — Ambulatory Visit: Payer: Medicare HMO

## 2016-06-27 ENCOUNTER — Ambulatory Visit (INDEPENDENT_AMBULATORY_CARE_PROVIDER_SITE_OTHER): Payer: Medicare HMO | Admitting: Physician Assistant

## 2016-06-27 VITALS — BP 123/80 | HR 66 | Ht 72.5 in | Wt 224.3 lb

## 2016-06-27 DIAGNOSIS — E291 Testicular hypofunction: Secondary | ICD-10-CM | POA: Diagnosis not present

## 2016-06-27 MED ORDER — TESTOSTERONE CYPIONATE 200 MG/ML IM SOLN
150.0000 mg | Freq: Once | INTRAMUSCULAR | Status: AC
  Administered 2016-06-27: 150 mg via INTRAMUSCULAR

## 2016-06-27 NOTE — Progress Notes (Signed)
   Subjective:    Patient ID: Benjamin Powell, male    DOB: 1946-02-27, 71 y.o.   MRN: [id number]  HPI Pt is here for a testosterone injection. Denies chest pain, shortness of breath, headaches and problems with medication or mood changes.   Review of Systems  Respiratory: Negative for shortness of breath.   Cardiovascular: Negative for chest pain.  Neurological: Negative for headaches.       Objective:   Physical Exam        Assessment & Plan:  Pt tolerated injection in ROUQ well without complications. Pt advised to schedule next injection in 14 days.

## 2016-07-01 DIAGNOSIS — G4733 Obstructive sleep apnea (adult) (pediatric): Secondary | ICD-10-CM | POA: Diagnosis not present

## 2016-07-01 DIAGNOSIS — I159 Secondary hypertension, unspecified: Secondary | ICD-10-CM | POA: Diagnosis not present

## 2016-07-03 ENCOUNTER — Other Ambulatory Visit: Payer: Self-pay

## 2016-07-03 DIAGNOSIS — E119 Type 2 diabetes mellitus without complications: Secondary | ICD-10-CM

## 2016-07-03 MED ORDER — METFORMIN HCL 500 MG PO TABS: 500.0000 mg | ORAL_TABLET | Freq: Two times a day (BID) | ORAL | 0 refills | Status: DC

## 2016-07-15 ENCOUNTER — Ambulatory Visit: Payer: Medicare HMO

## 2016-07-15 ENCOUNTER — Ambulatory Visit (INDEPENDENT_AMBULATORY_CARE_PROVIDER_SITE_OTHER): Payer: Medicare HMO | Admitting: Physician Assistant

## 2016-07-15 ENCOUNTER — Other Ambulatory Visit: Payer: Self-pay | Admitting: Physician Assistant

## 2016-07-15 ENCOUNTER — Encounter: Payer: Self-pay | Admitting: Physician Assistant

## 2016-07-15 VITALS — BP 130/74 | HR 77 | Ht 72.5 in | Wt 220.0 lb

## 2016-07-15 DIAGNOSIS — E119 Type 2 diabetes mellitus without complications: Secondary | ICD-10-CM | POA: Diagnosis not present

## 2016-07-15 DIAGNOSIS — E291 Testicular hypofunction: Secondary | ICD-10-CM

## 2016-07-15 LAB — POCT GLYCOSYLATED HEMOGLOBIN (HGB A1C): Hemoglobin A1C: 7.2

## 2016-07-15 MED ORDER — GLIPIZIDE ER 5 MG PO TB24: 5.0000 mg | ORAL_TABLET | Freq: Every day | ORAL | 1 refills | Status: DC

## 2016-07-15 MED ORDER — METFORMIN HCL 1000 MG PO TABS: 1000.0000 mg | ORAL_TABLET | Freq: Two times a day (BID) | ORAL | 1 refills | Status: DC

## 2016-07-15 MED ORDER — TRAMADOL HCL 50 MG PO TABS: 50.0000 mg | ORAL_TABLET | Freq: Three times a day (TID) | ORAL | 0 refills | Status: DC | PRN

## 2016-07-15 MED ORDER — TESTOSTERONE CYPIONATE 200 MG/ML IM SOLN
150.0000 mg | Freq: Once | INTRAMUSCULAR | Status: AC
  Administered 2016-07-15: 150 mg via INTRAMUSCULAR

## 2016-07-15 NOTE — Progress Notes (Signed)
   Subjective:    Patient ID: Benjamin Powell, male    DOB: 16-Nov-1945, 71 y.o.   MRN: [id number]  HPI Pt is a 71 yo male who presents to the clinic for DM follow up. He has no complaints. He denies any hypoglycemia. No open sores or wounds. He does not check his sugars. He is not keeping to a diabetic diet. Admits to running out of metformin for last month.   Feeling great with testosterone. No compliants. Was elevated last time checked. Decreased dose. Needs recheck.    Review of Systems  All other systems reviewed and are negative.      Objective:   Physical Exam  Constitutional: He is oriented to person, place, and time. He appears well-developed and well-nourished.  HENT:  Head: Normocephalic and atraumatic.  Cardiovascular: Normal rate, regular rhythm and normal heart sounds.   Pulmonary/Chest: Effort normal and breath sounds normal.  Neurological: He is alert and oriented to person, place, and time.  Psychiatric: He has a normal mood and affect. His behavior is normal.          Assessment & Plan:  Marland KitchenMarland KitchenIsay was seen today for diabetes and hypogonadism.  Diagnoses and all orders for this visit:  Type 2 diabetes mellitus without complication, without long-term current use of insulin (HCC) -     POCT HgB A1C -     Discontinue: glipiZIDE (GLIPIZIDE XL) 5 MG 24 hr tablet; Take 1 tablet (5 mg total) by mouth daily with breakfast. -     metFORMIN (GLUCOPHAGE) 1000 MG tablet; Take 1 tablet (1,000 mg total) by mouth 2 (two) times daily with a meal. -     glipiZIDE (GLIPIZIDE XL) 5 MG 24 hr tablet; Take 1 tablet (5 mg total) by mouth daily with breakfast.  Hypogonadism in male -     testosterone cypionate (DEPOTESTOSTERONE CYPIONATE) injection 150 mg; Inject 0.75 mLs (150 mg total) into the muscle once. -     Testosterone -     CBC  Other orders -     traMADol (ULTRAM) 50 MG tablet; Take 1 tablet (50 mg total) by mouth every 8 (eight) hours as needed. -     Discontinue:  metFORMIN (GLUCOPHAGE) 1000 MG tablet; Take 1 tablet (1,000 mg total) by mouth 2 (two) times daily with a meal.   .. Lab Results  Component Value Date   HGBA1C 7.2 07/15/2016   A1C went up over last 3 months.  He did run out of metformin for last month.  Admits to eating bags of jelly beans at a time.  Discussed taking both medications and getting back to a diabetic diet.  Follow up in 3 months.   Printed testosterone lab to have checked in 1 week in am to confirm in normal range.

## 2016-07-29 ENCOUNTER — Ambulatory Visit (INDEPENDENT_AMBULATORY_CARE_PROVIDER_SITE_OTHER): Payer: Medicare HMO | Admitting: Physician Assistant

## 2016-07-29 VITALS — BP 105/63 | HR 57 | Wt 221.0 lb

## 2016-07-29 DIAGNOSIS — E291 Testicular hypofunction: Secondary | ICD-10-CM

## 2016-07-29 MED ORDER — TESTOSTERONE CYPIONATE 200 MG/ML IM SOLN
150.0000 mg | Freq: Once | INTRAMUSCULAR | Status: AC
  Administered 2016-07-29: 150 mg via INTRAMUSCULAR

## 2016-07-29 NOTE — Progress Notes (Signed)
Pt here for testosterone injection.  Denies chest pain, shortness of breath, headaches and problems with medication or mood changes.   Pt tolerated injection in the RUOQ well and without complications.  Pt advised to make appointment in 14 days for next injection.

## 2016-08-12 ENCOUNTER — Ambulatory Visit: Payer: Medicare HMO

## 2016-09-02 ENCOUNTER — Telehealth: Payer: Self-pay | Admitting: Physician Assistant

## 2016-09-02 NOTE — Telephone Encounter (Signed)
The referral is for Dr. Lyman Bishop fax number is [phone], if you need any info call the mobile number on file

## 2016-09-05 NOTE — Telephone Encounter (Signed)
Why does he want to see cardiology?

## 2016-09-14 ENCOUNTER — Other Ambulatory Visit: Payer: Self-pay | Admitting: Podiatry

## 2016-09-14 MED ORDER — ACETAMINOPHEN-CODEINE #3 300-30 MG PO TABS: 1.0000 | ORAL_TABLET | ORAL | 0 refills | Status: DC | PRN

## 2016-09-16 DIAGNOSIS — M2570 Osteophyte, unspecified joint: Secondary | ICD-10-CM | POA: Diagnosis not present

## 2016-09-16 DIAGNOSIS — M2041 Other hammer toe(s) (acquired), right foot: Secondary | ICD-10-CM | POA: Diagnosis not present

## 2016-09-16 DIAGNOSIS — L97509 Non-pressure chronic ulcer of other part of unspecified foot with unspecified severity: Secondary | ICD-10-CM | POA: Diagnosis not present

## 2016-09-16 HISTORY — PX: OTHER SURGICAL HISTORY: SHX169

## 2016-09-19 ENCOUNTER — Encounter: Payer: Medicare HMO | Admitting: Podiatry

## 2016-09-19 ENCOUNTER — Other Ambulatory Visit: Payer: Self-pay | Admitting: Physician Assistant

## 2016-09-19 DIAGNOSIS — E119 Type 2 diabetes mellitus without complications: Secondary | ICD-10-CM

## 2016-09-20 ENCOUNTER — Encounter: Payer: Self-pay | Admitting: *Deleted

## 2016-09-21 ENCOUNTER — Encounter: Payer: Medicare HMO | Admitting: Podiatry

## 2016-09-22 ENCOUNTER — Ambulatory Visit (INDEPENDENT_AMBULATORY_CARE_PROVIDER_SITE_OTHER): Payer: Medicare HMO | Admitting: Podiatry

## 2016-09-22 ENCOUNTER — Encounter: Payer: Self-pay | Admitting: Podiatry

## 2016-09-22 DIAGNOSIS — M2041 Other hammer toe(s) (acquired), right foot: Secondary | ICD-10-CM

## 2016-09-22 DIAGNOSIS — M7750 Other enthesopathy of unspecified foot: Secondary | ICD-10-CM

## 2016-09-22 DIAGNOSIS — M2042 Other hammer toe(s) (acquired), left foot: Secondary | ICD-10-CM | POA: Diagnosis not present

## 2016-09-22 DIAGNOSIS — M2040 Other hammer toe(s) (acquired), unspecified foot: Secondary | ICD-10-CM

## 2016-09-22 NOTE — Progress Notes (Signed)
1 week post op following Hammer toe 5 with partial syndactily 4th web right, partial bone resection 5th left. Patient denies any major pain or discomfort. Incision sites all dry and well coapted.  Left foot 5th toe sutures removed and covered with Mefix tape. Right foot cleansed with Iodine and DSS applied. Keep the right foot clean and dry. Return in one week.

## 2016-09-22 NOTE — Patient Instructions (Signed)
Normal post op wound healing without complication. Post op x-ray done. Left foot 5th toe sutures removed. Keep the right foot bandage clean and dry. Return in one week.

## 2016-09-23 ENCOUNTER — Encounter: Payer: Self-pay | Admitting: Internal Medicine

## 2016-09-23 ENCOUNTER — Ambulatory Visit (INDEPENDENT_AMBULATORY_CARE_PROVIDER_SITE_OTHER): Payer: Medicare HMO | Admitting: Internal Medicine

## 2016-09-23 VITALS — BP 116/73 | HR 59 | Ht 72.0 in | Wt 220.0 lb

## 2016-09-23 DIAGNOSIS — H02423 Myogenic ptosis of bilateral eyelids: Secondary | ICD-10-CM | POA: Diagnosis not present

## 2016-09-23 DIAGNOSIS — H40013 Open angle with borderline findings, low risk, bilateral: Secondary | ICD-10-CM | POA: Diagnosis not present

## 2016-09-23 DIAGNOSIS — E119 Type 2 diabetes mellitus without complications: Secondary | ICD-10-CM

## 2016-09-23 DIAGNOSIS — H02831 Dermatochalasis of right upper eyelid: Secondary | ICD-10-CM | POA: Diagnosis not present

## 2016-09-23 DIAGNOSIS — Z961 Presence of intraocular lens: Secondary | ICD-10-CM | POA: Diagnosis not present

## 2016-09-23 DIAGNOSIS — H26491 Other secondary cataract, right eye: Secondary | ICD-10-CM | POA: Diagnosis not present

## 2016-09-23 DIAGNOSIS — I10 Essential (primary) hypertension: Secondary | ICD-10-CM | POA: Diagnosis not present

## 2016-09-23 DIAGNOSIS — G4733 Obstructive sleep apnea (adult) (pediatric): Secondary | ICD-10-CM | POA: Diagnosis not present

## 2016-09-23 DIAGNOSIS — H43812 Vitreous degeneration, left eye: Secondary | ICD-10-CM | POA: Diagnosis not present

## 2016-09-23 DIAGNOSIS — H02834 Dermatochalasis of left upper eyelid: Secondary | ICD-10-CM | POA: Diagnosis not present

## 2016-09-23 DIAGNOSIS — H2512 Age-related nuclear cataract, left eye: Secondary | ICD-10-CM | POA: Diagnosis not present

## 2016-09-23 MED ORDER — VALSARTAN 320 MG PO TABS: 320.0000 mg | ORAL_TABLET | Freq: Every day | ORAL | 5 refills | Status: DC

## 2016-09-23 NOTE — Progress Notes (Signed)
OFFICE FOLLOW-UP NOTE  Chief Complaint:  Establish cardiologist  Primary Care Physician: Donella Stade, PA-C  HPI:  Benjamin Powell is a 71 y.o. male with a past medial history significant for long-standing hypertension dating back more than 20 years. He had his wife previously lived in [place] and he was seen a cardiologist there. He reports having undergone numerous tests without a clear etiology for the cause of resistant hypertension. He was put on a number of medications and had good blood pressure control. He notes that they moved to [place] slightly more than 15 years ago and at the time his blood pressure was elevated. His doctors increase his blood pressure medicines and added additional medications over the next several years and eventually got control of his blood pressure. Per his wife, who is a Copywriter, advertising, his blood pressure has been adequately controlled for about 7 years. Recently she's noted that he's had significant worsening of gingival hyperplasia, suspected to be a result of his amlodipine. She is now interested in trying to get him off of some of his medications to avoid side effects. [name] also reports that he is very fatigued, likely related to high-dose clonidine. His current regimen includes amlodipine 10 mg daily, chlorthalidone 100 mg daily, clonidine 0.3 mg twice a day, lisinopril 40 mg daily and Toprol tartrate 100 mg twice a day. Blood pressure in the office today was well controlled at 116/73. He does have a history of obstructive sleep apnea and is on CPAP. Apparently he had workup for renal artery stenosis in the past which was negative, however he cannot recall workup for hyperaldosteronism. He is a current and ongoing smoker about 1 pack every 3 days but his smoking for more than 52 years and worked in [employer]. He denies any alcohol use and is not active and does not exercise.  PMHx:  Past Medical History:  Diagnosis Date  . Allergy     seasonal  . Anxiety   . Diabetes mellitus   . GERD (gastroesophageal reflux disease)   . Hypertension   . Sleep apnea     Past Surgical History:  Procedure Laterality Date  . [place]   left  . CHOLECYSTECTOMY  1990  . ERCP  1990  . Hammer Toe Repair Right 09/16/2016   RT #5  . Partial Excision Phalanx Left 09/16/2016   LT#5  . TONSILLECTOMY AND ADENOIDECTOMY  1998   and sinus for sleep apnea    FAMHx:  Family History  Problem Relation Age of Onset  . Stroke Brother   . Colitis Brother   . Pancreatic cancer Mother   . Colitis Sister     SOCHx:   reports that he has been smoking Cigarettes.  He has a 15.00 pack-year smoking history. He has never used smokeless tobacco. He reports that he does not drink alcohol or use drugs.  ALLERGIES:  No Known Allergies  ROS: Pertinent items noted in HPI and remainder of comprehensive ROS otherwise negative.  HOME MEDS: Current Outpatient Prescriptions on File Prior to Visit  Medication Sig Dispense Refill  . AMBULATORY NON FORMULARY MEDICATION CPAP Device and supplies: 11 cm H2O with a Large size [employer] Nasal Mask Eson mask and heated humidification. Use nightly for OSA. 1 Units 0  . amLODipine (NORVASC) 10 MG tablet Take 1 tablet (10 mg total) by mouth daily. 90 tablet 1  . aspirin 81 MG tablet Take 81 mg by mouth daily.    Marland Kitchen  chlorthalidone (HYGROTEN) 100 MG tablet Take 1 tablet (100 mg total) by mouth daily. Replaces Hydrochlorothiazide for BP. 90 tablet 1  . cloNIDine (CATAPRES) 0.3 MG tablet Take 1 tablet (0.3 mg total) by mouth 2 (two) times daily. 180 tablet 1  . glipiZIDE (GLIPIZIDE XL) 5 MG 24 hr tablet Take 1 tablet (5 mg total) by mouth daily with breakfast. 90 tablet 1  . metFORMIN (GLUCOPHAGE) 1000 MG tablet Take 1 tablet (1,000 mg total) by mouth 2 (two) times daily with a meal. 180 tablet 1  . metoprolol (LOPRESSOR) 100 MG tablet Take 1 tablet (100 mg total) by mouth 2 (two) times daily. 180  tablet 1  . omeprazole (PRILOSEC) 40 MG capsule Take 1 capsule (40 mg total) by mouth daily. Take at dinnertime. 90 capsule 3  . traMADol (ULTRAM) 50 MG tablet Take 1 tablet (50 mg total) by mouth every 8 (eight) hours as needed. 60 tablet 0  . triamcinolone (KENALOG) 0.1 % paste Apply to cracked lips up to twice a day as needed. 5 g 1  . venlafaxine XR (EFFEXOR-XR) 150 MG 24 hr capsule TAKE 1 CAPSULE (150 MG TOTAL) BY MOUTH DAILY WITH BREAKFAST. 90 capsule 1   No current facility-administered medications on file prior to visit.     LABS/IMAGING: No results found for this or any previous visit (from the past 48 hour(s)). No results found.  LIPID PANEL:    Component Value Date/Time   CHOL 128 04/07/2016 1635   TRIG 223 (H) 04/07/2016 1635   HDL 27 (L) 04/07/2016 1635   CHOLHDL 4.7 04/07/2016 1635   VLDL 45 (H) 04/07/2016 1635   LDLCALC 56 04/07/2016 1635     WEIGHTS: Wt Readings from Last 3 Encounters:  09/23/16 220 lb (99.8 kg)  07/29/16 221 lb (100.2 kg)  07/15/16 220 lb (99.8 kg)    VITALS: BP 116/73   Pulse (!) 59   Ht 6' (1.829 m)   Wt 220 lb (99.8 kg)   BMI 29.84 kg/m   EXAM: General appearance: alert and no distress Neck: no carotid bruit and no JVD Lungs: clear to auscultation bilaterally Heart: regular rate and rhythm Abdomen: soft, non-tender; bowel sounds normal; no masses,  no organomegaly Extremities: extremities normal, atraumatic, no cyanosis or edema Pulses: 2+ and symmetric Skin: Skin color, texture, turgor normal. No rashes or lesions Neurologic: Grossly normal Psych: Pleasant  EKG: Sinus bradycardia 59 - personally reviewed  ASSESSMENT: 1. Long-standing resistant hypertension-controlled for the past 7 years 2. Multiple medication side effects 3. OSA on CPAP 4. DM2  PLAN: 1.   Mr. Taul has had reasonable control of his blood pressure however his wife and him are concerned about side effects of the medications. Her main concern is  gingival hyperplasia is result of amlodipine and they wish to potentially get off that medication. In addition he's had significant fatigue and this is likely related to clonidine and/or his beta blocker. I told him that it may be challenging to get him off the medication and still maintain adequate blood pressure control. One option may be to consider a more potent ARB to replace the lisinopril. I recommended starting valsartan and 320 mg daily. Will discontinue lisinopril and decrease his amlodipine to 5 mg daily. If blood pressure remains well controlled we can consider other changes to try to eliminate the amlodipine. Another option would be switching his metoprolol over to a better blood pressure lowering beta blocker such as carvedilol or nebivolol.  Plan hypertension follow-up  in the clinic in 2 weeks and then with me in one month.  Pixie Casino, MD, [place]  Attending Cardiologist  Direct Dial: [phone]  Fax: [phone]  Website:  [email or web address] 09/23/2016, 5:56 PM

## 2016-09-23 NOTE — Patient Instructions (Signed)
Your physician has recommended you make the following change in your medication:  -- STOP lisinopril  -- START valsartan 320mg  once daily -- DECREASE amlodipine to 5mg  daily  Dr. Debara Pickett has requested that you schedule an appointment with one of our clinical pharmacists for a blood pressure check appointment within the next 2 weeks.  -- if you monitor your blood pressure (BP) at home, please bring your BP cuff and your BP readings with you to this appointment -- please check your BP no more than twice daily, after you have been sitting/resting for 5-10 minutes, at least 1 hour after taking your BP medications  Your physician recommends that you schedule a follow-up appointment in: [place] with Dr. Debara Pickett.

## 2016-09-29 ENCOUNTER — Ambulatory Visit (INDEPENDENT_AMBULATORY_CARE_PROVIDER_SITE_OTHER): Payer: Medicare HMO | Admitting: Podiatry

## 2016-09-29 ENCOUNTER — Encounter: Payer: Self-pay | Admitting: Podiatry

## 2016-09-29 DIAGNOSIS — M2040 Other hammer toe(s) (acquired), unspecified foot: Secondary | ICD-10-CM

## 2016-09-29 NOTE — Patient Instructions (Signed)
Normal post op wound healing. Suture removed right foot. Do daily dressing change with supply dispensed. Return in one week.

## 2016-09-29 NOTE — Progress Notes (Signed)
2 week post op following Hammer toe 5 with partial syndactily 4th web right, partial bone resection 5th left. Patient denies any major pain or discomfort. Incision sites all dry and well coapted.  Left foot 5th toe incision site healed well. Right foot sutures removed.  Instructed to cleanse with Iodine and place dry gauze on right. Home care supply dispensed. Return in one week.

## 2016-10-06 ENCOUNTER — Encounter: Payer: Medicare HMO | Admitting: Podiatry

## 2016-10-07 DIAGNOSIS — Z8673 Personal history of transient ischemic attack (TIA), and cerebral infarction without residual deficits: Secondary | ICD-10-CM

## 2016-10-07 DIAGNOSIS — H26491 Other secondary cataract, right eye: Secondary | ICD-10-CM | POA: Diagnosis not present

## 2016-10-07 HISTORY — DX: Personal history of transient ischemic attack (TIA), and cerebral infarction without residual deficits: Z86.73

## 2016-10-10 ENCOUNTER — Ambulatory Visit (INDEPENDENT_AMBULATORY_CARE_PROVIDER_SITE_OTHER): Payer: Medicare HMO | Admitting: Podiatry

## 2016-10-10 ENCOUNTER — Encounter: Payer: Self-pay | Admitting: Podiatry

## 2016-10-10 DIAGNOSIS — Z9889 Other specified postprocedural states: Secondary | ICD-10-CM

## 2016-10-10 NOTE — Progress Notes (Signed)
3 week post op following Hammer toe 5 with partial syndactily 4th web right, partial bone resection 5th left. Patient denies any major pain or discomfort. Incision sites all dry and healed well. May use cotton balls in between digits to aerate tight space. Return as needed.

## 2016-10-10 NOTE — Patient Instructions (Signed)
Post op follow up. All incision sites healed well. May use cotton ball in between digits to aerate. Return as needed.

## 2016-10-12 ENCOUNTER — Other Ambulatory Visit: Payer: Self-pay | Admitting: Physician Assistant

## 2016-10-17 ENCOUNTER — Ambulatory Visit: Payer: Medicare HMO | Admitting: Physician Assistant

## 2016-10-28 ENCOUNTER — Telehealth: Payer: Self-pay | Admitting: Internal Medicine

## 2016-10-28 DIAGNOSIS — R4781 Slurred speech: Secondary | ICD-10-CM | POA: Diagnosis not present

## 2016-10-28 DIAGNOSIS — R2681 Unsteadiness on feet: Secondary | ICD-10-CM | POA: Diagnosis not present

## 2016-10-28 DIAGNOSIS — G4733 Obstructive sleep apnea (adult) (pediatric): Secondary | ICD-10-CM | POA: Diagnosis not present

## 2016-10-28 DIAGNOSIS — G4730 Sleep apnea, unspecified: Secondary | ICD-10-CM | POA: Diagnosis not present

## 2016-10-28 DIAGNOSIS — I10 Essential (primary) hypertension: Secondary | ICD-10-CM | POA: Diagnosis not present

## 2016-10-28 DIAGNOSIS — F419 Anxiety disorder, unspecified: Secondary | ICD-10-CM | POA: Diagnosis not present

## 2016-10-28 DIAGNOSIS — E876 Hypokalemia: Secondary | ICD-10-CM | POA: Diagnosis not present

## 2016-10-28 DIAGNOSIS — R29810 Facial weakness: Secondary | ICD-10-CM | POA: Diagnosis not present

## 2016-10-28 DIAGNOSIS — Z720 Tobacco use: Secondary | ICD-10-CM | POA: Diagnosis not present

## 2016-10-28 DIAGNOSIS — K219 Gastro-esophageal reflux disease without esophagitis: Secondary | ICD-10-CM | POA: Diagnosis not present

## 2016-10-28 DIAGNOSIS — R001 Bradycardia, unspecified: Secondary | ICD-10-CM | POA: Diagnosis not present

## 2016-10-28 DIAGNOSIS — R471 Dysarthria and anarthria: Secondary | ICD-10-CM | POA: Diagnosis not present

## 2016-10-28 DIAGNOSIS — E119 Type 2 diabetes mellitus without complications: Secondary | ICD-10-CM | POA: Diagnosis not present

## 2016-10-28 DIAGNOSIS — R2689 Other abnormalities of gait and mobility: Secondary | ICD-10-CM | POA: Diagnosis not present

## 2016-10-28 DIAGNOSIS — F17210 Nicotine dependence, cigarettes, uncomplicated: Secondary | ICD-10-CM | POA: Diagnosis not present

## 2016-10-28 DIAGNOSIS — E785 Hyperlipidemia, unspecified: Secondary | ICD-10-CM | POA: Diagnosis not present

## 2016-10-28 DIAGNOSIS — I639 Cerebral infarction, unspecified: Secondary | ICD-10-CM | POA: Diagnosis not present

## 2016-10-28 DIAGNOSIS — R4701 Aphasia: Secondary | ICD-10-CM | POA: Diagnosis not present

## 2016-10-28 DIAGNOSIS — R202 Paresthesia of skin: Secondary | ICD-10-CM | POA: Diagnosis not present

## 2016-10-28 NOTE — Telephone Encounter (Signed)
Spoke with patient's wife. Patient is experiencing unilateral left sided numbness, tingling w/gait instability/imbalance and a little change w/speech. Symptoms started today. Advised patient's wife his symptoms are concerning for possible TIA/stroke and he should go to ED for evaluation. They live in [place] so advised nearest ED is appropriate for evaluation.

## 2016-10-28 NOTE — Telephone Encounter (Signed)
Thanks .. I agree.  Dr. H 

## 2016-10-28 NOTE — Telephone Encounter (Signed)
Patient wife calling, states that patient is experiencing feeling light-headed and his whole left side is numb and tingling. Patient is off-balance and almost fell today, numb feeling started today.

## 2016-10-30 DIAGNOSIS — I639 Cerebral infarction, unspecified: Secondary | ICD-10-CM | POA: Insufficient documentation

## 2016-11-04 ENCOUNTER — Ambulatory Visit: Payer: Medicare HMO | Admitting: Internal Medicine

## 2016-11-04 DIAGNOSIS — E119 Type 2 diabetes mellitus without complications: Secondary | ICD-10-CM | POA: Diagnosis not present

## 2016-11-04 DIAGNOSIS — Z7984 Long term (current) use of oral hypoglycemic drugs: Secondary | ICD-10-CM | POA: Diagnosis not present

## 2016-11-04 DIAGNOSIS — Z9989 Dependence on other enabling machines and devices: Secondary | ICD-10-CM | POA: Diagnosis not present

## 2016-11-04 DIAGNOSIS — I10 Essential (primary) hypertension: Secondary | ICD-10-CM | POA: Diagnosis not present

## 2016-11-04 DIAGNOSIS — R260 Ataxic gait: Secondary | ICD-10-CM | POA: Diagnosis not present

## 2016-11-04 DIAGNOSIS — Z79899 Other long term (current) drug therapy: Secondary | ICD-10-CM | POA: Diagnosis not present

## 2016-11-04 DIAGNOSIS — I639 Cerebral infarction, unspecified: Secondary | ICD-10-CM | POA: Diagnosis not present

## 2016-11-04 DIAGNOSIS — G4733 Obstructive sleep apnea (adult) (pediatric): Secondary | ICD-10-CM | POA: Diagnosis not present

## 2016-11-04 DIAGNOSIS — Z7982 Long term (current) use of aspirin: Secondary | ICD-10-CM | POA: Diagnosis not present

## 2016-11-04 DIAGNOSIS — I69393 Ataxia following cerebral infarction: Secondary | ICD-10-CM | POA: Diagnosis not present

## 2016-11-04 DIAGNOSIS — F17210 Nicotine dependence, cigarettes, uncomplicated: Secondary | ICD-10-CM | POA: Diagnosis not present

## 2016-11-07 ENCOUNTER — Other Ambulatory Visit: Payer: Self-pay | Admitting: *Deleted

## 2016-11-07 MED ORDER — TRAMADOL HCL 50 MG PO TABS: 50.0000 mg | ORAL_TABLET | Freq: Three times a day (TID) | ORAL | 0 refills | Status: DC | PRN

## 2016-11-18 DIAGNOSIS — I10 Essential (primary) hypertension: Secondary | ICD-10-CM | POA: Diagnosis not present

## 2016-11-18 DIAGNOSIS — N200 Calculus of kidney: Secondary | ICD-10-CM | POA: Diagnosis not present

## 2016-11-29 DIAGNOSIS — R030 Elevated blood-pressure reading, without diagnosis of hypertension: Secondary | ICD-10-CM | POA: Diagnosis not present

## 2016-12-19 DIAGNOSIS — R2689 Other abnormalities of gait and mobility: Secondary | ICD-10-CM | POA: Diagnosis not present

## 2016-12-19 DIAGNOSIS — R9431 Abnormal electrocardiogram [ECG] [EKG]: Secondary | ICD-10-CM | POA: Diagnosis not present

## 2016-12-19 DIAGNOSIS — I668 Occlusion and stenosis of other cerebral arteries: Secondary | ICD-10-CM | POA: Diagnosis not present

## 2016-12-19 DIAGNOSIS — I639 Cerebral infarction, unspecified: Secondary | ICD-10-CM | POA: Diagnosis not present

## 2016-12-19 DIAGNOSIS — R29818 Other symptoms and signs involving the nervous system: Secondary | ICD-10-CM | POA: Diagnosis not present

## 2016-12-19 DIAGNOSIS — R2990 Unspecified symptoms and signs involving the nervous system: Secondary | ICD-10-CM | POA: Diagnosis not present

## 2016-12-19 DIAGNOSIS — R9082 White matter disease, unspecified: Secondary | ICD-10-CM | POA: Diagnosis not present

## 2016-12-19 DIAGNOSIS — M6281 Muscle weakness (generalized): Secondary | ICD-10-CM | POA: Diagnosis not present

## 2016-12-19 DIAGNOSIS — R200 Anesthesia of skin: Secondary | ICD-10-CM | POA: Diagnosis not present

## 2016-12-19 DIAGNOSIS — I6781 Acute cerebrovascular insufficiency: Secondary | ICD-10-CM | POA: Diagnosis not present

## 2016-12-19 DIAGNOSIS — Z8673 Personal history of transient ischemic attack (TIA), and cerebral infarction without residual deficits: Secondary | ICD-10-CM | POA: Diagnosis not present

## 2016-12-20 DIAGNOSIS — R2990 Unspecified symptoms and signs involving the nervous system: Secondary | ICD-10-CM | POA: Diagnosis not present

## 2016-12-20 DIAGNOSIS — R200 Anesthesia of skin: Secondary | ICD-10-CM | POA: Diagnosis not present

## 2016-12-23 DIAGNOSIS — Z7902 Long term (current) use of antithrombotics/antiplatelets: Secondary | ICD-10-CM | POA: Diagnosis not present

## 2016-12-23 DIAGNOSIS — E119 Type 2 diabetes mellitus without complications: Secondary | ICD-10-CM | POA: Diagnosis not present

## 2016-12-23 DIAGNOSIS — Z7984 Long term (current) use of oral hypoglycemic drugs: Secondary | ICD-10-CM | POA: Diagnosis not present

## 2016-12-23 DIAGNOSIS — Z9989 Dependence on other enabling machines and devices: Secondary | ICD-10-CM | POA: Diagnosis not present

## 2016-12-23 DIAGNOSIS — G4733 Obstructive sleep apnea (adult) (pediatric): Secondary | ICD-10-CM | POA: Diagnosis not present

## 2016-12-23 DIAGNOSIS — Z5189 Encounter for other specified aftercare: Secondary | ICD-10-CM | POA: Diagnosis not present

## 2016-12-23 DIAGNOSIS — F17210 Nicotine dependence, cigarettes, uncomplicated: Secondary | ICD-10-CM | POA: Diagnosis not present

## 2016-12-23 DIAGNOSIS — Z7982 Long term (current) use of aspirin: Secondary | ICD-10-CM | POA: Diagnosis not present

## 2016-12-23 DIAGNOSIS — I639 Cerebral infarction, unspecified: Secondary | ICD-10-CM | POA: Diagnosis not present

## 2016-12-23 DIAGNOSIS — F17290 Nicotine dependence, other tobacco product, uncomplicated: Secondary | ICD-10-CM | POA: Diagnosis not present

## 2016-12-23 DIAGNOSIS — I10 Essential (primary) hypertension: Secondary | ICD-10-CM | POA: Diagnosis not present

## 2016-12-26 ENCOUNTER — Other Ambulatory Visit: Payer: Self-pay | Admitting: Physician Assistant

## 2017-01-13 DIAGNOSIS — F329 Major depressive disorder, single episode, unspecified: Secondary | ICD-10-CM | POA: Diagnosis not present

## 2017-01-13 DIAGNOSIS — N3941 Urge incontinence: Secondary | ICD-10-CM | POA: Diagnosis not present

## 2017-01-13 DIAGNOSIS — N3943 Post-void dribbling: Secondary | ICD-10-CM | POA: Diagnosis not present

## 2017-01-13 DIAGNOSIS — E876 Hypokalemia: Secondary | ICD-10-CM | POA: Diagnosis not present

## 2017-01-13 DIAGNOSIS — R3915 Urgency of urination: Secondary | ICD-10-CM | POA: Diagnosis not present

## 2017-01-13 DIAGNOSIS — I639 Cerebral infarction, unspecified: Secondary | ICD-10-CM | POA: Diagnosis not present

## 2017-01-13 DIAGNOSIS — R001 Bradycardia, unspecified: Secondary | ICD-10-CM | POA: Diagnosis not present

## 2017-01-13 DIAGNOSIS — N183 Chronic kidney disease, stage 3 (moderate): Secondary | ICD-10-CM | POA: Diagnosis not present

## 2017-01-13 DIAGNOSIS — R32 Unspecified urinary incontinence: Secondary | ICD-10-CM | POA: Diagnosis not present

## 2017-01-13 DIAGNOSIS — I129 Hypertensive chronic kidney disease with stage 1 through stage 4 chronic kidney disease, or unspecified chronic kidney disease: Secondary | ICD-10-CM | POA: Diagnosis not present

## 2017-01-13 DIAGNOSIS — Z01818 Encounter for other preprocedural examination: Secondary | ICD-10-CM | POA: Diagnosis not present

## 2017-01-13 DIAGNOSIS — R351 Nocturia: Secondary | ICD-10-CM | POA: Diagnosis not present

## 2017-01-13 DIAGNOSIS — R350 Frequency of micturition: Secondary | ICD-10-CM | POA: Diagnosis not present

## 2017-01-19 ENCOUNTER — Other Ambulatory Visit: Payer: Self-pay | Admitting: Physician Assistant

## 2017-02-01 ENCOUNTER — Telehealth: Payer: Self-pay | Admitting: Physician Assistant

## 2017-02-01 NOTE — Telephone Encounter (Signed)
Called pt and left a message for him to schedule a f/u appt with Iran Planas

## 2017-02-03 DIAGNOSIS — Z8673 Personal history of transient ischemic attack (TIA), and cerebral infarction without residual deficits: Secondary | ICD-10-CM | POA: Diagnosis not present

## 2017-02-03 DIAGNOSIS — R350 Frequency of micturition: Secondary | ICD-10-CM | POA: Diagnosis not present

## 2017-02-03 DIAGNOSIS — R828 Abnormal findings on cytological and histological examination of urine: Secondary | ICD-10-CM | POA: Diagnosis not present

## 2017-02-03 DIAGNOSIS — R3915 Urgency of urination: Secondary | ICD-10-CM | POA: Diagnosis not present

## 2017-02-03 DIAGNOSIS — N138 Other obstructive and reflux uropathy: Secondary | ICD-10-CM | POA: Diagnosis not present

## 2017-02-03 DIAGNOSIS — F17200 Nicotine dependence, unspecified, uncomplicated: Secondary | ICD-10-CM | POA: Diagnosis not present

## 2017-02-03 DIAGNOSIS — R351 Nocturia: Secondary | ICD-10-CM | POA: Diagnosis not present

## 2017-02-03 DIAGNOSIS — E118 Type 2 diabetes mellitus with unspecified complications: Secondary | ICD-10-CM | POA: Diagnosis not present

## 2017-02-03 DIAGNOSIS — N3941 Urge incontinence: Secondary | ICD-10-CM | POA: Diagnosis not present

## 2017-02-03 DIAGNOSIS — N401 Enlarged prostate with lower urinary tract symptoms: Secondary | ICD-10-CM | POA: Diagnosis not present

## 2017-02-03 DIAGNOSIS — F17210 Nicotine dependence, cigarettes, uncomplicated: Secondary | ICD-10-CM | POA: Diagnosis not present

## 2017-02-28 ENCOUNTER — Ambulatory Visit (INDEPENDENT_AMBULATORY_CARE_PROVIDER_SITE_OTHER): Payer: Medicare HMO | Admitting: Osteopathic Medicine

## 2017-02-28 ENCOUNTER — Encounter: Payer: Self-pay | Admitting: Osteopathic Medicine

## 2017-02-28 VITALS — BP 152/90 | HR 62 | Wt 217.0 lb

## 2017-02-28 DIAGNOSIS — I10 Essential (primary) hypertension: Secondary | ICD-10-CM

## 2017-02-28 DIAGNOSIS — L723 Sebaceous cyst: Secondary | ICD-10-CM | POA: Diagnosis not present

## 2017-02-28 DIAGNOSIS — L02416 Cutaneous abscess of left lower limb: Secondary | ICD-10-CM

## 2017-02-28 DIAGNOSIS — L03116 Cellulitis of left lower limb: Secondary | ICD-10-CM | POA: Diagnosis not present

## 2017-02-28 MED ORDER — CLINDAMYCIN HCL 300 MG PO CAPS: 300.0000 mg | ORAL_CAPSULE | Freq: Three times a day (TID) | ORAL | 0 refills | Status: DC

## 2017-02-28 NOTE — Progress Notes (Signed)
HPI: Benjamin Powell is a 71 y.o. male  who presents to [place] today, 02/28/17,  for chief complaint of:  Chief Complaint  Patient presents with  . Knee Pain    Redness and tenderness of skin on the left knee for about 3 days. Not sure if it drained or not, no joint pain or decreased mobility of the knee. No problems weightbearing, no swelling.   Past medical, surgical, social and family history reviewed: Patient Active Problem List   Diagnosis Date Noted  . GERD (gastroesophageal reflux disease) 04/15/2016  . Depression 06/19/2015  . Severe recurrent major depression with psychotic features, mood-incongruent ([place]) 05/08/2015  . Injury of right great toe 02/20/2015  . Hypogonadism in male 04/21/2014  . Degenerative disc disease, cervical 01/31/2014  . Osteoarthritis of left knee 01/31/2014  . Obstructive sleep apnea 08/05/2013  . Posterior vitreous detachment, left eye 01/18/2013  . Microalbuminuric diabetic nephropathy ([place]) 01/03/2013  . Ankle pain 01/13/2012  . Spondylosis of cervical region without myelopathy or radiculopathy 12/19/2011  . Lumbar degenerative disc disease 12/19/2011  . Hypertension 12/01/2011  . Type 2 diabetes mellitus ([place]) 12/01/2011   Past Surgical History:  Procedure Laterality Date  . [place]   left  . CHOLECYSTECTOMY  1990  . ERCP  1990  . Hammer Toe Repair Right 09/16/2016   RT #5  . Partial Excision Phalanx Left 09/16/2016   LT#5  . TONSILLECTOMY AND ADENOIDECTOMY  1998   and sinus for sleep apnea   Social History  Substance Use Topics  . Smoking status: Current Every Day Smoker    Packs/day: 0.30    Years: 50.00    Types: Cigarettes  . Smokeless tobacco: Never Used  . Alcohol use No   Family History  Problem Relation Age of Onset  . Stroke Brother   . Colitis Brother   . Pancreatic cancer Mother   . Colitis Sister      Current medication list and allergy/intolerance  information reviewed     Review of Systems:  Constitutional:  No  fever, no chills  HEENT: No  headache  Cardiac: No  chest pain  Respiratory:  No  shortness of breath  Musculoskeletal: No new myalgia/arthralgia  Skin: +Rash   Exam:  BP (!) 152/90   Pulse 62   Wt 217 lb (98.4 kg)   BMI 29.43 kg/m   Constitutional: VS see above. General Appearance: alert, well-developed, well-nourished, NAD  Neck: No masses, trachea midline.   Respiratory: Normal respiratory effort  Musculoskeletal: Gait normal. No clubbing/cyanosis of digits. Normal range of motion left knee, no effusion  Neurological: Normal balance/coordination. No tremor.   Skin: warm, dry. Medial to left knee: Erythematous patch with fluctuating area centrally, spot on skin where appears boil may have come to ahead and drained it   Psychiatric: Normal judgment/insight. Normal mood and affect. Oriented x3.      ASSESSMENT/PLAN:   Cellulitis and abscess of left leg - Plan: WOUND CULTURE, clindamycin (CLEOCIN) 300 MG capsule  Essential hypertension - We'll recheck at follow-up     PRE-OP DIAGNOSIS: Abscess and cellulitis of left knee POST-OP DIAGNOSIS: Same  PROCEDURE: incision and drainage of abscess Performing Physician: Sheppard Coil    PROCEDURE:   Patient informed consent obtained verbally after discussion of risks (including but not limited to pain, infection, bleeding, damage to surrounding tissues, incomplete evacuation/treatment of infection, recurrence)  and benefits (adequate treatment and diagnosis, relief of pain). All questions answered  prior to procedure.   A timeout protocol was performed prior to initiating the procedure.  The area was prepared and draped in the usual, sterile manner. The site was anesthetized with 5 cc 1% lidocaine with epinephrine. A linear incision along the local skin lines was made and the purulent material expressed. The abcess was explored thoroughly and sequestered  pockets were evacuated.  Bleeding was minimal.   Packing: iodoform   Followup: The patient tolerated the procedure well without complications. Standard post-procedure care is explained and return precautions are given, patient was provided with printed information & instructions.    Patient Instructions  Incision and Drainage, Care After Refer to this sheet in the next few weeks. These instructions provide you with information about caring for yourself after your procedure. Your health care provider may also give you more specific instructions. Your treatment has been planned according to current medical practices, but problems sometimes occur. Call your health care provider if you have any problems or questions after your procedure. What can I expect after the procedure? After the procedure, it is common to have:  Pain or discomfort around your incision site.  Drainage from your incision.  Follow these instructions at home:  Take over-the-counter and prescription medicines only as told by your health care provider.  If you were prescribed an antibiotic medicine, take it as told by your health care provider.Do not stop taking the antibiotic even if you start to feel better.  Followinstructions from your health care provider about: ? How to take care of your incision. ? When and how you should change your packing and bandage (dressing). Wash your hands with soap and water before you change your dressing. If soap and water are not available, use hand sanitizer. ? When you should remove your dressing.  Do not take baths, swim, or use a hot tub until your health care provider approves.  Keep all follow-up visits as told by your health care provider. This is important.  Check your incision area every day for signs of infection. Check for: ? More redness, swelling, or pain. ? More fluid or blood. ? Warmth. ? Pus or a bad smell. Contact a health care provider if:  Your cyst or  abscess returns.  You have a fever.  You have more redness, swelling, or pain around your incision.  You have more fluid or blood coming from your incision.  Your incision feels warm to the touch.  You have pus or a bad smell coming from your incision. Get help right away if:  You have severe pain or bleeding.  You cannot eat or drink without vomiting.  You have decreased urine output.  You become short of breath.  You have chest pain.  You cough up blood.  The area where the incision and drainage occurred becomes numb or it tingles. This information is not intended to replace advice given to you by your health care provider. Make sure you discuss any questions you have with your health care provider. Document Released: 07/18/2011 Document Revised: 09/25/2015 Document Reviewed: 02/13/2015 Elsevier Interactive Patient Education  2017 [employer].     Visit summary with medication list and pertinent instructions was printed for patient to review. All questions at time of visit were answered - patient instructed to contact office with any additional concerns. ER/RTC precautions were reviewed with the patient. Follow-up plan: Return in about 2 days (around 03/02/2017) for packing removal and wound check, sooner if needed .  Note: Total time  spent 25 minutes, greater than 50% of the visit was spent face-to-face counseling and coordinating care for the following: The encounter diagnosis was Cellulitis and abscess of left leg.Marland Kitchen

## 2017-02-28 NOTE — Patient Instructions (Signed)
Incision and Drainage, Care After  Refer to this sheet in the next few weeks. These instructions provide you with information about caring for yourself after your procedure. Your health care provider may also give you more specific instructions. Your treatment has been planned according to current medical practices, but problems sometimes occur. Call your health care provider if you have any problems or questions after your procedure.  What can I expect after the procedure?  After the procedure, it is common to have:  · Pain or discomfort around your incision site.  · Drainage from your incision.     Follow these instructions at home:  ·   · Take over-the-counter and prescription medicines only as told by your health care provider.  · If you were prescribed an antibiotic medicine, take it as told by your health care provider. Do not stop taking the antibiotic even if you start to feel better.  · Follow instructions from your health care provider about:  ? How to take care of your incision.  ? When and how you should change your packing and bandage (dressing). Wash your hands with soap and water before you change your dressing. If soap and water are not available, use hand sanitizer.  ? When you should remove your dressing.  · Do not take baths, swim, or use a hot tub until your health care provider approves.  · Keep all follow-up visits as told by your health care provider. This is important.  · Check your incision area every day for signs of infection. Check for:  ? More redness, swelling, or pain.  ? More fluid or blood.  ? Warmth.  ? Pus or a bad smell.  Contact a health care provider if:  · Your cyst or abscess returns.  · You have a fever.  · You have more redness, swelling, or pain around your incision.  · You have more fluid or blood coming from your incision.  · Your incision feels warm to the touch.  · You have pus or a bad smell coming from your incision.  Get help right away if:  · You have severe pain or  bleeding.  · You cannot eat or drink without vomiting.  · You have decreased urine output.  · You become short of breath.  · You have chest pain.  · You cough up blood.  · The area where the incision and drainage occurred becomes numb or it tingles.  This information is not intended to replace advice given to you by your health care provider. Make sure you discuss any questions you have with your health care provider.  Document Released: 07/18/2011 Document Revised: 09/25/2015 Document Reviewed: 02/13/2015  Elsevier Interactive Patient Education © 2017 [employer].   

## 2017-03-01 DIAGNOSIS — L03116 Cellulitis of left lower limb: Principal | ICD-10-CM

## 2017-03-01 DIAGNOSIS — L02416 Cutaneous abscess of left lower limb: Secondary | ICD-10-CM | POA: Insufficient documentation

## 2017-03-02 ENCOUNTER — Ambulatory Visit: Payer: Medicare HMO | Admitting: Osteopathic Medicine

## 2017-03-02 ENCOUNTER — Encounter: Payer: Self-pay | Admitting: Osteopathic Medicine

## 2017-03-02 VITALS — BP 110/69 | HR 77 | Wt 215.0 lb

## 2017-03-02 DIAGNOSIS — L02416 Cutaneous abscess of left lower limb: Secondary | ICD-10-CM

## 2017-03-02 DIAGNOSIS — L03116 Cellulitis of left lower limb: Principal | ICD-10-CM

## 2017-03-02 NOTE — Progress Notes (Signed)
HPI: Benjamin Powell is a 71 y.o. male  who presents to [facility] today, 03/02/17,  for chief complaint of:  Chief Complaint  Patient presents with  . Wound Check    Wound check - no concerns. S/p I&D L leg 2 days ago   Past medical history, surgical history, social history and family history reviewed.  Patient Active Problem List   Diagnosis Date Noted  . Cellulitis and abscess of left leg 03/01/2017  . GERD (gastroesophageal reflux disease) 04/15/2016  . Depression 06/19/2015  . Severe recurrent major depression with psychotic features, mood-incongruent ([place]) 05/08/2015  . Injury of right great toe 02/20/2015  . Hypogonadism in male 04/21/2014  . Degenerative disc disease, cervical 01/31/2014  . Osteoarthritis of left knee 01/31/2014  . Obstructive sleep apnea 08/05/2013  . Posterior vitreous detachment, left eye 01/18/2013  . Microalbuminuric diabetic nephropathy ([place]) 01/03/2013  . Ankle pain 01/13/2012  . Spondylosis of cervical region without myelopathy or radiculopathy 12/19/2011  . Lumbar degenerative disc disease 12/19/2011  . Hypertension 12/01/2011  . Type 2 diabetes mellitus ([place]) 12/01/2011    Current medication list and allergy/intolerance information reviewed.   Current Outpatient Prescriptions on File Prior to Visit  Medication Sig Dispense Refill  . AMBULATORY NON FORMULARY MEDICATION CPAP Device and supplies: 11 cm H2O with a Large size Fisher&Paykel Nasal Mask Eson mask and heated humidification. Use nightly for OSA. 1 Units 0  . amLODipine (NORVASC) 10 MG tablet Take 1 tablet (10 mg total) by mouth daily. 90 tablet 1  . aspirin 81 MG tablet Take 81 mg by mouth daily.    . chlorthalidone (HYGROTEN) 100 MG tablet Take 1 tablet (100 mg total) by mouth daily. Replaces Hydrochlorothiazide for BP. 90 tablet 1  . clindamycin (CLEOCIN) 300 MG capsule Take 1 capsule (300 mg total) by mouth 3 (three) times daily. 21 capsule 0   . cloNIDine (CATAPRES) 0.3 MG tablet Take 1 tablet (0.3 mg total) by mouth 2 (two) times daily. Patient needs to schedule a follow up appointment with PCP before more refills. 60 tablet 0  . glipiZIDE (GLIPIZIDE XL) 5 MG 24 hr tablet Take 1 tablet (5 mg total) by mouth daily with breakfast. 90 tablet 1  . metFORMIN (GLUCOPHAGE) 1000 MG tablet Take 1 tablet (1,000 mg total) by mouth 2 (two) times daily with a meal. 180 tablet 1  . metoprolol (LOPRESSOR) 100 MG tablet Take 1 tablet (100 mg total) by mouth 2 (two) times daily. 180 tablet 1  . omeprazole (PRILOSEC) 40 MG capsule Take 1 capsule (40 mg total) by mouth daily. Take at dinnertime. 90 capsule 3  . traMADol (ULTRAM) 50 MG tablet Take 1 tablet (50 mg total) by mouth every 8 (eight) hours as needed. 60 tablet 0  . triamcinolone (KENALOG) 0.1 % paste Apply to cracked lips up to twice a day as needed. 5 g 1  . valsartan (DIOVAN) 320 MG tablet Take 1 tablet (320 mg total) by mouth daily. 30 tablet 5  . venlafaxine XR (EFFEXOR-XR) 150 MG 24 hr capsule TAKE 1 CAPSULE (150 MG TOTAL) BY MOUTH DAILY WITH BREAKFAST. 90 capsule 1   No current facility-administered medications on file prior to visit.    No Known Allergies    Review of Systems:  Constitutional: No fever/chills   Musculoskeletal: No new myalgia/arthralgia  Skin: No  New Rash   Exam:  BP 110/69   Pulse 77   Wt 215 lb (97.5 kg)  BMI 29.16 kg/m   Constitutional: VS see above. General Appearance: alert, well-developed, well-nourished, NAD   Respiratory: Normal respiratory effort.  Musculoskeletal: Gait normal.   Neurological: Normal balance/coordination. No tremor.  Skin: warm, dry. Packing removed, erythema is much better, no additional drainage.  Small Steri-Strip placed, incision was gaping a little bit    ASSESSMENT/PLAN:   Cellulitis and abscess of left leg - Packing removed, erythema is much better, no additional drainage.  Small Steri-Strip placed, incision  was gaping a little bit. RTC prn   Follow-up plan: Return if symptoms worsen or fail to improve or as directed by PCP for routine care .

## 2017-03-03 ENCOUNTER — Other Ambulatory Visit: Payer: Self-pay | Admitting: Physician Assistant

## 2017-03-03 ENCOUNTER — Telehealth: Payer: Self-pay | Admitting: Physician Assistant

## 2017-03-03 LAB — WOUND CULTURE
MICRO NUMBER: 81184807
SPECIMEN QUALITY: ADEQUATE

## 2017-03-03 MED ORDER — TRAMADOL HCL 50 MG PO TABS: 50.0000 mg | ORAL_TABLET | Freq: Three times a day (TID) | ORAL | 0 refills | Status: DC | PRN

## 2017-03-03 NOTE — Telephone Encounter (Signed)
Patient called requesting a refill for Tramadol. Thanks

## 2017-03-03 NOTE — Progress Notes (Signed)
Pt seen in office. Request refill of tramadol. Last refill 7/2 of 60.

## 2017-03-03 NOTE — Telephone Encounter (Signed)
[place] I printed and will be faxed. If continues to need refills we have to document pain contract and will need visit even if not taking daily for records.

## 2017-03-03 NOTE — Telephone Encounter (Signed)
Information discussed with pt. Pt verbalized understanding. 

## 2017-03-10 ENCOUNTER — Encounter: Payer: Self-pay | Admitting: Family Medicine

## 2017-03-10 ENCOUNTER — Telehealth: Payer: Self-pay | Admitting: Osteopathic Medicine

## 2017-03-10 ENCOUNTER — Ambulatory Visit (INDEPENDENT_AMBULATORY_CARE_PROVIDER_SITE_OTHER): Payer: Medicare HMO

## 2017-03-10 ENCOUNTER — Ambulatory Visit (INDEPENDENT_AMBULATORY_CARE_PROVIDER_SITE_OTHER): Payer: Medicare HMO | Admitting: Family Medicine

## 2017-03-10 VITALS — BP 127/72 | HR 58 | Ht 72.0 in | Wt 210.0 lb

## 2017-03-10 DIAGNOSIS — Z720 Tobacco use: Secondary | ICD-10-CM

## 2017-03-10 DIAGNOSIS — F17200 Nicotine dependence, unspecified, uncomplicated: Secondary | ICD-10-CM

## 2017-03-10 DIAGNOSIS — R059 Cough, unspecified: Secondary | ICD-10-CM

## 2017-03-10 DIAGNOSIS — R05 Cough: Secondary | ICD-10-CM | POA: Diagnosis not present

## 2017-03-10 DIAGNOSIS — R0602 Shortness of breath: Secondary | ICD-10-CM

## 2017-03-10 IMAGING — DX DG CHEST 2V
2 series · 2 of 2 positions shown · non-contrast
Comparison: None in PACs

CLINICAL DATA: Two weeks of cough without other symptoms. Current
smoker. History of diabetes.

EXAM:
CHEST  2 VIEW

[chest pa]
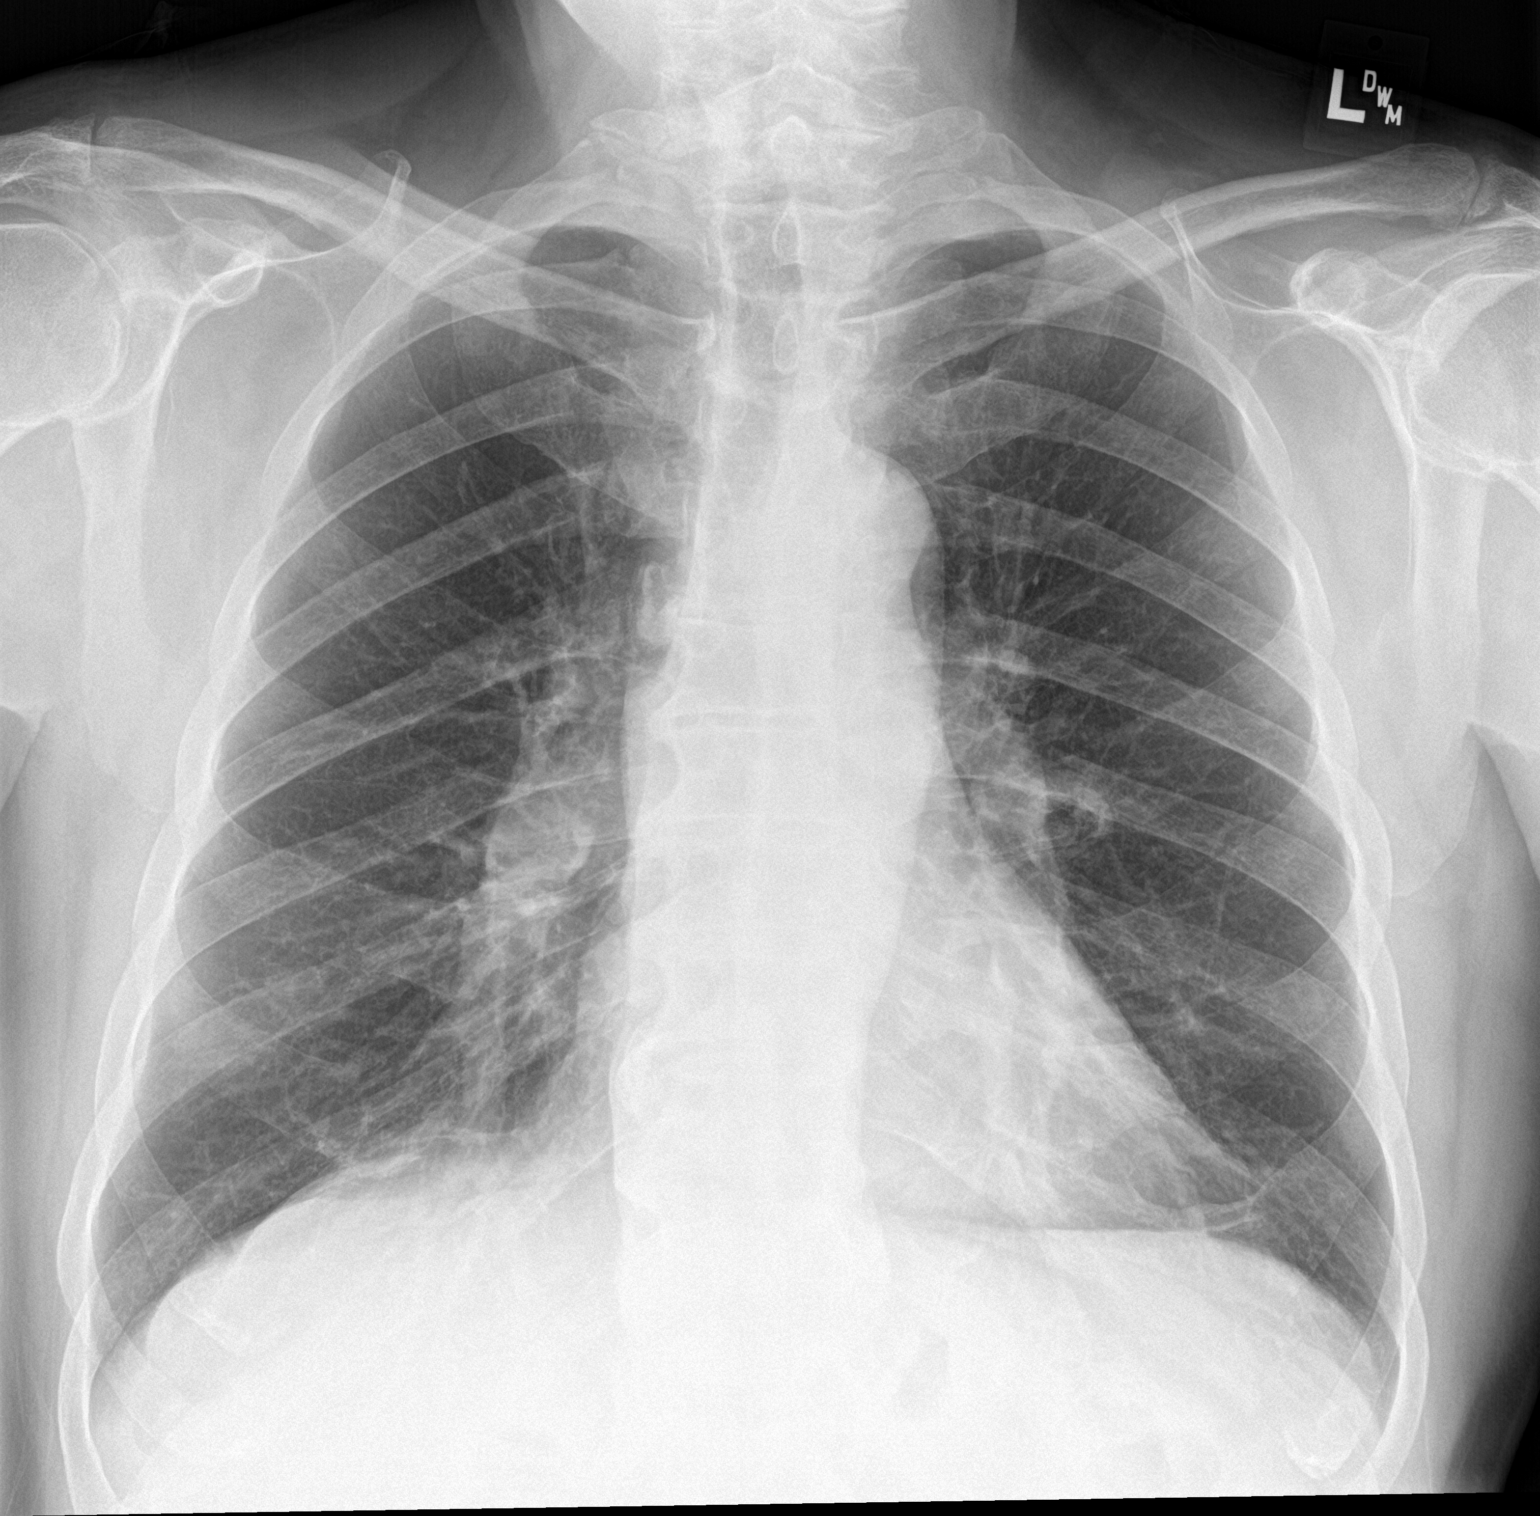

[chest lat]
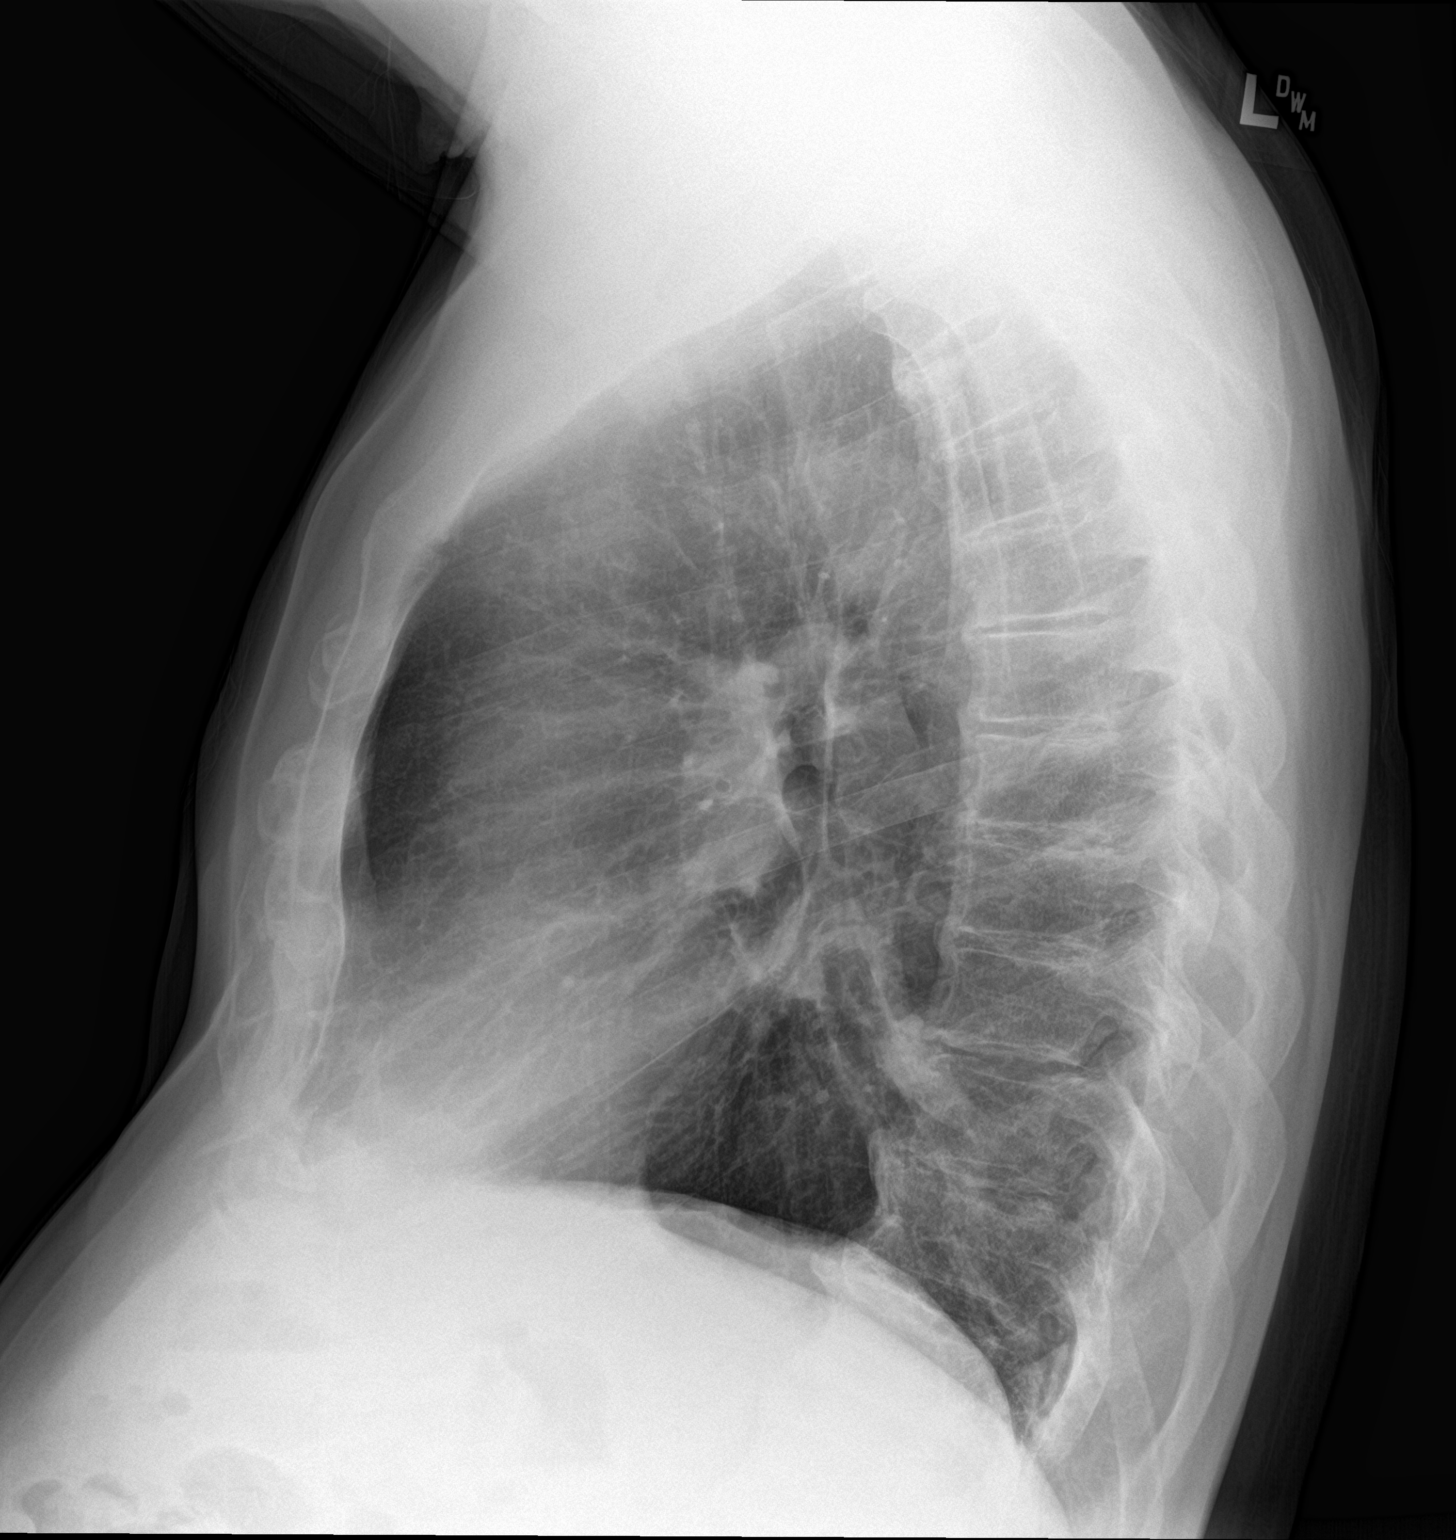

[2 of 2 positions shown; findings below may reference images not displayed]

FINDINGS: The lungs are adequately inflated. The interstitial markings are
coarse. There is no alveolar infiltrate or pleural effusion. The
heart and pulmonary vascularity are normal. The mediastinum is
normal in width. There are degenerative disc changes and
calcification of the anterior longitudinal ligament the thoracic
spine.
IMPRESSION: Chronic bronchitic-smoking related changes. No alveolar pneumonia
nor CHF.

## 2017-03-10 MED ORDER — OMEPRAZOLE 40 MG PO CPDR: 40.0000 mg | DELAYED_RELEASE_CAPSULE | Freq: Every day | ORAL | 1 refills | Status: DC

## 2017-03-10 MED ORDER — FLUCONAZOLE 100 MG PO TABS: ORAL_TABLET | ORAL | 0 refills | Status: DC

## 2017-03-10 MED ORDER — FLUCONAZOLE 150 MG PO TABS: 150.0000 mg | ORAL_TABLET | Freq: Once | ORAL | 1 refills | Status: DC

## 2017-03-10 NOTE — Progress Notes (Signed)
Subjective:    Patient ID: Benjamin Powell, male    DOB: [birth year], 71 y.o.   MRN: [id number]  HPI     71-year-old male comes in today complaining of a cough for approximately 2 weeks. He has a history of recent stroke back in July for which she was hospitalized. He describes his cough as mostly dry with some occasional sputum production. When he does notice sputum is mostly clear. He describes his cough as being worse at night. He denies any fevers, chills, sweats. He does have some postnasal drip first thing in the morning as well as a persistent sore throat for the last 2 weeks. He denies any bilateral ear pain or chest pain or pressure. He currently smokes 3-4 cigarettes per day and is interested in possibly quitting. He also was recently diagnosed with thrush in his mouth and is awaiting a prescription at the pharmacy. The pharmacist said that it was contraindicated with his Plavix and so hesitated to fill it and call us. Approval has been given to fill this medication.  Because of his long history of smoking his wife would like for him to have a chest x-ray performed.  Review of Systems  BP 127/72   Pulse (!) 58   Ht 6' (1.829 m)   Wt 210 lb (95.3 kg)   SpO2 97%   BMI 28.48 kg/m     No Known Allergies  Past Medical History:  Diagnosis Date  . Allergy    seasonal  . Anxiety   . Diabetes mellitus   . GERD (gastroesophageal reflux disease)   . Hypertension   . Sleep apnea     Past Surgical History:  Procedure Laterality Date  . [place]   left  . CHOLECYSTECTOMY  1990  . ERCP  1990  . Hammer Toe Repair Right 09/16/2016   RT #5  . Partial Excision Phalanx Left 09/16/2016   LT#5  . TONSILLECTOMY AND ADENOIDECTOMY  1998   and sinus for sleep apnea    Social History   Social History  . Marital status: Married    Spouse name: N/A  . Number of children: N/A  . Years of education: N/A   Occupational History  . Not on file.   Social History Main  Topics  . Smoking status: Current Every Day Smoker    Packs/day: 0.30    Years: 50.00    Types: Cigarettes  . Smokeless tobacco: Never Used  . Alcohol use No  . Drug use: No  . Sexual activity: Not on file   Other Topics Concern  . Not on file   Social History Narrative  . No narrative on file    Family History  Problem Relation Age of Onset  . Stroke Brother   . Colitis Brother   . Pancreatic cancer Mother   . Colitis Sister     Outpatient Encounter Prescriptions as of 03/10/2017  Medication Sig  . AMBULATORY NON FORMULARY MEDICATION CPAP Device and supplies: 11 cm H2O with a Large size Fisher&Paykel Nasal Mask Eson mask and heated humidification. Use nightly for OSA.  Marland Kitchen aspirin 325 MG tablet Take 325 mg by mouth.  Marland Kitchen atorvastatin (LIPITOR) 20 MG tablet Take 40 mg by mouth.  . chlorthalidone (HYGROTEN) 100 MG tablet Take 1 tablet (100 mg total) by mouth daily. Replaces Hydrochlorothiazide for BP.  Marland Kitchen Cholecalciferol (VITAMIN D-1000 MAX ST) 1000 units tablet Take 1,000 Units by mouth.  . cloNIDine (CATAPRES) 0.3 MG tablet  Take 1 tablet (0.3 mg total) by mouth 2 (two) times daily. Patient needs to schedule a follow up appointment with PCP before more refills.  . clopidogrel (PLAVIX) 75 MG tablet Take 75 mg by mouth.  . dutasteride (AVODART) 0.5 MG capsule   . escitalopram (LEXAPRO) 10 MG tablet Take 10 mg by mouth.  . fluconazole (DIFLUCAN) 100 MG tablet   . hydrochlorothiazide (HYDRODIURIL) 50 MG tablet Take 25 mg by mouth.  Marland Kitchen lisinopril (PRINIVIL,ZESTRIL) 10 MG tablet 10 mg.  . metFORMIN (GLUCOPHAGE) 500 MG tablet Do no take 6/24 evening, may resume 500mg  twice a day Mon 6/25  . Metoprolol Tartrate 75 MG TABS Take 75 mg by mouth.  . potassium chloride (K-DUR) 10 MEQ tablet Take 10 mEq by mouth daily.  . tamsulosin (FLOMAX) 0.4 MG CAPS capsule 0.4 mg.  . traMADol (ULTRAM) 50 MG tablet Take by mouth.  . triamcinolone (KENALOG) 0.1 % paste Apply to cracked lips up to twice a  day as needed.  . valsartan (DIOVAN) 320 MG tablet Take 1 tablet (320 mg total) by mouth daily.  . [DISCONTINUED] cloNIDine (CATAPRES) 0.3 MG tablet Take 0.3 mg by mouth.  . [DISCONTINUED] lisinopril (PRINIVIL,ZESTRIL) 10 MG tablet Take 10 mg by mouth.  . [DISCONTINUED] metFORMIN (GLUCOPHAGE) 500 MG tablet Take 500 mg by mouth 2 (two) times daily with a meal.  . [DISCONTINUED] potassium chloride (K-DUR,KLOR-CON) 10 MEQ tablet 10 mEq.  Marland Kitchen omeprazole (PRILOSEC) 40 MG capsule Take 1 capsule (40 mg total) by mouth daily. Take about 30 min before breakfast each day.  . [DISCONTINUED] amLODipine (NORVASC) 10 MG tablet Take 1 tablet (10 mg total) by mouth daily.  . [DISCONTINUED] aspirin 81 MG tablet Take 81 mg by mouth daily.  . [DISCONTINUED] clindamycin (CLEOCIN) 300 MG capsule Take 1 capsule (300 mg total) by mouth 3 (three) times daily.  . [DISCONTINUED] fluconazole (DIFLUCAN) 100 MG tablet 200 mg po x 1 dose on Day 1, then 100 mg daily for Days 2-7  . [DISCONTINUED] glipiZIDE (GLIPIZIDE XL) 5 MG 24 hr tablet Take 1 tablet (5 mg total) by mouth daily with breakfast.  . [DISCONTINUED] metFORMIN (GLUCOPHAGE) 1000 MG tablet Take 1 tablet (1,000 mg total) by mouth 2 (two) times daily with a meal.  . [DISCONTINUED] metoprolol (LOPRESSOR) 100 MG tablet Take 1 tablet (100 mg total) by mouth 2 (two) times daily.  . [DISCONTINUED] omeprazole (PRILOSEC) 40 MG capsule Take 1 capsule (40 mg total) by mouth daily. Take at dinnertime.  . [DISCONTINUED] traMADol (ULTRAM) 50 MG tablet Take 1 tablet (50 mg total) by mouth every 8 (eight) hours as needed.  . [DISCONTINUED] venlafaxine XR (EFFEXOR-XR) 150 MG 24 hr capsule TAKE 1 CAPSULE (150 MG TOTAL) BY MOUTH DAILY WITH BREAKFAST.   No facility-administered encounter medications on file as of 03/10/2017.         Objective:   Physical Exam  Constitutional: He is oriented to person, place, and time. He appears well-developed and well-nourished.  HENT:  Head:  Normocephalic and atraumatic.  Right Ear: External ear normal.  Left Ear: External ear normal.  Nose: Nose normal.  Mouth/Throat: Oropharynx is clear and moist.  TMs and canals are clear.   Eyes: Pupils are equal, round, and reactive to light. Conjunctivae and EOM are normal.  Neck: Neck supple. No thyromegaly present.  Cardiovascular: Normal rate and normal heart sounds.   Pulmonary/Chest: Effort normal and breath sounds normal.  Lymphadenopathy:    He has no cervical adenopathy.  Neurological: He  is alert and oriented to person, place, and time.  Skin: Skin is warm and dry.  Psychiatric: He has a normal mood and affect.          Assessment & Plan:  Cough-explained that it's most likely either postnasal drip or possibly GERD. Recommend a trial of an over-the-counter antihistamine. He Artie has some Allegra at home in addition to adding a PPI. Recommend he take these medications for the next 1-2 weeks. He should get some symptom improvement within the first 5 days. If he is not improving them please call us back. If he is improving then after 2 weeks he can discontinue one of the medications and see if he either gets better or symptoms return.  Thrush-I did not see any specific evidence of thrush but he has been getting some cracking on the sides of the mouth. We have let the pharmacy noted that it's okay to fill the Diflucan as he is only taking a single dose. Yes or impair the effectiveness of the Plavix temporarily. Discussed this with the patient today.  Tobacco abuse-we discussed options. His insurance is not currently cover Chantix and we discussed using interrupted 15 patch. Since he is only smoking 3-4 cigarettes per day he would be a great candidate to start with a 7 g patch or even the 14 if he would like. We also discussed using the lozenges or comes first thing in the morning while waiting for the patch to kick can and intermittently throughout the day if needed.

## 2017-03-10 NOTE — Telephone Encounter (Signed)
Pharmacist called and reports fluconazole has a drug interaction with plavix and lexapro in that it may reduce efficacy per pharmacist. (rx sent by Dr. Sheppard Coil this am). [place] for pharmacist to fill or please send another rx if necessary

## 2017-03-10 NOTE — Telephone Encounter (Signed)
Message left on pharm vm

## 2017-03-10 NOTE — Telephone Encounter (Signed)
Wife reports thrush/yeast infection after abx tretment for cellulitis, requests Rx. Advised if no better, RTC

## 2017-03-10 NOTE — Telephone Encounter (Signed)
Ok to fill. It is a one time dose.

## 2017-03-24 DIAGNOSIS — R42 Dizziness and giddiness: Secondary | ICD-10-CM | POA: Diagnosis not present

## 2017-03-24 DIAGNOSIS — E1169 Type 2 diabetes mellitus with other specified complication: Secondary | ICD-10-CM | POA: Diagnosis not present

## 2017-03-24 DIAGNOSIS — N138 Other obstructive and reflux uropathy: Secondary | ICD-10-CM | POA: Diagnosis not present

## 2017-03-24 DIAGNOSIS — E876 Hypokalemia: Secondary | ICD-10-CM | POA: Diagnosis not present

## 2017-03-24 DIAGNOSIS — E785 Hyperlipidemia, unspecified: Secondary | ICD-10-CM | POA: Diagnosis not present

## 2017-03-24 DIAGNOSIS — R739 Hyperglycemia, unspecified: Secondary | ICD-10-CM | POA: Diagnosis not present

## 2017-03-24 DIAGNOSIS — R4781 Slurred speech: Secondary | ICD-10-CM | POA: Diagnosis not present

## 2017-03-24 DIAGNOSIS — N400 Enlarged prostate without lower urinary tract symptoms: Secondary | ICD-10-CM | POA: Diagnosis not present

## 2017-03-24 DIAGNOSIS — E1100 Type 2 diabetes mellitus with hyperosmolarity without nonketotic hyperglycemic-hyperosmolar coma (NKHHC): Secondary | ICD-10-CM | POA: Diagnosis not present

## 2017-03-24 DIAGNOSIS — G4733 Obstructive sleep apnea (adult) (pediatric): Secondary | ICD-10-CM | POA: Diagnosis not present

## 2017-03-24 DIAGNOSIS — N401 Enlarged prostate with lower urinary tract symptoms: Secondary | ICD-10-CM | POA: Diagnosis not present

## 2017-03-24 DIAGNOSIS — F419 Anxiety disorder, unspecified: Secondary | ICD-10-CM | POA: Diagnosis not present

## 2017-03-24 DIAGNOSIS — I10 Essential (primary) hypertension: Secondary | ICD-10-CM | POA: Diagnosis not present

## 2017-03-24 DIAGNOSIS — I69398 Other sequelae of cerebral infarction: Secondary | ICD-10-CM | POA: Diagnosis not present

## 2017-03-24 DIAGNOSIS — Z794 Long term (current) use of insulin: Secondary | ICD-10-CM | POA: Diagnosis not present

## 2017-03-24 DIAGNOSIS — R001 Bradycardia, unspecified: Secondary | ICD-10-CM | POA: Diagnosis not present

## 2017-03-24 DIAGNOSIS — R270 Ataxia, unspecified: Secondary | ICD-10-CM | POA: Diagnosis not present

## 2017-03-24 DIAGNOSIS — R32 Unspecified urinary incontinence: Secondary | ICD-10-CM | POA: Diagnosis not present

## 2017-03-24 DIAGNOSIS — F329 Major depressive disorder, single episode, unspecified: Secondary | ICD-10-CM | POA: Diagnosis not present

## 2017-03-24 DIAGNOSIS — E119 Type 2 diabetes mellitus without complications: Secondary | ICD-10-CM | POA: Diagnosis not present

## 2017-03-24 DIAGNOSIS — E1165 Type 2 diabetes mellitus with hyperglycemia: Secondary | ICD-10-CM | POA: Diagnosis not present

## 2017-03-24 DIAGNOSIS — Z7984 Long term (current) use of oral hypoglycemic drugs: Secondary | ICD-10-CM | POA: Diagnosis not present

## 2017-03-24 DIAGNOSIS — E871 Hypo-osmolality and hyponatremia: Secondary | ICD-10-CM | POA: Diagnosis not present

## 2017-03-24 DIAGNOSIS — R410 Disorientation, unspecified: Secondary | ICD-10-CM | POA: Diagnosis not present

## 2017-03-24 DIAGNOSIS — K219 Gastro-esophageal reflux disease without esophagitis: Secondary | ICD-10-CM | POA: Diagnosis not present

## 2017-03-24 DIAGNOSIS — R260 Ataxic gait: Secondary | ICD-10-CM | POA: Diagnosis not present

## 2017-03-24 DIAGNOSIS — F17210 Nicotine dependence, cigarettes, uncomplicated: Secondary | ICD-10-CM | POA: Diagnosis not present

## 2017-03-24 DIAGNOSIS — R918 Other nonspecific abnormal finding of lung field: Secondary | ICD-10-CM | POA: Diagnosis not present

## 2017-03-24 DIAGNOSIS — N179 Acute kidney failure, unspecified: Secondary | ICD-10-CM | POA: Diagnosis not present

## 2017-03-24 DIAGNOSIS — Z66 Do not resuscitate: Secondary | ICD-10-CM | POA: Diagnosis not present

## 2017-03-24 DIAGNOSIS — R2689 Other abnormalities of gait and mobility: Secondary | ICD-10-CM | POA: Diagnosis not present

## 2017-03-24 DIAGNOSIS — Z8673 Personal history of transient ischemic attack (TIA), and cerebral infarction without residual deficits: Secondary | ICD-10-CM | POA: Diagnosis not present

## 2017-03-24 DIAGNOSIS — R5383 Other fatigue: Secondary | ICD-10-CM | POA: Diagnosis not present

## 2017-03-24 DIAGNOSIS — Z720 Tobacco use: Secondary | ICD-10-CM | POA: Diagnosis not present

## 2017-03-24 DIAGNOSIS — R269 Unspecified abnormalities of gait and mobility: Secondary | ICD-10-CM | POA: Diagnosis not present

## 2017-03-27 MED ORDER — INSULIN LISPRO 100 UNIT/ML ~~LOC~~ SOLN
10.00 | SUBCUTANEOUS | Status: DC
Start: 2017-03-27 — End: 2017-03-27

## 2017-03-27 MED ORDER — TAMSULOSIN HCL 0.4 MG PO CAPS
.40 mg | ORAL_CAPSULE | ORAL | Status: DC
Start: 2017-03-28 — End: 2017-03-27

## 2017-03-27 MED ORDER — DEXTROSE 10 % IV SOLN
125.00 mL | INTRAVENOUS | Status: DC
Start: ? — End: 2017-03-27

## 2017-03-27 MED ORDER — ROSUVASTATIN CALCIUM 5 MG PO TABS
10.00 mg | ORAL_TABLET | ORAL | Status: DC
Start: 2017-03-27 — End: 2017-03-27

## 2017-03-27 MED ORDER — CLOPIDOGREL BISULFATE 75 MG PO TABS
75.00 mg | ORAL_TABLET | ORAL | Status: DC
Start: 2017-03-28 — End: 2017-03-27

## 2017-03-27 MED ORDER — LISINOPRIL 5 MG PO TABS
10.00 mg | ORAL_TABLET | ORAL | Status: DC
Start: 2017-03-28 — End: 2017-03-27

## 2017-03-27 MED ORDER — CLONIDINE HCL 0.1 MG PO TABS
0.30 mg | ORAL_TABLET | ORAL | Status: DC
Start: 2017-03-27 — End: 2017-03-27

## 2017-03-27 MED ORDER — INSULIN LISPRO 100 UNIT/ML ~~LOC~~ SOLN
2.00 | SUBCUTANEOUS | Status: DC
Start: 2017-03-27 — End: 2017-03-27

## 2017-03-27 MED ORDER — NICOTINE 14 MG/24HR TD PT24
1.00 | MEDICATED_PATCH | TRANSDERMAL | Status: DC
Start: 2017-03-28 — End: 2017-03-27

## 2017-03-27 MED ORDER — DUTASTERIDE 0.5 MG PO CAPS
0.50 mg | ORAL_CAPSULE | ORAL | Status: DC
Start: 2017-03-28 — End: 2017-03-27

## 2017-03-27 MED ORDER — HEPARIN SODIUM (PORCINE) 5000 UNIT/ML IJ SOLN
5000.00 | INTRAMUSCULAR | Status: DC
Start: 2017-03-27 — End: 2017-03-27

## 2017-03-27 MED ORDER — LABETALOL HCL 5 MG/ML IV SOLN
10.00 mg | INTRAVENOUS | Status: DC
Start: ? — End: 2017-03-27

## 2017-03-27 MED ORDER — ASPIRIN 325 MG PO TABS
325.00 mg | ORAL_TABLET | ORAL | Status: DC
Start: 2017-03-28 — End: 2017-03-27

## 2017-03-27 MED ORDER — GENERIC EXTERNAL MEDICATION
24.00 | Status: DC
Start: 2017-03-27 — End: 2017-03-27

## 2017-03-27 MED ORDER — METOPROLOL TARTRATE 25 MG PO TABS
75.00 mg | ORAL_TABLET | ORAL | Status: DC
Start: 2017-03-27 — End: 2017-03-27

## 2017-03-27 MED ORDER — ESCITALOPRAM OXALATE 10 MG PO TABS
20.00 mg | ORAL_TABLET | ORAL | Status: DC
Start: 2017-03-28 — End: 2017-03-27

## 2017-04-07 DIAGNOSIS — I313 Pericardial effusion (noninflammatory): Secondary | ICD-10-CM | POA: Diagnosis not present

## 2017-04-07 DIAGNOSIS — Z808 Family history of malignant neoplasm of other organs or systems: Secondary | ICD-10-CM | POA: Diagnosis not present

## 2017-04-07 DIAGNOSIS — E119 Type 2 diabetes mellitus without complications: Secondary | ICD-10-CM | POA: Diagnosis not present

## 2017-04-07 DIAGNOSIS — I781 Nevus, non-neoplastic: Secondary | ICD-10-CM | POA: Diagnosis not present

## 2017-04-07 DIAGNOSIS — F17210 Nicotine dependence, cigarettes, uncomplicated: Secondary | ICD-10-CM | POA: Diagnosis not present

## 2017-04-07 DIAGNOSIS — L920 Granuloma annulare: Secondary | ICD-10-CM | POA: Diagnosis not present

## 2017-04-07 DIAGNOSIS — R911 Solitary pulmonary nodule: Secondary | ICD-10-CM | POA: Diagnosis not present

## 2017-04-07 DIAGNOSIS — L853 Xerosis cutis: Secondary | ICD-10-CM | POA: Diagnosis not present

## 2017-04-07 DIAGNOSIS — Z85828 Personal history of other malignant neoplasm of skin: Secondary | ICD-10-CM | POA: Diagnosis not present

## 2017-04-07 DIAGNOSIS — J432 Centrilobular emphysema: Secondary | ICD-10-CM | POA: Diagnosis not present

## 2017-04-07 DIAGNOSIS — L821 Other seborrheic keratosis: Secondary | ICD-10-CM | POA: Diagnosis not present

## 2017-05-05 DIAGNOSIS — L920 Granuloma annulare: Secondary | ICD-10-CM | POA: Diagnosis not present

## 2017-05-05 DIAGNOSIS — L853 Xerosis cutis: Secondary | ICD-10-CM | POA: Diagnosis not present

## 2017-05-07 ENCOUNTER — Other Ambulatory Visit: Payer: Self-pay | Admitting: Family Medicine

## 2017-05-19 DIAGNOSIS — K219 Gastro-esophageal reflux disease without esophagitis: Secondary | ICD-10-CM | POA: Diagnosis not present

## 2017-05-19 DIAGNOSIS — E785 Hyperlipidemia, unspecified: Secondary | ICD-10-CM | POA: Diagnosis not present

## 2017-05-19 DIAGNOSIS — Z7902 Long term (current) use of antithrombotics/antiplatelets: Secondary | ICD-10-CM | POA: Diagnosis not present

## 2017-05-19 DIAGNOSIS — Z87891 Personal history of nicotine dependence: Secondary | ICD-10-CM | POA: Diagnosis not present

## 2017-05-19 DIAGNOSIS — E119 Type 2 diabetes mellitus without complications: Secondary | ICD-10-CM | POA: Diagnosis not present

## 2017-05-19 DIAGNOSIS — Z794 Long term (current) use of insulin: Secondary | ICD-10-CM | POA: Diagnosis not present

## 2017-05-19 DIAGNOSIS — Z7901 Long term (current) use of anticoagulants: Secondary | ICD-10-CM | POA: Diagnosis not present

## 2017-05-19 DIAGNOSIS — G4733 Obstructive sleep apnea (adult) (pediatric): Secondary | ICD-10-CM | POA: Diagnosis not present

## 2017-05-19 DIAGNOSIS — E1165 Type 2 diabetes mellitus with hyperglycemia: Secondary | ICD-10-CM | POA: Diagnosis not present

## 2017-05-19 DIAGNOSIS — I69311 Memory deficit following cerebral infarction: Secondary | ICD-10-CM | POA: Diagnosis not present

## 2017-05-19 DIAGNOSIS — R413 Other amnesia: Secondary | ICD-10-CM | POA: Diagnosis not present

## 2017-05-19 DIAGNOSIS — Z8673 Personal history of transient ischemic attack (TIA), and cerebral infarction without residual deficits: Secondary | ICD-10-CM | POA: Diagnosis not present

## 2017-05-19 DIAGNOSIS — Z7982 Long term (current) use of aspirin: Secondary | ICD-10-CM | POA: Diagnosis not present

## 2017-05-19 DIAGNOSIS — F419 Anxiety disorder, unspecified: Secondary | ICD-10-CM | POA: Diagnosis not present

## 2017-05-19 DIAGNOSIS — K056 Periodontal disease, unspecified: Secondary | ICD-10-CM | POA: Diagnosis not present

## 2017-05-19 DIAGNOSIS — E1159 Type 2 diabetes mellitus with other circulatory complications: Secondary | ICD-10-CM | POA: Diagnosis not present

## 2017-05-24 ENCOUNTER — Telehealth: Payer: Self-pay | Admitting: Physician Assistant

## 2017-05-24 DIAGNOSIS — G4733 Obstructive sleep apnea (adult) (pediatric): Secondary | ICD-10-CM

## 2017-05-24 DIAGNOSIS — Z9989 Dependence on other enabling machines and devices: Principal | ICD-10-CM

## 2017-05-24 MED ORDER — AMBULATORY NON FORMULARY MEDICATION: 0 refills | Status: DC

## 2017-05-24 NOTE — Telephone Encounter (Signed)
Pt called, he needs a new CPAP supply order. He is out of supplies and company is requiring a new order stating the one they have has expired. Pt using [employer]. Pt is requesting regular size nasal mask vs the large.

## 2017-05-24 NOTE — Telephone Encounter (Signed)
Ok to send rx. For CPAP supplies.

## 2017-05-24 NOTE — Telephone Encounter (Signed)
Order printed

## 2017-05-26 ENCOUNTER — Telehealth: Payer: Self-pay | Admitting: Physician Assistant

## 2017-05-26 MED ORDER — TRAMADOL HCL 50 MG PO TABS: 50.0000 mg | ORAL_TABLET | Freq: Two times a day (BID) | ORAL | 2 refills | Status: DC | PRN

## 2017-05-26 NOTE — Telephone Encounter (Signed)
To PCP

## 2017-05-26 NOTE — Telephone Encounter (Signed)
Pt requesting Tramadol be called into [facility]

## 2017-05-26 NOTE — Telephone Encounter (Signed)
Done

## 2017-05-28 ENCOUNTER — Other Ambulatory Visit: Payer: Self-pay | Admitting: Physician Assistant

## 2017-05-31 DIAGNOSIS — G4733 Obstructive sleep apnea (adult) (pediatric): Secondary | ICD-10-CM | POA: Diagnosis not present

## 2017-06-02 DIAGNOSIS — Z713 Dietary counseling and surveillance: Secondary | ICD-10-CM | POA: Diagnosis not present

## 2017-06-02 DIAGNOSIS — E1165 Type 2 diabetes mellitus with hyperglycemia: Secondary | ICD-10-CM | POA: Diagnosis not present

## 2017-06-02 DIAGNOSIS — Z794 Long term (current) use of insulin: Secondary | ICD-10-CM | POA: Diagnosis not present

## 2017-06-30 DIAGNOSIS — N401 Enlarged prostate with lower urinary tract symptoms: Secondary | ICD-10-CM | POA: Diagnosis not present

## 2017-06-30 DIAGNOSIS — R310 Gross hematuria: Secondary | ICD-10-CM | POA: Diagnosis not present

## 2017-06-30 DIAGNOSIS — N138 Other obstructive and reflux uropathy: Secondary | ICD-10-CM | POA: Diagnosis not present

## 2017-07-07 DIAGNOSIS — N200 Calculus of kidney: Secondary | ICD-10-CM | POA: Diagnosis not present

## 2017-07-07 DIAGNOSIS — I340 Nonrheumatic mitral (valve) insufficiency: Secondary | ICD-10-CM | POA: Diagnosis not present

## 2017-07-07 DIAGNOSIS — R310 Gross hematuria: Secondary | ICD-10-CM | POA: Diagnosis not present

## 2017-07-07 DIAGNOSIS — N400 Enlarged prostate without lower urinary tract symptoms: Secondary | ICD-10-CM | POA: Diagnosis not present

## 2017-07-07 DIAGNOSIS — N281 Cyst of kidney, acquired: Secondary | ICD-10-CM | POA: Diagnosis not present

## 2017-07-07 DIAGNOSIS — I517 Cardiomegaly: Secondary | ICD-10-CM | POA: Diagnosis not present

## 2017-07-07 DIAGNOSIS — N401 Enlarged prostate with lower urinary tract symptoms: Secondary | ICD-10-CM | POA: Diagnosis not present

## 2017-07-07 DIAGNOSIS — Z8673 Personal history of transient ischemic attack (TIA), and cerebral infarction without residual deficits: Secondary | ICD-10-CM | POA: Diagnosis not present

## 2017-07-07 HISTORY — PX: TRANSTHORACIC ECHOCARDIOGRAM: SHX275

## 2017-07-21 ENCOUNTER — Encounter: Payer: Self-pay | Admitting: Sports Medicine

## 2017-07-21 ENCOUNTER — Ambulatory Visit (INDEPENDENT_AMBULATORY_CARE_PROVIDER_SITE_OTHER): Payer: Medicare HMO | Admitting: Sports Medicine

## 2017-07-21 DIAGNOSIS — M170 Bilateral primary osteoarthritis of knee: Secondary | ICD-10-CM

## 2017-07-21 NOTE — Progress Notes (Signed)
Subjective:    CC: Bilateral knee pain  HPI: This is a very pleasant 72 year old male with bilateral knee osteoarthritis, we have not treated him for several years.  Pain is moderate, persistent, localized at the medial joint lines, left worse than right with swelling on the left.  No mechanical symptoms, no trauma.  Unable to take NSAIDs due to historical peptic ulcer disease.  Oral analgesics have been ineffective.  I reviewed the past medical history, family history, social history, surgical history, and allergies today and no changes were needed.  Please see the problem list section below in epic for further details.  Past Medical History: Past Medical History:  Diagnosis Date  . Allergy    seasonal  . Anxiety   . Diabetes mellitus   . GERD (gastroesophageal reflux disease)   . Hypertension   . Sleep apnea    Past Surgical History: Past Surgical History:  Procedure Laterality Date  . [place]   left  . CHOLECYSTECTOMY  1990  . ERCP  1990  . Hammer Toe Repair Right 09/16/2016   RT #5  . Partial Excision Phalanx Left 09/16/2016   LT#5  . TONSILLECTOMY AND ADENOIDECTOMY  1998   and sinus for sleep apnea   Social History: Social History   Socioeconomic History  . Marital status: Married    Spouse name: None  . Number of children: None  . Years of education: None  . Highest education level: None  Social Needs  . Financial resource strain: None  . Food insecurity - worry: None  . Food insecurity - inability: None  . Transportation needs - medical: None  . Transportation needs - non-medical: None  Occupational History  . None  Tobacco Use  . Smoking status: Current Every Day Smoker    Packs/day: 0.30    Years: 50.00    Pack years: 15.00    Types: Cigarettes  . Smokeless tobacco: Never Used  Substance and Sexual Activity  . Alcohol use: No  . Drug use: No  . Sexual activity: None  Other Topics Concern  . None  Social History Narrative  . None     Family History: Family History  Problem Relation Age of Onset  . Stroke Brother   . Colitis Brother   . Pancreatic cancer Mother   . Colitis Sister    Allergies: No Known Allergies Medications: See med rec.  Review of Systems: No fevers, chills, night sweats, weight loss, chest pain, or shortness of breath.   Objective:    General: Well Developed, well nourished, and in no acute distress.  Neuro: Alert and oriented x3, extra-ocular muscles intact, sensation grossly intact.  HEENT: Normocephalic, atraumatic, pupils equal round reactive to light, neck supple, no masses, no lymphadenopathy, thyroid nonpalpable.  Skin: Warm and dry, no rashes. Cardiac: Regular rate and rhythm, no murmurs rubs or gallops, no lower extremity edema.  Respiratory: Clear to auscultation bilaterally. Not using accessory muscles, speaking in full sentences. Bilateral knees: Tenderness at both medial joint lines, palpable effusion with a fluid wave on the left ROM normal in flexion and extension and lower leg rotation. Ligaments with solid consistent endpoints including ACL, PCL, LCL, MCL. Negative Mcmurray's and provocative meniscal tests. Non painful patellar compression. Patellar and quadriceps tendons unremarkable. Hamstring and quadriceps strength is normal.  Procedure: Real-time Ultrasound Guided Injection of right knee Device: GE Logiq E  Verbal informed consent obtained.  Time-out conducted.  Noted no overlying erythema, induration, or other signs of  local infection.  Skin prepped in a sterile fashion.  Local anesthesia: Topical Ethyl chloride.  With sterile technique and under real time ultrasound guidance: 1 cc kenalog 40, 2 cc lidocaine, 2 cc bupivacaine injected easily Completed without difficulty  Pain immediately resolved suggesting accurate placement of the medication.  Advised to call if fevers/chills, erythema, induration, drainage, or persistent bleeding.  Images permanently stored  and available for review in the ultrasound unit.  Impression: Technically successful ultrasound guided injection.  Procedure: Real-time Ultrasound Guided injection of left knee Device: GE Logiq E  Verbal informed consent obtained.  Time-out conducted.  Noted no overlying erythema, induration, or other signs of local infection.  Skin prepped in a sterile fashion.  Local anesthesia: Topical Ethyl chloride.  With sterile technique and under real time ultrasound guidance: 1 cc Kenalog 40, 2 cc lidocaine, 2 cc bupivacaine injected easily Completed without difficulty  Pain immediately resolved suggesting accurate placement of the medication.  Advised to call if fevers/chills, erythema, induration, drainage, or persistent bleeding.  Images permanently stored and available for review in the ultrasound unit.  Impression: Technically successful ultrasound guided injection.  Impression and Recommendations:    Primary osteoarthritis of both knees Bilateral knee injection Return to see me in 1 month. We will proceed with Visco supplementation if no better. ___________________________________________ Gwen Her. Dianah Field, M.D., ABFM., CAQSM. Primary Care and [place] Instructor of [place] [facility] [employer]

## 2017-07-21 NOTE — Assessment & Plan Note (Addendum)
Bilateral knee injection Return to see me in 1 month. We will proceed with Visco supplementation if no better.

## 2017-07-28 DIAGNOSIS — Z7902 Long term (current) use of antithrombotics/antiplatelets: Secondary | ICD-10-CM | POA: Diagnosis not present

## 2017-07-28 DIAGNOSIS — I679 Cerebrovascular disease, unspecified: Secondary | ICD-10-CM | POA: Diagnosis not present

## 2017-07-28 DIAGNOSIS — I69311 Memory deficit following cerebral infarction: Secondary | ICD-10-CM | POA: Diagnosis not present

## 2017-07-28 DIAGNOSIS — I6381 Other cerebral infarction due to occlusion or stenosis of small artery: Secondary | ICD-10-CM | POA: Diagnosis not present

## 2017-07-28 DIAGNOSIS — G4733 Obstructive sleep apnea (adult) (pediatric): Secondary | ICD-10-CM | POA: Diagnosis not present

## 2017-07-28 DIAGNOSIS — Z7982 Long term (current) use of aspirin: Secondary | ICD-10-CM | POA: Diagnosis not present

## 2017-07-28 DIAGNOSIS — Z794 Long term (current) use of insulin: Secondary | ICD-10-CM | POA: Diagnosis not present

## 2017-07-28 DIAGNOSIS — Z79899 Other long term (current) drug therapy: Secondary | ICD-10-CM | POA: Diagnosis not present

## 2017-07-28 DIAGNOSIS — E1165 Type 2 diabetes mellitus with hyperglycemia: Secondary | ICD-10-CM | POA: Diagnosis not present

## 2017-07-28 DIAGNOSIS — E785 Hyperlipidemia, unspecified: Secondary | ICD-10-CM | POA: Diagnosis not present

## 2017-09-01 ENCOUNTER — Ambulatory Visit: Payer: Medicare HMO | Admitting: Sports Medicine

## 2017-09-08 ENCOUNTER — Ambulatory Visit: Payer: Medicare HMO | Admitting: Sports Medicine

## 2017-09-08 ENCOUNTER — Ambulatory Visit (INDEPENDENT_AMBULATORY_CARE_PROVIDER_SITE_OTHER): Payer: Medicare HMO

## 2017-09-08 ENCOUNTER — Encounter: Payer: Self-pay | Admitting: Physician Assistant

## 2017-09-08 ENCOUNTER — Ambulatory Visit (INDEPENDENT_AMBULATORY_CARE_PROVIDER_SITE_OTHER): Payer: Medicare HMO | Admitting: Physician Assistant

## 2017-09-08 VITALS — BP 132/80 | HR 63 | Wt 233.0 lb

## 2017-09-08 DIAGNOSIS — S6992XA Unspecified injury of left wrist, hand and finger(s), initial encounter: Secondary | ICD-10-CM

## 2017-09-08 DIAGNOSIS — W19XXXA Unspecified fall, initial encounter: Secondary | ICD-10-CM

## 2017-09-08 DIAGNOSIS — M7989 Other specified soft tissue disorders: Secondary | ICD-10-CM | POA: Diagnosis not present

## 2017-09-08 DIAGNOSIS — M25532 Pain in left wrist: Secondary | ICD-10-CM | POA: Diagnosis not present

## 2017-09-08 DIAGNOSIS — R296 Repeated falls: Secondary | ICD-10-CM | POA: Diagnosis not present

## 2017-09-08 DIAGNOSIS — S0990XA Unspecified injury of head, initial encounter: Secondary | ICD-10-CM | POA: Diagnosis not present

## 2017-09-08 DIAGNOSIS — M79642 Pain in left hand: Secondary | ICD-10-CM | POA: Diagnosis not present

## 2017-09-08 DIAGNOSIS — R51 Headache: Secondary | ICD-10-CM | POA: Diagnosis not present

## 2017-09-08 IMAGING — DX DG WRIST COMPLETE 3+V*L*
4 series · 4 of 4 positions shown · non-contrast
Comparison: None.

CLINICAL DATA: Pain following fall

EXAM:
LEFT WRIST - COMPLETE 3+ VIEW

[wrist pa]
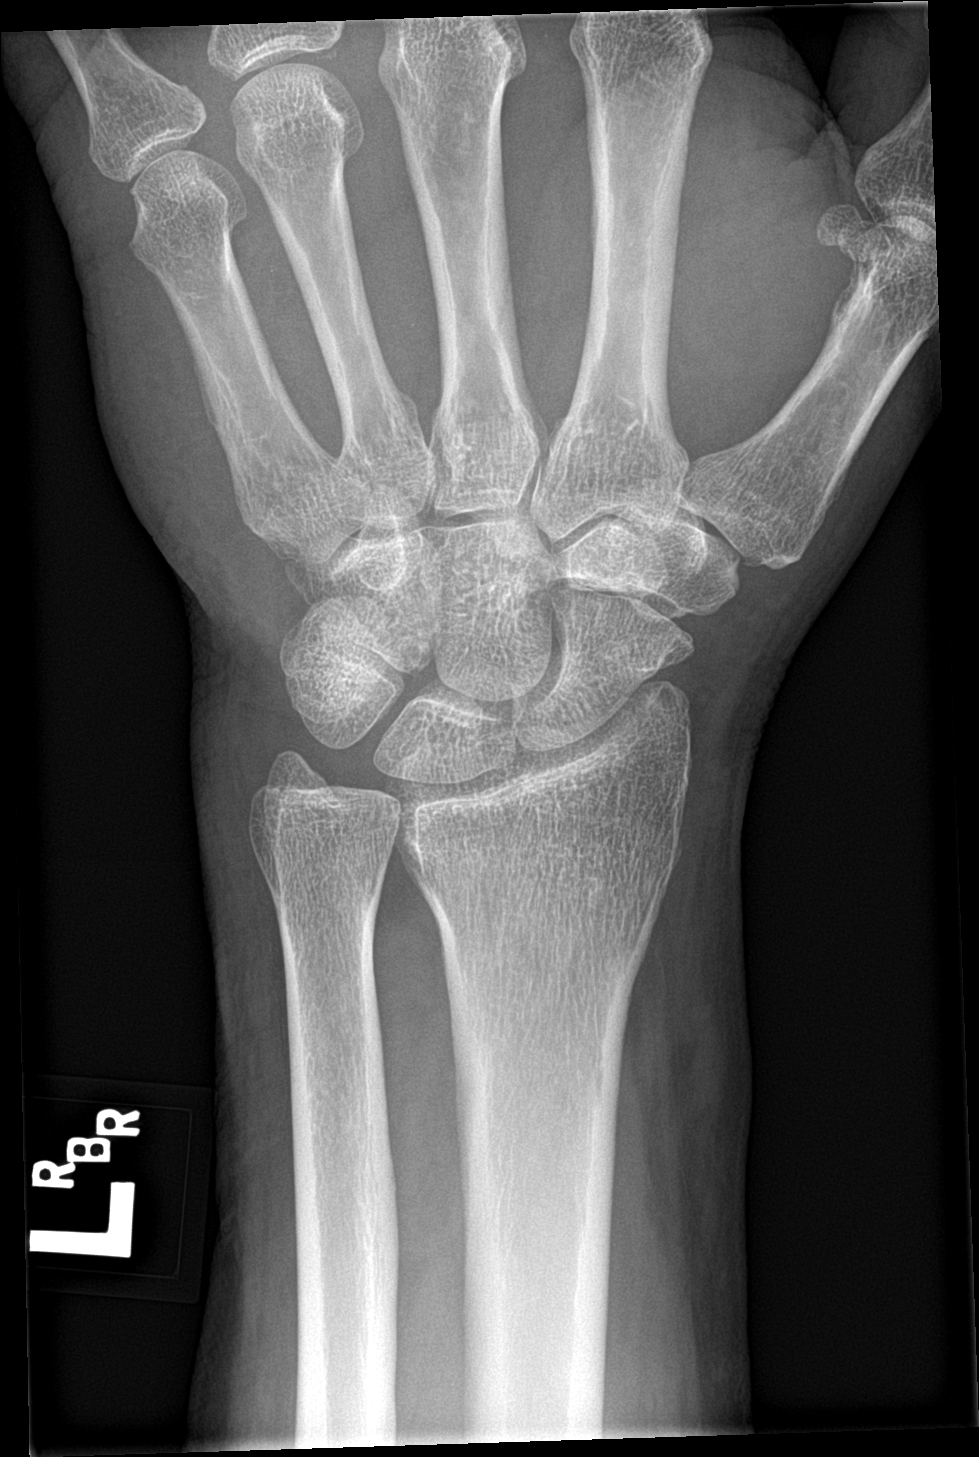

[wrist obl]
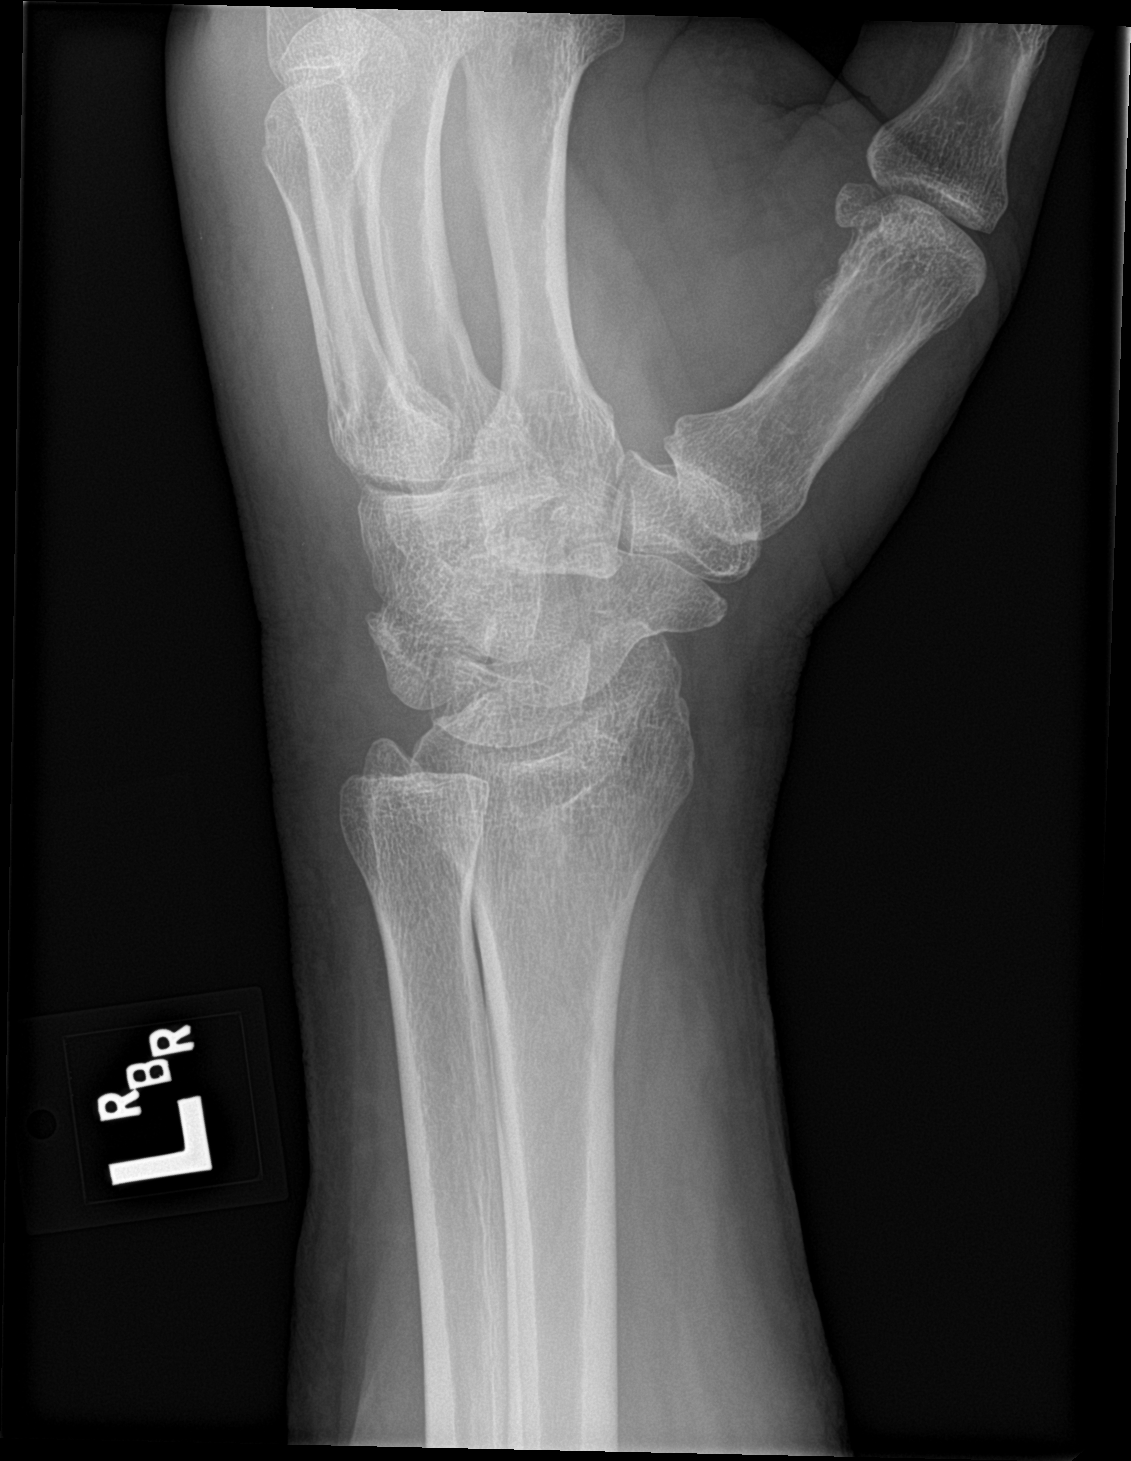

[wrist lat]
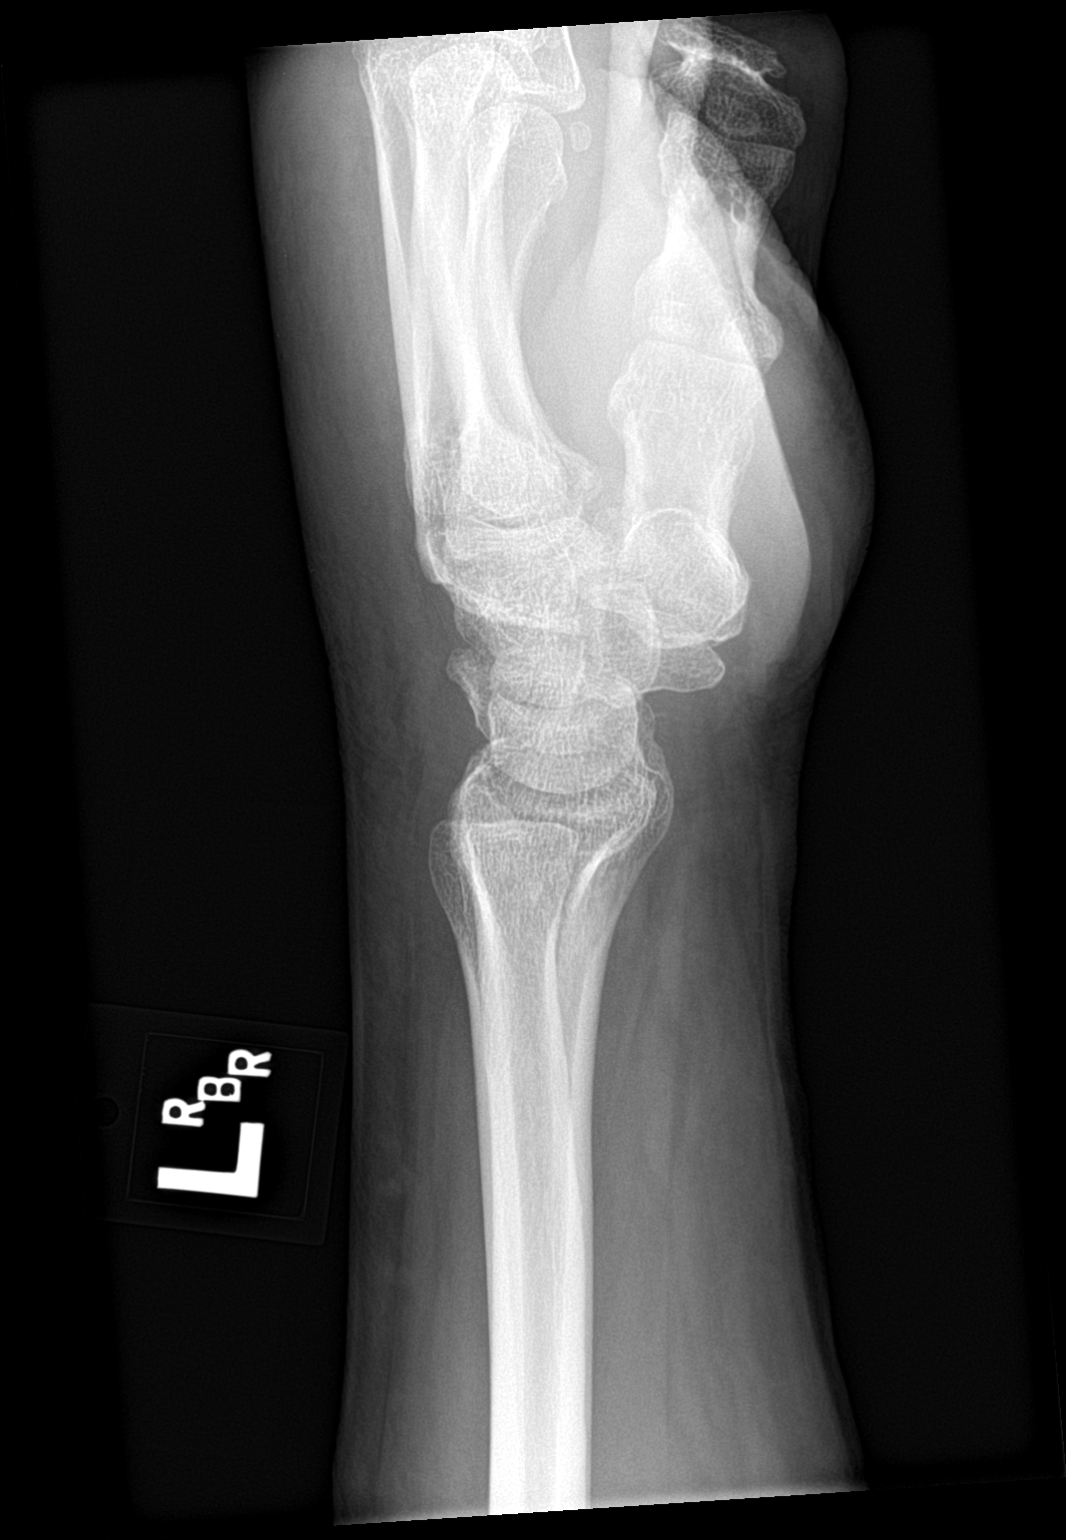

[wrist navicular]
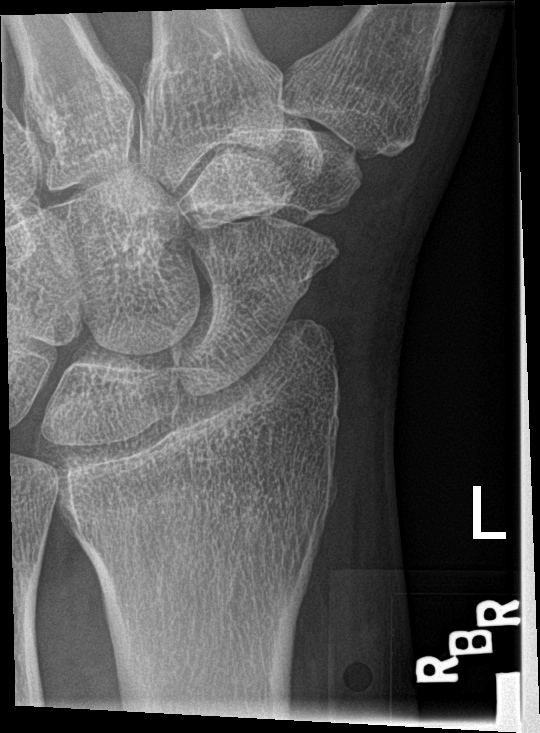

[4 of 4 positions shown; findings below may reference images not displayed]

FINDINGS: Frontal, oblique, lateral, and ulnar deviation scaphoid images were
obtained. There is no appreciable fracture or dislocation. Joint
spaces appear unremarkable. No erosive change or intra-articular
calcification.
IMPRESSION: No fracture or dislocation.  No appreciable underlying arthropathy.

## 2017-09-08 IMAGING — CT CT HEAD W/O CM
3 series · 15 of 47 positions shown, 18 images · non-contrast
Comparison: None.

CLINICAL DATA: Pain following fall

EXAM:
CT HEAD WITHOUT CONTRAST
TECHNIQUE: Contiguous axial images were obtained from the base of the skull
through the vertex without intravenous contrast.

[Series 2: head wo · axial · 0.44mm/px · z∈[-169,-39]mm · 9 of 32 slices shown, 12 images]
[im 3/32  brain]
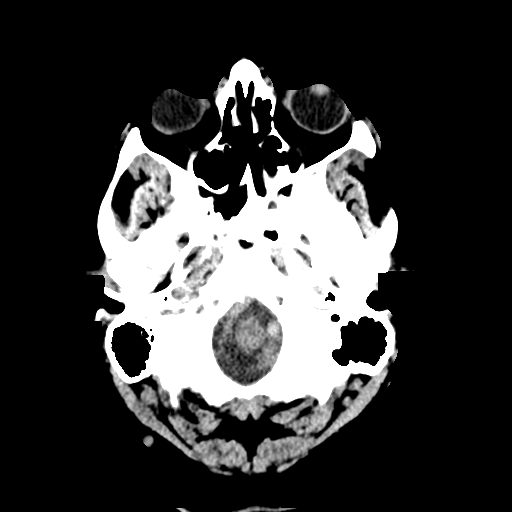
[im 3/32  bone]
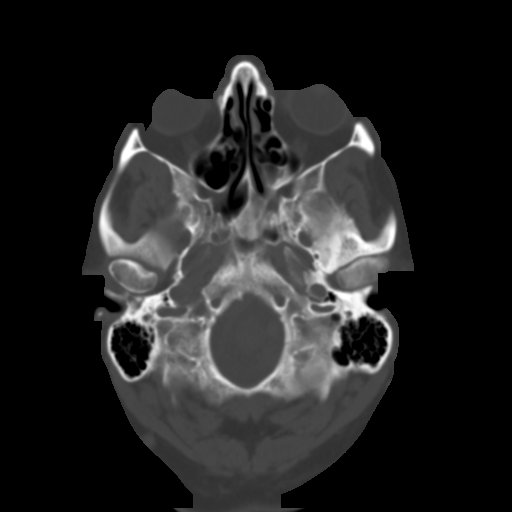
[im 6/32  brain]
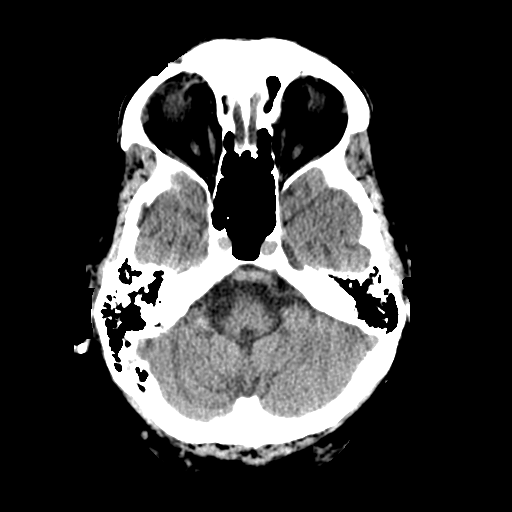
[im 9/32  brain]
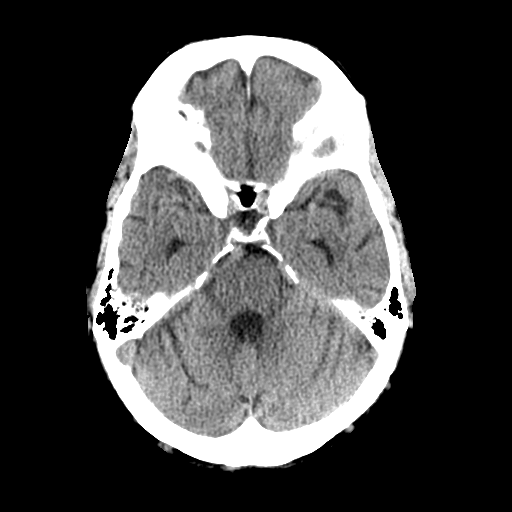
[im 12/32  brain]
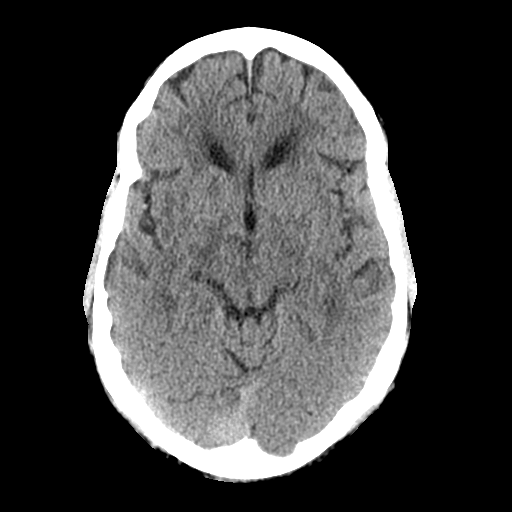
[im 17/32  brain]
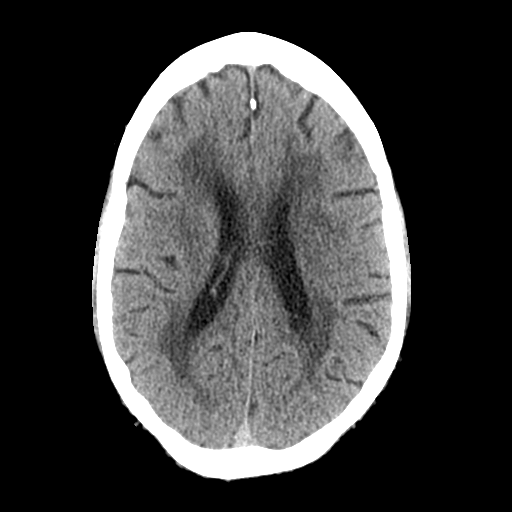
[im 17/32  bone]
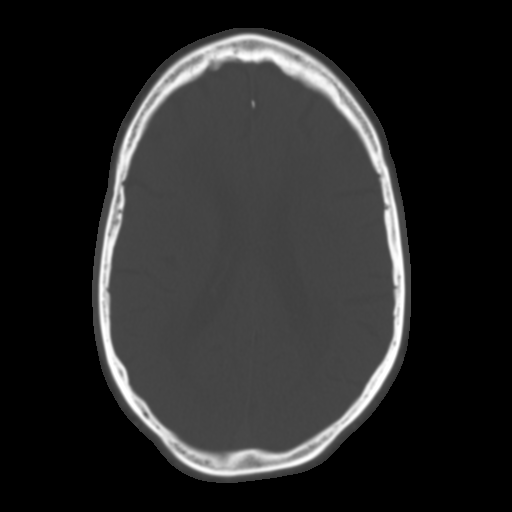
[im 20/32  brain]
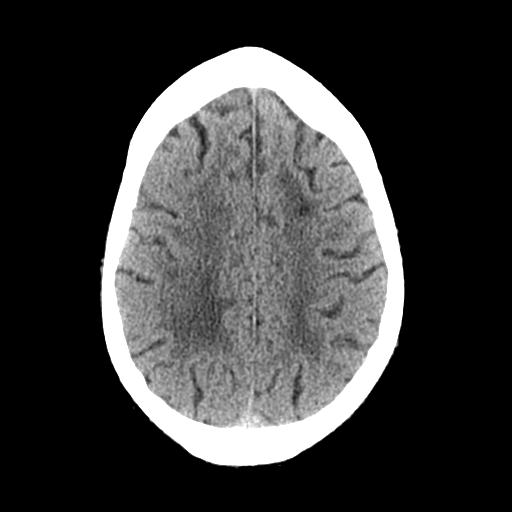
[im 23/32  brain]
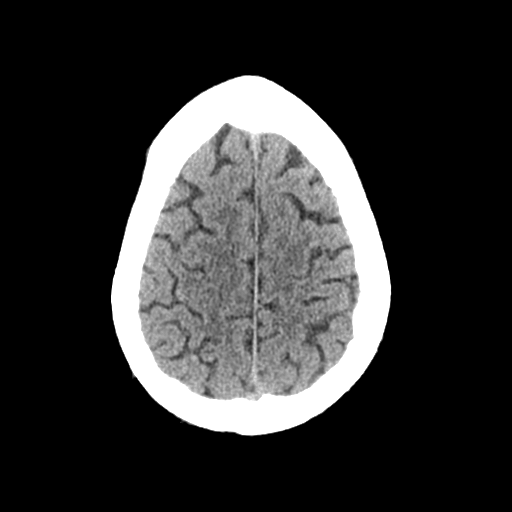
[im 26/32  brain]
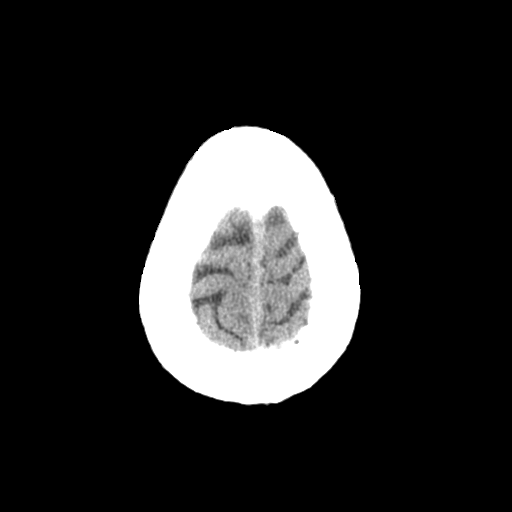
[im 29/32  brain]
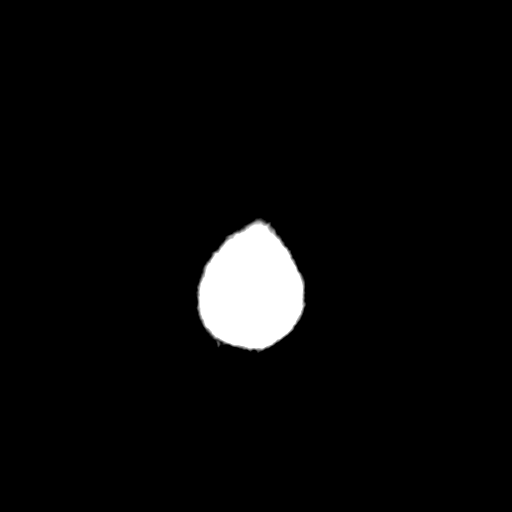
[im 29/32  bone]
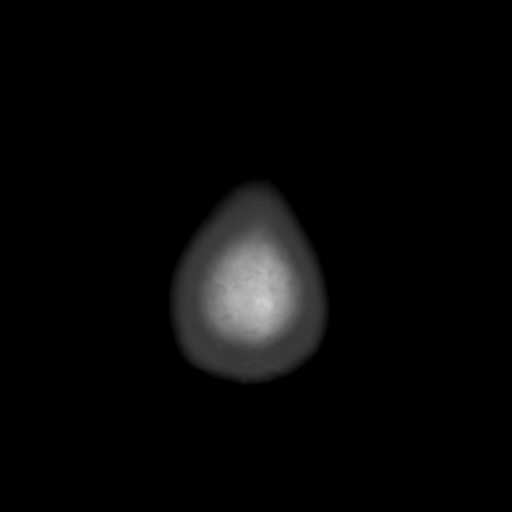

[Series 4: head wo coronal · coronal · 0.31mm/px · 3 of 73 slices shown]
[im 25/73  brain]
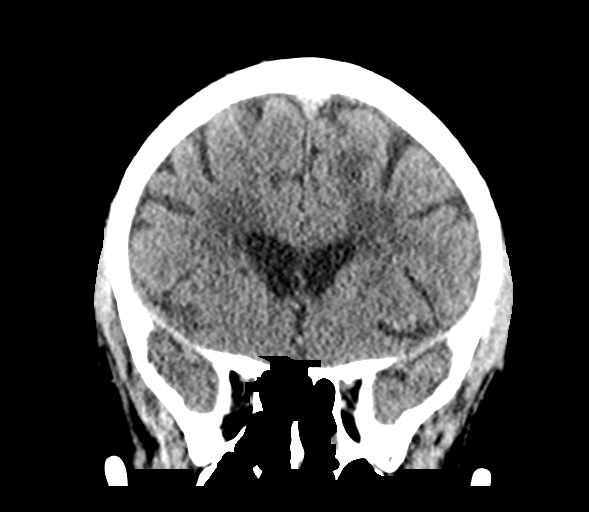
[im 33/73  brain]
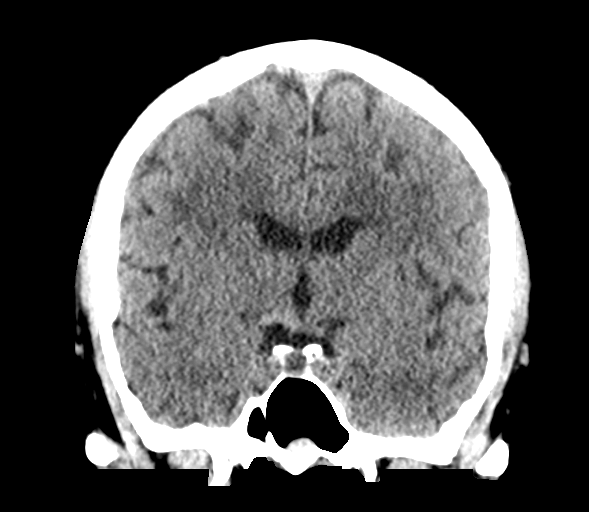
[im 41/73  brain]
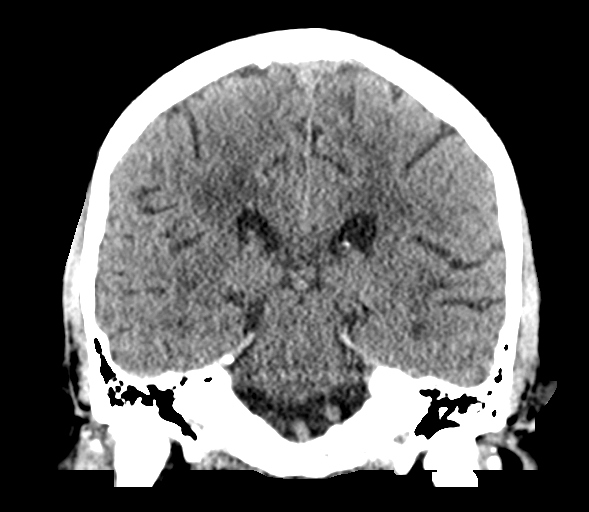

[Series 5: head wo sagittal · sagittal · 0.32mm/px · 3 of 55 slices shown]
[im 19/55  brain]
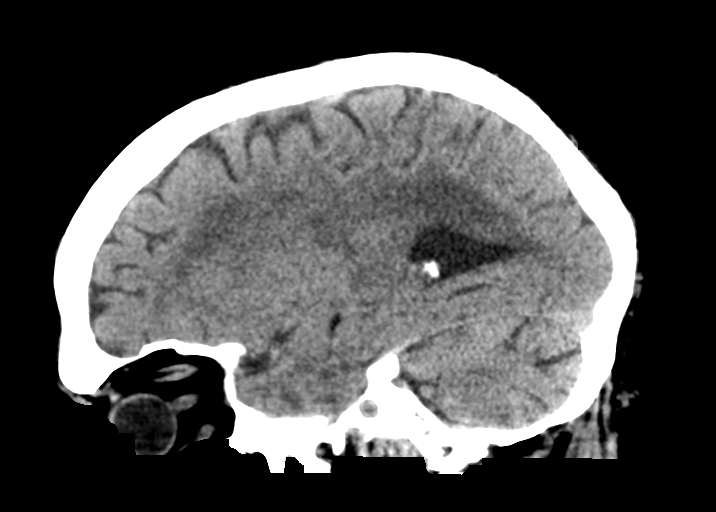
[im 28/55  brain]
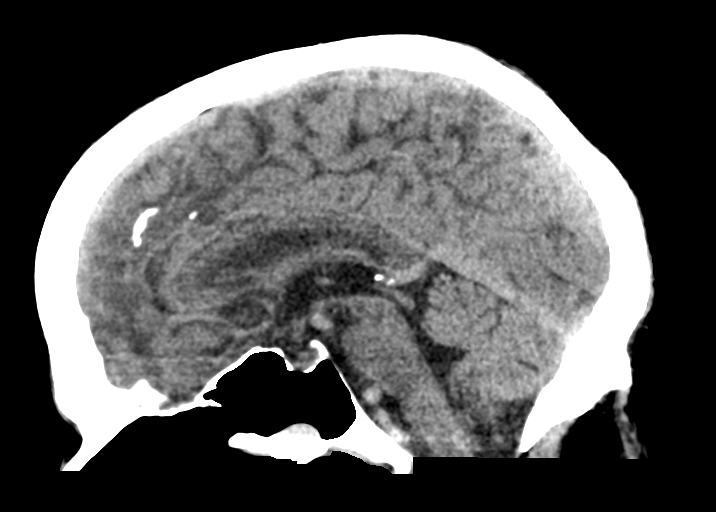
[im 37/55  brain]
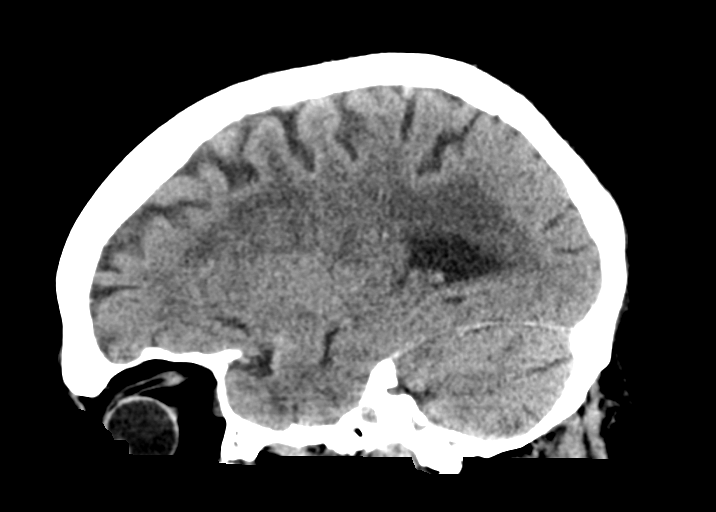

[15 of 47 positions shown; findings below may reference images not displayed]

FINDINGS: Brain: There is mild diffuse atrophy. There is no intracranial mass,
hemorrhage, extra-axial fluid collection, or midline shift. There is
patchy small vessel disease in the centra semiovale bilaterally.
There is small vessel disease in each internal capsule and anterior
limb external capsule. There is no acute appearing infarct evident.

Vascular: No hyperdense vessel. There is no appreciable vascular
calcification evident.

Skull: Bony calvarium appears intact.

Sinuses/Orbits: Evidence of prior antrostomies bilaterally. Mucosal
thickening in several ethmoid air cells with opacification in
several ethmoid air cells on the right. Other visualized paranasal
sinuses are clear. Frontal sinuses are aplastic. Visualized orbits
appear symmetric bilaterally.

Other: Visualized mastoid air cells are clear.
IMPRESSION: Mild atrophy with patchy supratentorial small vessel disease. No
mass or hemorrhage. No acute infarct. No extra-axial fluid
collection.

Areas of paranasal sinus disease noted.

## 2017-09-08 IMAGING — DX DG HAND COMPLETE 3+V*L*
3 series · 3 of 3 positions shown · non-contrast
Comparison: None.

CLINICAL DATA: Pain following fall

EXAM:
LEFT HAND - COMPLETE 3+ VIEW

[hand pa]
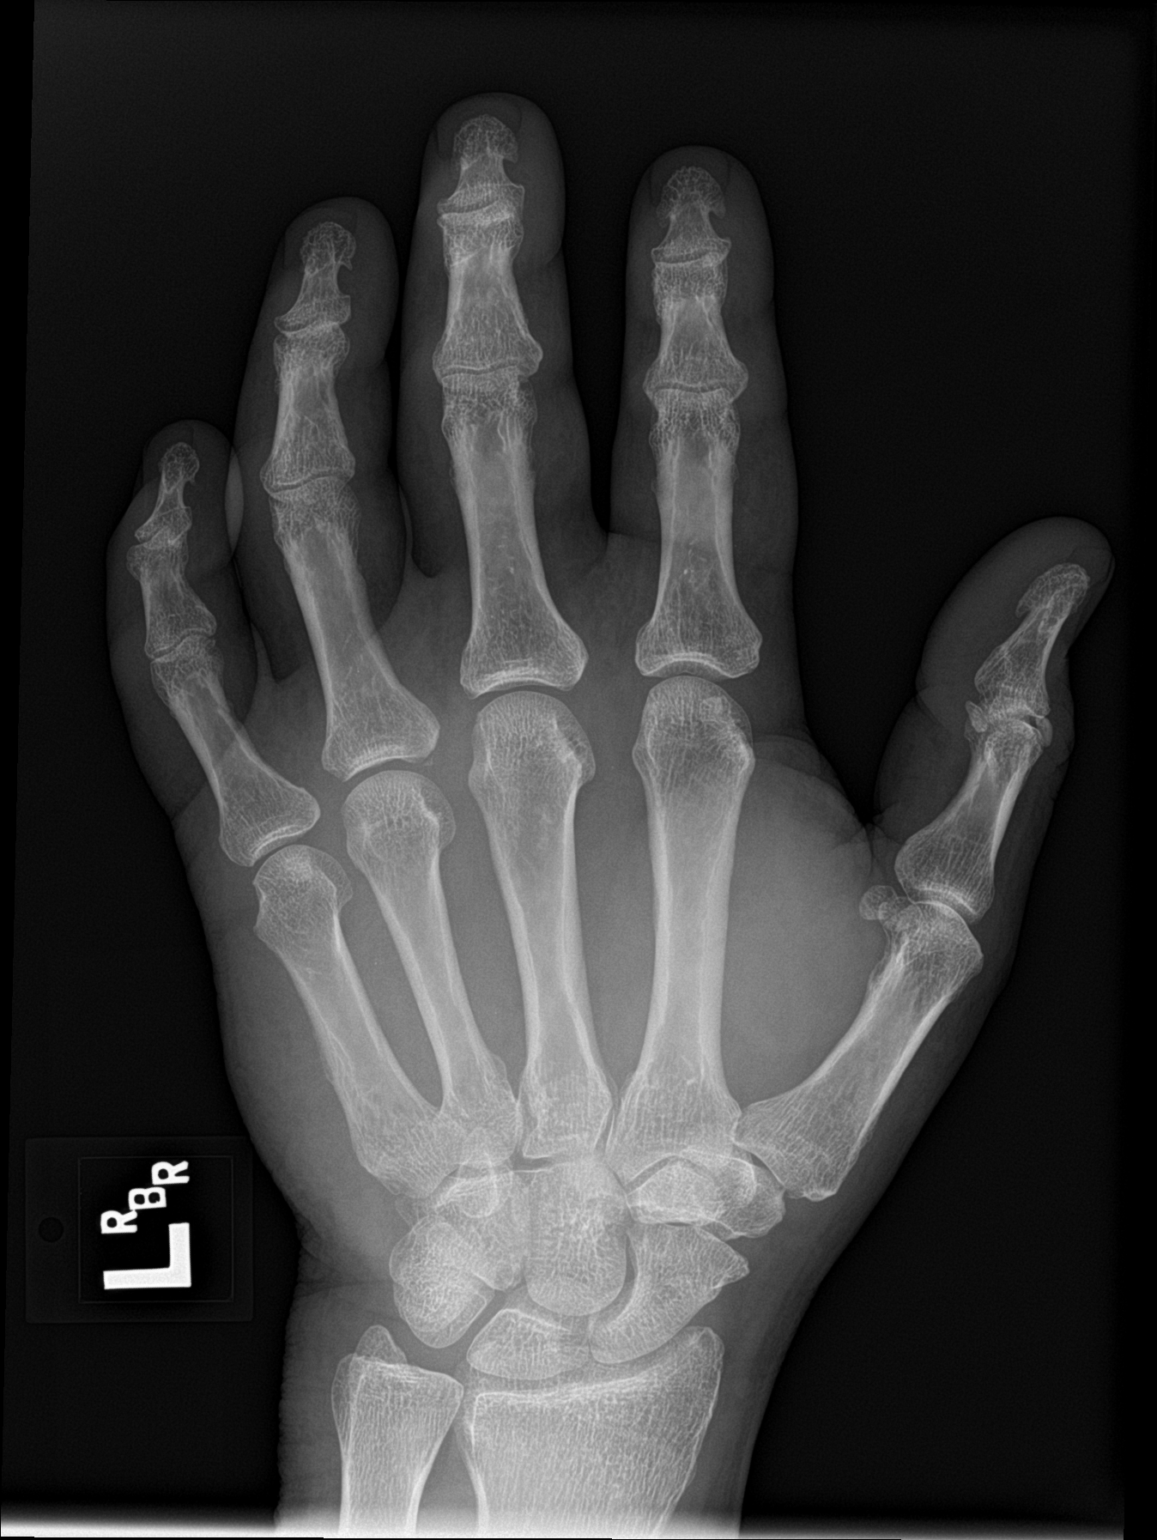

[hand obl]
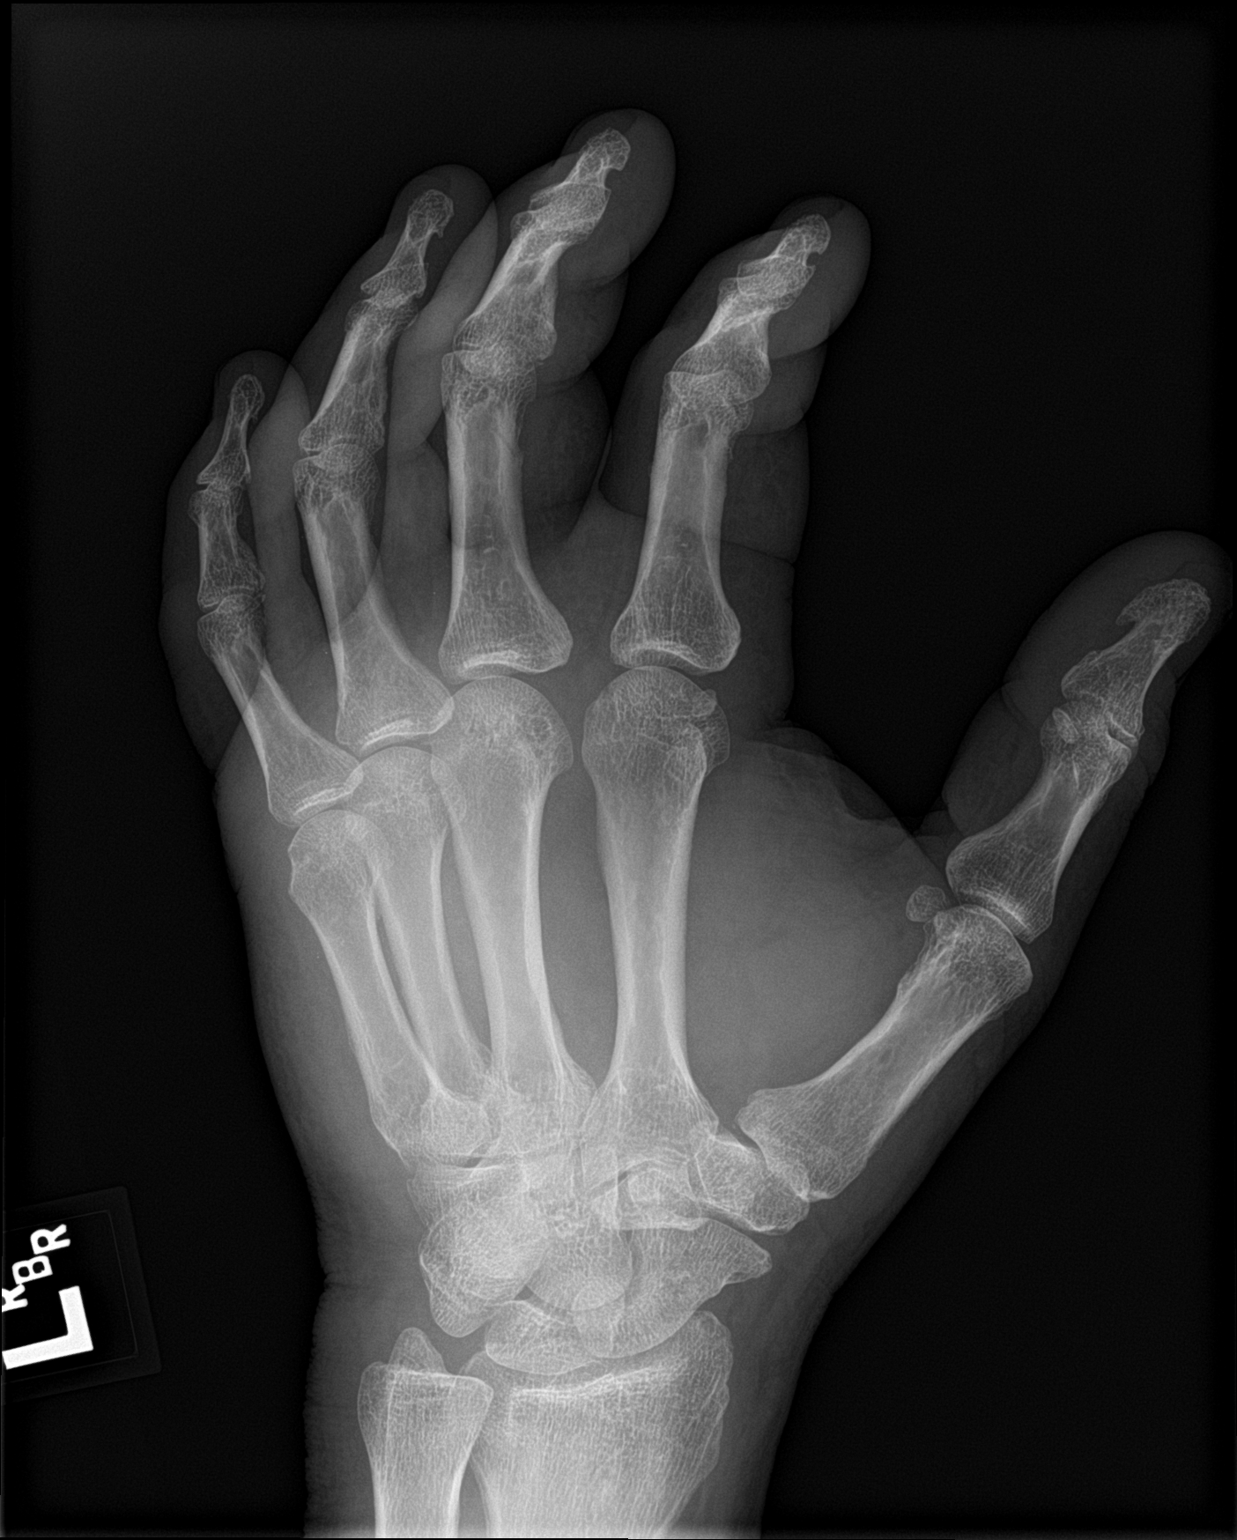

[hand lat]
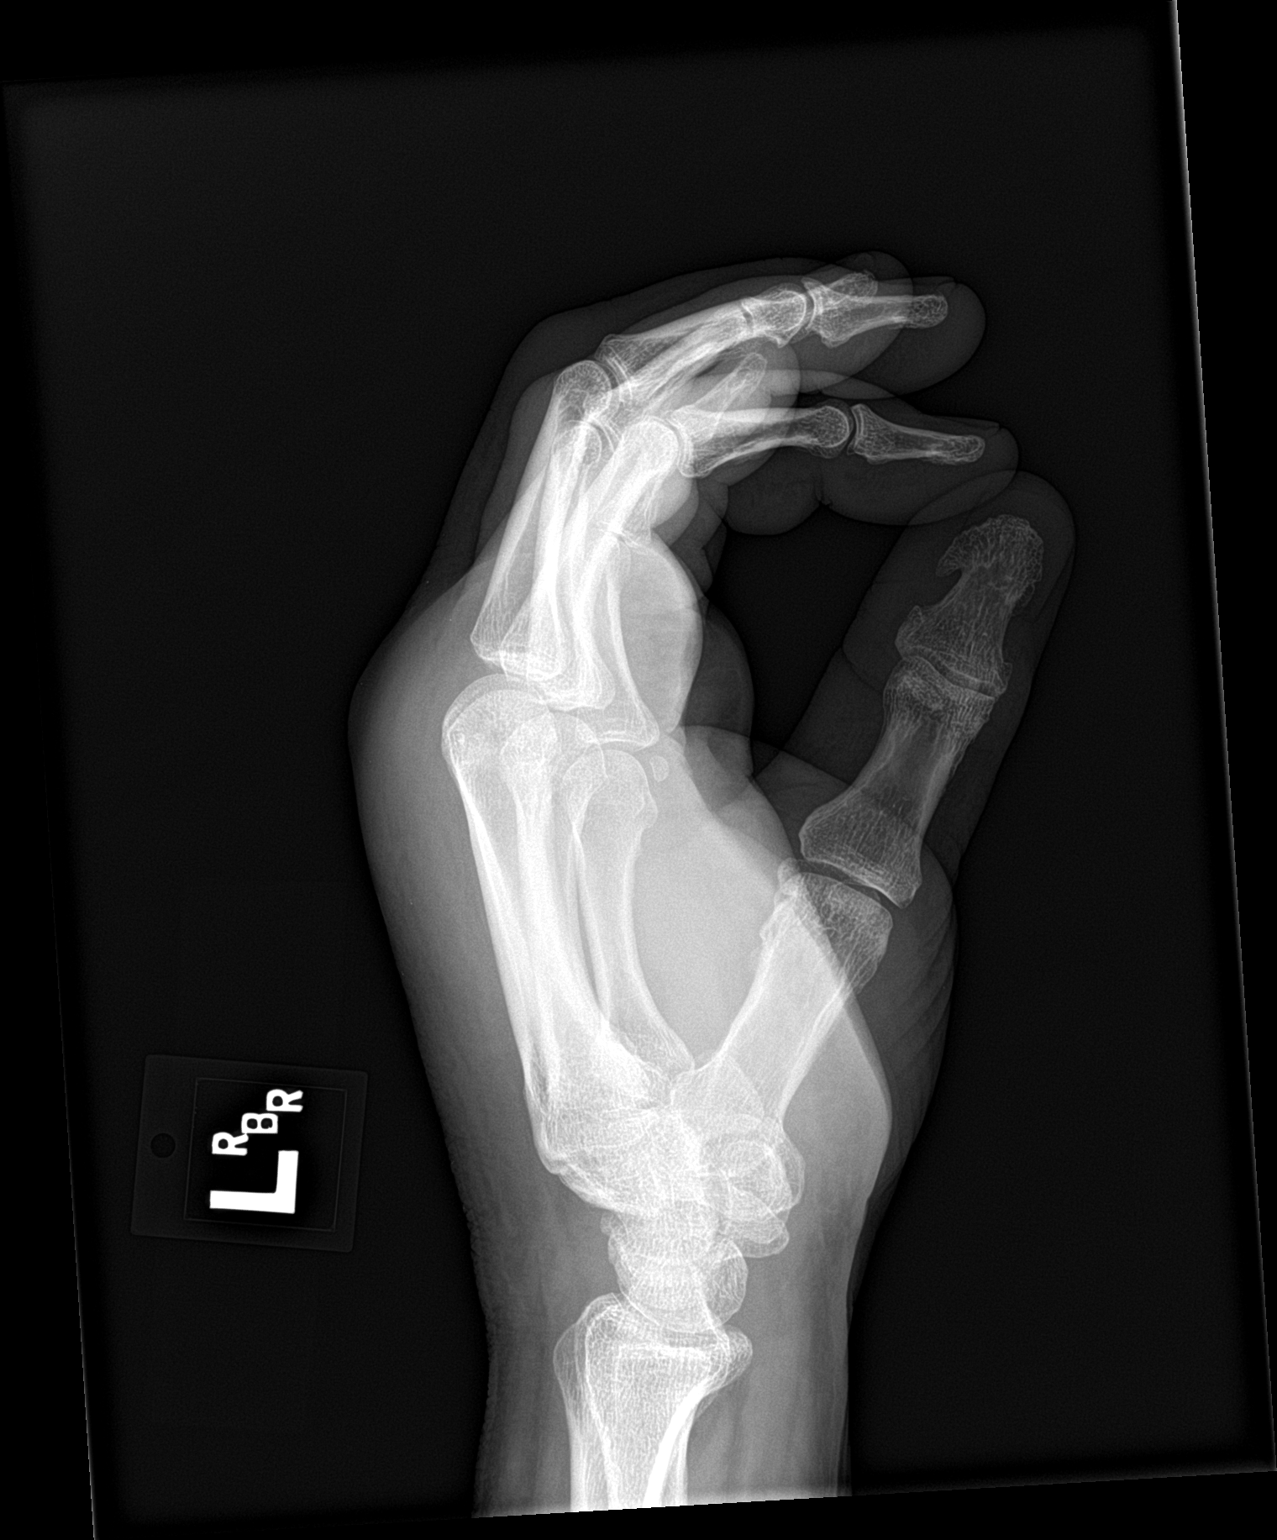

[3 of 3 positions shown; findings below may reference images not displayed]

FINDINGS: Frontal, oblique, and lateral views were obtained. There is soft
tissue swelling. There is no acute fracture or dislocation. Joint
spaces appear unremarkable. No erosive change.
IMPRESSION: Generalized soft tissue swelling. No acute fracture or dislocation.
No appreciable arthropathy.

## 2017-09-08 NOTE — Patient Instructions (Addendum)
RICE for Routine Care of Injuries Theroutine careofmanyinjuriesincludes rest, ice, compression, and elevation (RICE therapy). RICE therapy is often recommended for injuries to soft tissues, such as a muscle strain, ligament injuries, bruises, and overuse injuries. It can also be used for some bony injuries. Using RICE therapy can help to relieve pain, lessen swelling, and enable your body to heal. Rest Rest is required to allow your body to heal. This usually involves reducing your normal activities and avoiding use of the injured part of your body. Generally, you can return to your normal activities when you are comfortable and have been given permission by your health care provider. Follow these instructions at home: Ice  Icing your injury helps to keep the swelling down, and it lessens pain. Do not apply ice directly to your skin.  Put ice in a plastic bag.  Place a towel between your skin and the bag.  Leave the ice on for 20 minutes, 2-3 times a day.  Do this for as long as you are directed by your health care provider. Compression Compression means putting pressure on the injured area. Compression helps to keep swelling down, gives support, and helps with discomfort. Compression may be done with an elastic bandage. If an elastic bandage has been applied, follow these general tips:  Remove and reapply the bandage every 3-4 hours or as directed by your health care provider.  Make sure the bandage is not wrapped too tightly, because this can cut off circulation. If part of your body beyond the bandage becomes blue, numb, cold, swollen, or more painful, your bandage is most likely too tight. If this occurs, remove your bandage and reapply it more loosely.  See your health care provider if the bandage seems to be making your problems worse rather than better.  Elevation  Elevation means keeping the injured area raised. This helps to lessen swelling and decrease pain. If possible,  your injured area should be elevated at or above the level of your heart or the center of your chest. When should I seek medical care?  If your pain and swelling continue.  If your symptoms are getting worse rather than improving. These symptoms may indicate that further evaluation or further X-rays are needed. Sometimes, X-rays may not show a small broken bone (fracture) until a number of days later. Make a follow-up appointment with your health care provider. When should I seek immediate medical care?  If you have sudden severe pain at or below the area of your injury.  If you have redness or increased swelling around your injury.  If you have tingling or numbness at or below the area of your injury that does not improve after you remove the elastic bandage. This information is not intended to replace advice given to you by your health care provider. Make sure you discuss any questions you have with your health care provider. Document Released: 08/07/2000 Document Revised: 06/06/2016 Document Reviewed: 04/02/2014 Elsevier Interactive Patient Education  2018 [place] Injury, Adult There are many types of head injuries. Head injuries can be as minor as a bump, or they can be more severe. More severe head injuries include:  A jarring injury to the brain (concussion).  A bruise of the brain (contusion). This means there is bleeding in the brain that can cause swelling.  A cracked skull (skull fracture).  Bleeding in the brain that collects, clots, and forms a bump (hematoma).  After a head injury, you may need to  be observed for a while in the emergency department or urgent care. Sometimes admission to the hospital is needed. After a head injury has happened, most problems occur within the first 24 hours, but side effects may occur up to 7-10 days after the injury. It is important to watch your condition for any changes. What are the causes? There are many possible causes  of a head injury. A serious head injury may happen to someone who is in a car accident (motor vehicle collision). Other causes of major head injuries include bicycle or motorcycle accidents, sports injuries, and falls. Risk factors This condition is more likely to occur in people who:  Drink a lot of alcohol or use drugs.  Are over the age of 17.  Are at risk for falls.  What are the symptoms? There are many possible symptoms of a head injury. Visible symptoms of a head injury include a bruise, bump, or bleeding at the site of the injury. Other non-visible symptoms include:  Feeling sleepy or not being able to stay awake.  Passing out.  Headache.  Seizures.  Dizziness.  Confusion.  Memory problems.  Nausea or vomiting.  Other possible symptoms that may develop after the head injury include:  Poor attention and concentration.  Fatigue or tiring easily.  Irritability.  Being uncomfortable around bright lights or loud noises.  Anxiety or depression.  Disturbed sleep.  How is this diagnosed? This condition can usually be diagnosed based on your symptoms, a description of the injury, and a physical exam. You may also have imaging tests done, such as a CT scan or MRI. You will also be closely watched. How is this treated? Treatment for this condition depends on the severity and type of injury you have. The main goal of treatment is to prevent complications and allow the brain time to heal. For mild head injury, you may be sent home and treatment may include:  Observation. A responsible adult should stay with you for 24 hours after your injury and check on you often.  Physical rest.  Brain rest.  Pain medicines.  For severe brain injury, treatment may include:  Close observation. This includes hospitalization with frequent physical exams. You may need to go to a hospital that specializes in head injury.  Pain medicines.  Breathing support. This may include  using a ventilator.  Managing the pressure inside the brain (intracranial pressure, or ICP). This may include: ? Monitoring the ICP. ? Giving medicines to decrease the ICP. ? Positioning you to decrease the ICP.  Medicine to prevent seizures.  Surgery to stop bleeding or to remove blood clots (craniotomy).  Surgery to remove part of the skull (decompressive craniectomy). This allows room for the brain to swell.  Follow these instructions at home: Activity  Rest as much as possible and avoid activities that are physically hard or tiring.  Make sure you get enough sleep.  Limit activities that require a lot of thought or attention, such as: ? Watching TV. ? Playing memory games and puzzles. ? Job-related work or homework. ? Working on [employer], and texting.  Avoid activities that could cause another head injury, such as playing sports, until your health care provider approves. Having another head injury, especially before the first one has healed, can be dangerous.  Ask your health care provider when it is safe for you to return to your regular activities, including work or school. Ask your health care provider for a step-by-step plan for gradually  returning to activities.  Ask your health care provider when you can drive, ride a bicycle, or use heavy machinery. Your ability to react may be slower after a brain injury. Never do these activities if you are dizzy.  Lifestyle  Do not drink alcohol until your health care provider approves, and avoid drug use. Alcohol and certain drugs may slow your recovery and can put you at risk of further injury.  If it is harder than usual to remember things, write them down.  If you are easily distracted, try to do one thing at a time.  Talk with family members or close friends when making important decisions.  Tell your friends, family, a trusted colleague, and work Freight forwarder about your injury, symptoms, and restrictions.  Have them watch for any new or worsening problems.  General instructions  Take over-the-counter and prescription medicines only as told by your health care provider.  Have someone stay with you for 24 hours after your head injury. This person should watch you for any changes in your symptoms and be ready to seek medical help, as needed.  Keep all follow-up visits as told by your health care provider. This is important.  Prevention  Work on improving your balance and strength to avoid falls.  Wear a seatbelt when you are in a moving vehicle.  Wear a helmet when riding a bicycle, skiing, or doing any other sport or activity that has a risk of injury.  Drink alcohol only in moderation.  Take safety measures in your home, such as: ? Removing clutter and tripping hazards from floors and stairways. ? Using grab bars in bathrooms and handrails by stairs. ? Placing non-slip mats on floors and in bathtubs. ? Improving lighting in dim areas. Get help right away if:  You have: ? A severe headache that is not helped by medicine. ? Trouble walking, have weakness in your arms and legs, or lose your balance. ? Clear or bloody fluid coming from your nose or ears. ? Changes in your vision. ? A seizure.  You vomit.  Your symptoms get worse.  Your speech is slurred.  You pass out.  You are sleepier and have trouble staying awake.  Your pupils change size. These symptoms may represent a serious problem that is an emergency. Do not wait to see if the symptoms will go away. Get medical help right away. Call your local emergency services (911 in the [place] Do not drive yourself to the hospital. This information is not intended to replace advice given to you by your health care provider. Make sure you discuss any questions you have with your health care provider. Document Released: 04/25/2005 Document Revised: 11/20/2015 Document Reviewed: 11/03/2015 Elsevier Interactive Patient Education   2017 [employer].

## 2017-09-08 NOTE — Progress Notes (Signed)
HPI:                                                                Benjamin Powell is a 72 y.o. male who presents to [facility]: [facility] today for fall and hand injury  Patient presents with his wife today  This is a pleasant 81 yo M with PMH of CVA on ASA, OSA, DM2, lumbar DDD, HTN who presents s/p fall at home yesterday at 3 pm  Reports he tripped on items in his garage, lost his balance and fell on is outstretched left hand, striking the left side of his face. Denies LOC, amnesia, confusion, nausea/vomiting, headache. Had immediate bruising, swelling and pain of his left hand and bruising under his left eye. He is taking full strength asa 325 mg for history of CVA. Wife states he has had a gait imbalance ever since his stroke. He denies dizziness or lightheadedness prior to the fall.    No flowsheet data found.  No flowsheet data found.    Past Medical History:  Diagnosis Date  . Allergy    seasonal  . Anxiety   . Diabetes mellitus   . GERD (gastroesophageal reflux disease)   . Hypertension   . Sleep apnea    Past Surgical History:  Procedure Laterality Date  . [place]   left  . CHOLECYSTECTOMY  1990  . ERCP  1990  . Hammer Toe Repair Right 09/16/2016   RT #5  . Partial Excision Phalanx Left 09/16/2016   LT#5  . TONSILLECTOMY AND ADENOIDECTOMY  1998   and sinus for sleep apnea   Social History   Tobacco Use  . Smoking status: Current Every Day Smoker    Packs/day: 0.30    Years: 50.00    Pack years: 15.00    Types: Cigarettes  . Smokeless tobacco: Never Used  Substance Use Topics  . Alcohol use: No   family history includes Colitis in his brother and sister; Pancreatic cancer in his mother; Stroke in his brother.    ROS: negative except as noted in the HPI  Medications: Current Outpatient Medications  Medication Sig Dispense Refill  . AMBULATORY NON FORMULARY MEDICATION CPAP supplies: regular  size [employer] Nasal Mask [place] mask and heated humidification. Use nightly for OSA (G47.33) 1 Units 0  . aspirin 325 MG tablet Take 325 mg by mouth.    Marland Kitchen atorvastatin (LIPITOR) 20 MG tablet Take 40 mg by mouth.    . Cholecalciferol (VITAMIN D-1000 MAX ST) 1000 units tablet Take 1,000 Units by mouth.    . cloNIDine (CATAPRES) 0.3 MG tablet Take 1 tablet (0.3 mg total) by mouth 2 (two) times daily. Patient needs to schedule a follow up appointment with PCP before more refills. 60 tablet 0  . dutasteride (AVODART) 0.5 MG capsule     . escitalopram (LEXAPRO) 10 MG tablet Take 10 mg by mouth.    . fluconazole (DIFLUCAN) 100 MG tablet     . hydrochlorothiazide (HYDRODIURIL) 50 MG tablet Take 25 mg by mouth.    Marland Kitchen lisinopril (PRINIVIL,ZESTRIL) 10 MG tablet 10 mg.    . metFORMIN (GLUCOPHAGE) 500 MG tablet Do no take 6/24 evening, may resume 500mg  twice a day Mon 6/25    .  Metoprolol Tartrate 75 MG TABS Take 75 mg by mouth.    Marland Kitchen omeprazole (PRILOSEC) 40 MG capsule Take 1 capsule (40 mg total) by mouth daily. Patient needs to schedule a follow up appointment with PCP before more refills. 30 capsule 0  . tamsulosin (FLOMAX) 0.4 MG CAPS capsule 0.4 mg.    . traMADol (ULTRAM) 50 MG tablet Take 1 tablet (50 mg total) by mouth every 12 (twelve) hours as needed. 60 tablet 2  . triamcinolone (KENALOG) 0.1 % paste Apply to cracked lips up to twice a day as needed. 5 g 1   No current facility-administered medications for this visit.    No Known Allergies     Objective:  BP 132/80   Pulse 63   Wt 233 lb (105.7 kg)   BMI 31.60 kg/m  Gen: well-groomed, not ill-appearing, no acute distress HEENT: head normocephalic, ecchymosis of the left infraorbital region, no battle's sign or hemotympanum,  conjunctiva and cornea clear, neck supple, no midline tenderness Pulm: Normal work of breathing, normal phonation, clear to auscultation bilaterally CV: Normal rate, regular rhythm, s1 and s2 distinct, no murmurs,  clicks or rubs Neuro:  GCS 15, cranial nerves II-XII intact, no nystagmus, normal finger-to-nose, normal heel-to-shin, negative pronator drift, normal rapid alternating movements, DTR's intact, normal tone, no tremor MSK: unsteady, wide-stanced gait Left hand: significant bruising and swelling of the left hand and digits, tenderness over the metacarpals, no point tenderness, decreased ROM of the phalanges, unable to fully make a fist due to swelling, wrist is without tenderness or deformity, wrist strength intact, distal pulses intact with brisk capillary refill Mental Status: alert and oriented x 3, speech articulate, and thought processes clear and goal-directed      No results found for this or any previous visit (from the past 72 hour(s)). Dg Wrist Complete Left  Result Date: 09/08/2017 CLINICAL DATA:  Pain following fall EXAM: LEFT WRIST - COMPLETE 3+ VIEW COMPARISON:  None. FINDINGS: Frontal, oblique, lateral, and ulnar deviation scaphoid images were obtained. There is no appreciable fracture or dislocation. Joint spaces appear unremarkable. No erosive change or intra-articular calcification. IMPRESSION: No fracture or dislocation.  No appreciable underlying arthropathy. Electronically Signed   By: Lowella Grip III M.D.   On: 09/08/2017 14:42   Ct Head Wo Contrast  Result Date: 09/08/2017 CLINICAL DATA:  Pain following fall EXAM: CT HEAD WITHOUT CONTRAST TECHNIQUE: Contiguous axial images were obtained from the base of the skull through the vertex without intravenous contrast. COMPARISON:  None. FINDINGS: Brain: There is mild diffuse atrophy. There is no intracranial mass, hemorrhage, extra-axial fluid collection, or midline shift. There is patchy small vessel disease in the centra semiovale bilaterally. There is small vessel disease in each internal capsule and anterior limb external capsule. There is no acute appearing infarct evident. Vascular: No hyperdense vessel. There is no  appreciable vascular calcification evident. Skull: Bony calvarium appears intact. Sinuses/Orbits: Evidence of prior antrostomies bilaterally. Mucosal thickening in several ethmoid air cells with opacification in several ethmoid air cells on the right. Other visualized paranasal sinuses are clear. Frontal sinuses are aplastic. Visualized orbits appear symmetric bilaterally. Other: Visualized mastoid air cells are clear. IMPRESSION: Mild atrophy with patchy supratentorial small vessel disease. No mass or hemorrhage. No acute infarct. No extra-axial fluid collection. Areas of paranasal sinus disease noted. Electronically Signed   By: Lowella Grip III M.D.   On: 09/08/2017 14:45   Dg Hand Complete Left  Result Date: 09/08/2017 CLINICAL DATA:  Pain following  fall EXAM: LEFT HAND - COMPLETE 3+ VIEW COMPARISON:  None. FINDINGS: Frontal, oblique, and lateral views were obtained. There is soft tissue swelling. There is no acute fracture or dislocation. Joint spaces appear unremarkable. No erosive change. IMPRESSION: Generalized soft tissue swelling. No acute fracture or dislocation. No appreciable arthropathy. Electronically Signed   By: Lowella Grip III M.D.   On: 09/08/2017 14:42      Assessment and Plan: 72 y.o. male with  Fall in elderly patient - Plan: CT Head Wo Contrast  Closed head injury, initial encounter - Plan: CT Head Wo Contrast  Hand injuries, left, initial encounter - Plan: DG Hand Complete Left, DG Wrist Complete Left  Patient had a reassuring neuro exam. Given age, blood thinner use, and bruising of the infraorbital region on exam, stat CT head w/o was ordered to r/o hemorrhage. Personally reviewed CT, negative for acute intracranial abnormality/skull fracture.  Left hand x-rays were negative for acute fracture Patient placed in an ace compression bandage and sling RICE protocol Tylenol and Tramadol prn for pain   Patient education and anticipatory guidance given Patient  agrees with treatment plan Follow-up as needed if symptoms worsen or fail to improve  Darlyne Russian PA-C

## 2017-09-11 ENCOUNTER — Encounter: Payer: Self-pay | Admitting: Physician Assistant

## 2017-09-11 DIAGNOSIS — S0990XA Unspecified injury of head, initial encounter: Secondary | ICD-10-CM | POA: Insufficient documentation

## 2017-09-11 DIAGNOSIS — S6992XA Unspecified injury of left wrist, hand and finger(s), initial encounter: Secondary | ICD-10-CM | POA: Insufficient documentation

## 2017-09-11 DIAGNOSIS — R296 Repeated falls: Secondary | ICD-10-CM | POA: Insufficient documentation

## 2017-09-15 ENCOUNTER — Telehealth: Payer: Self-pay | Admitting: Sports Medicine

## 2017-09-15 NOTE — Telephone Encounter (Signed)
Excellent advice Boogs.

## 2017-09-15 NOTE — Telephone Encounter (Signed)
Pt's wife called clinic with concerns about the swelling in Pt's hands. Pt has not been using ice, and has not been elevating the hand. Pt is able to move all fingers, there is normal color on hand, and good cap refill (exam preformed by Pt's wife, and results reported). Pt's wife seems very concerned by the swelling. Advised her to take Pt to UC for evaluation. We do not have any openings in office today, nor any sports medicine physicians. She will get him to use ice and elevate, if there is no relief she will take him to UC for eval.

## 2017-09-22 ENCOUNTER — Ambulatory Visit (INDEPENDENT_AMBULATORY_CARE_PROVIDER_SITE_OTHER): Payer: Medicare HMO | Admitting: Sports Medicine

## 2017-09-22 ENCOUNTER — Other Ambulatory Visit: Payer: Self-pay

## 2017-09-22 ENCOUNTER — Encounter: Payer: Self-pay | Admitting: Osteopathic Medicine

## 2017-09-22 ENCOUNTER — Ambulatory Visit (INDEPENDENT_AMBULATORY_CARE_PROVIDER_SITE_OTHER): Payer: Medicare HMO | Admitting: Osteopathic Medicine

## 2017-09-22 ENCOUNTER — Encounter: Payer: Self-pay | Admitting: Sports Medicine

## 2017-09-22 VITALS — BP 116/76 | HR 57 | Temp 97.5°F | Wt 234.8 lb

## 2017-09-22 DIAGNOSIS — M170 Bilateral primary osteoarthritis of knee: Secondary | ICD-10-CM | POA: Diagnosis not present

## 2017-09-22 DIAGNOSIS — I10 Essential (primary) hypertension: Secondary | ICD-10-CM

## 2017-09-22 DIAGNOSIS — E291 Testicular hypofunction: Secondary | ICD-10-CM

## 2017-09-22 DIAGNOSIS — R5382 Chronic fatigue, unspecified: Secondary | ICD-10-CM

## 2017-09-22 DIAGNOSIS — S6392XA Sprain of unspecified part of left wrist and hand, initial encounter: Secondary | ICD-10-CM | POA: Diagnosis not present

## 2017-09-22 DIAGNOSIS — E119 Type 2 diabetes mellitus without complications: Secondary | ICD-10-CM

## 2017-09-22 MED ORDER — HYDROCHLOROTHIAZIDE 50 MG PO TABS: 25.0000 mg | ORAL_TABLET | Freq: Every day | ORAL | 3 refills | Status: DC

## 2017-09-22 NOTE — Assessment & Plan Note (Signed)
No visible fractures on an x-ray from 2 weeks ago. Get an over-the-counter Velcro wrist brace, we can watch this for now.

## 2017-09-22 NOTE — Progress Notes (Signed)
Subjective:    CC: Follow-up  HPI: Bilateral knee osteoarthritis: 6 weeks ago we did an injection, he had a good initial response, with a steady return of his pain.  He is losing his balance, knees are giving out.  Pain is mild, persistent.  In addition he had a fall, fell onto an outstretched left hand, this was maybe 2 weeks ago.  X-rays were done at the time of injury which showed no evidence of fracture.  Pain is mostly over the fourth metacarpal shaft.  I reviewed the past medical history, family history, social history, surgical history, and allergies today and no changes were needed.  Please see the problem list section below in epic for further details.  Past Medical History: Past Medical History:  Diagnosis Date  . Allergy    seasonal  . Anxiety   . Diabetes mellitus   . GERD (gastroesophageal reflux disease)   . Hypertension   . Sleep apnea    Past Surgical History: Past Surgical History:  Procedure Laterality Date  . [place]   left  . CHOLECYSTECTOMY  1990  . ERCP  1990  . Hammer Toe Repair Right 09/16/2016   RT #5  . Partial Excision Phalanx Left 09/16/2016   LT#5  . TONSILLECTOMY AND ADENOIDECTOMY  1998   and sinus for sleep apnea   Social History: Social History   Socioeconomic History  . Marital status: Married    Spouse name: Not on file  . Number of children: Not on file  . Years of education: Not on file  . Highest education level: Not on file  Occupational History  . Not on file  Social Needs  . Financial resource strain: Not on file  . Food insecurity:    Worry: Not on file    Inability: Not on file  . Transportation needs:    Medical: Not on file    Non-medical: Not on file  Tobacco Use  . Smoking status: Current Every Day Smoker    Packs/day: 0.30    Years: 50.00    Pack years: 15.00    Types: Cigarettes  . Smokeless tobacco: Never Used  Substance and Sexual Activity  . Alcohol use: No  . Drug use: No  . Sexual  activity: Not on file  Lifestyle  . Physical activity:    Days per week: Not on file    Minutes per session: Not on file  . Stress: Not on file  Relationships  . Social connections:    Talks on phone: Not on file    Gets together: Not on file    Attends religious service: Not on file    Active member of club or organization: Not on file    Attends meetings of clubs or organizations: Not on file    Relationship status: Not on file  Other Topics Concern  . Not on file  Social History Narrative  . Not on file   Family History: Family History  Problem Relation Age of Onset  . Stroke Brother   . Colitis Brother   . Pancreatic cancer Mother   . Colitis Sister    Allergies: No Known Allergies Medications: See med rec.  Review of Systems: No fevers, chills, night sweats, weight loss, chest pain, or shortness of breath.   Objective:    General: Well Developed, well nourished, and in no acute distress.  Neuro: Alert and oriented x3, extra-ocular muscles intact, sensation grossly intact.  HEENT: Normocephalic, atraumatic, pupils equal  round reactive to light, neck supple, no masses, no lymphadenopathy, thyroid nonpalpable.  Skin: Warm and dry, no rashes. Cardiac: Regular rate and rhythm, no murmurs rubs or gallops, no lower extremity edema.  Respiratory: Clear to auscultation bilaterally. Not using accessory muscles, speaking in full sentences. Bilateral knees: Normal to inspection, not much swelling, no fluid wave, minimal tenderness at the medial joint line. ROM normal in flexion and extension and lower leg rotation. Ligaments with solid consistent endpoints including ACL, PCL, LCL, MCL. Negative Mcmurray's and provocative meniscal tests. Non painful patellar compression. Patellar and quadriceps tendons unremarkable. Hamstring and quadriceps strength is normal. Left hand: Good strength, full range of motion, tenderness over the fourth metatarsal shaft with significant fusiform  swelling.  X-rays reviewed and are negative.  Impression and Recommendations:    Primary osteoarthritis of both knees Good but short-lived response to bilateral steroid injections. Approval for [place]co supplementation. Formal physical therapy. He should ambulate with a cane.  Hand sprain, left, initial encounter No visible fractures on an x-ray from 2 weeks ago. Get an over-the-counter Velcro wrist brace, we can watch this for now.  I spent 25 minutes with this patient, greater than 50% was face-to-face time counseling regarding the above diagnoses ___________________________________________ Gwen Her. Dianah Field, M.D., ABFM., CAQSM. Primary Care and [place] Instructor of [place] [facility]

## 2017-09-22 NOTE — Progress Notes (Signed)
HPI: Benjamin Powell is a 72 y.o. male who  has a past medical history of Allergy, Anxiety, Diabetes mellitus, GERD (gastroesophageal reflux disease), Hypertension, and Sleep apnea.  he presents to [facility] today, 09/22/17,  for chief complaint of:  Fatigue  On chart review, patient has been seeing multiple providers in this clinic sporadically, [name] is listed as PCP but she hasn't seen this patient. He was seeing an internal medicine practices well, his wife sees me and we would like to establish care.these seem to recall me calling them at this point with results from [place] from [facility], I can't be sure without more chart review but this seems unlikely as there is no reason I would be alerted about a critical value ordered by a physician at that facility. At any rate, I'm more than happy to see him today for this acute visit and see him back later to establish care officially. Today he presents to the office with concerns for fatigue.  Fatigue ongoing for quite some time, daytime somnolence, reporting compliance with CPAP for the most part.   History of poorly controlled diabetes, last A1c was from a few months ago, following with endocrinology. He reports occasional hypoglycemia but also reports taking short acting insulin 3 times a day whether or not he has a meal. On surprisingly, he notes hypoglycemia during such times.   History of hypogonadism and depression as well, patient is not on testosterone supplementation at this time.  He is not on any particularly potent sedating medications, though of course frequent hypoglycemia may be playing a role, we didn't even have the insulin regimen on his list. He is also taking clonidine.    Patient is accompanied by wife, Benjamin Powell, who assists with history-taking.   Past medical history, surgical history, and family history reviewed.  Current medication list and allergy/intolerance information  reviewed.   (See remainder of HPI, ROS, Phys Exam below)    ASSESSMENT/PLAN: we'll get basic workup for fatigue today.   Chronic fatigue - Plan: CBC, COMPLETE METABOLIC PANEL WITH GFR, Lipid panel, TSH, VITAMIN D 25 Hydroxy (Vit-D Deficiency, Fractures), Magnesium  Type 2 diabetes mellitus without complication, without long-term current use of insulin (HCC) - Plan: Hemoglobin A1c, Urinalysis, Routine w reflex microscopic  Hypogonadism in male - Plan: Testosterone Total,Free,Bio, Males  Essential hypertension     Follow-up plan: Return in about 1 week (around 09/29/2017) for review labs, medications . Prior to that visit, I'll do a little bit more chart review and see what other things we might be missing, sounds like too many cooks in the kitchen for this particular patient with his complicated medical issues.     ############################################ ############################################ ############################################ ############################################    Outpatient Encounter Medications as of 09/22/2017  Medication Sig  . AMBULATORY NON FORMULARY MEDICATION CPAP supplies: regular size Fisher&Paykel Nasal Mask Eson mask and heated humidification. Use nightly for OSA (G47.33)  . aspirin 325 MG tablet Take 325 mg by mouth.  Marland Kitchen atorvastatin (LIPITOR) 20 MG tablet Take 40 mg by mouth.  . Cholecalciferol (VITAMIN D-1000 MAX ST) 1000 units tablet Take 1,000 Units by mouth.  . cloNIDine (CATAPRES) 0.3 MG tablet Take 1 tablet (0.3 mg total) by mouth 2 (two) times daily. Patient needs to schedule a follow up appointment with PCP before more refills.  . dutasteride (AVODART) 0.5 MG capsule   . escitalopram (LEXAPRO) 10 MG tablet Take 10 mg by mouth.  . fluconazole (DIFLUCAN) 100 MG  tablet   . hydrochlorothiazide (HYDRODIURIL) 50 MG tablet Take 25 mg by mouth.  Marland Kitchen lisinopril (PRINIVIL,ZESTRIL) 10 MG tablet 10 mg.  . metFORMIN (GLUCOPHAGE) 500 MG tablet  Do no take 6/24 evening, may resume 500mg  twice a day Mon 6/25  . Metoprolol Tartrate 75 MG TABS Take 75 mg by mouth.  Marland Kitchen omeprazole (PRILOSEC) 40 MG capsule Take 1 capsule (40 mg total) by mouth daily. Patient needs to schedule a follow up appointment with PCP before more refills.  . tamsulosin (FLOMAX) 0.4 MG CAPS capsule 0.4 mg.  . traMADol (ULTRAM) 50 MG tablet Take 1 tablet (50 mg total) by mouth every 12 (twelve) hours as needed.  . triamcinolone (KENALOG) 0.1 % paste Apply to cracked lips up to twice a day as needed.   No facility-administered encounter medications on file as of 09/22/2017.    No Known Allergies    Review of Systems:  Constitutional: No recent illness, +fatigue  HEENT: No  headache, no vision change  Cardiac: No  chest pain, No  pressure, No palpitations  Respiratory:  No  shortness of breath. No  Cough  Gastrointestinal: No  abdominal pain, no change on bowel habits  Musculoskeletal: No new myalgia/arthralgia  Skin: No  Rash  Hem/Onc: No  easy bruising/bleeding, No  abnormal lumps/bumps  Neurologic: No  weakness, No  Dizziness  Psychiatric: +concerns with depression, No  concerns with anxiety  Exam:  BP 116/76   Pulse (!) 57   Temp (!) 97.5 F (36.4 C) (Oral)   Wt 234 lb 12.8 oz (106.5 kg)   BMI 31.84 kg/m   Constitutional: VS see above. General Appearance: alert, well-developed, well-nourished, NAD  Eyes: Normal lids and conjunctive, non-icteric sclera  Ears, Nose, Mouth, Throat: MMM, Normal external inspection ears/nares/mouth/lips/gums.  Neck: No masses, trachea midline.   Respiratory: Normal respiratory effort. no wheeze, no rhonchi, no rales  Cardiovascular: S1/S2 normal, no murmur, no rub/gallop auscultated. RRR.   Musculoskeletal: Gait normal. Symmetric and independent movement of all extremities  Neurological: Normal balance/coordination. No tremor.  Skin: warm, dry, intact.   Psychiatric: Normal judgment/insight. Normal mood  and affect. Oriented x3.   Visit summary with medication list and pertinent instructions was printed for patient to review, advised to alert [place] if any changes needed. All questions at time of visit were answered - patient instructed to contact office with any additional concerns. ER/RTC precautions were reviewed with the patient and understanding verbalized.   Follow-up plan: Return in about 1 week (around 09/29/2017) for review labs, medications .  Note: Total time spent 25 minutes, greater than 50% of the visit was spent face-to-face counseling and coordinating care for the following: The primary encounter diagnosis was Type 2 diabetes mellitus without complication, without long-term current use of insulin ([place]). Diagnoses of Hypogonadism in male, Essential hypertension, and Chronic fatigue were also pertinent to this visit.Marland Kitchen  Please note: voice recognition software was used to produce this document, and typos may escape review. Please contact Dr. Sheppard Coil for any needed clarifications.

## 2017-09-22 NOTE — Assessment & Plan Note (Signed)
Good but short-lived response to bilateral steroid injections. Approval for Visco supplementation. Formal physical therapy. He should ambulate with a cane.

## 2017-09-23 LAB — COMPLETE METABOLIC PANEL WITH GFR
AG RATIO: 1.4 (calc) (ref 1.0–2.5)
ALT: 28 U/L (ref 9–46)
AST: 13 U/L (ref 10–35)
Albumin: 3.8 g/dL (ref 3.6–5.1)
Alkaline phosphatase (APISO): 67 U/L (ref 40–115)
BUN/Creatinine Ratio: 15 (calc) (ref 6–22)
BUN: 21 mg/dL (ref 7–25)
CALCIUM: 9.8 mg/dL (ref 8.6–10.3)
CHLORIDE: 101 mmol/L (ref 98–110)
CO2: 31 mmol/L (ref 20–32)
Creat: 1.39 mg/dL — ABNORMAL HIGH (ref 0.70–1.18)
GFR, EST NON AFRICAN AMERICAN: 51 mL/min/{1.73_m2} — AB (ref >=60)
GFR, Est African American: 59 mL/min/{1.73_m2} — ABNORMAL LOW (ref >=60)
GLUCOSE: 199 mg/dL — AB (ref 65–99)
Globulin: 2.8 g/dL (calc) (ref 1.9–3.7)
POTASSIUM: 3.5 mmol/L (ref 3.5–5.3)
Sodium: 140 mmol/L (ref 135–146)
TOTAL PROTEIN: 6.6 g/dL (ref 6.1–8.1)
Total Bilirubin: 0.6 mg/dL (ref 0.2–1.2)

## 2017-09-23 LAB — LIPID PANEL
Cholesterol: 99 mg/dL (ref <200)
HDL: 36 mg/dL — AB (ref >40)
LDL CHOLESTEROL (CALC): 41 mg/dL
NON-HDL CHOLESTEROL (CALC): 63 mg/dL (ref <130)
TRIGLYCERIDES: 139 mg/dL (ref <150)
Total CHOL/HDL Ratio: 2.8 (calc) (ref <5.0)

## 2017-09-23 LAB — CBC
HEMATOCRIT: 43.6 % (ref 38.5–50.0)
Hemoglobin: 14.8 g/dL (ref 13.2–17.1)
MCH: 30.8 pg (ref 27.0–33.0)
MCHC: 33.9 g/dL (ref 32.0–36.0)
MCV: 90.6 fL (ref 80.0–100.0)
MPV: 11.1 fL (ref 7.5–12.5)
PLATELETS: 208 10*3/uL (ref 140–400)
RBC: 4.81 10*6/uL (ref 4.20–5.80)
RDW: 12.8 % (ref 11.0–15.0)
WBC: 8.1 10*3/uL (ref 3.8–10.8)

## 2017-09-23 LAB — URINALYSIS, ROUTINE W REFLEX MICROSCOPIC
Bacteria, UA: NONE SEEN /HPF
Bilirubin Urine: NEGATIVE
HYALINE CAST: NONE SEEN /LPF
Hgb urine dipstick: NEGATIVE
Leukocytes, UA: NEGATIVE
Nitrite: NEGATIVE
SQUAMOUS EPITHELIAL / LPF: NONE SEEN /HPF (ref <=5)
Specific Gravity, Urine: 1.028 (ref 1.001–1.03)
pH: 5.5 (ref 5.0–8.0)

## 2017-09-23 LAB — HEMOGLOBIN A1C
EAG (MMOL/L): 10.3 (calc)
Hgb A1c MFr Bld: 8.1 % of total Hgb — ABNORMAL HIGH (ref <5.7)
Mean Plasma Glucose: 186 (calc)

## 2017-09-23 LAB — TSH: TSH: 3.17 mIU/L (ref 0.40–4.50)

## 2017-09-23 LAB — MAGNESIUM: Magnesium: 1.8 mg/dL (ref 1.5–2.5)

## 2017-09-25 ENCOUNTER — Telehealth: Payer: Self-pay | Admitting: Sports Medicine

## 2017-09-25 LAB — TESTOSTERONE TOTAL,FREE,BIO, MALES
ALBUMIN MSPROF: 3.8 g/dL (ref 3.6–5.1)
Sex Hormone Binding: 35 nmol/L (ref 22–77)
TESTOSTERONE BIOAVAILABLE: 56.1 ng/dL (ref 15.0–150.0)
TESTOSTERONE: 258 ng/dL (ref 250–827)
Testosterone, Free: 32 pg/mL (ref 6.0–73.0)

## 2017-09-25 NOTE — Telephone Encounter (Signed)
-----   Message from Tasia Catchings, [place] sent at 09/25/2017  8:59 AM EDT ----- Information has been submitted to [facility] and awaiting determination.   ----- Message ----- From: Silverio Decamp, MD Sent: 09/22/2017   2:10 PM To: Beatris Ship Shayley Medlin, CMA  Bilateral Orthovisc approval, x-ray confirmed, failed steroid injections.  Unable to use NSAIDs. ___________________________________________ Gwen Her. Dianah Field, M.D., ABFM., CAQSM. Primary Care and [place] Instructor of [place] [employer] [facility] [employer]

## 2017-09-26 NOTE — Telephone Encounter (Signed)
Orthovisc is covered. There is only a copay for the office visit. If billed at the time of service, the copay will be $35. There is no deductible, but the patient has a coinsurance of 20% of the allowable amount. Once the out of pocket is met, the patient will have no financial responsibility. Call reference number is [id number]. Please submit the prescription form from the [facility] portal or fax a prescription to [phone] if you would like for [place] to triage the prescription to the pharmacy.   Left message for patient to call back.

## 2017-10-04 NOTE — Telephone Encounter (Signed)
Spoke with patients wife and she is agreeable to the cost of the injections since we buy and bill. Patient has appointment with Dr. Darene Lamer on 10/06/2017.

## 2017-10-06 ENCOUNTER — Ambulatory Visit (INDEPENDENT_AMBULATORY_CARE_PROVIDER_SITE_OTHER): Payer: Medicare HMO | Admitting: Osteopathic Medicine

## 2017-10-06 ENCOUNTER — Encounter: Payer: Self-pay | Admitting: Sports Medicine

## 2017-10-06 ENCOUNTER — Ambulatory Visit (INDEPENDENT_AMBULATORY_CARE_PROVIDER_SITE_OTHER): Payer: Medicare HMO | Admitting: Sports Medicine

## 2017-10-06 ENCOUNTER — Telehealth: Payer: Self-pay

## 2017-10-06 ENCOUNTER — Encounter: Payer: Self-pay | Admitting: Osteopathic Medicine

## 2017-10-06 VITALS — BP 132/86 | HR 66 | Temp 97.7°F | Wt 235.2 lb

## 2017-10-06 DIAGNOSIS — N400 Enlarged prostate without lower urinary tract symptoms: Secondary | ICD-10-CM

## 2017-10-06 DIAGNOSIS — Z720 Tobacco use: Secondary | ICD-10-CM | POA: Insufficient documentation

## 2017-10-06 DIAGNOSIS — R5382 Chronic fatigue, unspecified: Secondary | ICD-10-CM

## 2017-10-06 DIAGNOSIS — M170 Bilateral primary osteoarthritis of knee: Secondary | ICD-10-CM | POA: Diagnosis not present

## 2017-10-06 DIAGNOSIS — I10 Essential (primary) hypertension: Secondary | ICD-10-CM | POA: Diagnosis not present

## 2017-10-06 DIAGNOSIS — F3341 Major depressive disorder, recurrent, in partial remission: Secondary | ICD-10-CM

## 2017-10-06 DIAGNOSIS — E1169 Type 2 diabetes mellitus with other specified complication: Secondary | ICD-10-CM

## 2017-10-06 DIAGNOSIS — Z9989 Dependence on other enabling machines and devices: Secondary | ICD-10-CM | POA: Diagnosis not present

## 2017-10-06 DIAGNOSIS — Z8673 Personal history of transient ischemic attack (TIA), and cerebral infarction without residual deficits: Secondary | ICD-10-CM | POA: Diagnosis not present

## 2017-10-06 DIAGNOSIS — E291 Testicular hypofunction: Secondary | ICD-10-CM

## 2017-10-06 DIAGNOSIS — E559 Vitamin D deficiency, unspecified: Secondary | ICD-10-CM | POA: Diagnosis not present

## 2017-10-06 DIAGNOSIS — G4733 Obstructive sleep apnea (adult) (pediatric): Secondary | ICD-10-CM | POA: Diagnosis not present

## 2017-10-06 DIAGNOSIS — E1121 Type 2 diabetes mellitus with diabetic nephropathy: Secondary | ICD-10-CM

## 2017-10-06 DIAGNOSIS — K219 Gastro-esophageal reflux disease without esophagitis: Secondary | ICD-10-CM

## 2017-10-06 MED ORDER — HYDROCHLOROTHIAZIDE 25 MG PO TABS: 25.0000 mg | ORAL_TABLET | Freq: Every day | ORAL | 3 refills | Status: DC

## 2017-10-06 MED ORDER — DUTASTERIDE 0.5 MG PO CAPS: 0.5000 mg | ORAL_CAPSULE | Freq: Every day | ORAL | 3 refills | Status: DC

## 2017-10-06 MED ORDER — TAMSULOSIN HCL 0.4 MG PO CAPS: 0.4000 mg | ORAL_CAPSULE | Freq: Every day | ORAL | 3 refills | Status: DC

## 2017-10-06 MED ORDER — ATORVASTATIN CALCIUM 40 MG PO TABS: 40.0000 mg | ORAL_TABLET | Freq: Every day | ORAL | 3 refills | Status: DC

## 2017-10-06 MED ORDER — ESCITALOPRAM OXALATE 10 MG PO TABS: 10.0000 mg | ORAL_TABLET | Freq: Every day | ORAL | 3 refills | Status: DC

## 2017-10-06 MED ORDER — CLONIDINE HCL 0.3 MG PO TABS: 0.3000 mg | ORAL_TABLET | Freq: Two times a day (BID) | ORAL | 3 refills | Status: DC

## 2017-10-06 MED ORDER — CHOLECALCIFEROL 25 MCG (1000 UT) PO TABS: 1000.0000 [IU] | ORAL_TABLET | Freq: Every day | ORAL | 3 refills | Status: DC

## 2017-10-06 MED ORDER — OMEPRAZOLE 40 MG PO CPDR: 40.0000 mg | DELAYED_RELEASE_CAPSULE | Freq: Every day | ORAL | 3 refills | Status: DC

## 2017-10-06 MED ORDER — LISINOPRIL 10 MG PO TABS: 10.0000 mg | ORAL_TABLET | Freq: Every day | ORAL | 3 refills | Status: DC

## 2017-10-06 MED ORDER — ASPIRIN 325 MG PO TABS: 325.0000 mg | ORAL_TABLET | Freq: Every day | ORAL | 1 refills | Status: DC

## 2017-10-06 NOTE — Progress Notes (Signed)
HPI: Benjamin Powell is a 72 y.o. male who  has a past medical history of Allergy, Anxiety, Diabetes mellitus, GERD (gastroesophageal reflux disease), Hypertension, and Sleep apnea.  he presents to [facility] today, 10/06/17,  for chief complaint of:  Review lab results Fatigue Medication refills  Pertinent lab results:  Diabetes: A1c not quite at goal but patient states better than it has been.  He is following with endocrinology.  Chronic kidney disease: Creatinine slightly elevated consistent with CKD 3.  Proteinuria as well.  Patient does not recall any previous discussion of this issue.  Cholesterol: Is actually look pretty good except HDL is low.  Reports compliance with statin  Testosterone low: Previously on injections area patient reports fatigue, wife notes some depression symptoms as well.  History of stroke: Following with neurology, recently seen in the left him on aspirin 325 mg daily.  Patient is also on statin.  Blood pressure is relatively well controlled.  Patient is accompanied by wife who assists with history-taking.     Past medical history, surgical history, and family history reviewed.  Current medication list and allergy/intolerance information reviewed.   (See remainder of HPI, ROS, Phys Exam below)    ASSESSMENT/PLAN:   Type 2 diabetes mellitus with other specified complication, without long-term current use of insulin (HCC) - Continue follow-up with endocrinology - Plan: atorvastatin (LIPITOR) 40 MG tablet  Hypogonadism in male - Talked about risks versus benefits of testosterone replacement therapy, wife would like to hold off for now  Essential hypertension - Elevated on intake, better on recheck.  Continue current medications - Plan: hydrochlorothiazide (HYDRODIURIL) 25 MG tablet, lisinopril (PRINIVIL,ZESTRIL) 10 MG tablet, cloNIDine (CATAPRES) 0.3 MG tablet  Chronic fatigue - Discussed optimize diet/exercise,  consider testosterone replacement  History of stroke - Secondary prevention with aspirin, statin, blood pressure control.  Advised to avoid smoking - Plan: atorvastatin (LIPITOR) 40 MG tablet, aspirin 325 MG tablet  Gastroesophageal reflux disease, esophagitis presence not specified - Stable on current PPI - Plan: omeprazole (PRILOSEC) 40 MG capsule  Microalbuminuric diabetic nephropathy (HCC)  Recurrent major depressive disorder, in partial remission ([place]) - Plan: escitalopram (LEXAPRO) 10 MG tablet  Benign prostatic hyperplasia without lower urinary tract symptoms - Plan: dutasteride (AVODART) 0.5 MG capsule, tamsulosin (FLOMAX) 0.4 MG CAPS capsule  Vitamin D deficiency - Plan: Cholecalciferol (VITAMIN D-1000 MAX ST) 1000 units tablet     Patient Instructions   Continue follow-up with specialists as directed by them: neurology, urology, and endocrinology   Will plan to recheck blood pressure in 3 months    Great job quitting smoking!   Will work on blood pressure control and sugar control as well to prevent stroke or heart attack    Follow-up plan: Return for recheck BP and follow other chronic issues .     ############################################ ############################################ ############################################ ############################################    Outpatient Encounter Medications as of 10/06/2017  Medication Sig  . AMBULATORY NON FORMULARY MEDICATION CPAP supplies: regular size Fisher&Paykel Nasal Mask Eson mask and heated humidification. Use nightly for OSA (G47.33)  . aspirin 325 MG tablet Take 325 mg by mouth.  Marland Kitchen atorvastatin (LIPITOR) 20 MG tablet Take 40 mg by mouth.  . Cholecalciferol (VITAMIN D-1000 MAX ST) 1000 units tablet Take 1,000 Units by mouth.  . cloNIDine (CATAPRES) 0.3 MG tablet Take 1 tablet (0.3 mg total) by mouth 2 (two) times daily. Patient needs to schedule a follow up appointment with PCP before more refills.   Marland Kitchen  dutasteride (AVODART) 0.5 MG capsule   . escitalopram (LEXAPRO) 10 MG tablet Take 10 mg by mouth.  . hydrochlorothiazide (HYDRODIURIL) 50 MG tablet Take 0.5 tablets (25 mg total) by mouth daily.  . insulin aspart (NOVOLOG FLEXPEN) 100 UNIT/ML FlexPen Inject 18 Units into the skin 3 (three) times daily with meals.  . insulin glargine (LANTUS) 100 unit/mL SOPN Inject 15 Units into the skin at bedtime.  Marland Kitchen lisinopril (PRINIVIL,ZESTRIL) 10 MG tablet 10 mg.  . metFORMIN (GLUCOPHAGE) 500 MG tablet Do no take 6/24 evening, may resume 500mg  twice a day Mon 6/25  . Metoprolol Tartrate 75 MG TABS Take 75 mg by mouth.  Marland Kitchen omeprazole (PRILOSEC) 40 MG capsule Take 1 capsule (40 mg total) by mouth daily. Patient needs to schedule a follow up appointment with PCP before more refills.  . tamsulosin (FLOMAX) 0.4 MG CAPS capsule 0.4 mg.  . traMADol (ULTRAM) 50 MG tablet Take 1 tablet (50 mg total) by mouth every 12 (twelve) hours as needed.  . triamcinolone (KENALOG) 0.1 % paste Apply to cracked lips up to twice a day as needed.   No facility-administered encounter medications on file as of 10/06/2017.    No Known Allergies    Review of Systems:  Constitutional: No recent illness, +fatigue  HEENT: No  headache, no vision change  Cardiac: No  chest pain, No  pressure, No palpitations  Respiratory:  No  shortness of breath. No  Cough  Gastrointestinal: No  abdominal pain, no change on bowel habits  Musculoskeletal: No new myalgia/arthralgia  Skin: No  Rash  Hem/Onc: No  easy bruising/bleeding, No  abnormal lumps/bumps  Neurologic: No  weakness, No  Dizziness  Psychiatric: +concerns with depression, No  concerns with anxiety  Exam:  BP 132/86 (BP Location: Right Arm, Patient Position: Sitting, Cuff Size: Large)   Pulse 66   Temp 97.7 F (36.5 C) (Oral)   Wt 235 lb 3.2 oz (106.7 kg)   BMI 31.90 kg/m   Constitutional: VS see above. General Appearance: alert, well-developed, well-nourished,  NAD  Eyes: Normal lids and conjunctive, non-icteric sclera  Ears, Nose, Mouth, Throat: MMM, Normal external inspection ears/nares/mouth/lips/gums.  Neck: No masses, trachea midline.   Respiratory: Normal respiratory effort. no wheeze, no rhonchi, no rales  Cardiovascular: S1/S2 normal, no murmur, no rub/gallop auscultated. RRR.   Musculoskeletal: Gait normal. Symmetric and independent movement of all extremities  Neurological: Normal balance/coordination. No tremor.  Skin: warm, dry, intact.   Psychiatric: Normal judgment/insight. Normal mood and affect. Oriented x3.   Visit summary with medication list and pertinent instructions was printed for patient to review, advised to alert [place] if any changes needed. All questions at time of visit were answered - patient instructed to contact office with any additional concerns. ER/RTC precautions were reviewed with the patient and understanding verbalized.   Follow-up plan: Return for recheck BP and follow other chronic issues .  Note: Total time spent 40 minutes, greater than 50% of the visit was spent face-to-face counseling and coordinating care for the following: The primary encounter diagnosis was Type 2 diabetes mellitus with other specified complication, without long-term current use of insulin ([place]). Diagnoses of Hypogonadism in male, Essential hypertension, Chronic fatigue, History of stroke, Gastroesophageal reflux disease, esophagitis presence not specified, Microalbuminuric diabetic nephropathy ([place]), Recurrent major depressive disorder, in partial remission ([place]), Benign prostatic hyperplasia without lower urinary tract symptoms, and Vitamin D deficiency were also pertinent to this visit.Marland Kitchen  Please note: voice recognition software was used to produce  this document, and typos may escape review. Please contact Dr. Sheppard Coil for any needed clarifications.

## 2017-10-06 NOTE — Assessment & Plan Note (Signed)
Orthovisc injection #1 of 4 into both knees, return in 1 week for #2 of 4.

## 2017-10-06 NOTE — Patient Instructions (Addendum)
   Continue follow-up with specialists as directed by them: neurology, urology, and endocrinology   Will plan to recheck blood pressure in 3 months    Great job quitting smoking!   Will work on blood pressure control and sugar control as well to prevent stroke or heart attack

## 2017-10-06 NOTE — Progress Notes (Signed)
   Procedure: Real-time Ultrasound Guided Injection of the left knee Device: GE Logiq E  Verbal informed consent obtained.  Time-out conducted.  Noted no overlying erythema, induration, or other signs of local infection.  Skin prepped in a sterile fashion.  Local anesthesia: Topical Ethyl chloride.  With sterile technique and under real time ultrasound guidance: 30 mg/2 mL of OrthoVisc (sodium hyaluronate) in a prefilled syringe was injected easily into the knee through a 22-gauge needle. Completed without difficulty  Pain immediately resolved suggesting accurate placement of the medication.  Advised to call if fevers/chills, erythema, induration, drainage, or persistent bleeding.  Images permanently stored and available for review in the ultrasound unit.  Impression: Technically successful ultrasound guided injection.  Procedure: Real-time Ultrasound Guided Injection of the right knee Device: GE Logiq E  Verbal informed consent obtained.  Time-out conducted.  Noted no overlying erythema, induration, or other signs of local infection.  Skin prepped in a sterile fashion.  Local anesthesia: Topical Ethyl chloride.  With sterile technique and under real time ultrasound guidance: 30 mg/2 mL of OrthoVisc (sodium hyaluronate) in a prefilled syringe was injected easily into the knee through a 22-gauge needle. Completed without difficulty  Pain immediately resolved suggesting accurate placement of the medication.  Advised to call if fevers/chills, erythema, induration, drainage, or persistent bleeding.  Images permanently stored and available for review in the ultrasound unit.  Impression: Technically successful ultrasound guided injection.

## 2017-10-06 NOTE — Telephone Encounter (Signed)
Pt's wife left a vm msg requesting feed back from provider. She would like to know if it is safe for pt to take sildenafil. She is concern due to high risk of stroke & all active medication contradictions. Pls advise, thanks.

## 2017-10-11 NOTE — Telephone Encounter (Signed)
Left VM for Pt's wife to return clinic call regarding recommendations.

## 2017-10-11 NOTE — Telephone Encounter (Signed)
I don't see anything on his medication list that would be an absolute contraindication, such as nitrates for chest pain. Sildenafil is not advised for patients who have had heart attack or stroke within the past 6 months, he should be okay to take it now but I may also advise that he ask neurologist for their opinion as well.

## 2017-10-12 ENCOUNTER — Other Ambulatory Visit: Payer: Self-pay | Admitting: Physician Assistant

## 2017-10-12 ENCOUNTER — Ambulatory Visit (INDEPENDENT_AMBULATORY_CARE_PROVIDER_SITE_OTHER): Payer: Medicare HMO | Admitting: Sports Medicine

## 2017-10-12 DIAGNOSIS — M170 Bilateral primary osteoarthritis of knee: Secondary | ICD-10-CM

## 2017-10-12 MED ORDER — SILDENAFIL CITRATE 100 MG PO TABS: 100.0000 mg | ORAL_TABLET | ORAL | 2 refills | Status: DC | PRN

## 2017-10-12 NOTE — Assessment & Plan Note (Signed)
Orthovisc No. 2 of 4 into both knees, return in 1 week for #3 of 4. 

## 2017-10-12 NOTE — Telephone Encounter (Signed)
Sent to pharmacy 

## 2017-10-12 NOTE — Progress Notes (Signed)

## 2017-10-12 NOTE — Telephone Encounter (Signed)
Pt's wife advised of recommendation. She is OK with him to take it. Would like it sent to [facility] in [place]. Will route for Rx to be ordered.

## 2017-10-13 ENCOUNTER — Ambulatory Visit: Payer: Medicare HMO | Admitting: Sports Medicine

## 2017-10-13 DIAGNOSIS — N281 Cyst of kidney, acquired: Secondary | ICD-10-CM | POA: Diagnosis not present

## 2017-10-13 DIAGNOSIS — Z8673 Personal history of transient ischemic attack (TIA), and cerebral infarction without residual deficits: Secondary | ICD-10-CM | POA: Diagnosis not present

## 2017-10-13 DIAGNOSIS — N138 Other obstructive and reflux uropathy: Secondary | ICD-10-CM | POA: Diagnosis not present

## 2017-10-13 DIAGNOSIS — N200 Calculus of kidney: Secondary | ICD-10-CM | POA: Diagnosis not present

## 2017-10-13 DIAGNOSIS — N401 Enlarged prostate with lower urinary tract symptoms: Secondary | ICD-10-CM | POA: Diagnosis not present

## 2017-10-20 ENCOUNTER — Ambulatory Visit (INDEPENDENT_AMBULATORY_CARE_PROVIDER_SITE_OTHER): Payer: Medicare HMO | Admitting: Sports Medicine

## 2017-10-20 DIAGNOSIS — M170 Bilateral primary osteoarthritis of knee: Secondary | ICD-10-CM

## 2017-10-20 DIAGNOSIS — N200 Calculus of kidney: Secondary | ICD-10-CM | POA: Diagnosis not present

## 2017-10-20 DIAGNOSIS — N281 Cyst of kidney, acquired: Secondary | ICD-10-CM | POA: Diagnosis not present

## 2017-10-20 DIAGNOSIS — Z8673 Personal history of transient ischemic attack (TIA), and cerebral infarction without residual deficits: Secondary | ICD-10-CM | POA: Diagnosis not present

## 2017-10-20 DIAGNOSIS — N138 Other obstructive and reflux uropathy: Secondary | ICD-10-CM | POA: Diagnosis not present

## 2017-10-20 DIAGNOSIS — N401 Enlarged prostate with lower urinary tract symptoms: Secondary | ICD-10-CM | POA: Diagnosis not present

## 2017-10-20 NOTE — Assessment & Plan Note (Signed)
Orthovisc No. 3 of 4, return in 1 week for #4 of 4 both knees. We will start with the right knee next time.

## 2017-10-20 NOTE — Progress Notes (Signed)

## 2017-10-27 ENCOUNTER — Telehealth: Payer: Self-pay | Admitting: Sports Medicine

## 2017-10-27 ENCOUNTER — Ambulatory Visit (INDEPENDENT_AMBULATORY_CARE_PROVIDER_SITE_OTHER): Payer: Medicare HMO | Admitting: Sports Medicine

## 2017-10-27 ENCOUNTER — Ambulatory Visit: Payer: Medicare HMO | Admitting: Sports Medicine

## 2017-10-27 ENCOUNTER — Telehealth: Payer: Self-pay | Admitting: Osteopathic Medicine

## 2017-10-27 ENCOUNTER — Encounter: Payer: Self-pay | Admitting: Sports Medicine

## 2017-10-27 DIAGNOSIS — M170 Bilateral primary osteoarthritis of knee: Secondary | ICD-10-CM | POA: Diagnosis not present

## 2017-10-27 NOTE — Assessment & Plan Note (Signed)
Orthovisc injection #4 of 4 into both knees, return in 1 month to evaluate efficacy.

## 2017-10-27 NOTE — Telephone Encounter (Signed)
Pt is concerned about the high dose of Asprin. Is there a safer blood thinner option available. Please advise

## 2017-10-27 NOTE — Progress Notes (Signed)

## 2017-10-27 NOTE — Telephone Encounter (Signed)
PT requested a traMADol (ULTRAM) 50 MG tablets 50 mg Refill. Please advise

## 2017-11-03 ENCOUNTER — Ambulatory Visit: Payer: Medicare HMO | Admitting: Physician Assistant

## 2017-12-01 ENCOUNTER — Ambulatory Visit (INDEPENDENT_AMBULATORY_CARE_PROVIDER_SITE_OTHER): Payer: Medicare HMO | Admitting: Sports Medicine

## 2017-12-01 DIAGNOSIS — M170 Bilateral primary osteoarthritis of knee: Secondary | ICD-10-CM

## 2017-12-01 DIAGNOSIS — I10 Essential (primary) hypertension: Secondary | ICD-10-CM

## 2017-12-01 MED ORDER — TRAMADOL HCL 50 MG PO TABS: 50.0000 mg | ORAL_TABLET | Freq: Three times a day (TID) | ORAL | 2 refills | Status: DC | PRN

## 2017-12-01 MED ORDER — VALSARTAN-HYDROCHLOROTHIAZIDE 320-25 MG PO TABS: 1.0000 | ORAL_TABLET | Freq: Every day | ORAL | 3 refills | Status: DC

## 2017-12-01 MED ORDER — CLONIDINE HCL 0.1 MG PO TABS
ORAL_TABLET | ORAL | 0 refills | Status: DC
Start: 2017-12-01 — End: 2017-12-18

## 2017-12-01 NOTE — Assessment & Plan Note (Signed)
At this point is failed steroid injections, NSAIDs, Visco supplementation, therapy, he needs a knee replacement. Referral to Benjamin Powell, tramadol for use up to 3 times daily in the meantime.

## 2017-12-01 NOTE — Progress Notes (Signed)
Subjective:    CC: Follow-up  HPI: This is a very pleasant 72 year old male, he has bilateral knee osteoarthritis, we have treated him aggressively up until now, NSAIDs, analgesics, tramadol, rehab exercises, activity modification, steroid injections and more recently a course of Orthovisc into both knees, unfortunately has persistent pain, weakness, interferes with his activities of daily living and quality of life.  Hypertension: Historically has been on low-dose lisinopril, hydrochlorothiazide 25, metoprolol and clonidine 0.3 mg twice a day.  Blood pressure has intermittently been elevated, but more recently elevated at home as well.  Agreeable to let me overhaul the entire regimen.  I reviewed the past medical history, family history, social history, surgical history, and allergies today and no changes were needed.  Please see the problem list section below in epic for further details.  Past Medical History: Past Medical History:  Diagnosis Date  . Allergy    seasonal  . Anxiety   . Diabetes mellitus   . GERD (gastroesophageal reflux disease)   . Hypertension   . Sleep apnea    Past Surgical History: Past Surgical History:  Procedure Laterality Date  . [place]   left  . CHOLECYSTECTOMY  1990  . ERCP  1990  . Hammer Toe Repair Right 09/16/2016   RT #5  . Partial Excision Phalanx Left 09/16/2016   LT#5  . TONSILLECTOMY AND ADENOIDECTOMY  1998   and sinus for sleep apnea   Social History: Social History   Socioeconomic History  . Marital status: Married    Spouse name: Not on file  . Number of children: Not on file  . Years of education: Not on file  . Highest education level: Not on file  Occupational History  . Not on file  Social Needs  . Financial resource strain: Not on file  . Food insecurity:    Worry: Not on file    Inability: Not on file  . Transportation needs:    Medical: Not on file    Non-medical: Not on file  Tobacco Use  . Smoking  status: Current Every Day Smoker    Packs/day: 0.30    Years: 50.00    Pack years: 15.00    Types: Cigarettes  . Smokeless tobacco: Never Used  Substance and Sexual Activity  . Alcohol use: No  . Drug use: No  . Sexual activity: Not on file  Lifestyle  . Physical activity:    Days per week: Not on file    Minutes per session: Not on file  . Stress: Not on file  Relationships  . Social connections:    Talks on phone: Not on file    Gets together: Not on file    Attends religious service: Not on file    Active member of club or organization: Not on file    Attends meetings of clubs or organizations: Not on file    Relationship status: Not on file  Other Topics Concern  . Not on file  Social History Narrative  . Not on file   Family History: Family History  Problem Relation Age of Onset  . Stroke Brother   . Colitis Brother   . Pancreatic cancer Mother   . Colitis Sister    Allergies: No Known Allergies Medications: See med rec.  Review of Systems: No fevers, chills, night sweats, weight loss, chest pain, or shortness of breath.   Objective:    General: Well Developed, well nourished, and in no acute distress.  Neuro:  Alert and oriented x3, extra-ocular muscles intact, sensation grossly intact.  HEENT: Normocephalic, atraumatic, pupils equal round reactive to light, neck supple, no masses, no lymphadenopathy, thyroid nonpalpable.  Skin: Warm and dry, no rashes. Cardiac: Regular rate and rhythm, no murmurs rubs or gallops, no lower extremity edema.  Respiratory: Clear to auscultation bilaterally. Not using accessory muscles, speaking in full sentences.  Impression and Recommendations:    Primary osteoarthritis of both knees At this point is failed steroid injections, NSAIDs, Visco supplementation, therapy, he needs a knee replacement. Referral to Dorna Leitz, tramadol for use up to 3 times daily in the meantime.  Hypertension Switching to  valsartan/HCTZ. Starting a down taper on clonidine, if persistently elevated at the follow-up visit we can add amlodipine.  I spent 40 minutes with this patient, greater than 50% was face-to-face time counseling regarding the above diagnoses ___________________________________________ Gwen Her. Dianah Field, M.D., ABFM., CAQSM. Primary Care and [place] Instructor of [place] [employer] [facility] [employer]

## 2017-12-01 NOTE — Assessment & Plan Note (Signed)
Switching to valsartan/HCTZ. Starting a down taper on clonidine, if persistently elevated at the follow-up visit we can add amlodipine.

## 2017-12-07 ENCOUNTER — Telehealth: Payer: Self-pay

## 2017-12-07 DIAGNOSIS — R918 Other nonspecific abnormal finding of lung field: Secondary | ICD-10-CM | POA: Diagnosis not present

## 2017-12-07 DIAGNOSIS — I10 Essential (primary) hypertension: Secondary | ICD-10-CM | POA: Diagnosis not present

## 2017-12-07 DIAGNOSIS — R9431 Abnormal electrocardiogram [ECG] [EKG]: Secondary | ICD-10-CM | POA: Diagnosis not present

## 2017-12-07 DIAGNOSIS — R001 Bradycardia, unspecified: Secondary | ICD-10-CM | POA: Diagnosis not present

## 2017-12-07 DIAGNOSIS — R0602 Shortness of breath: Secondary | ICD-10-CM | POA: Diagnosis not present

## 2017-12-07 NOTE — Telephone Encounter (Signed)
Pt's wife called reporting that for the last week, pt's blood pressures have been running in the 200s over 170s-200s. Pt wife states she know pt needs to go to ER, but she is having a hard time getting him there. Pt has been taking all his meds as prescribed but his blood pressure will not come dow. Last night his last reading was 202/170 and this morning the cuff would not read BP because it was too high.  I advised pt wife that pt needs to be evaluated for his blood pressures and ideally this needs to be done at the ER. I told wife that we have a 10:00 appt available and she declined it, stating she is at work and that pt cannot drive himself. Wife asked how to get ahold of an ambulance to have them take him to ER to be evaluated, advised her to call 911.  Wife states she understands importance and risk, and she will make sure he gets to ER.   FWD note to PCP

## 2017-12-07 NOTE — Telephone Encounter (Signed)
Noted, thanks!

## 2017-12-08 DIAGNOSIS — R001 Bradycardia, unspecified: Secondary | ICD-10-CM | POA: Diagnosis not present

## 2017-12-08 DIAGNOSIS — R9431 Abnormal electrocardiogram [ECG] [EKG]: Secondary | ICD-10-CM | POA: Diagnosis not present

## 2017-12-13 NOTE — Telephone Encounter (Signed)
Would agree! Not much I can do without accurate med list and without seeing him. Needs to bring all pill bottles to his next visit w/ me

## 2017-12-13 NOTE — Telephone Encounter (Signed)
Patient's wife called and states his blood pressure is back elevated. She states he is taking the lisinopril daily and even increased it to 40 mg. I advised this was not on his medication list that he should be on valsartan HCTZ. She told me to call him. I tried, no answer. She reports his blood pressure at 190/116. I told her he needs to go to the ED and have a follow up with a provider this week.

## 2017-12-14 ENCOUNTER — Ambulatory Visit: Payer: Medicare HMO | Admitting: Osteopathic Medicine

## 2017-12-15 ENCOUNTER — Ambulatory Visit: Payer: Medicare HMO

## 2017-12-15 DIAGNOSIS — M25561 Pain in right knee: Secondary | ICD-10-CM | POA: Diagnosis not present

## 2017-12-15 DIAGNOSIS — M25562 Pain in left knee: Secondary | ICD-10-CM | POA: Diagnosis not present

## 2017-12-18 ENCOUNTER — Other Ambulatory Visit: Payer: Self-pay | Admitting: Sports Medicine

## 2017-12-18 DIAGNOSIS — I10 Essential (primary) hypertension: Secondary | ICD-10-CM

## 2017-12-22 DIAGNOSIS — I10 Essential (primary) hypertension: Secondary | ICD-10-CM | POA: Diagnosis not present

## 2017-12-22 DIAGNOSIS — E1159 Type 2 diabetes mellitus with other circulatory complications: Secondary | ICD-10-CM | POA: Diagnosis not present

## 2017-12-22 DIAGNOSIS — Z794 Long term (current) use of insulin: Secondary | ICD-10-CM | POA: Diagnosis not present

## 2017-12-22 DIAGNOSIS — E1165 Type 2 diabetes mellitus with hyperglycemia: Secondary | ICD-10-CM | POA: Diagnosis not present

## 2017-12-29 ENCOUNTER — Other Ambulatory Visit: Payer: Self-pay

## 2017-12-29 DIAGNOSIS — Z794 Long term (current) use of insulin: Secondary | ICD-10-CM | POA: Diagnosis not present

## 2017-12-29 DIAGNOSIS — E119 Type 2 diabetes mellitus without complications: Secondary | ICD-10-CM | POA: Diagnosis not present

## 2017-12-29 DIAGNOSIS — I10 Essential (primary) hypertension: Secondary | ICD-10-CM | POA: Diagnosis not present

## 2017-12-29 DIAGNOSIS — G4733 Obstructive sleep apnea (adult) (pediatric): Secondary | ICD-10-CM | POA: Diagnosis not present

## 2017-12-29 DIAGNOSIS — F419 Anxiety disorder, unspecified: Secondary | ICD-10-CM | POA: Diagnosis not present

## 2017-12-29 DIAGNOSIS — Z8673 Personal history of transient ischemic attack (TIA), and cerebral infarction without residual deficits: Secondary | ICD-10-CM | POA: Diagnosis not present

## 2017-12-29 DIAGNOSIS — E1159 Type 2 diabetes mellitus with other circulatory complications: Secondary | ICD-10-CM | POA: Diagnosis not present

## 2017-12-29 DIAGNOSIS — E1165 Type 2 diabetes mellitus with hyperglycemia: Secondary | ICD-10-CM | POA: Diagnosis not present

## 2017-12-29 DIAGNOSIS — Z9989 Dependence on other enabling machines and devices: Secondary | ICD-10-CM | POA: Diagnosis not present

## 2017-12-29 DIAGNOSIS — Z87891 Personal history of nicotine dependence: Secondary | ICD-10-CM | POA: Diagnosis not present

## 2018-01-04 DIAGNOSIS — G4733 Obstructive sleep apnea (adult) (pediatric): Secondary | ICD-10-CM | POA: Diagnosis not present

## 2018-01-05 DIAGNOSIS — Z794 Long term (current) use of insulin: Secondary | ICD-10-CM | POA: Diagnosis not present

## 2018-01-05 DIAGNOSIS — Z09 Encounter for follow-up examination after completed treatment for conditions other than malignant neoplasm: Secondary | ICD-10-CM | POA: Diagnosis not present

## 2018-01-05 DIAGNOSIS — Z7982 Long term (current) use of aspirin: Secondary | ICD-10-CM | POA: Diagnosis not present

## 2018-01-05 DIAGNOSIS — Z7902 Long term (current) use of antithrombotics/antiplatelets: Secondary | ICD-10-CM | POA: Diagnosis not present

## 2018-01-05 DIAGNOSIS — Z8673 Personal history of transient ischemic attack (TIA), and cerebral infarction without residual deficits: Secondary | ICD-10-CM | POA: Diagnosis not present

## 2018-01-05 DIAGNOSIS — I6350 Cerebral infarction due to unspecified occlusion or stenosis of unspecified cerebral artery: Secondary | ICD-10-CM | POA: Diagnosis not present

## 2018-01-05 DIAGNOSIS — Z79899 Other long term (current) drug therapy: Secondary | ICD-10-CM | POA: Diagnosis not present

## 2018-01-05 DIAGNOSIS — E119 Type 2 diabetes mellitus without complications: Secondary | ICD-10-CM | POA: Diagnosis not present

## 2018-01-19 DIAGNOSIS — E2609 Other primary hyperaldosteronism: Secondary | ICD-10-CM | POA: Diagnosis not present

## 2018-01-19 DIAGNOSIS — E1165 Type 2 diabetes mellitus with hyperglycemia: Secondary | ICD-10-CM | POA: Diagnosis not present

## 2018-01-19 DIAGNOSIS — Z794 Long term (current) use of insulin: Secondary | ICD-10-CM | POA: Diagnosis not present

## 2018-02-02 DIAGNOSIS — E2609 Other primary hyperaldosteronism: Secondary | ICD-10-CM | POA: Diagnosis not present

## 2018-02-02 DIAGNOSIS — E1165 Type 2 diabetes mellitus with hyperglycemia: Secondary | ICD-10-CM | POA: Diagnosis not present

## 2018-02-02 DIAGNOSIS — Z794 Long term (current) use of insulin: Secondary | ICD-10-CM | POA: Diagnosis not present

## 2018-03-16 DIAGNOSIS — E1165 Type 2 diabetes mellitus with hyperglycemia: Secondary | ICD-10-CM | POA: Diagnosis not present

## 2018-03-16 DIAGNOSIS — E2609 Other primary hyperaldosteronism: Secondary | ICD-10-CM | POA: Diagnosis not present

## 2018-03-16 DIAGNOSIS — Z794 Long term (current) use of insulin: Secondary | ICD-10-CM | POA: Diagnosis not present

## 2018-03-16 DIAGNOSIS — Z23 Encounter for immunization: Secondary | ICD-10-CM | POA: Diagnosis not present

## 2018-05-25 DIAGNOSIS — E1165 Type 2 diabetes mellitus with hyperglycemia: Secondary | ICD-10-CM | POA: Diagnosis not present

## 2018-05-25 DIAGNOSIS — Z794 Long term (current) use of insulin: Secondary | ICD-10-CM | POA: Diagnosis not present

## 2018-05-25 DIAGNOSIS — E2609 Other primary hyperaldosteronism: Secondary | ICD-10-CM | POA: Diagnosis not present

## 2018-06-07 DIAGNOSIS — L920 Granuloma annulare: Secondary | ICD-10-CM | POA: Diagnosis not present

## 2018-07-24 ENCOUNTER — Other Ambulatory Visit: Payer: Self-pay | Admitting: Osteopathic Medicine

## 2018-07-24 DIAGNOSIS — I10 Essential (primary) hypertension: Secondary | ICD-10-CM

## 2018-07-25 ENCOUNTER — Other Ambulatory Visit: Payer: Self-pay | Admitting: Sports Medicine

## 2018-07-25 DIAGNOSIS — M170 Bilateral primary osteoarthritis of knee: Secondary | ICD-10-CM

## 2018-07-27 DIAGNOSIS — E2609 Other primary hyperaldosteronism: Secondary | ICD-10-CM | POA: Diagnosis not present

## 2018-07-27 DIAGNOSIS — Z794 Long term (current) use of insulin: Secondary | ICD-10-CM | POA: Diagnosis not present

## 2018-07-27 DIAGNOSIS — E1165 Type 2 diabetes mellitus with hyperglycemia: Secondary | ICD-10-CM | POA: Diagnosis not present

## 2018-07-27 DIAGNOSIS — I639 Cerebral infarction, unspecified: Secondary | ICD-10-CM | POA: Diagnosis not present

## 2018-07-30 DIAGNOSIS — K006 Disturbances in tooth eruption: Secondary | ICD-10-CM | POA: Diagnosis not present

## 2018-08-01 DIAGNOSIS — K006 Disturbances in tooth eruption: Secondary | ICD-10-CM | POA: Diagnosis not present

## 2018-08-01 DIAGNOSIS — R69 Illness, unspecified: Secondary | ICD-10-CM | POA: Diagnosis not present

## 2018-08-09 DIAGNOSIS — K006 Disturbances in tooth eruption: Secondary | ICD-10-CM | POA: Diagnosis not present

## 2018-08-15 DIAGNOSIS — K006 Disturbances in tooth eruption: Secondary | ICD-10-CM | POA: Diagnosis not present

## 2018-08-21 DIAGNOSIS — K006 Disturbances in tooth eruption: Secondary | ICD-10-CM | POA: Diagnosis not present

## 2018-08-24 DIAGNOSIS — N138 Other obstructive and reflux uropathy: Secondary | ICD-10-CM | POA: Diagnosis not present

## 2018-08-24 DIAGNOSIS — N401 Enlarged prostate with lower urinary tract symptoms: Secondary | ICD-10-CM | POA: Diagnosis not present

## 2018-08-24 DIAGNOSIS — N200 Calculus of kidney: Secondary | ICD-10-CM | POA: Diagnosis not present

## 2018-08-24 DIAGNOSIS — Z794 Long term (current) use of insulin: Secondary | ICD-10-CM | POA: Diagnosis not present

## 2018-08-24 DIAGNOSIS — N281 Cyst of kidney, acquired: Secondary | ICD-10-CM | POA: Diagnosis not present

## 2018-08-24 DIAGNOSIS — E1165 Type 2 diabetes mellitus with hyperglycemia: Secondary | ICD-10-CM | POA: Diagnosis not present

## 2018-08-24 DIAGNOSIS — I10 Essential (primary) hypertension: Secondary | ICD-10-CM | POA: Diagnosis not present

## 2018-08-24 DIAGNOSIS — N3941 Urge incontinence: Secondary | ICD-10-CM | POA: Diagnosis not present

## 2018-08-24 DIAGNOSIS — E1159 Type 2 diabetes mellitus with other circulatory complications: Secondary | ICD-10-CM | POA: Diagnosis not present

## 2018-08-28 DIAGNOSIS — K006 Disturbances in tooth eruption: Secondary | ICD-10-CM | POA: Diagnosis not present

## 2018-09-03 DIAGNOSIS — E119 Type 2 diabetes mellitus without complications: Secondary | ICD-10-CM | POA: Diagnosis not present

## 2018-09-05 DIAGNOSIS — G4733 Obstructive sleep apnea (adult) (pediatric): Secondary | ICD-10-CM | POA: Diagnosis not present

## 2018-09-20 DIAGNOSIS — E1165 Type 2 diabetes mellitus with hyperglycemia: Secondary | ICD-10-CM | POA: Diagnosis not present

## 2018-09-29 ENCOUNTER — Other Ambulatory Visit: Payer: Self-pay | Admitting: Osteopathic Medicine

## 2018-09-29 DIAGNOSIS — F3341 Major depressive disorder, recurrent, in partial remission: Secondary | ICD-10-CM

## 2018-10-08 ENCOUNTER — Other Ambulatory Visit: Payer: Self-pay | Admitting: Osteopathic Medicine

## 2018-10-08 ENCOUNTER — Other Ambulatory Visit: Payer: Self-pay | Admitting: Sports Medicine

## 2018-10-08 DIAGNOSIS — K219 Gastro-esophageal reflux disease without esophagitis: Secondary | ICD-10-CM

## 2018-10-08 DIAGNOSIS — M170 Bilateral primary osteoarthritis of knee: Secondary | ICD-10-CM

## 2018-10-19 ENCOUNTER — Other Ambulatory Visit: Payer: Self-pay | Admitting: Physician Assistant

## 2018-10-19 ENCOUNTER — Other Ambulatory Visit: Payer: Self-pay | Admitting: Osteopathic Medicine

## 2018-10-19 DIAGNOSIS — I10 Essential (primary) hypertension: Secondary | ICD-10-CM

## 2018-10-19 DIAGNOSIS — N400 Enlarged prostate without lower urinary tract symptoms: Secondary | ICD-10-CM

## 2018-10-19 NOTE — Telephone Encounter (Signed)
Forwarding medication refill to PCP for review. 

## 2018-10-24 ENCOUNTER — Other Ambulatory Visit: Payer: Self-pay | Admitting: Osteopathic Medicine

## 2018-10-24 DIAGNOSIS — K219 Gastro-esophageal reflux disease without esophagitis: Secondary | ICD-10-CM

## 2018-10-24 DIAGNOSIS — N400 Enlarged prostate without lower urinary tract symptoms: Secondary | ICD-10-CM

## 2018-10-24 NOTE — Telephone Encounter (Signed)
Forwarding medication refill to PCP for review. 

## 2018-10-26 ENCOUNTER — Telehealth: Payer: Self-pay

## 2018-10-26 ENCOUNTER — Other Ambulatory Visit: Payer: Self-pay | Admitting: Sports Medicine

## 2018-10-26 DIAGNOSIS — M170 Bilateral primary osteoarthritis of knee: Secondary | ICD-10-CM

## 2018-10-26 MED ORDER — TRAMADOL HCL 50 MG PO TABS: 50.0000 mg | ORAL_TABLET | Freq: Three times a day (TID) | ORAL | 1 refills | Status: DC | PRN

## 2018-10-26 NOTE — Telephone Encounter (Signed)
I am going to refill it but we need to touch base, have not seen him since sometime last year.  I see that he has an appointment coming up.

## 2018-10-26 NOTE — Telephone Encounter (Signed)
Wife called again. She's following up on  refill for Tramadol. Benjamin Powell only has two pills left.

## 2018-10-26 NOTE — Telephone Encounter (Signed)
Patient's wife called requesting RF on Tramadol.  RX last sent 07/25/18 for #90 with 1 RF  Pt last saw Dr T 12/02/18  Has appt scheduled 11/02/18  RX pended, please advise

## 2018-10-30 ENCOUNTER — Other Ambulatory Visit: Payer: Self-pay | Admitting: Physician Assistant

## 2018-10-30 DIAGNOSIS — I10 Essential (primary) hypertension: Secondary | ICD-10-CM

## 2018-11-02 ENCOUNTER — Ambulatory Visit (INDEPENDENT_AMBULATORY_CARE_PROVIDER_SITE_OTHER): Payer: Medicare HMO | Admitting: Sports Medicine

## 2018-11-02 ENCOUNTER — Encounter: Payer: Self-pay | Admitting: Sports Medicine

## 2018-11-02 ENCOUNTER — Other Ambulatory Visit: Payer: Self-pay | Admitting: Sports Medicine

## 2018-11-02 DIAGNOSIS — M170 Bilateral primary osteoarthritis of knee: Secondary | ICD-10-CM | POA: Diagnosis not present

## 2018-11-02 NOTE — Assessment & Plan Note (Signed)
At the point of last year we had completed several steroid injections, as well as bilateral Orthovisc injections with persistent pain and discomfort. Right knee is worse than left. Proceeding with MRI, it sounds like he saw Dr. Berenice Primas, and knee arthroplasty was not recommended. Before we proceed with a surgical second opinion I would like to see his MRI results. Tramadol was refilled 7 days ago.

## 2018-11-02 NOTE — Progress Notes (Signed)
Subjective:    CC: Bilateral knee pain  HPI: Benjamin Powell is a very pleasant 73 year old male, he has bilateral knee osteoarthritis, and likely bilateral meniscal tears, he has severe bilateral pain making it difficult for him to walk.  Several months ago we performed bilateral steroid injections followed by a full series of Orthovisc without sufficient improvement.  Lately he has been having to take tramadol 1-2 times per day.  He did get an opinion from Dr. Berenice Primas who did not deem him a candidate for knee arthroplasty.  Pain is recurrent, moderate, persistent, localized to the medial joint lines without radiation, no trauma.  I reviewed the past medical history, family history, social history, surgical history, and allergies today and no changes were needed.  Please see the problem list section below in epic for further details.  Past Medical History: Past Medical History:  Diagnosis Date  . Allergy    seasonal  . Anxiety   . Diabetes mellitus   . GERD (gastroesophageal reflux disease)   . Hypertension   . Sleep apnea    Past Surgical History: Past Surgical History:  Procedure Laterality Date  . [place]   left  . CHOLECYSTECTOMY  1990  . ERCP  1990  . Hammer Toe Repair Right 09/16/2016   RT #5  . Partial Excision Phalanx Left 09/16/2016   LT#5  . TONSILLECTOMY AND ADENOIDECTOMY  1998   and sinus for sleep apnea   Social History: Social History   Socioeconomic History  . Marital status: Married    Spouse name: Not on file  . Number of children: Not on file  . Years of education: Not on file  . Highest education level: Not on file  Occupational History  . Not on file  Social Needs  . Financial resource strain: Not on file  . Food insecurity    Worry: Not on file    Inability: Not on file  . Transportation needs    Medical: Not on file    Non-medical: Not on file  Tobacco Use  . Smoking status: Current Every Day Smoker    Packs/day: 0.30    Years: 50.00     Pack years: 15.00    Types: Cigarettes  . Smokeless tobacco: Never Used  Substance and Sexual Activity  . Alcohol use: No  . Drug use: No  . Sexual activity: Not on file  Lifestyle  . Physical activity    Days per week: Not on file    Minutes per session: Not on file  . Stress: Not on file  Relationships  . Social Herbalist on phone: Not on file    Gets together: Not on file    Attends religious service: Not on file    Active member of club or organization: Not on file    Attends meetings of clubs or organizations: Not on file    Relationship status: Not on file  Other Topics Concern  . Not on file  Social History Narrative  . Not on file   Family History: Family History  Problem Relation Age of Onset  . Stroke Brother   . Colitis Brother   . Pancreatic cancer Mother   . Colitis Sister    Allergies: No Known Allergies Medications: See med rec.  Review of Systems: No fevers, chills, night sweats, weight loss, chest pain, or shortness of breath.   Objective:    General: Well Developed, well nourished, and in no acute distress.  Neuro: Alert and oriented x3, extra-ocular muscles intact, sensation grossly intact.  HEENT: Normocephalic, atraumatic, pupils equal round reactive to light, neck supple, no masses, no lymphadenopathy, thyroid nonpalpable.  Skin: Warm and dry, no rashes. Cardiac: Regular rate and rhythm, no murmurs rubs or gallops, no lower extremity edema.  Respiratory: Clear to auscultation bilaterally. Not using accessory muscles, speaking in full sentences. Bilateral knees: Minimally swollen, tender to palpation at the medial joint line bilaterally. ROM normal in flexion and extension and lower leg rotation. Ligaments with solid consistent endpoints including ACL, PCL, LCL, MCL. Negative Mcmurray's and provocative meniscal tests. Non painful patellar compression. Patellar and quadriceps tendons unremarkable. Hamstring and quadriceps  strength is normal.  Impression and Recommendations:    Primary osteoarthritis of both knees At the point of last year we had completed several steroid injections, as well as bilateral Orthovisc injections with persistent pain and discomfort. Right knee is worse than left. Proceeding with MRI, it sounds like he saw Dr. Berenice Primas, and knee arthroplasty was not recommended. Before we proceed with a surgical second opinion I would like to see his MRI results. Tramadol was refilled 7 days ago.   ___________________________________________ Gwen Her. Dianah Field, M.D., ABFM., CAQSM. Primary Care and Sports Medicine [facility]  Adjunct Professor of [place] [employer] [facility] of Medicine

## 2018-11-05 ENCOUNTER — Other Ambulatory Visit: Payer: Self-pay | Admitting: Osteopathic Medicine

## 2018-11-05 DIAGNOSIS — Z8673 Personal history of transient ischemic attack (TIA), and cerebral infarction without residual deficits: Secondary | ICD-10-CM

## 2018-11-05 DIAGNOSIS — E1165 Type 2 diabetes mellitus with hyperglycemia: Secondary | ICD-10-CM | POA: Diagnosis not present

## 2018-11-05 DIAGNOSIS — E1169 Type 2 diabetes mellitus with other specified complication: Secondary | ICD-10-CM

## 2018-11-16 ENCOUNTER — Other Ambulatory Visit: Payer: Self-pay | Admitting: Sports Medicine

## 2018-11-16 DIAGNOSIS — Z794 Long term (current) use of insulin: Secondary | ICD-10-CM | POA: Diagnosis not present

## 2018-11-16 DIAGNOSIS — M170 Bilateral primary osteoarthritis of knee: Secondary | ICD-10-CM

## 2018-11-16 DIAGNOSIS — E2609 Other primary hyperaldosteronism: Secondary | ICD-10-CM | POA: Diagnosis not present

## 2018-11-16 DIAGNOSIS — E1165 Type 2 diabetes mellitus with hyperglycemia: Secondary | ICD-10-CM | POA: Diagnosis not present

## 2018-11-16 MED ORDER — TRAMADOL HCL 50 MG PO TABS: 50.0000 mg | ORAL_TABLET | Freq: Three times a day (TID) | ORAL | 1 refills | Status: DC | PRN

## 2018-11-23 DIAGNOSIS — N281 Cyst of kidney, acquired: Secondary | ICD-10-CM | POA: Diagnosis not present

## 2018-11-23 DIAGNOSIS — N200 Calculus of kidney: Secondary | ICD-10-CM | POA: Diagnosis not present

## 2018-11-23 DIAGNOSIS — N401 Enlarged prostate with lower urinary tract symptoms: Secondary | ICD-10-CM | POA: Diagnosis not present

## 2018-11-23 DIAGNOSIS — N138 Other obstructive and reflux uropathy: Secondary | ICD-10-CM | POA: Diagnosis not present

## 2019-01-16 DIAGNOSIS — E1165 Type 2 diabetes mellitus with hyperglycemia: Secondary | ICD-10-CM | POA: Diagnosis not present

## 2019-02-05 ENCOUNTER — Other Ambulatory Visit: Payer: Medicare HMO

## 2019-02-05 ENCOUNTER — Encounter: Payer: Self-pay | Admitting: Gastroenterology

## 2019-02-05 ENCOUNTER — Telehealth: Payer: Self-pay | Admitting: Gastroenterology

## 2019-02-05 ENCOUNTER — Ambulatory Visit (INDEPENDENT_AMBULATORY_CARE_PROVIDER_SITE_OTHER): Payer: Medicare HMO | Admitting: Gastroenterology

## 2019-02-05 VITALS — BP 126/76 | HR 85 | Temp 97.9°F | Ht 72.0 in | Wt 238.2 lb

## 2019-02-05 DIAGNOSIS — K529 Noninfective gastroenteritis and colitis, unspecified: Secondary | ICD-10-CM | POA: Diagnosis not present

## 2019-02-05 DIAGNOSIS — R197 Diarrhea, unspecified: Secondary | ICD-10-CM | POA: Diagnosis not present

## 2019-02-05 MED ORDER — CHOLESTYRAMINE 4 G PO PACK: 4.0000 g | PACK | Freq: Every day | ORAL | 5 refills | Status: DC

## 2019-02-05 NOTE — Telephone Encounter (Signed)
The pt was last seen in 2013 by Dr Fuller Plan for colonoscopy.  Has not been seen by any GI provider.  The pt's wife states that the pt has had several tooth infections and is being seen today as well for another tooth issue.  She says he has had diarrhea for a "while".  She says he is incontinent of stool and has no appetite.  Of note the pt's wife is adamant that Dr Ardis Hughs is the MD who performed the last colon.  I explained that it was Dr Fuller Plan and she tells me that it must be a mistake.  He has been scheduled to see Janett Billow today at 1:30 pm

## 2019-02-05 NOTE — Telephone Encounter (Signed)
Pt's wife called stating that pt has been having uncontrollable diarrhea for while, she would like for pt to be seen today. Pls call her.

## 2019-02-05 NOTE — Patient Instructions (Signed)
If you are age 72 or older, your body mass index should be between 23-30. Your Body mass index is 32.31 kg/m. If this is out of the aforementioned range listed, please consider follow up with your Primary Care Provider.  If you are age 56 or younger, your body mass index should be between 19-25. Your Body mass index is 32.31 kg/m. If this is out of the aformentioned range listed, please consider follow up with your Primary Care Provider.   Your provider has requested that you go to the basement level for lab work before leaving today. Press "B" on the elevator. The lab is located at the first door on the left as you exit the elevator.  We have sent the following medications to your pharmacy for you to pick up at your convenience:  Lucrezia Starch  We will call you with results.  Thank you for choosing me and [facility].   Alonza Bogus, PA-C

## 2019-02-05 NOTE — Progress Notes (Signed)
02/05/2019 LAJARVIS FARLEIGH [id number] November 10, 1945   HISTORY OF PRESENT ILLNESS: This is a 73 year old male who is a patient of Dr. Lynne Leader.  He is here today with his wife for complaints of diarrhea.  She tells me that he has had diarrhea for several years, but it has significantly worsened over the years and more so recently over the past several months.  He has very loose urgent stools and he has had multiple accidents of incontinence.  He denies seeing blood in his stool.  He denies any abdominal pain except for some lower abdominal cramping.  He does not have a gallbladder as that was removed about 25 or so years ago.  They tell me that he has had a lot of dental infections and has had a lot of dental work done and has been on multiple antibiotics.  Most recently he was on amoxicillin but has been off of that for the past week.  He apparently had tried a pre/proprobiotic, but felt like that worsened his symptoms.  He is on metformin for his diabetes, but is only taking 500 mg daily, which is much lower doses than he was on previously.  His wife tells me he had been off of the metformin totally for like a year and still was having diarrhea.  They said typically he has about 4 bowel movements a day, but it has been 8 or so a day at times.  She says that he is absolutely depressed over this and his totally ruined his quality of life.  His last colonoscopy was in October 2013 at which time he had multiple polyps removed, but they were all hyperplastic polyps.  Past Medical History:  Diagnosis Date  . Allergy    seasonal  . Anxiety   . Diabetes mellitus   . GERD (gastroesophageal reflux disease)   . Hypertension   . Sleep apnea    Past Surgical History:  Procedure Laterality Date  . [place]   left  . CHOLECYSTECTOMY  1990  . ERCP  1990  . Hammer Toe Repair Right 09/16/2016   RT #5  . Partial Excision Phalanx Left 09/16/2016   LT#5  . TONSILLECTOMY AND ADENOIDECTOMY  1998    and sinus for sleep apnea    reports that he has been smoking cigarettes. He has a 15.00 pack-year smoking history. He has never used smokeless tobacco. He reports that he does not drink alcohol or use drugs. family history includes Colitis in his brother and sister; Pancreatic cancer in his mother; Stroke in his brother. No Known Allergies    Outpatient Encounter Medications as of 02/05/2019  Medication Sig  . AMBULATORY NON FORMULARY MEDICATION CPAP supplies: regular size [employer] Nasal Mask [place] mask and heated humidification. Use nightly for OSA (G47.33)  . aspirin 81 MG chewable tablet Chew 81 mg by mouth daily.  Marland Kitchen atorvastatin (LIPITOR) 40 MG tablet Take 1 tablet (40 mg total) by mouth daily.  . Cholecalciferol (VITAMIN D-1000 MAX ST) 1000 units tablet Take 1 tablet (1,000 Units total) by mouth daily.  Marland Kitchen escitalopram (LEXAPRO) 10 MG tablet Take 1 tablet (10 mg total) by mouth daily.  . insulin aspart (NOVOLOG FLEXPEN) 100 UNIT/ML FlexPen Inject 18 Units into the skin 3 (three) times daily with meals.  . insulin glargine (LANTUS) 100 unit/mL SOPN Inject 15 Units into the skin at bedtime.  . metFORMIN (GLUCOPHAGE) 500 MG tablet Do no take 6/24 evening, may resume 500mg  twice a  day Mon 6/25  . Metoprolol Tartrate 75 MG TABS Take 75 mg by mouth.  . tamsulosin (FLOMAX) 0.4 MG CAPS capsule TAKE ONE CAPSULE BY MOUTH DAILY  . traMADol (ULTRAM) 50 MG tablet Take 1 tablet (50 mg total) by mouth 3 (three) times daily as needed.  . triamcinolone (KENALOG) 0.1 % paste Apply to cracked lips up to twice a day as needed.  . valsartan-hydrochlorothiazide (DIOVAN-HCT) 320-25 MG tablet Take 1 tablet by mouth daily.  . [DISCONTINUED] aspirin 325 MG tablet Take 1 tablet (325 mg total) by mouth daily.  . [DISCONTINUED] cloNIDine (CATAPRES) 0.1 MG tablet TAKE 2 TABLETS ( 0.2 MG TOTAL ) BY MOUTH TWICE A DAY FOR 1 WEEK, THEN TAKE 1 TABLET ( 0.1 MG TOTAL ) TWICE DAILY FOR A WEEK THEN STOP  . [DISCONTINUED]  dutasteride (AVODART) 0.5 MG capsule Take 1 capsule (0.5 mg total) by mouth daily. Needs appt  . [DISCONTINUED] hydrochlorothiazide (HYDRODIURIL) 25 MG tablet TAKE ONE TABLET BY MOUTH DAILY . NEED OFFICE APPT.  . [DISCONTINUED] omeprazole (PRILOSEC) 40 MG capsule TAKE ONE CAPSULE BY MOUTH DAILY   No facility-administered encounter medications on file as of 02/05/2019.      REVIEW OF SYSTEMS  : All other systems reviewed and negative except where noted in the History of Present Illness.   PHYSICAL EXAM: BP 126/76   Pulse 85   Temp 97.9 F (36.6 C) (Oral)   Ht 6' (1.829 m)   Wt 238 lb 3.2 oz (108 kg)   BMI 32.31 kg/m  General: Well developed white male in no acute distress Head: Normocephalic and atraumatic Eyes:  Sclerae anicteric, conjunctiva pink. Ears: Normal auditory acuity Lungs: Clear throughout to auscultation; no increased WOB. Heart: Regular rate and rhythm; no M/R/G. Abdomen: Soft, non-distended.  BS present.  Non-tender.  Glucose tracker noted on left abdomen. Musculoskeletal: Symmetrical with no gross deformities  Skin: No lesions on visible extremities Extremities: No edema  Neurological: Alert oriented x 4, grossly non-focal Psychological:  Alert and cooperative. Normal mood and affect  ASSESSMENT AND PLAN: *Acute on chronic diarrhea: His wife expresses to me that he has had diarrhea for quite some time, but it seems to have worsened over the years and particularly recently.  He does not have a gallbladder so could be component of bile salt related diarrhea.  He has been on multiple antibiotics for long periods of time for oral/dental infections.  First and foremost I want to rule out C. difficile and other GI infectious sources.  Will order stool studies.  If these are negative then he may need colonoscopy to rule out IBD and with possible biopsies to rule out microscopic colitis.  I am going to start him on a trial of Questran 1 packet daily in the interim to see if  this helps; prescription sent.   CC:  Donella Stade, PA-C

## 2019-02-06 NOTE — Progress Notes (Signed)
Reviewed and agree with management plan.  Janene Yousuf T. Auset Fritzler, MD FACG [facility]  

## 2019-02-07 ENCOUNTER — Other Ambulatory Visit: Payer: Medicare HMO

## 2019-02-07 DIAGNOSIS — K529 Noninfective gastroenteritis and colitis, unspecified: Secondary | ICD-10-CM | POA: Diagnosis not present

## 2019-02-07 DIAGNOSIS — R197 Diarrhea, unspecified: Secondary | ICD-10-CM

## 2019-02-07 LAB — C.DIFFICILE TOXIN: C. Difficile Toxin A: NOT DETECTED

## 2019-02-08 DIAGNOSIS — D229 Melanocytic nevi, unspecified: Secondary | ICD-10-CM | POA: Diagnosis not present

## 2019-02-08 DIAGNOSIS — L853 Xerosis cutis: Secondary | ICD-10-CM | POA: Diagnosis not present

## 2019-02-08 DIAGNOSIS — L920 Granuloma annulare: Secondary | ICD-10-CM | POA: Diagnosis not present

## 2019-02-08 DIAGNOSIS — L821 Other seborrheic keratosis: Secondary | ICD-10-CM | POA: Diagnosis not present

## 2019-02-08 DIAGNOSIS — L820 Inflamed seborrheic keratosis: Secondary | ICD-10-CM | POA: Diagnosis not present

## 2019-02-08 DIAGNOSIS — D1801 Hemangioma of skin and subcutaneous tissue: Secondary | ICD-10-CM | POA: Diagnosis not present

## 2019-02-08 LAB — GASTROINTESTINAL PATHOGEN PANEL PCR
C. difficile Tox A/B, PCR: NOT DETECTED
Campylobacter, PCR: NOT DETECTED
Cryptosporidium, PCR: NOT DETECTED
E coli (ETEC) LT/ST PCR: NOT DETECTED
E coli (STEC) stx1/stx2, PCR: NOT DETECTED
E coli 0157, PCR: NOT DETECTED
Giardia lamblia, PCR: NOT DETECTED
Norovirus, PCR: NOT DETECTED
Rotavirus A, PCR: NOT DETECTED
Salmonella, PCR: NOT DETECTED
Shigella, PCR: NOT DETECTED

## 2019-02-12 ENCOUNTER — Telehealth: Payer: Self-pay | Admitting: Gastroenterology

## 2019-02-12 NOTE — Telephone Encounter (Signed)
Results negative, message left on voicemail as requested.

## 2019-02-26 ENCOUNTER — Telehealth: Payer: Self-pay | Admitting: Gastroenterology

## 2019-02-26 NOTE — Telephone Encounter (Signed)
Pt's wife informed that pt is taking Plavix and is diabetic.  She requested a Friday colonoscopy.  Pt was advised to call back early November to schedule for December.

## 2019-02-26 NOTE — Telephone Encounter (Signed)
Yes, he does need to be scheduled for a colon with Dr Fuller Plan for diarrhea and a previsit.  Thanks

## 2019-02-27 DIAGNOSIS — E1165 Type 2 diabetes mellitus with hyperglycemia: Secondary | ICD-10-CM | POA: Diagnosis not present

## 2019-03-01 DIAGNOSIS — E1165 Type 2 diabetes mellitus with hyperglycemia: Secondary | ICD-10-CM | POA: Diagnosis not present

## 2019-03-01 DIAGNOSIS — F17210 Nicotine dependence, cigarettes, uncomplicated: Secondary | ICD-10-CM | POA: Diagnosis not present

## 2019-03-01 DIAGNOSIS — Z794 Long term (current) use of insulin: Secondary | ICD-10-CM | POA: Diagnosis not present

## 2019-03-01 DIAGNOSIS — Z8673 Personal history of transient ischemic attack (TIA), and cerebral infarction without residual deficits: Secondary | ICD-10-CM | POA: Diagnosis not present

## 2019-03-01 DIAGNOSIS — I10 Essential (primary) hypertension: Secondary | ICD-10-CM | POA: Diagnosis not present

## 2019-03-01 DIAGNOSIS — E119 Type 2 diabetes mellitus without complications: Secondary | ICD-10-CM | POA: Diagnosis not present

## 2019-03-15 ENCOUNTER — Encounter: Payer: Self-pay | Admitting: Gastroenterology

## 2019-03-18 DIAGNOSIS — E1165 Type 2 diabetes mellitus with hyperglycemia: Secondary | ICD-10-CM | POA: Diagnosis not present

## 2019-03-18 DIAGNOSIS — Z794 Long term (current) use of insulin: Secondary | ICD-10-CM | POA: Diagnosis not present

## 2019-03-18 DIAGNOSIS — E2609 Other primary hyperaldosteronism: Secondary | ICD-10-CM | POA: Diagnosis not present

## 2019-03-22 ENCOUNTER — Ambulatory Visit (INDEPENDENT_AMBULATORY_CARE_PROVIDER_SITE_OTHER): Payer: Medicare HMO | Admitting: Sports Medicine

## 2019-03-22 ENCOUNTER — Telehealth: Payer: Self-pay | Admitting: Gastroenterology

## 2019-03-22 ENCOUNTER — Encounter: Payer: Self-pay | Admitting: Sports Medicine

## 2019-03-22 ENCOUNTER — Ambulatory Visit (INDEPENDENT_AMBULATORY_CARE_PROVIDER_SITE_OTHER): Payer: Medicare HMO

## 2019-03-22 ENCOUNTER — Other Ambulatory Visit: Payer: Self-pay

## 2019-03-22 DIAGNOSIS — M170 Bilateral primary osteoarthritis of knee: Secondary | ICD-10-CM

## 2019-03-22 DIAGNOSIS — G8929 Other chronic pain: Secondary | ICD-10-CM | POA: Diagnosis not present

## 2019-03-22 DIAGNOSIS — M25562 Pain in left knee: Secondary | ICD-10-CM

## 2019-03-22 DIAGNOSIS — M1711 Unilateral primary osteoarthritis, right knee: Secondary | ICD-10-CM | POA: Diagnosis not present

## 2019-03-22 DIAGNOSIS — M25561 Pain in right knee: Secondary | ICD-10-CM | POA: Diagnosis not present

## 2019-03-22 DIAGNOSIS — M1712 Unilateral primary osteoarthritis, left knee: Secondary | ICD-10-CM | POA: Diagnosis not present

## 2019-03-22 IMAGING — DX DG KNEE COMPLETE 4+V*L*
6 series · 6 of 6 positions shown · non-contrast
Comparison: None.

CLINICAL DATA: Chronic medial knee pain, no injury

EXAM:
LEFT KNEE - COMPLETE 4+ VIEW; RIGHT KNEE - COMPLETE 4+ VIEW

[tunnel]
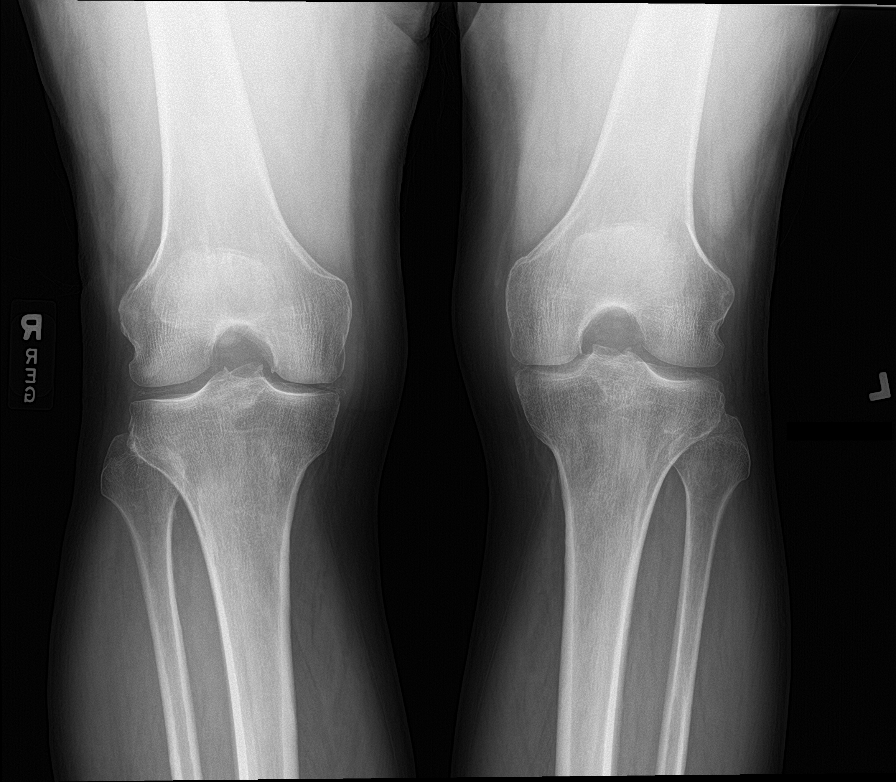

[knee lat (1 of 2)]
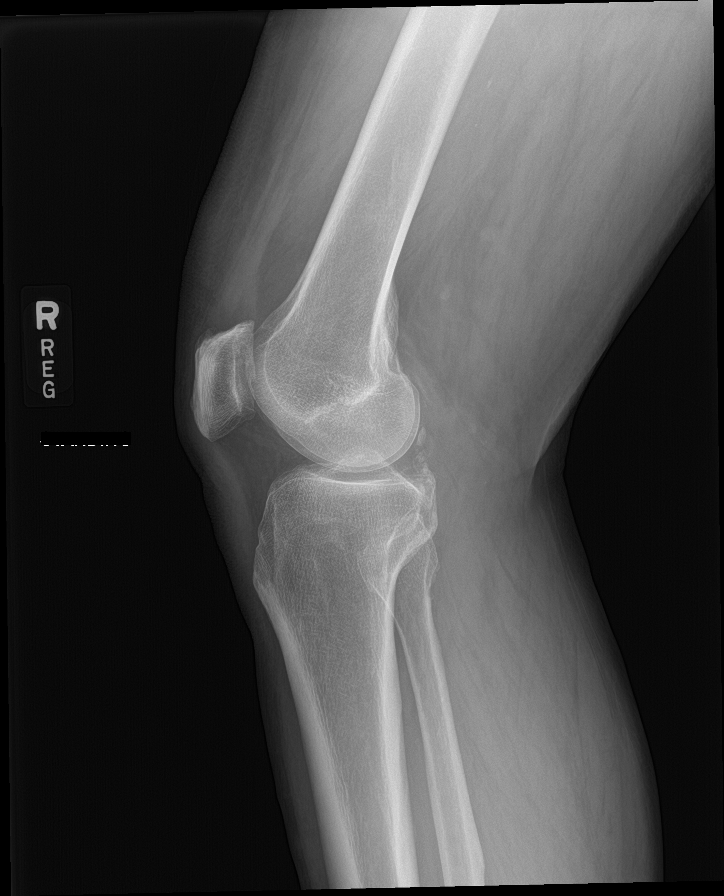

[knee sunrise (1 of 2)]
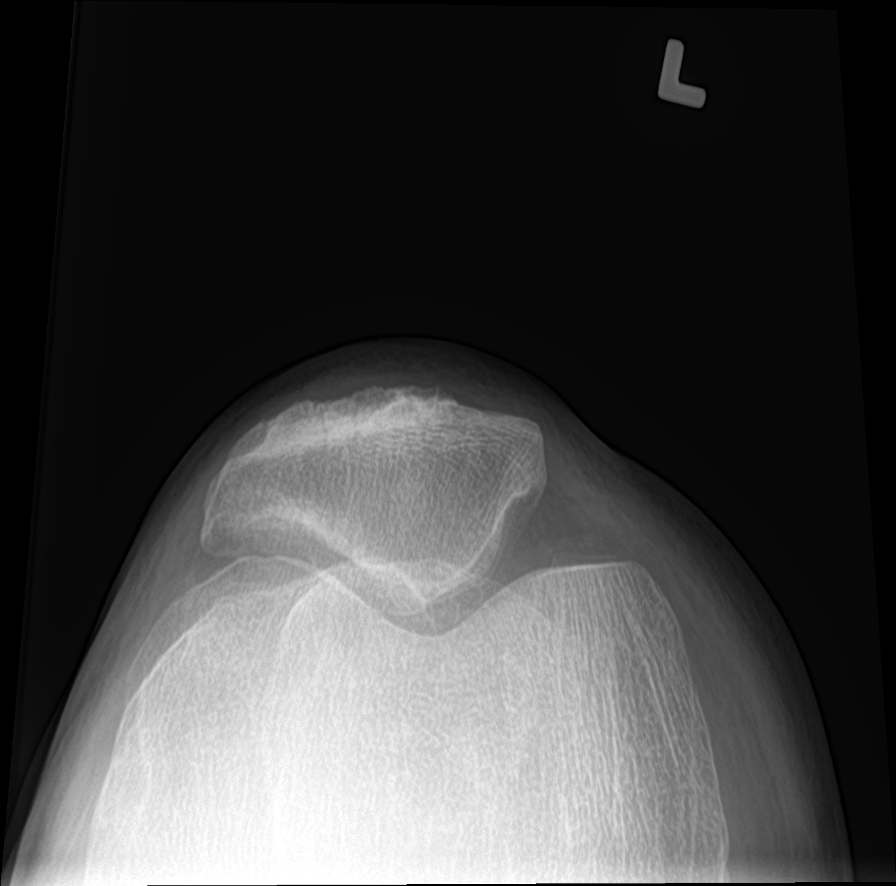

[knee ap bilat standing]
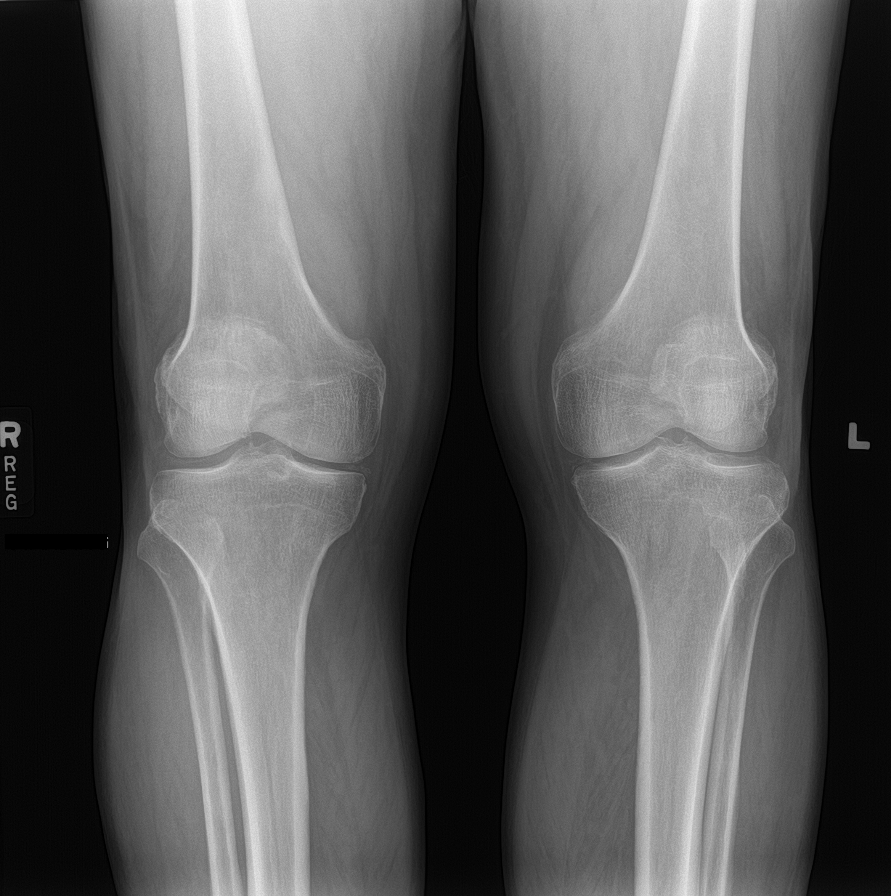

[knee lat (2 of 2)]
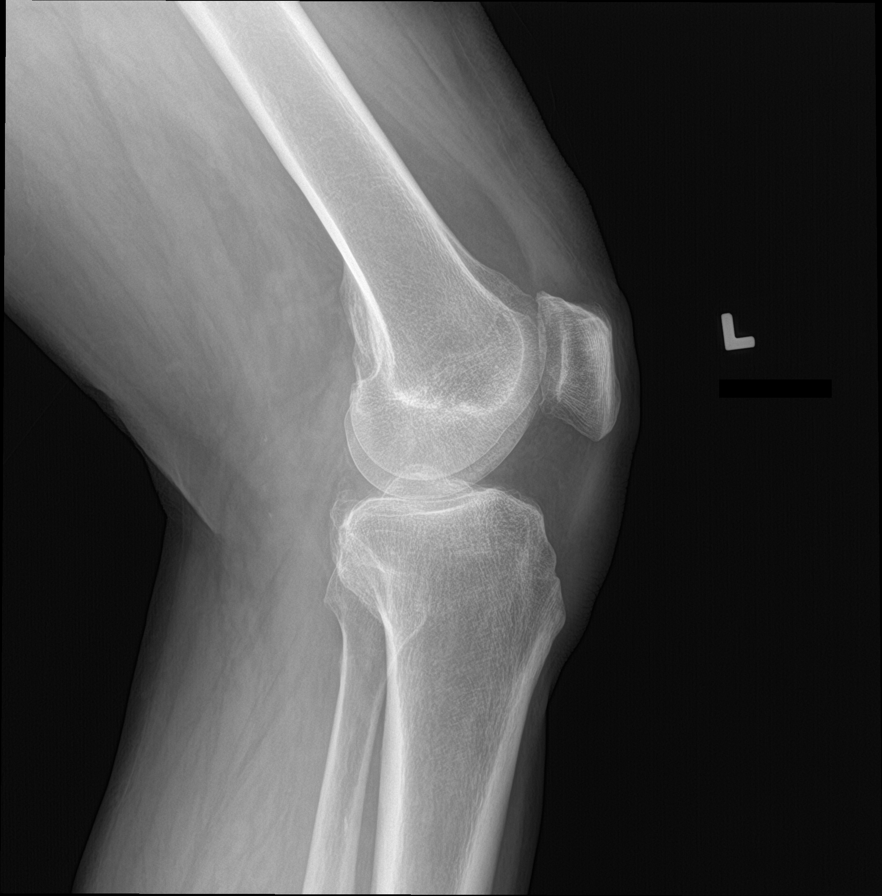

[knee sunrise (2 of 2)]
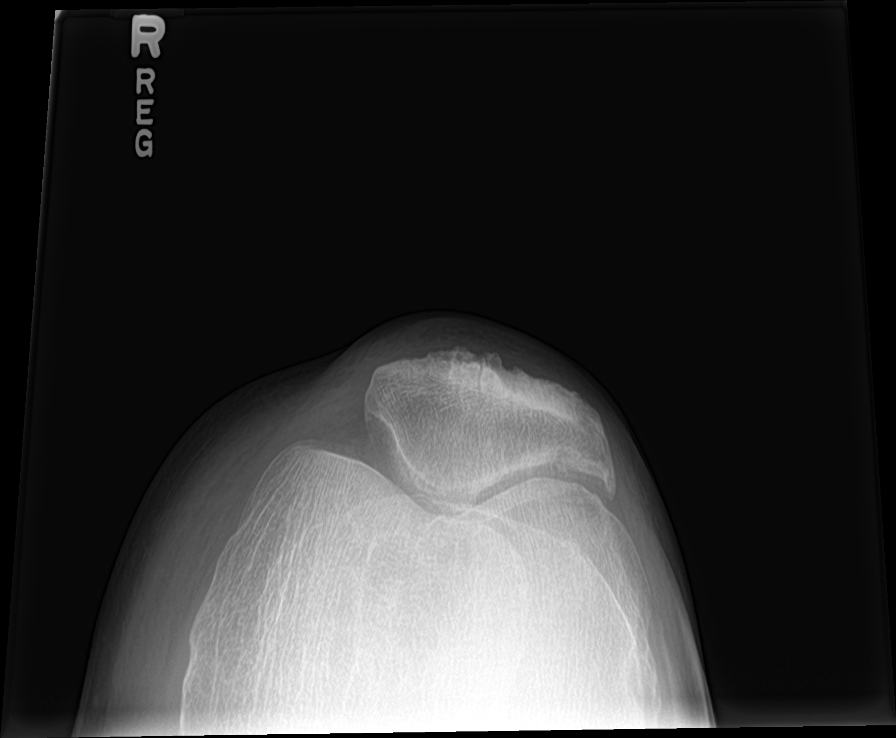

[6 of 6 positions shown; findings below may reference images not displayed]

FINDINGS: No fracture or dislocation of the bilateral knees. There is mild,
symmetric bilateral tricompartmental joint space narrowing without
significant arthrosis. There is bilateral chondrocalcinosis. No knee
joint effusion. Soft tissues are unremarkable.
IMPRESSION: 1. No fracture or dislocation of the bilateral knees.

2. There is mild, symmetric bilateral tricompartmental joint space
narrowing without significant arthrosis.

3. There is bilateral chondrocalcinosis, which can be seen in CPPD
(pseudogout).

## 2019-03-22 IMAGING — DX DG KNEE COMPLETE 4+V*R*
6 series · 6 of 6 positions shown · non-contrast
Comparison: None.

CLINICAL DATA: Chronic medial knee pain, no injury

EXAM:
LEFT KNEE - COMPLETE 4+ VIEW; RIGHT KNEE - COMPLETE 4+ VIEW

[tunnel]
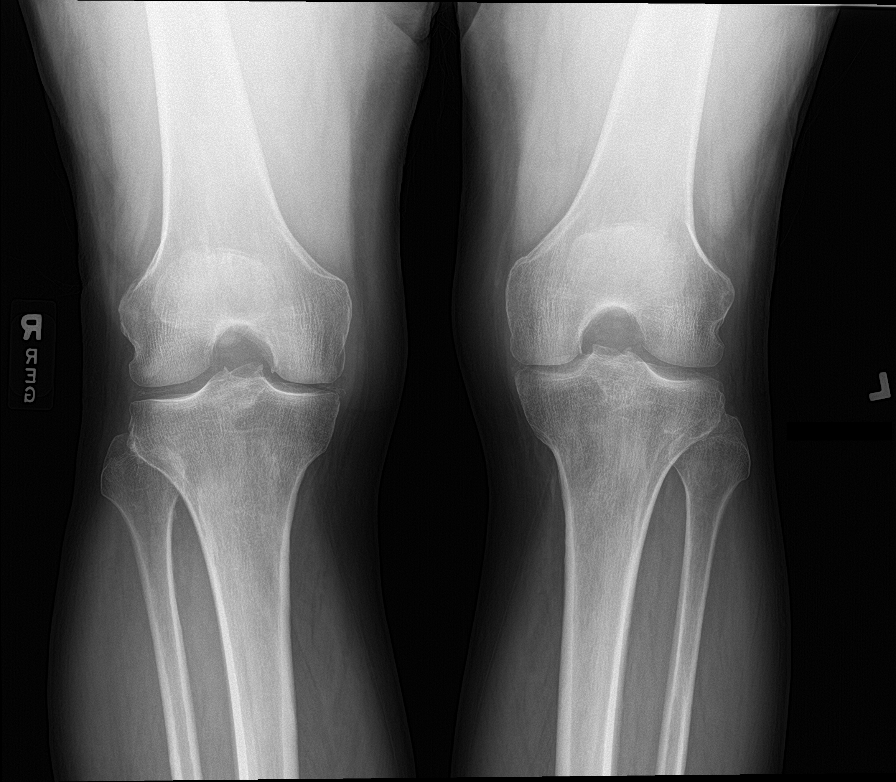

[knee lat (1 of 2)]
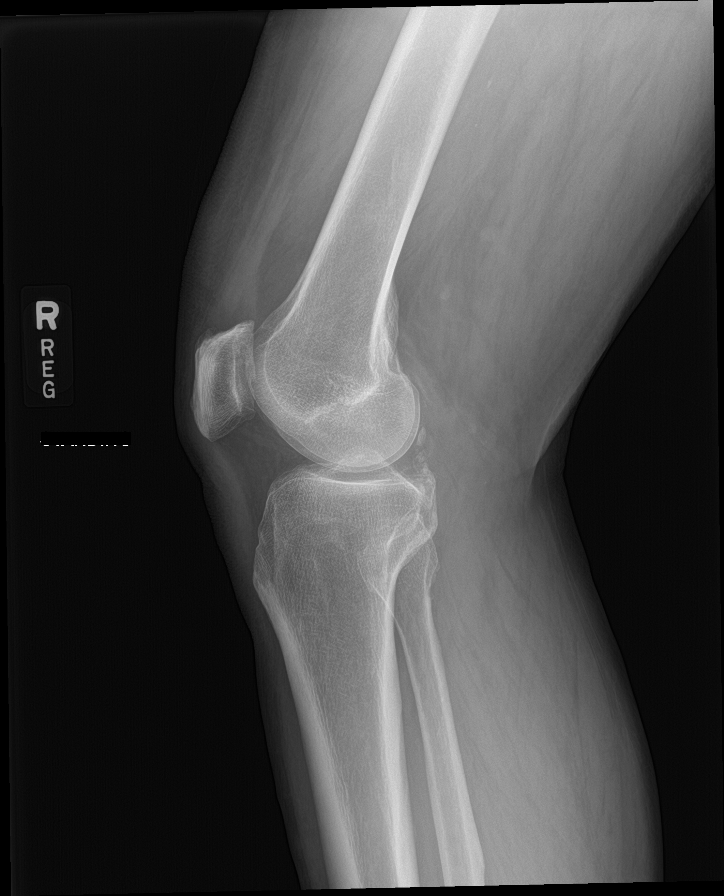

[knee sunrise (1 of 2)]
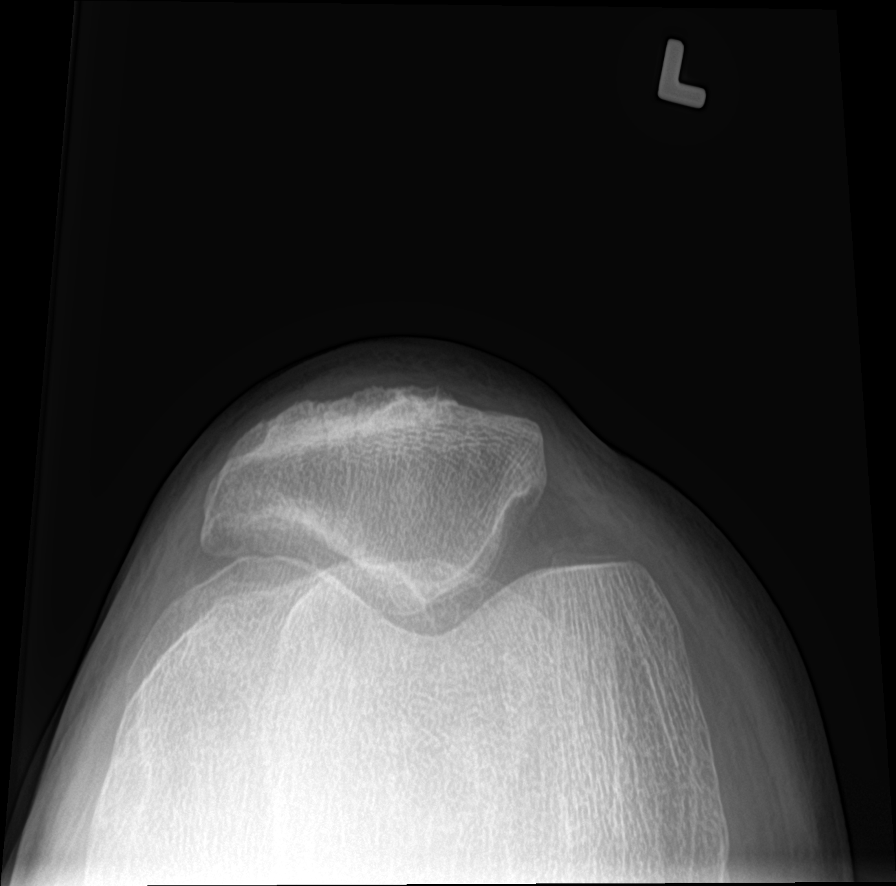

[knee ap bilat standing]
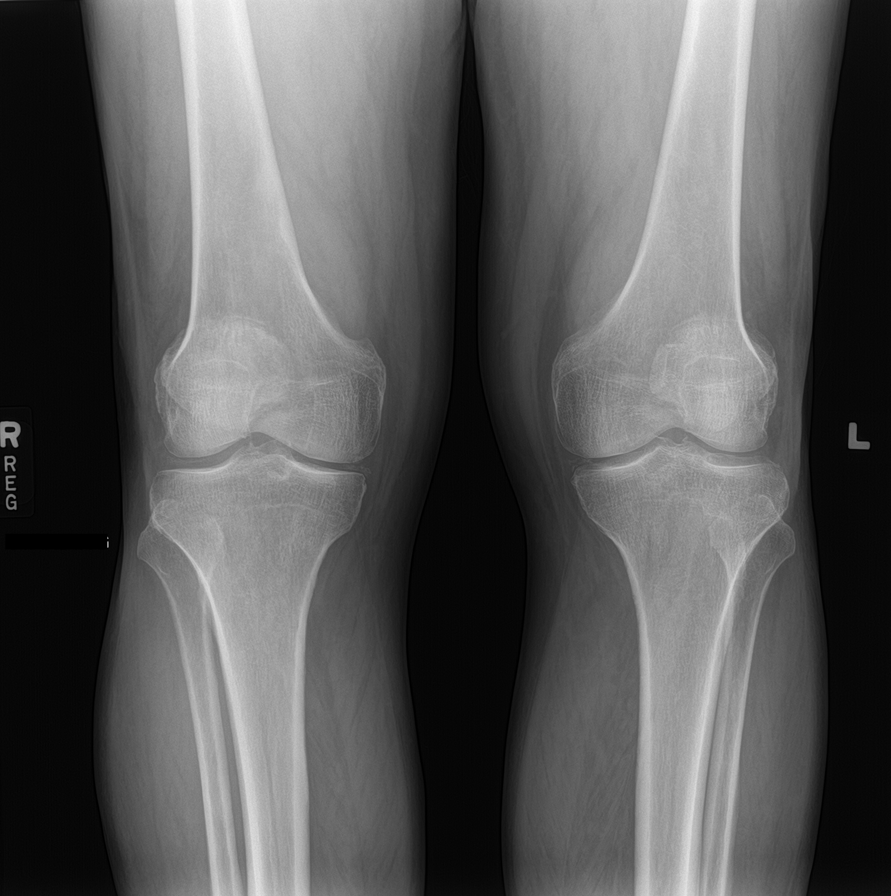

[knee lat (2 of 2)]
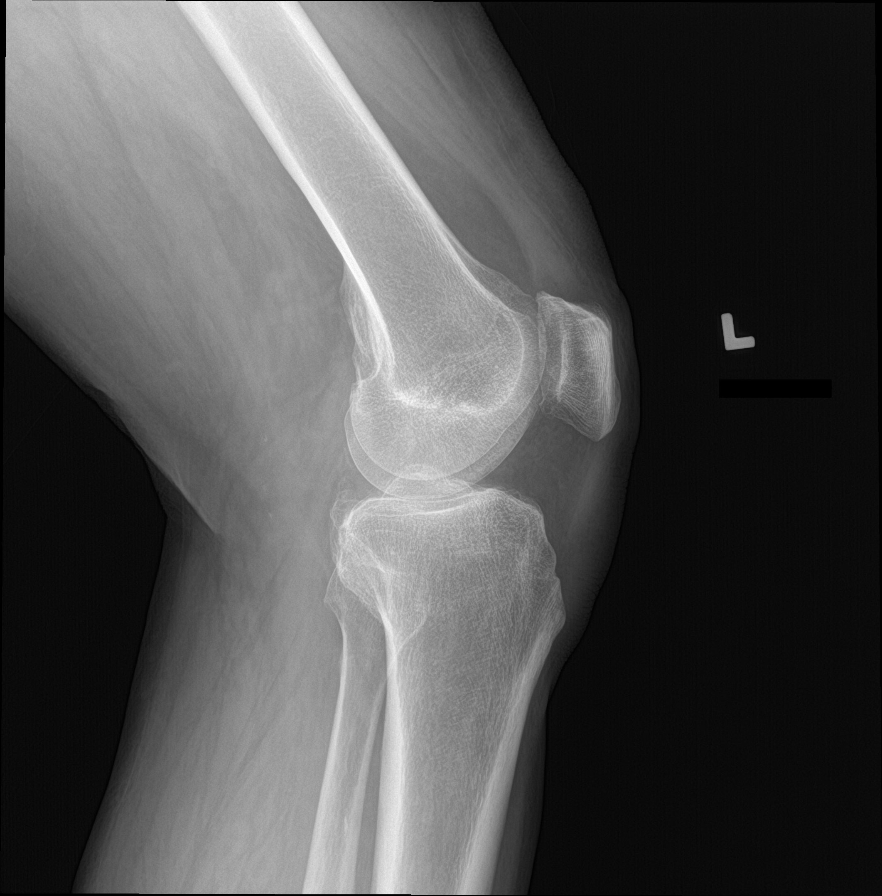

[knee sunrise (2 of 2)]
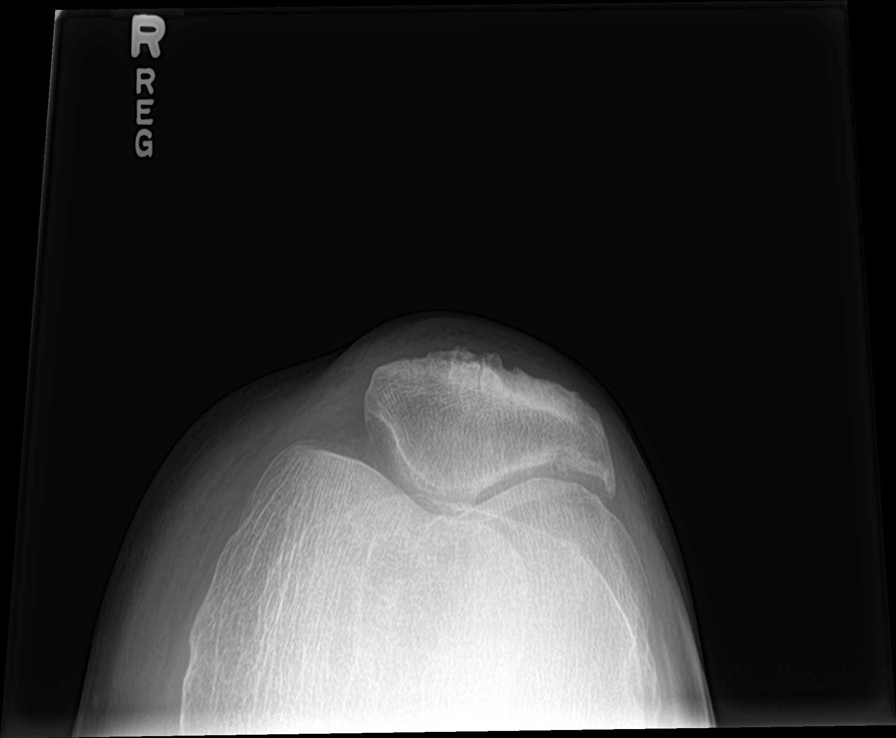

[6 of 6 positions shown; findings below may reference images not displayed]

FINDINGS: No fracture or dislocation of the bilateral knees. There is mild,
symmetric bilateral tricompartmental joint space narrowing without
significant arthrosis. There is bilateral chondrocalcinosis. No knee
joint effusion. Soft tissues are unremarkable.
IMPRESSION: 1. No fracture or dislocation of the bilateral knees.

2. There is mild, symmetric bilateral tricompartmental joint space
narrowing without significant arthrosis.

3. There is bilateral chondrocalcinosis, which can be seen in CPPD
(pseudogout).

## 2019-03-22 MED ORDER — TRAMADOL HCL 50 MG PO TABS: 50.0000 mg | ORAL_TABLET | Freq: Two times a day (BID) | ORAL | 0 refills | Status: DC

## 2019-03-22 NOTE — Assessment & Plan Note (Signed)
Persistent pain in spite of steroid injections, viscosupplementation, rehabilitation for greater than 6 weeks. Repeating x-rays today, bilateral MRIs hopefully scheduled for Sunday. Tramadol for use up to twice a day, if we do see meniscal tearing we will do another surgical referral.

## 2019-03-22 NOTE — Telephone Encounter (Signed)
The pt has been rescheduled to a sooner colon and pre visit due to worse diarrhea.  The pt wanted a Friday appt but no Friday appts are available for procedure or pre visit.  She was unhappy with the appt's but reluctantly agreed.  She also questions how the pt got changed to Dr Fuller Plan.  I tried to explain that the pt has always seen Dr Fuller Plan and had never been seen by Dr Ardis Hughs.  She states she would also like to know what else the pt can do for now.  The cholestyramine is not working.  He is taking 4 g daily.  I advised her that I would send a message to Janett Billow and ask for further recommendations.  She asked if "this Dr Fuller Plan" is in the picture.  I advised her that he is involved and I had to send the message to Alonza Bogus PA who is the last person to see the pt in the office.  She is requesting to increase the cholestyramine. Please advise

## 2019-03-22 NOTE — Progress Notes (Signed)
Subjective:    CC: Bilateral knee pain  HPI: This is a pleasant 73 year old male, he has bilateral knee pain, he has failed steroid injections, activity modification, therapy, viscosupplementation.  We sent him to surgery and it sounds as though he was not deemed a surgical candidate, continues to have pain, never got his MRI.  Symptoms are moderate, persistent, localized at the medial joint lines under the patella without radiation.  No trauma, no new mechanical symptoms.  I reviewed the past medical history, family history, social history, surgical history, and allergies today and no changes were needed.  Please see the problem list section below in epic for further details.  Past Medical History: Past Medical History:  Diagnosis Date  . Allergy    seasonal  . Anxiety   . Diabetes mellitus   . GERD (gastroesophageal reflux disease)   . Hypertension   . Sleep apnea    Past Surgical History: Past Surgical History:  Procedure Laterality Date  . [place]   left  . CHOLECYSTECTOMY  1990  . ERCP  1990  . Hammer Toe Repair Right 09/16/2016   RT #5  . Partial Excision Phalanx Left 09/16/2016   LT#5  . TONSILLECTOMY AND ADENOIDECTOMY  1998   and sinus for sleep apnea   Social History: Social History   Socioeconomic History  . Marital status: Married    Spouse name: Not on file  . Number of children: Not on file  . Years of education: Not on file  . Highest education level: Not on file  Occupational History  . Not on file  Social Needs  . Financial resource strain: Not on file  . Food insecurity    Worry: Not on file    Inability: Not on file  . Transportation needs    Medical: Not on file    Non-medical: Not on file  Tobacco Use  . Smoking status: Current Every Day Smoker    Packs/day: 0.30    Years: 50.00    Pack years: 15.00    Types: Cigarettes  . Smokeless tobacco: Never Used  Substance and Sexual Activity  . Alcohol use: No  . Drug use: No  .  Sexual activity: Not on file  Lifestyle  . Physical activity    Days per week: Not on file    Minutes per session: Not on file  . Stress: Not on file  Relationships  . Social Herbalist on phone: Not on file    Gets together: Not on file    Attends religious service: Not on file    Active member of club or organization: Not on file    Attends meetings of clubs or organizations: Not on file    Relationship status: Not on file  Other Topics Concern  . Not on file  Social History Narrative  . Not on file   Family History: Family History  Problem Relation Age of Onset  . Stroke Brother   . Colitis Brother   . Pancreatic cancer Mother   . Colitis Sister    Allergies: No Known Allergies Medications: See med rec.  Review of Systems: No fevers, chills, night sweats, weight loss, chest pain, or shortness of breath.   Objective:    General: Well Developed, well nourished, and in no acute distress.  Neuro: Alert and oriented x3, extra-ocular muscles intact, sensation grossly intact.  HEENT: Normocephalic, atraumatic, pupils equal round reactive to light, neck supple, no masses, no lymphadenopathy,  thyroid nonpalpable.  Skin: Warm and dry, no rashes. Cardiac: Regular rate and rhythm, no murmurs rubs or gallops, no lower extremity edema.  Respiratory: Clear to auscultation bilaterally. Not using accessory muscles, speaking in full sentences. Bilateral knees: Normal to inspection with no erythema or effusion or obvious bony abnormalities. Tender palpation of the patellar facets and the joint lines without radiation. ROM normal in flexion and extension and lower leg rotation. Ligaments with solid consistent endpoints including ACL, PCL, LCL, MCL. Negative Mcmurray's and provocative meniscal tests. Non painful patellar compression. Patellar and quadriceps tendons unremarkable. Hamstring and quadriceps strength is normal.  Impression and Recommendations:    Primary  osteoarthritis of both knees Persistent pain in spite of steroid injections, viscosupplementation, rehabilitation for greater than 6 weeks. Repeating x-rays today, bilateral MRIs hopefully scheduled for Sunday. Tramadol for use up to twice a day, if we do see meniscal tearing we will do another surgical referral.   ___________________________________________ Gwen Her. Dianah Field, M.D., ABFM., CAQSM. Primary Care and Sports Medicine  [facility]  Adjunct Professor of [employer] [facility] [employer]

## 2019-03-22 NOTE — Telephone Encounter (Signed)
The pt's wife has been advised and will try the questran BID.  If this does not help over the weekend she will call and try the lomotil

## 2019-03-22 NOTE — Telephone Encounter (Signed)
Can offer to increase the Questran to twice daily or else send a prescription for Lomotil every 6 hours as needed #30 refill 1.  Likely they are the ones who chose the date initially to begin with so I am not sure why they chose to postpone it so far out.  Yes, Dr. Fuller Plan is involved as he will be the one doing the colonoscopy and he saw my note from when they were in the office.  We see and manage patients with complaints of diarrhea every day.  At this point we do not have an exact diagnosis so we are just trying to manage the diarrhea.  Thus the purpose of the colonoscopy is to see if we do identify any other particular source.   Thank you,  Jess

## 2019-03-22 NOTE — Telephone Encounter (Signed)
Pt's wife [name] called requesting that pt's procedure be r/s to an earlier day or pt be seen in the office today. Pt is scheduled  For a colon on 12/11. Pt's wife states that his sxs have worsened to the point hat pt cannot get out of the house. She states that power that he was prescribed has not helped him. Pls call pt or his wife.

## 2019-03-25 ENCOUNTER — Telehealth: Payer: Self-pay | Admitting: *Deleted

## 2019-03-25 NOTE — Telephone Encounter (Signed)
Patient is on Plavix, he is for [facility] tomorrow and colon on 11/30 with Dr.Stark. He had an office visit with Janett Billow but I cannot find if this was addressed. We need hold information. Please review. Thanks! Robbin pv

## 2019-03-26 ENCOUNTER — Encounter: Payer: Self-pay | Admitting: Gastroenterology

## 2019-03-26 ENCOUNTER — Other Ambulatory Visit: Payer: Self-pay

## 2019-03-26 ENCOUNTER — Ambulatory Visit (AMBULATORY_SURGERY_CENTER): Payer: Self-pay

## 2019-03-26 VITALS — Temp 98.2°F | Ht 72.0 in | Wt 232.8 lb

## 2019-03-26 DIAGNOSIS — Z86010 Personal history of colonic polyps: Secondary | ICD-10-CM

## 2019-03-26 NOTE — Telephone Encounter (Signed)
Left message on machine to call back  

## 2019-03-26 NOTE — Progress Notes (Signed)
Denies allergies to eggs or soy products. Denies complication of anesthesia or sedation. Denies use of weight loss medication. Denies use of O2.   Emmi instructions given for colonoscopy.  Covid screening is scheduled for Tuesday 04/02/19 @ 1:10 Pm.

## 2019-03-29 DIAGNOSIS — Z961 Presence of intraocular lens: Secondary | ICD-10-CM | POA: Diagnosis not present

## 2019-03-29 DIAGNOSIS — Z9889 Other specified postprocedural states: Secondary | ICD-10-CM | POA: Diagnosis not present

## 2019-03-29 DIAGNOSIS — H2589 Other age-related cataract: Secondary | ICD-10-CM | POA: Diagnosis not present

## 2019-03-29 DIAGNOSIS — Z794 Long term (current) use of insulin: Secondary | ICD-10-CM | POA: Diagnosis not present

## 2019-03-29 DIAGNOSIS — E119 Type 2 diabetes mellitus without complications: Secondary | ICD-10-CM | POA: Diagnosis not present

## 2019-03-29 LAB — HM DIABETES EYE EXAM

## 2019-03-30 ENCOUNTER — Other Ambulatory Visit: Payer: Self-pay | Admitting: Osteopathic Medicine

## 2019-04-01 DIAGNOSIS — E1165 Type 2 diabetes mellitus with hyperglycemia: Secondary | ICD-10-CM | POA: Diagnosis not present

## 2019-04-01 NOTE — Telephone Encounter (Signed)
I am not the prescriber and this patient hasn't seen me in over a year please confirm prescriber with patient and ask that person, sorry!

## 2019-04-01 NOTE — Telephone Encounter (Signed)
Kristopher Oppenheim requesting med refill for dutasteride. Written by historical provider. Pls advise, thanks.

## 2019-04-01 NOTE — Telephone Encounter (Signed)
Anti coag letter sent to Dr's Hartford Poli and Sheppard Coil as well as a message left on the pt phone to confirm who prescribes plavix.

## 2019-04-02 ENCOUNTER — Other Ambulatory Visit: Payer: Self-pay | Admitting: Gastroenterology

## 2019-04-02 ENCOUNTER — Ambulatory Visit (INDEPENDENT_AMBULATORY_CARE_PROVIDER_SITE_OTHER): Payer: Medicare HMO

## 2019-04-02 DIAGNOSIS — Z1159 Encounter for screening for other viral diseases: Secondary | ICD-10-CM

## 2019-04-02 NOTE — Telephone Encounter (Signed)
Tried the mobile number and line rings then goes to fast busy.

## 2019-04-02 NOTE — Telephone Encounter (Signed)
Dr Fuller Plan I have tried several times to reach this pt on all numbers listed in EPIC to find out who prescribes Plavix to get the ok to hold prior to the 11/30 procedure.  I have not received any response from the pt.  I sent letters to all providers listed in Epic associated with the pt.  I received one response from Dr Sheppard Coil who states she has not seen the pt in over a year and does not prescribe plavix.  Just an FYI.

## 2019-04-02 NOTE — Telephone Encounter (Signed)
I spoke with the wife finally received a call back.  She says Dr Hartford Poli is the prescriber of the plavix.  I did send a letter to that office with no response.  The wife will be calling that office as well.  She is aware if we do not get a response for the plavix hold the procedure will need to be cancelled.

## 2019-04-02 NOTE — Telephone Encounter (Signed)
Please reschedule colonoscopy and have him reestablish with an appropriate physician to monitor, prescribe and provide procedure clearance for Plavix.

## 2019-04-02 NOTE — Telephone Encounter (Signed)
Left message on machine to call back  

## 2019-04-02 NOTE — Telephone Encounter (Signed)
Roslynn Amble with Dr Shirlyn Goltz office called states Dr Hartford Poli is not in today but will be in clinic in the morning.  She is going to speak to him first thing and call us tomorrow before 1pm.

## 2019-04-03 LAB — SARS CORONAVIRUS 2 (TAT 6-24 HRS): SARS Coronavirus 2: NEGATIVE

## 2019-04-03 NOTE — Telephone Encounter (Signed)
Roslynn Amble called and gave verbal ok to stop Plavix starting today until his procedure.  She is also going to fax over letter.

## 2019-04-03 NOTE — Telephone Encounter (Signed)
I have tried to call the pt on all available numbers to notify it is ok to hold plavix from today until procedure.  No answer and voice mail full.  Other line rings but goes to busy.  Will send message via My Chart

## 2019-04-07 ENCOUNTER — Other Ambulatory Visit: Payer: Medicare HMO

## 2019-04-08 ENCOUNTER — Other Ambulatory Visit: Payer: Self-pay

## 2019-04-08 ENCOUNTER — Telehealth: Payer: Self-pay | Admitting: Gastroenterology

## 2019-04-08 ENCOUNTER — Ambulatory Visit (AMBULATORY_SURGERY_CENTER): Payer: Medicare HMO | Admitting: Gastroenterology

## 2019-04-08 ENCOUNTER — Encounter: Payer: Self-pay | Admitting: Gastroenterology

## 2019-04-08 VITALS — BP 133/79 | HR 60 | Temp 98.8°F | Resp 17 | Ht 72.0 in | Wt 232.0 lb

## 2019-04-08 DIAGNOSIS — D122 Benign neoplasm of ascending colon: Secondary | ICD-10-CM

## 2019-04-08 DIAGNOSIS — K921 Melena: Secondary | ICD-10-CM | POA: Diagnosis not present

## 2019-04-08 DIAGNOSIS — K5140 Inflammatory polyps of colon without complications: Secondary | ICD-10-CM | POA: Diagnosis not present

## 2019-04-08 DIAGNOSIS — K635 Polyp of colon: Secondary | ICD-10-CM

## 2019-04-08 DIAGNOSIS — D125 Benign neoplasm of sigmoid colon: Secondary | ICD-10-CM

## 2019-04-08 DIAGNOSIS — D128 Benign neoplasm of rectum: Secondary | ICD-10-CM | POA: Diagnosis not present

## 2019-04-08 DIAGNOSIS — D123 Benign neoplasm of transverse colon: Secondary | ICD-10-CM

## 2019-04-08 DIAGNOSIS — K640 First degree hemorrhoids: Secondary | ICD-10-CM

## 2019-04-08 DIAGNOSIS — R197 Diarrhea, unspecified: Secondary | ICD-10-CM | POA: Diagnosis not present

## 2019-04-08 DIAGNOSIS — Z86010 Personal history of colonic polyps: Secondary | ICD-10-CM | POA: Diagnosis not present

## 2019-04-08 MED ORDER — SODIUM CHLORIDE 0.9 % IV SOLN: 500.0000 mL | Freq: Once | INTRAVENOUS | Status: DC

## 2019-04-08 NOTE — Progress Notes (Signed)
Called to room to assist during endoscopic procedure.  Patient ID and intended procedure confirmed with present staff. Received instructions for my participation in the procedure from the performing physician.  

## 2019-04-08 NOTE — Progress Notes (Signed)
PT taken to PACU. Monitors in place. VSS. Report given to RN. 

## 2019-04-08 NOTE — Progress Notes (Signed)
Pt's states no medical or surgical changes since previsit or office visit. 

## 2019-04-08 NOTE — Patient Instructions (Signed)
Information on polyps and hemorrhoids given to you today.  Await pathology results.  Resume Plavix in 3 days at previous dose.  Return to GI clinic in 1 month.  YOU HAD AN ENDOSCOPIC PROCEDURE TODAY AT [facility]:   Refer to the procedure report that was given to you for any specific questions about what was found during the examination.  If the procedure report does not answer your questions, please call your gastroenterologist to clarify.  If you requested that your care partner not be given the details of your procedure findings, then the procedure report has been included in a sealed envelope for you to review at your convenience later.  YOU SHOULD EXPECT: Some feelings of bloating in the abdomen. Passage of more gas than usual.  Walking can help get rid of the air that was put into your GI tract during the procedure and reduce the bloating. If you had a lower endoscopy (such as a colonoscopy or flexible sigmoidoscopy) you may notice spotting of blood in your stool or on the toilet paper. If you underwent a bowel prep for your procedure, you may not have a normal bowel movement for a few days.  Please Note:  You might notice some irritation and congestion in your nose or some drainage.  This is from the oxygen used during your procedure.  There is no need for concern and it should clear up in a day or so.  SYMPTOMS TO REPORT IMMEDIATELY:   Following lower endoscopy (colonoscopy or flexible sigmoidoscopy):  Excessive amounts of blood in the stool  Significant tenderness or worsening of abdominal pains  Swelling of the abdomen that is new, acute  Fever of 100F or higher      For urgent or emergent issues, a gastroenterologist can be reached at any hour by calling [phone].   DIET:  We do recommend a small meal at first, but then you may proceed to your regular diet.  Drink plenty of fluids but you should avoid alcoholic beverages for 24 hours.  ACTIVITY:  You  should plan to take it easy for the rest of today and you should NOT DRIVE or use heavy machinery until tomorrow (because of the sedation medicines used during the test).    FOLLOW UP: Our staff will call the number listed on your records 48-72 hours following your procedure to check on you and address any questions or concerns that you may have regarding the information given to you following your procedure. If we do not reach you, we will leave a message.  We will attempt to reach you two times.  During this call, we will ask if you have developed any symptoms of COVID 19. If you develop any symptoms (ie: fever, flu-like symptoms, shortness of breath, cough etc.) before then, please call [phone].  If you test positive for Covid 19 in the 2 weeks post procedure, please call and report this information to [place].    If any biopsies were taken you will be contacted by phone or by letter within the next 1-3 weeks.  Please call us at [phone] if you have not heard about the biopsies in 3 weeks.    SIGNATURES/CONFIDENTIALITY: You and/or your care partner have signed paperwork which will be entered into your electronic medical record.  These signatures attest to the fact that that the information above on your After Visit Summary has been reviewed and is understood.  Full responsibility of the confidentiality of this discharge  information lies with you and/or your care-partner. 

## 2019-04-08 NOTE — Op Note (Signed)
[facility] Patient Name: Benjamin Powell Procedure Date: 04/08/2019 3:07 PM MRN: [id number] Endoscopist: Ladene Artist , MD Age: 73 Referring MD:  Date of Birth: August 22, 1945 Gender: Male Account #: [id number] Procedure:                Colonoscopy Indications:              Chronic diarrhea, Hematochezia Medicines:                Monitored Anesthesia Care Procedure:                Pre-Anesthesia Assessment:                           - Prior to the procedure, a History and Physical                            was performed, and patient medications and                            allergies were reviewed. The patient's tolerance of                            previous anesthesia was also reviewed. The risks                            and benefits of the procedure and the sedation                            options and risks were discussed with the patient.                            All questions were answered, and informed consent                            was obtained. Prior Anticoagulants: The patient has                            taken Plavix (clopidogrel), last dose was 5 days                            prior to procedure. ASA Grade Assessment: III - A                            patient with severe systemic disease. After                            reviewing the risks and benefits, the patient was                            deemed in satisfactory condition to undergo the                            procedure.  After obtaining informed consent, the colonoscope                            was passed under direct vision. Throughout the                            procedure, the patient's blood pressure, pulse, and                            oxygen saturations were monitored continuously. The                            Colonoscope was introduced through the anus and                            advanced to the the terminal ileum, with       identification of the appendiceal orifice and IC                            valve. The terminal ileum, ileocecal valve,                            appendiceal orifice, and rectum were photographed.                            The quality of the bowel preparation was good. The                            colonoscopy was performed without difficulty. The                            patient tolerated the procedure well. Scope In: 3:30:58 PM Scope Out: 3:51:15 PM Scope Withdrawal Time: 0 hours 17 minutes 46 seconds  Total Procedure Duration: 0 hours 20 minutes 17 seconds  Findings:                 The perianal and digital rectal examinations were                            normal.                           The terminal ileum appeared normal.                           Multiple clusters of numerous sessile polyps were                            found in the transverse colon and ascending colon.                            The polyps were 4 to 6 mm in size. Sampling                            biopsies were  taken with a cold forceps for                            histology.                           Five sessile polyps were found in the rectum,                            sigmoid colon, transverse colon and ascending colon                            (2). The polyps were 7 to 9 mm in size. These                            polyps were removed with a cold snare. Resection                            and retrieval were complete.                           A 10 mm polyp was found in the rectum. The polyp                            was pedunculated. The polyp was removed with a hot                            snare. Resection and retrieval were complete.                           Internal hemorrhoids were found during                            retroflexion. The hemorrhoids were medium-sized and                            Grade I (internal hemorrhoids that do not prolapse).                           The  exam was otherwise without abnormality on                            direct and retroflexion views. Random biopsies                            taken for diarrhea evaluation. Complications:            No immediate complications. Estimated blood loss:                            None. Estimated Blood Loss:     Estimated blood loss: none. Impression:               - The examined portion of the ileum was normal.                           -  Multiple clusters of numerous 4 to 6 mm polyps in                            the transverse colon and in the ascending colon.                            Sampling biopsies.                           - Five 7 to 9 mm polyps in the rectum, in the                            sigmoid colon, in the transverse colon and in the                            ascending colon, removed with a cold snare.                            Resected and retrieved.                           - One 10 mm polyp in the rectum, removed with a hot                            snare. Resected and retrieved.                           - Internal hemorrhoids.                           - The examination was otherwise normal on direct                            and retroflexion views. Random biopsies. Recommendation:           - Repeat colonoscopy after studies are complete for                            surveillance based on pathology results.                           - Resume Plavix (clopidogrel) in 3 days at prior                            dose. Refer to managing physician for further                            adjustment of therapy.                           - Patient has a contact number available for                            emergencies. The signs and symptoms of potential  delayed complications were discussed with the                            patient. Return to normal activities tomorrow.                            Written discharge instructions were provided  to the                            patient.                           - Resume previous diet.                           - Continue present medications.                           - Await pathology results.                           - Return to GI office in 1 month. Ladene Artist, MD 04/08/2019 3:59:28 PM This report has been signed electronically.

## 2019-04-08 NOTE — Telephone Encounter (Signed)
Spoke with wife who wanted to know why she wasn't called with a report after her husband's procedure.  This RN asked the patient if he wanted me to call his daughter or his wife and he asked me to call his daughter who was waiting in the parking lot.  Patient's wife is just frustrated because patient is experiencing so many bouts of diarrhea that he "has no quality of life and can't go anywhere".  She wanted to know if Dr. Fuller Plan had any idea about the diarrhea and what to do.  This RN explained that biopsies were taken to evaluate the cause and that when we receive the pathology, then Dr. Fuller Plan will understand better how to proceed with the situation.  Patient's wife was agreeable to waiting for the pathology and knowing that [place] will be checking on her husband in several days.

## 2019-04-10 ENCOUNTER — Telehealth: Payer: Self-pay | Admitting: *Deleted

## 2019-04-10 NOTE — Telephone Encounter (Signed)
  Follow up Call-  Call back number 04/08/2019  Post procedure Call Back phone  # [phone]  Permission to leave phone message Yes  Some recent data might be hidden     Patient questions:  Do you have a fever, pain , or abdominal swelling? No. Pain Score  0 *  Have you tolerated food without any problems? Yes.    Have you been able to return to your normal activities? Yes.    Do you have any questions about your discharge instructions: Diet   No. Medications  No. Follow up visit  No.  Do you have questions or concerns about your Care? No.  Actions: * If pain score is 4 or above: 1. No action needed, pain <4.Have you developed a fever since your procedure? no  2.   Have you had an respiratory symptoms (SOB or cough) since your procedure? no  3.   Have you tested positive for COVID 19 since your procedure no  4.   Have you had any family members/close contacts diagnosed with the COVID 19 since your procedure?  no   If yes to any of these questions please route to Joylene John, RN and Alphonsa Gin, Therapist, sports.

## 2019-04-11 ENCOUNTER — Telehealth: Payer: Self-pay | Admitting: Gastroenterology

## 2019-04-11 NOTE — Telephone Encounter (Signed)
Pt's wife reported that pt is still experiencing diarrhea.  Please advise.

## 2019-04-12 NOTE — Telephone Encounter (Signed)
Left message for patient to call back  

## 2019-04-16 ENCOUNTER — Encounter: Payer: Self-pay | Admitting: Gastroenterology

## 2019-04-17 ENCOUNTER — Telehealth: Payer: Self-pay | Admitting: Gastroenterology

## 2019-04-17 DIAGNOSIS — R197 Diarrhea, unspecified: Secondary | ICD-10-CM

## 2019-04-17 NOTE — Telephone Encounter (Signed)
Send fecal elastase Increase Questran to 1 pkt po bid Imodium bid prn Lactose free diet for 1 week and if not effective then FODMAP diet Schedule REV with me or APP

## 2019-04-17 NOTE — Telephone Encounter (Signed)
Left message on machine to call back  

## 2019-04-17 NOTE — Telephone Encounter (Signed)
Pt's wife [name] called back--she stated that she did not fully understand pathology results as discussed with Vaughan Basta.  She also reported that pt is experiencing diarrhea.

## 2019-04-17 NOTE — Telephone Encounter (Signed)
Pt did not return call. 

## 2019-04-17 NOTE — Telephone Encounter (Signed)
Pts wife called about path results from Colon, path result letter discussed with her over the phone. Wife states pt is still having lots of diarrhea and she wants to know what they need to do regarding his symptoms. Please advise.

## 2019-04-18 ENCOUNTER — Encounter: Payer: Self-pay | Admitting: Sports Medicine

## 2019-04-19 ENCOUNTER — Encounter: Payer: Medicare HMO | Admitting: Gastroenterology

## 2019-04-19 NOTE — Telephone Encounter (Signed)
Left message for patient to call back  

## 2019-04-22 MED ORDER — CHOLESTYRAMINE 4 G PO PACK: 4.0000 g | PACK | Freq: Two times a day (BID) | ORAL | 1 refills | Status: DC

## 2019-04-22 NOTE — Telephone Encounter (Signed)
Wife notified of recommendations He will come in and see Benjamin Bogus, PA on 04/25/19 3:30.  He is asked to come for the stool studies prior to the appt.

## 2019-04-22 NOTE — Telephone Encounter (Signed)
Left message for patient to call back  

## 2019-04-24 ENCOUNTER — Other Ambulatory Visit: Payer: Self-pay | Admitting: Physician Assistant

## 2019-04-24 DIAGNOSIS — I10 Essential (primary) hypertension: Secondary | ICD-10-CM

## 2019-04-25 ENCOUNTER — Ambulatory Visit: Payer: Medicare HMO | Admitting: Gastroenterology

## 2019-04-26 ENCOUNTER — Other Ambulatory Visit: Payer: Self-pay

## 2019-04-26 DIAGNOSIS — M170 Bilateral primary osteoarthritis of knee: Secondary | ICD-10-CM

## 2019-04-26 MED ORDER — TRAMADOL HCL 50 MG PO TABS: 50.0000 mg | ORAL_TABLET | Freq: Two times a day (BID) | ORAL | 0 refills | Status: DC

## 2019-05-08 DIAGNOSIS — E1165 Type 2 diabetes mellitus with hyperglycemia: Secondary | ICD-10-CM | POA: Diagnosis not present

## 2019-05-20 ENCOUNTER — Encounter: Payer: Self-pay | Admitting: Gastroenterology

## 2019-05-20 NOTE — Telephone Encounter (Signed)
Error

## 2019-06-11 ENCOUNTER — Telehealth: Payer: Self-pay

## 2019-06-11 NOTE — Telephone Encounter (Signed)
I have not seen the patient in well over a year.  I will not order labs without an office visit.

## 2019-06-11 NOTE — Telephone Encounter (Signed)
Nurse from [facility] called stating that labs regarding diagnosis of kidney failure was not received by Dr. Tresa Endo office. Requesting for labs to be re-fax to [phone]. Attempted a call to [facility] for clarification of request. No answer or specific directory to speak with someone directly at the office. Nurse mentioned that wife is also inquiring about these labs. There is no info noted in pt's chart. Pls advise, thanks.

## 2019-06-12 NOTE — Telephone Encounter (Signed)
Spoke to on call Nurse - Stanton Kidney regarding yesterday's message. No record of call in their system. She will message Dr. Tresa Endo nurse for clarification whether call was directed to the correct facility. Nurse Stanton Kidney has also been informed of provider's note. Aware to have Dr. Rosana Hoes assistant to contact the clinic directly for inquiries. Direct call back info provided.

## 2019-06-13 ENCOUNTER — Telehealth: Payer: Self-pay | Admitting: General Practice

## 2019-06-13 ENCOUNTER — Telehealth: Payer: Self-pay | Admitting: Gastroenterology

## 2019-06-13 ENCOUNTER — Ambulatory Visit: Payer: Medicare HMO | Admitting: Sports Medicine

## 2019-06-13 DIAGNOSIS — M170 Bilateral primary osteoarthritis of knee: Secondary | ICD-10-CM

## 2019-06-13 NOTE — Telephone Encounter (Signed)
Patient calling in about tramadol medication. States they were not able to get refill that they requested a couple of days ago. Please advise.

## 2019-06-14 MED ORDER — TRAMADOL HCL 50 MG PO TABS: 50.0000 mg | ORAL_TABLET | Freq: Two times a day (BID) | ORAL | 0 refills | Status: DC

## 2019-06-14 NOTE — Telephone Encounter (Signed)
Patient wife called and is very upset that her husband has not got the Tramadol refilled. The wife is very irritated that the medication has not been sent to [facility]. Patient wife reports that the patient has been dealing with some GI issues and has not been able to get back in with the surgeon at this time.

## 2019-06-14 NOTE — Telephone Encounter (Addendum)
I did not say he could not have the tramadol lol, I specifically said if he is not interested in operative intervention then I would be happy to do the medical pain management. (which means tramadol).  There was no need for either party to get upset, it was simply a question as to what his plan was for treatment.

## 2019-06-14 NOTE — Telephone Encounter (Signed)
He was supposed to discuss this with orthopedic surgery, if he has decided not to proceed with operative intervention then yes I would be happy to do medical pain management with tramadol.

## 2019-06-14 NOTE — Telephone Encounter (Signed)
Left message for patient to call back  

## 2019-06-14 NOTE — Telephone Encounter (Signed)
Attempted to call patient and the phone line was disconnected.

## 2019-06-14 NOTE — Addendum Note (Signed)
Addended by: Silverio Decamp on: 06/14/2019 05:13 PM   Modules accepted: Orders

## 2019-06-14 NOTE — Telephone Encounter (Signed)
This patient saw you in November of last year. He is wanting to know if you can send in Tramadol to [facility]. He has follow up on 06/21/19 with you. Please advise.

## 2019-06-17 ENCOUNTER — Ambulatory Visit (INDEPENDENT_AMBULATORY_CARE_PROVIDER_SITE_OTHER): Payer: Medicare Other | Admitting: Sports Medicine

## 2019-06-17 ENCOUNTER — Encounter: Payer: Self-pay | Admitting: Sports Medicine

## 2019-06-17 ENCOUNTER — Ambulatory Visit (INDEPENDENT_AMBULATORY_CARE_PROVIDER_SITE_OTHER): Payer: Medicare Other

## 2019-06-17 ENCOUNTER — Other Ambulatory Visit: Payer: Self-pay

## 2019-06-17 DIAGNOSIS — M5136 Other intervertebral disc degeneration, lumbar region: Secondary | ICD-10-CM

## 2019-06-17 DIAGNOSIS — M51369 Other intervertebral disc degeneration, lumbar region without mention of lumbar back pain or lower extremity pain: Secondary | ICD-10-CM

## 2019-06-17 DIAGNOSIS — M170 Bilateral primary osteoarthritis of knee: Secondary | ICD-10-CM

## 2019-06-17 IMAGING — DX DG LUMBAR SPINE COMPLETE 4+V
5 series · 5 of 5 positions shown · non-contrast
Comparison: None.

CLINICAL DATA: Low back pain for the past 4 weeks.

EXAM:
LUMBAR SPINE - COMPLETE 4+ VIEW

[l-spine ap]
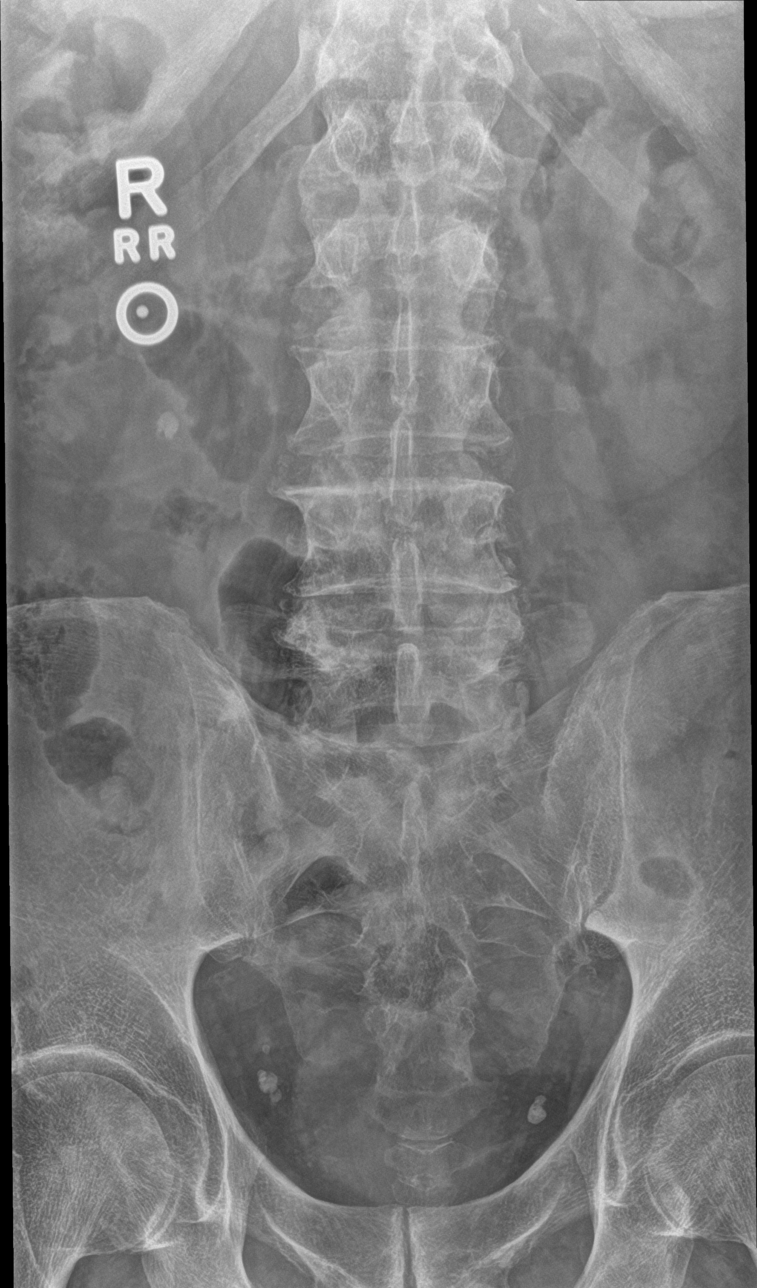

[l-spine obl (1 of 2)]
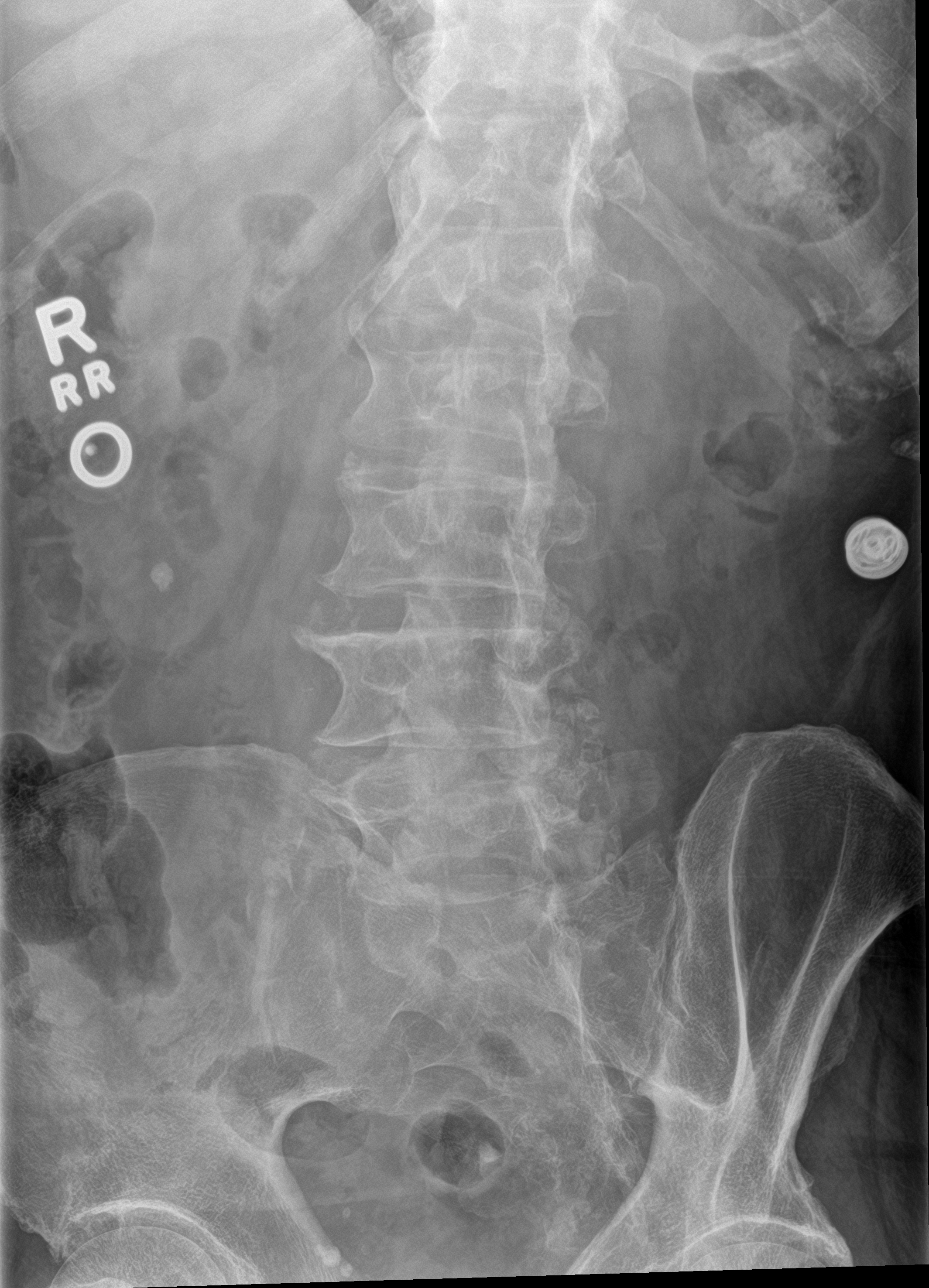

[l-spine obl (2 of 2)]
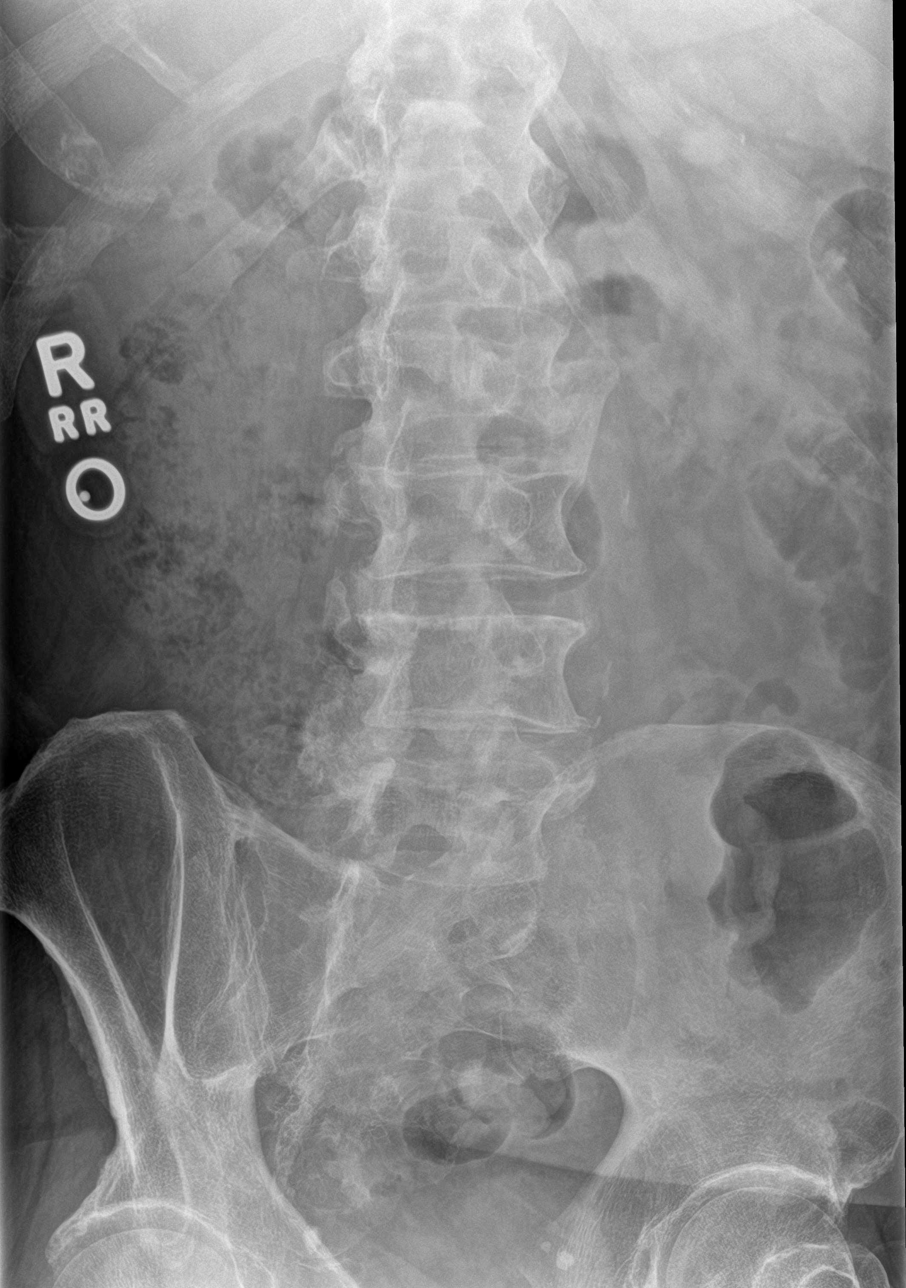

[l-spine lat]
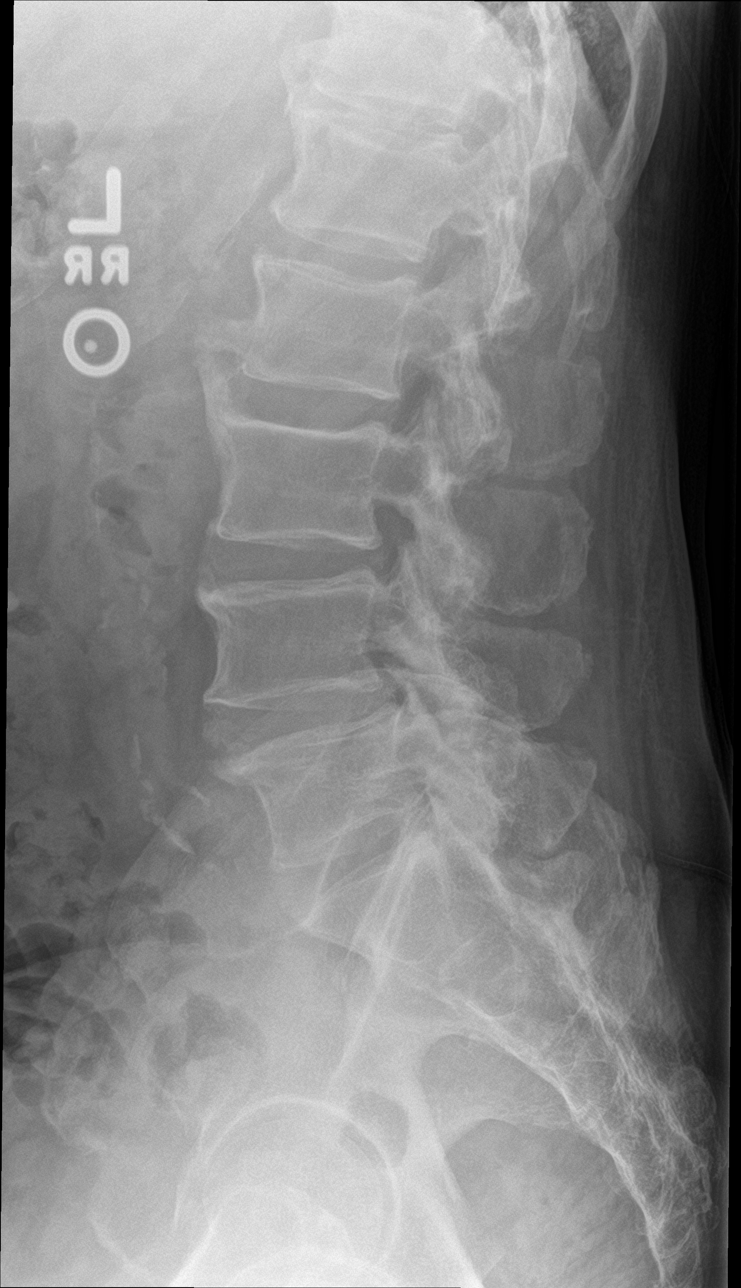

[l-spine spot]
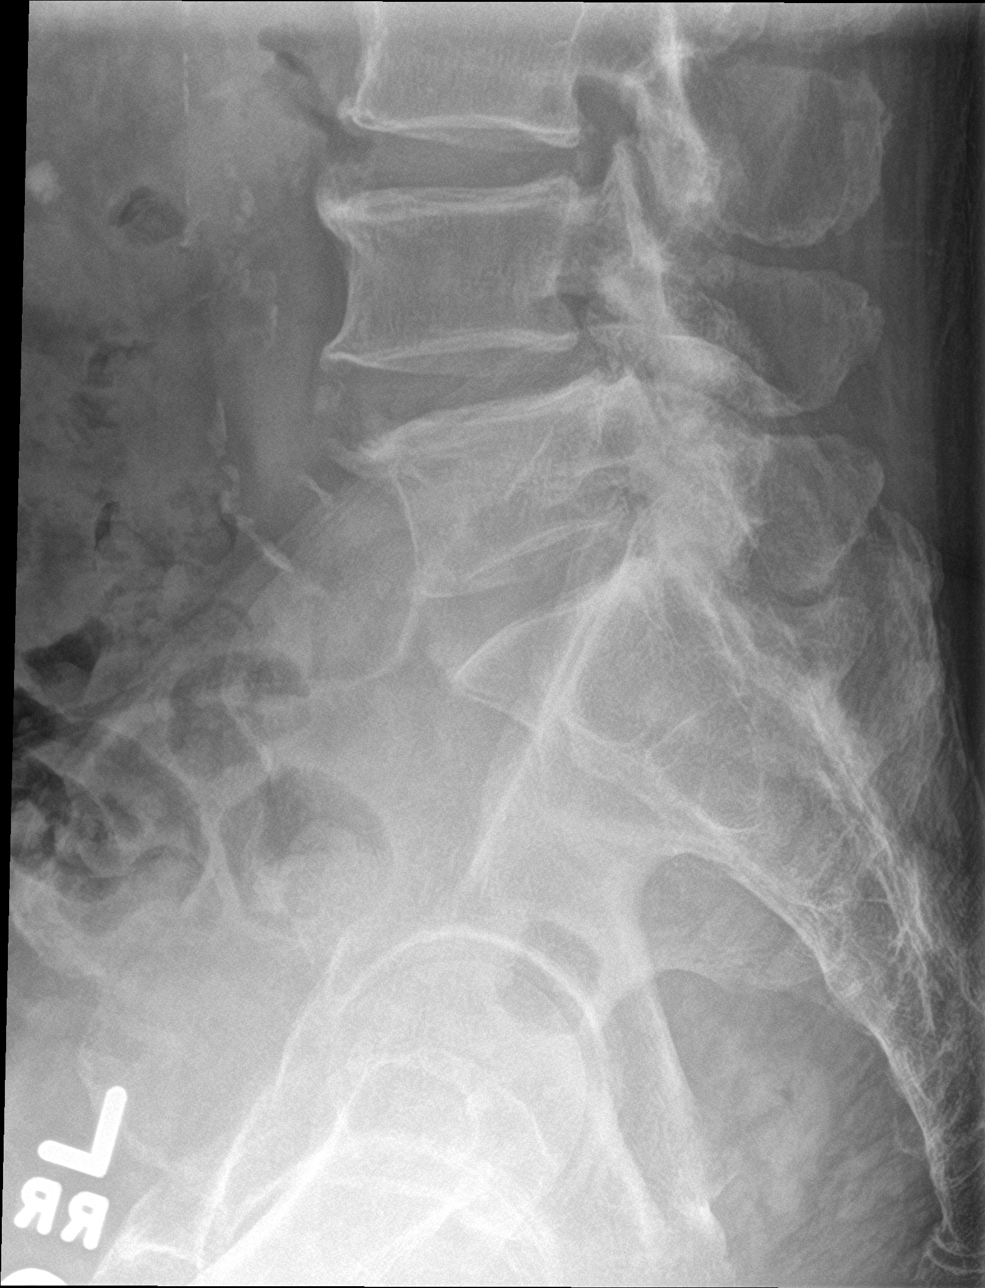

[5 of 5 positions shown; findings below may reference images not displayed]

FINDINGS: There are 5 non rib-bearing lumbar type vertebral bodies.

There is minimal anterolisthesis of L4 upon L5 measuring
approximately 4 mm. No retrolisthesis.

Lumbar vertebral body heights are preserved.

Mild to moderate multilevel lumbar spine DDD, worse at L4-L5 disc
space height loss, endplate irregularity and sclerosis.

Limited visualization of the bilateral SI joints and hips is normal.

Phleboliths overlie the lower pelvis bilaterally. Vascular
calcifications within the abdominal aorta and pelvic vasculature.

There is a punctate (approximately 0.8 cm) calcification overlying
the expected location of the right renal fossa.
IMPRESSION: 1. Mild-to-moderate multilevel lumbar spine DDD.
2. Suspected 0.8 cm right-sided renal stone - clinical correlation
is advised. Further evaluation with renal ultrasound and or
noncontrast CT scan of the abdomen and pelvis could be as indicated.
3.  Aortic Atherosclerosis ([03]-[03]).

## 2019-06-17 IMAGING — MR MR LUMBAR SPINE W/O CM
4 of 5 series · 26 of 48 positions shown · non-contrast
Comparison: Lumbar spine radiographs [DATE]

CLINICAL DATA: Low back pain for 5 weeks with difficulty having a
bowel movement for 3 weeks. Concern for cauda equina syndrome.

EXAM:
MRI LUMBAR SPINE WITHOUT CONTRAST
TECHNIQUE: Multiplanar, multisequence MR imaging of the lumbar spine was
performed. No intravenous contrast was administered.

[Series 4: T1 · sagittal · 4.0mm · 0.41mm/px · 6 of 17 slices shown (1 of 2)]
[im 1/17]
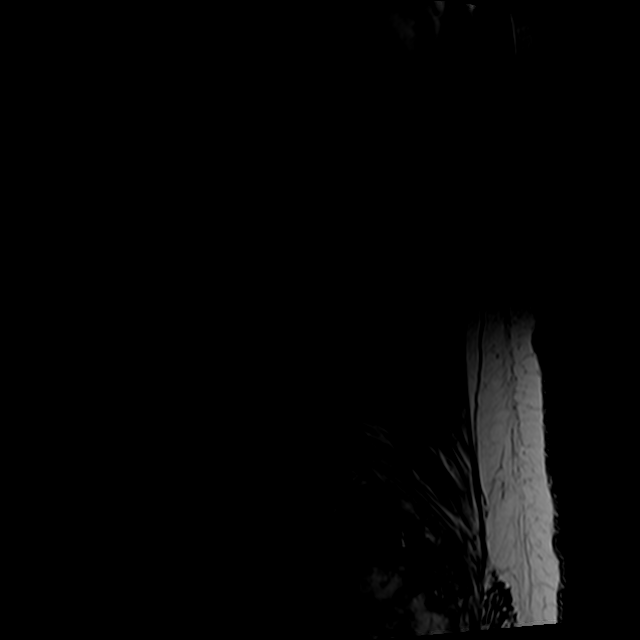
[im 4/17]
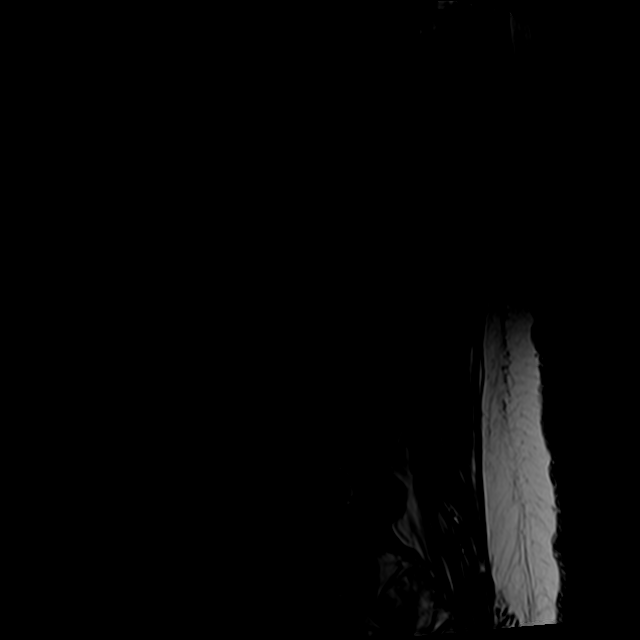
[im 7/17]
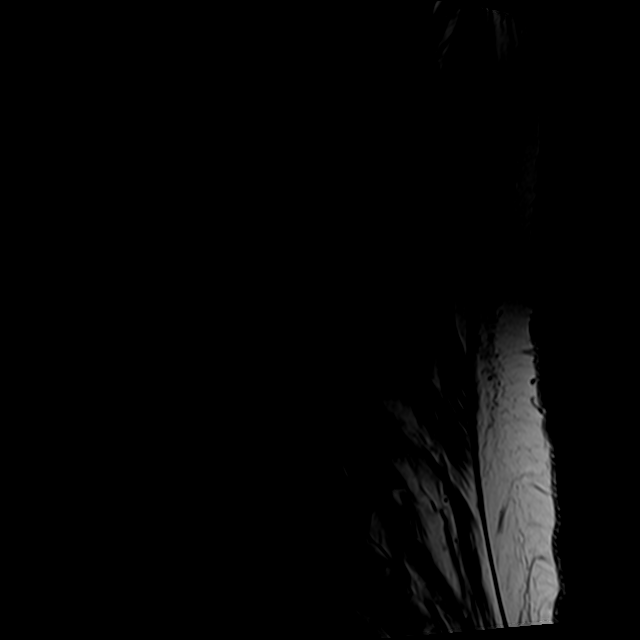
[im 10/17]
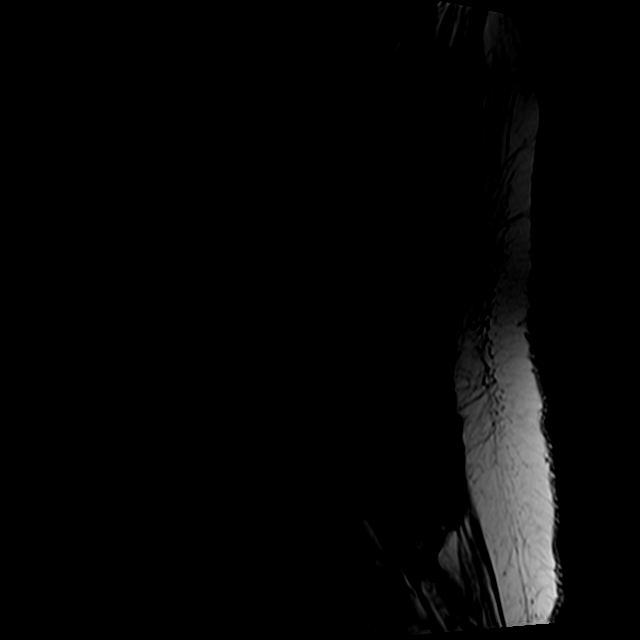
[im 13/17]
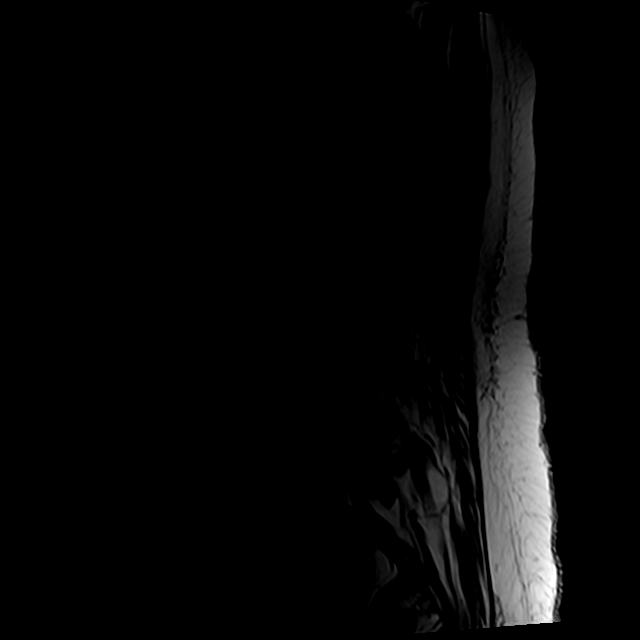
[im 17/17]
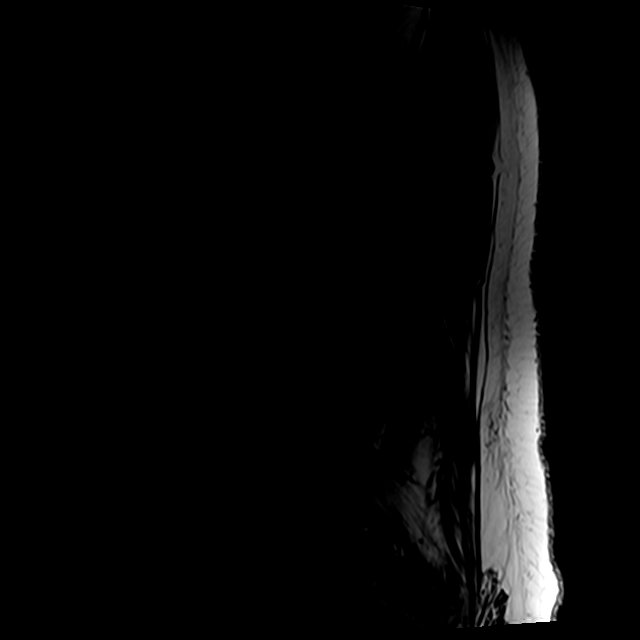

[Series 5: T2 · sagittal · 4.0mm · 0.81mm/px · 6 of 17 slices shown (1 of 2)]
[im 1/17]
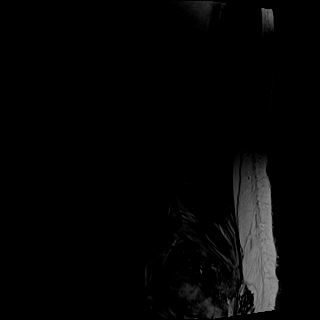
[im 4/17]
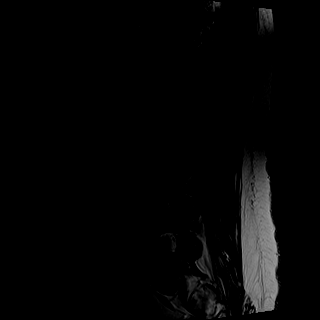
[im 7/17]
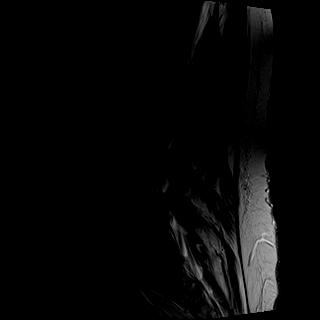
[im 10/17]
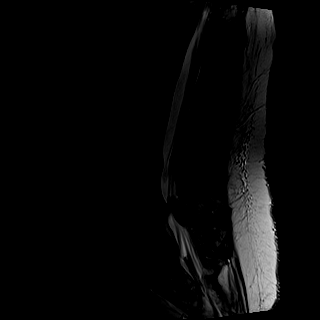
[im 13/17]
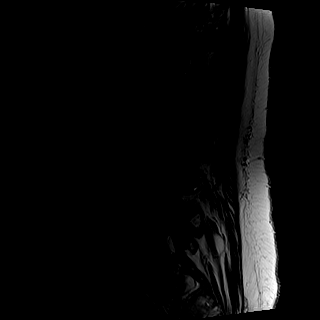
[im 17/17]
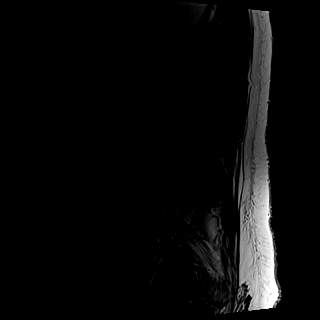

[Series 9: T2 · axial · 4.0mm · 0.78mm/px · z∈[-165,+94]mm · 9 of 44 slices shown (2 of 2)]
[im 1/44]
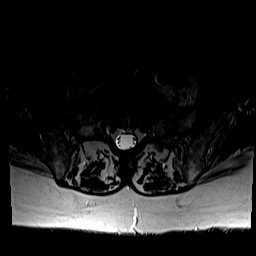
[im 7/44]
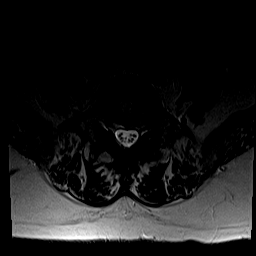
[im 13/44]
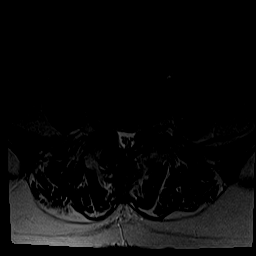
[im 19/44]
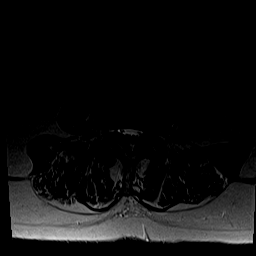
[im 22/44]
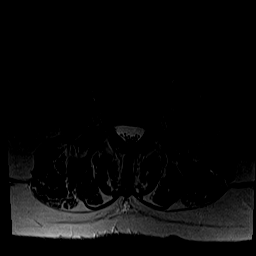
[im 25/44]
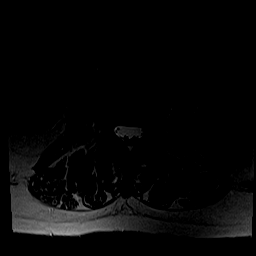
[im 31/44]
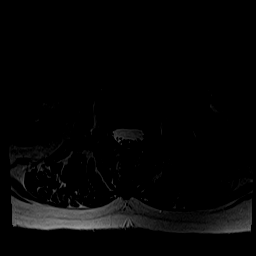
[im 37/44]
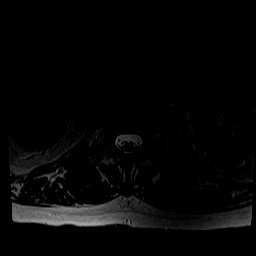
[im 44/44]
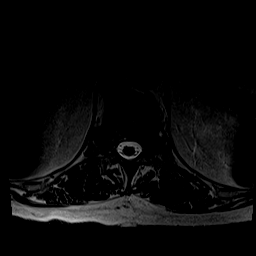

[Series 12: T1 · axial · 4.0mm · 0.39mm/px · z∈[-165,+60]mm · 5 of 44 slices shown (2 of 2)]
[im 1/44]
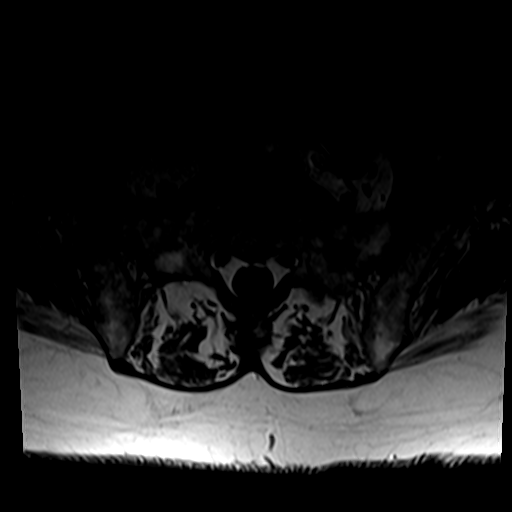
[im 7/44]
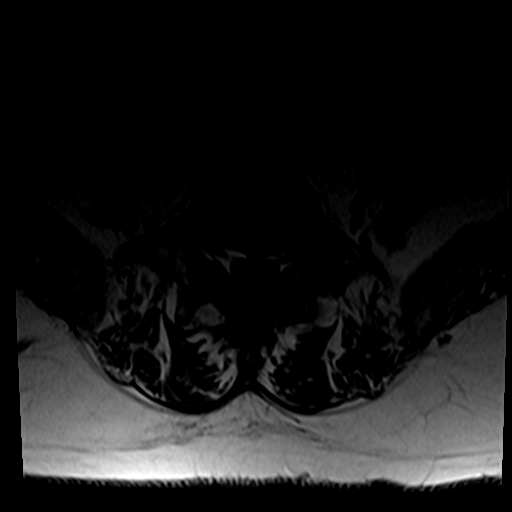
[im 13/44]
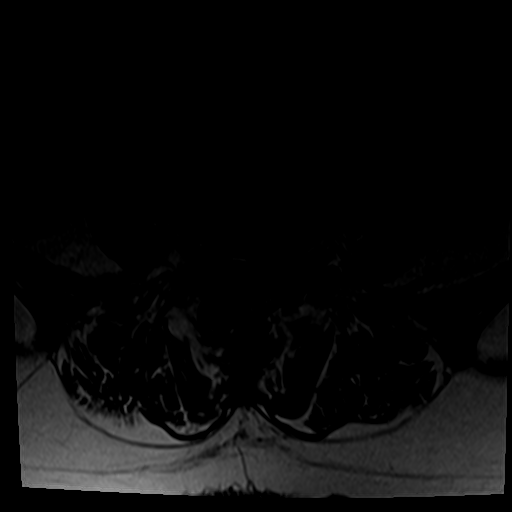
[im 22/44]
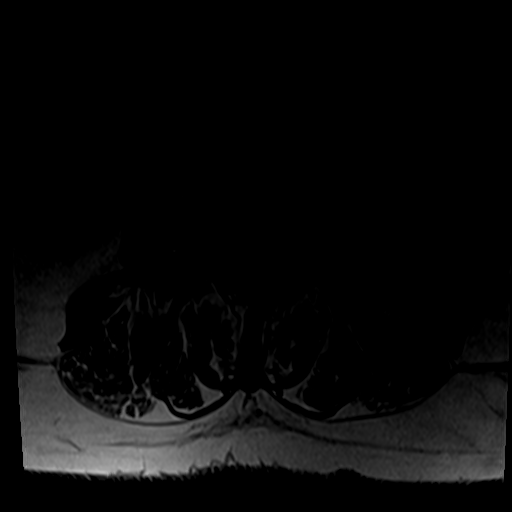
[im 37/44]
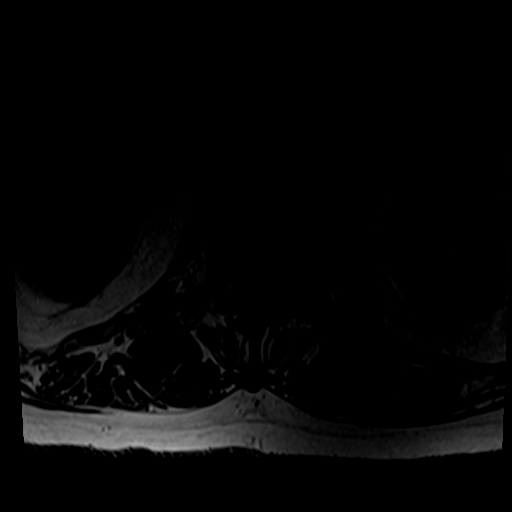

[26 of 48 positions shown; findings below may reference images not displayed]

FINDINGS: Segmentation:  Standard.

Alignment: Trace facet mediated anterolisthesis of L4 on L5.

Vertebrae: No fracture, evidence of discitis, or significant marrow
edema. Mild diffuse bone marrow heterogeneity without suspicious
focal lesion.

Conus medullaris and cauda equina: Conus extends to the L2 level.
Conus and cauda equina appear normal.

Paraspinal and other soft tissues: Subcentimeter T2 hyperintensities
in both kidneys, likely cysts but incompletely evaluated.

Disc levels:

Disc desiccation from T12-L1 to L4-5 with preserved disc space
heights.

T12-L1 and L1-2: Negative.

L2-3: Shallow left subarticular and left foraminal disc protrusion
and mild facet arthrosis without stenosis.

L3-4: Circumferential disc bulging, a left foraminal disc osteophyte
complex, and mild facet hypertrophy result in mild spinal stenosis
and mild left neural foraminal stenosis.

L4-5: Anterolisthesis with bulging uncovered disc, mild ligamentum
flavum hypertrophy, and severe facet hypertrophy result in severe
spinal stenosis and mild-to-moderate right and mild left neural
foraminal stenosis. The L5 nerve roots may be affected in the
lateral recesses.

L5-S1: Mild left facet arthrosis without disc herniation or
stenosis.
IMPRESSION: 1. Severe L4-5 facet arthrosis with slight anterolisthesis and
severe spinal stenosis.
2. Mild spinal stenosis and mild left neural foraminal stenosis at
L3-4.

## 2019-06-17 MED ORDER — PREDNISONE 50 MG PO TABS: ORAL_TABLET | ORAL | 0 refills | Status: DC

## 2019-06-17 MED ORDER — DOCUSATE SODIUM 100 MG PO CAPS: 100.0000 mg | ORAL_CAPSULE | Freq: Three times a day (TID) | ORAL | 3 refills | Status: DC | PRN

## 2019-06-17 NOTE — Patient Instructions (Signed)
Spinal Stenosis  Spinal stenosis occurs when the open space (spinal canal) between the bones of your spine (vertebrae) narrows, putting pressure on the spinal cord or nerves. What are the causes? This condition is caused by areas of bone pushing into the central canals of your vertebrae. This condition may be present at birth (congenital), or it may be caused by:  Arthritic deterioration of your vertebrae (spinal degeneration). This usually starts around age 50.  Injury or trauma to the spine.  Tumors in the spine.  Calcium deposits in the spine. What are the signs or symptoms? Symptoms of this condition include:  Pain in the neck or back that is generally worse with activities, particularly when standing and walking.  Numbness, tingling, hot or cold sensations, weakness, or weariness in your legs.  Pain going up and down the leg (sciatica).  Frequent episodes of falling.  A foot-slapping gait that leads to muscle weakness. In more serious cases, you may develop:  Problemspassing stool or passing urine.  Difficulty having sex.  Loss of feeling in part or all of your leg. Symptoms may come on slowly and get worse over time. How is this diagnosed? This condition is diagnosed based on your medical history and a physical exam. Tests will also be done, such as:  MRI.  CT scan.  X-ray. How is this treated? Treatment for this condition often focuses on managing your pain and any other symptoms. Treatment may include:  Practicing good posture to lessen pressure on your nerves.  Exercising to strengthen muscles, build endurance, improve balance, and maintain good joint movement (range of motion).  Losing weight, if needed.  Taking medicines to reduce swelling, inflammation, or pain.  Assistive devices, such as a corset or brace. In some cases, surgery may be needed. The most common procedure is decompression laminectomy. This is done to remove excess bone that puts  pressure on your nerve roots. Follow these instructions at home: Managing pain, stiffness, and swelling  Do all exercises and stretches as told by your health care provider.  Practice good posture. If you were given a brace or a corset, wear it as told by your health care provider.  Do not do any activities that cause pain. Ask your health care provider what activities are safe for you.  Do not lift anything that is heavier than 10 lb (4.5 kg) or the limit that your health care provider tells you.  Maintain a healthy weight. Talk with your health care provider if you need help losing weight.  If directed, apply heat to the affected area as often as told by your health care provider. Use the heat source that your health care provider recommends, such as a moist heat pack or a heating pad. ? Place a towel between your skin and the heat source. ? Leave the heat on for 20-30 minutes. ? Remove the heat if your skin turns bright red. This is especially important if you are not able to feel pain, heat, or cold. You may have a greater risk of getting burned. General instructions  Take over-the-counter and prescription medicines only as told by your health care provider.  Do not use any products that contain nicotine or tobacco, such as cigarettes and e-cigarettes. If you need help quitting, ask your health care provider.  Eat a healthy diet. This includes plenty of fruits and vegetables, whole grains, and low-fat (lean) protein.  Keep all follow-up visits as told by your health care provider. This is important. Contact   a health care provider if:  Your symptoms do not get better or they get worse.  You have a fever. Get help right away if:  You have new or worse pain in your neck or upper back.  You have severe pain that cannot be controlled with medicines.  You are dizzy.  You have vision problems, blurred vision, or double vision.  You have a severe headache that is worse when you  stand.  You have nausea or you vomit.  You develop new or worse numbness or tingling in your back or legs.  You have pain, redness, swelling, or warmth in your arm or leg. Summary  Spinal stenosis occurs when the open space (spinal canal) between the bones of your spine (vertebrae) narrows. This narrowing puts pressure on the spinal cord or nerves.  Spinal stenosis can cause numbness, weakness, or pain in the neck, back, and legs.  This condition may be caused by a birth defect, arthritic deterioration of your vertebrae, injury, tumors, or calcium deposits.  This condition is usually diagnosed with MRIs, CT scans, and X-rays. This information is not intended to replace advice given to you by your health care provider. Make sure you discuss any questions you have with your health care provider. Document Revised: 04/07/2017 Document Reviewed: 03/30/2016 Elsevier Patient Education  2020 [employer]vier Inc.  

## 2019-06-17 NOTE — Assessment & Plan Note (Addendum)
Benjamin Powell returns, he is a pleasant 74 year old male with chronic back pain with recent flare. X-rays in the past have shown DDD. He is having a recurrence of pain, but this time having some significant difficulty passing stool/dysfunction. Pain is worse with standing up straight, better with leaning forward consistent with spinal stenosis without neurogenic claudication. Because of his stool dysfunction we are going to proceed with x-rays and an MRI today, 5 days of prednisone. The stool dysfunction and constipation is potentially related to cauda equina syndrome and imaging needs to be done urgently as he is unable to drive himself elsewhere. Adding physical therapy. Certainly if evidence of cauda equina syndrome on MRI we will proceed sooner with surgical intervention. Certainly if no evidence of cauda equina syndrome this is likely related to the tramadol, so I am going to go ahead and add some Colace as well. Return to see me in about 6 weeks.

## 2019-06-17 NOTE — Assessment & Plan Note (Signed)
At this point Benjamin Powell has had steroid injections, viscosupplementation, rehabilitation. He uses tramadol from 2-4 times per day and I am happy to refill this as needed as he has decided not to proceed with surgical intervention.

## 2019-06-17 NOTE — Telephone Encounter (Signed)
Left message for patient to call back  

## 2019-06-17 NOTE — Progress Notes (Addendum)
    Procedures performed today:    None.  Independent interpretation of tests performed by another provider:   None.  Impression and Recommendations:    Lumbar degenerative disc disease Benjamin Powell returns, he is a pleasant 74 year old male with chronic back pain with recent flare. X-rays in the past have shown DDD. He is having a recurrence of pain, but this time having some significant difficulty passing stool/dysfunction. Pain is worse with standing up straight, better with leaning forward consistent with spinal stenosis without neurogenic claudication. Because of his stool dysfunction we are going to proceed with x-rays and an MRI today, 5 days of prednisone. The stool dysfunction and constipation is potentially related to cauda equina syndrome and imaging needs to be done urgently as he is unable to drive himself elsewhere. Adding physical therapy. Certainly if evidence of cauda equina syndrome on MRI we will proceed sooner with surgical intervention. Certainly if no evidence of cauda equina syndrome this is likely related to the tramadol, so I am going to go ahead and add some Colace as well. Return to see me in about 6 weeks.  Primary osteoarthritis of both knees At this point Benjamin Powell has had steroid injections, viscosupplementation, rehabilitation. He uses tramadol from 2-4 times per day and I am happy to refill this as needed as he has decided not to proceed with surgical intervention.    ___________________________________________ Gwen Her. Dianah Field, M.D., ABFM., CAQSM. Primary Care and [place] Instructor of [place] [facility]

## 2019-06-19 NOTE — Telephone Encounter (Signed)
No return calls from patient.  

## 2019-06-21 ENCOUNTER — Ambulatory Visit (INDEPENDENT_AMBULATORY_CARE_PROVIDER_SITE_OTHER): Payer: Medicare Other | Admitting: Sports Medicine

## 2019-06-21 ENCOUNTER — Other Ambulatory Visit: Payer: Self-pay

## 2019-06-21 ENCOUNTER — Telehealth: Payer: Self-pay | Admitting: Osteopathic Medicine

## 2019-06-21 DIAGNOSIS — M5136 Other intervertebral disc degeneration, lumbar region: Secondary | ICD-10-CM

## 2019-06-21 DIAGNOSIS — M51369 Other intervertebral disc degeneration, lumbar region without mention of lumbar back pain or lower extremity pain: Secondary | ICD-10-CM

## 2019-06-21 NOTE — Telephone Encounter (Signed)
Patient states that he only has 1 prednisone pill left. Wanted to know what they are suppose to do until the other doctors office calls him for an appointment. States that she doesn't want to be forgotten and the whole weekend passes without an answer. Please call wife and advise.

## 2019-06-21 NOTE — Telephone Encounter (Signed)
He is supposed to wait.  Also the whole weekend is absolutely going to pass without an answer, this is an outpatient referral to Dr. Lynann Bologna.  If he needs something urgently for pain I am happy to send in some narcotic pain medication, but surgery for this is not urgent or emergent.

## 2019-06-21 NOTE — Progress Notes (Signed)
    Procedures performed today:    None.  Independent interpretation of tests performed by another provider:   Lumbar spine MRI personally reviewed, there is mild spinal stenosis at L3-L4 and severe stenosis with an AP diameter of 7 mm at the L4-L5 level.  Impression and Recommendations:    Lumbar degenerative disc disease Benjamin Powell is a pleasant 74 year old male, chronic back pain, recently flaring but now this time with some weakness. He also endorsed significant difficulty passing stool, pain was better with leaning forward all consistent with spinal stenosis. We obtained an MRI, there is severe spinal stenosis at L4-L5. Because of his weakness I do think it is reasonable for him to get a surgical opinion from Dr. Lynann Bologna before we consider any form of epidurals or oral medications. He can continue tramadol with Colace for now.    ___________________________________________ Gwen Her. Dianah Field, M.D., ABFM., CAQSM. Primary Care and [place] Instructor of [place] of [facility] of Medicine

## 2019-06-21 NOTE — Assessment & Plan Note (Signed)
Benjamin Powell is a pleasant 74 year old male, chronic back pain, recently flaring but now this time with some weakness. He also endorsed significant difficulty passing stool, pain was better with leaning forward all consistent with spinal stenosis. We obtained an MRI, there is severe spinal stenosis at L4-L5. Because of his weakness I do think it is reasonable for him to get a surgical opinion from Dr. Lynann Bologna before we consider any form of epidurals or oral medications. He can continue tramadol with Colace for now.

## 2019-06-24 NOTE — Telephone Encounter (Signed)
Left msg on wife's Id'd VM advising of recommendations. Also left number for Dr Laurena Bering office

## 2019-06-26 ENCOUNTER — Telehealth: Payer: Self-pay | Admitting: *Deleted

## 2019-06-26 ENCOUNTER — Telehealth: Payer: Self-pay | Admitting: Osteopathic Medicine

## 2019-06-26 DIAGNOSIS — M51369 Other intervertebral disc degeneration, lumbar region without mention of lumbar back pain or lower extremity pain: Secondary | ICD-10-CM

## 2019-06-26 DIAGNOSIS — M5136 Other intervertebral disc degeneration, lumbar region: Secondary | ICD-10-CM

## 2019-06-26 NOTE — Telephone Encounter (Signed)
Patients wife stopped in and was wanting to get patient a stronger pain med sent to pharmacy below, as his current pain med is not helping and she was wanting something before the ice comes. Please Advise. AM   [place], [place] - [phone] Phone:  [phone]  Fax:  [phone]

## 2019-06-26 NOTE — Telephone Encounter (Signed)
Pt's wife stopped into the office this afternoon stating that the pt needs a different pain medication and they would like to pick it up today before the ice comes in.  Please advise.

## 2019-06-27 MED ORDER — OXYCODONE-ACETAMINOPHEN 10-325 MG PO TABS: 1.0000 | ORAL_TABLET | Freq: Three times a day (TID) | ORAL | 0 refills | Status: DC | PRN

## 2019-06-27 NOTE — Telephone Encounter (Signed)
LMOM notifying them of the new rx.

## 2019-06-27 NOTE — Telephone Encounter (Signed)
No problem, switching to high-dose hydrocodone.

## 2019-07-03 ENCOUNTER — Other Ambulatory Visit: Payer: Self-pay | Admitting: Orthopedic Surgery

## 2019-07-03 DIAGNOSIS — M546 Pain in thoracic spine: Secondary | ICD-10-CM

## 2019-07-03 DIAGNOSIS — M5412 Radiculopathy, cervical region: Secondary | ICD-10-CM

## 2019-07-08 ENCOUNTER — Other Ambulatory Visit: Payer: Self-pay

## 2019-07-08 ENCOUNTER — Emergency Department
Admission: EM | Admit: 2019-07-08 | Discharge: 2019-07-08 | Disposition: A | Discharge status: Home / Self Care | Payer: Medicare Other | Source: Home / Self Care

## 2019-07-08 DIAGNOSIS — L02416 Cutaneous abscess of left lower limb: Secondary | ICD-10-CM

## 2019-07-08 MED ORDER — DOXYCYCLINE HYCLATE 100 MG PO CAPS: 100.0000 mg | ORAL_CAPSULE | Freq: Two times a day (BID) | ORAL | 0 refills | Status: DC

## 2019-07-08 MED ORDER — MUPIROCIN 2 % EX OINT: TOPICAL_OINTMENT | CUTANEOUS | 0 refills | Status: DC

## 2019-07-08 NOTE — Discharge Instructions (Signed)
°  Please take antibiotics as prescribed and be sure to complete entire course even if you start to feel better to ensure infection does not come back. ° °

## 2019-07-08 NOTE — ED Provider Notes (Signed)
[name]    CSN: [id number] Arrival date & time: 07/08/19  1823      History   Chief Complaint Chief Complaint  Patient presents with  . Wound Check    Sore on leg    HPI Benjamin Powell is a 74 y.o. male.   HPI Benjamin Powell is a 74 y.o. male presenting to UC with c/o 1 week of gradually worsening sore on the front of his Left lower leg. He states it started out as a dried appearing blister that he scratched at. Now the area is a larger open wound with a small amount of yellow pus draining onto a bandage at times. He has tried OTC Neosporin with no relief. Denies fever, chills, n/v/d. No known insect bites or injuries. No prior hx of MRSA. He is a diabetic but states his sugar is under control.    Past Medical History:  Diagnosis Date  . Allergy    seasonal  . Anxiety   . Cataract   . Diabetes mellitus   . GERD (gastroesophageal reflux disease)   . Hypertension   . Sleep apnea    On CPAP    Patient Active Problem List   Diagnosis Date Noted  . Acute diarrhea 02/05/2019  . Chronic diarrhea 02/05/2019  . Chronic fatigue 10/06/2017  . Tobacco abuse 10/06/2017  . Hand sprain, left, initial encounter 09/22/2017  . Hand injuries, left, initial encounter 09/11/2017  . Closed head injury 09/11/2017  . Fall in elderly patient 09/11/2017  . History of stroke 02/03/2017  . Stroke (cerebrum) ([place]) 10/30/2016  . GERD (gastroesophageal reflux disease) 04/15/2016  . Depression 06/19/2015  . Hypogonadism in male 04/21/2014  . Degenerative disc disease, cervical 01/31/2014  . Primary osteoarthritis of both knees 01/31/2014  . Obstructive sleep apnea 08/05/2013  . Posterior vitreous detachment, left eye 01/18/2013  . Microalbuminuric diabetic nephropathy ([place]) 01/03/2013  . Spondylosis of cervical region without myelopathy or radiculopathy 12/19/2011  . Lumbar degenerative disc disease 12/19/2011  . Hypertension 12/01/2011  . Type 2 diabetes mellitus  ([place]) 12/01/2011    Past Surgical History:  Procedure Laterality Date  . [place]   left  . CHOLECYSTECTOMY  1990  . ERCP  1990  . Hammer Toe Repair Right 09/16/2016   RT #5  . Partial Excision Phalanx Left 09/16/2016   LT#5  . TONSILLECTOMY AND ADENOIDECTOMY  1998   and sinus for sleep apnea       Home Medications    Prior to Admission medications   Medication Sig Start Date End Date Taking? Authorizing Provider  aspirin 81 MG chewable tablet Chew 81 mg by mouth daily.    [provider]  atorvastatin (LIPITOR) 40 MG tablet Take 1 tablet (40 mg total) by mouth daily. 10/06/17   Emeterio Reeve, DO  Cholecalciferol (VITAMIN D-1000 MAX ST) 1000 units tablet Take 1 tablet (1,000 Units total) by mouth daily. 10/06/17   Emeterio Reeve, DO  cholestyramine Lucrezia Starch) 4 g packet Take 1 packet (4 g total) by mouth 2 (two) times daily. 04/22/19   Ladene Artist, MD  clopidogrel (PLAVIX) 75 MG tablet Take 75 mg by mouth daily.    [provider]  docusate sodium (COLACE) 100 MG capsule Take 1 capsule (100 mg total) by mouth 3 (three) times daily as needed. 06/17/19   Silverio Decamp, MD  doxycycline (VIBRAMYCIN) 100 MG capsule Take 1 capsule (100 mg total) by mouth 2 (two) times daily.  One po bid x 7 days 07/08/19   Noe Gens, PA-C  dutasteride (AVODART) 0.5 MG capsule TAKE ONE CAPSULE BY MOUTH DAILY (NEED APPOINTMENT) 04/01/19   Emeterio Reeve, DO  insulin aspart (NOVOLOG FLEXPEN) 100 UNIT/ML FlexPen Inject 18 Units into the skin 3 (three) times daily with meals.    [provider]  insulin glargine (LANTUS) 100 unit/mL SOPN Inject 15 Units into the skin at bedtime.    [provider]  metFORMIN (GLUCOPHAGE) 500 MG tablet Do no take 6/24 evening, may resume 500mg  twice a day Mon 6/25 02/20/17   [provider]  Metoprolol Tartrate 75 MG TABS Take 75 mg by mouth. 01/13/17   [provider]  mupirocin ointment  (BACTROBAN) 2 % Apply to wound 3 times daily for 5 days 07/08/19   Noe Gens, PA-C  OVER THE COUNTER MEDICATION Zinc 50 mg daily.    [provider]  oxyCODONE-acetaminophen (PERCOCET) 10-325 MG tablet Take 1 tablet by mouth every 8 (eight) hours as needed for pain. 06/27/19   Silverio Decamp, MD  predniSONE (DELTASONE) 50 MG tablet One tab PO daily for 5 days. 06/17/19   Silverio Decamp, MD  spironolactone (ALDACTONE) 25 MG tablet Take 25 mg by mouth daily.    [provider]  tamsulosin (FLOMAX) 0.4 MG CAPS capsule TAKE ONE CAPSULE BY MOUTH DAILY 10/22/18   Breeback, Jade L, PA-C  traMADol (ULTRAM) 50 MG tablet Take 1 tablet (50 mg total) by mouth 2 (two) times daily. 06/14/19   Silverio Decamp, MD  triamcinolone (KENALOG) 0.1 % paste Apply to cracked lips up to twice a day as needed. 03/20/15   Marcial Pacas, DO    Family History Family History  Problem Relation Age of Onset  . Stroke Brother   . Colitis Brother   . Pancreatic cancer Mother   . Colitis Sister   . Colon cancer Neg Hx   . Esophageal cancer Neg Hx   . Rectal cancer Neg Hx   . Stomach cancer Neg Hx     Social History Social History   Tobacco Use  . Smoking status: Current Every Day Smoker    Packs/day: 0.30    Years: 50.00    Pack years: 15.00    Types: Cigarettes  . Smokeless tobacco: Never Used  Substance Use Topics  . Alcohol use: No  . Drug use: No     Allergies   Patient has no known allergies.   Review of Systems Review of Systems  Constitutional: Negative for chills and fever.  Musculoskeletal: Negative for arthralgias, joint swelling and myalgias.  Skin: Positive for color change and wound.     Physical Exam Triage Vital Signs ED Triage Vitals  Enc Vitals Group     BP 07/08/19 1839 114/71     Pulse Rate 07/08/19 1839 65     Resp 07/08/19 1839 18     Temp 07/08/19 1839 97.6 F (36.4 C)     Temp Source 07/08/19 1839 Oral     SpO2 07/08/19 1839 99 %      Weight 07/08/19 1840 210 lb (95.3 kg)     Height 07/08/19 1840 6' (1.829 m)     Head Circumference --      Peak Flow --      Pain Score 07/08/19 1840 5     Pain Loc --      Pain Edu? --      Excl. in [place]? --  No data found.  Updated Vital Signs BP 114/71 (BP Location: Right Arm)   Pulse 65   Temp 97.6 F (36.4 C) (Oral)   Resp 18   Ht 6' (1.829 m)   Wt 210 lb (95.3 kg)   SpO2 99%   BMI 28.48 kg/m   Visual Acuity Right Eye Distance:   Left Eye Distance:   Bilateral Distance:    Right Eye Near:   Left Eye Near:    Bilateral Near:     Physical Exam Vitals and nursing note reviewed.  Constitutional:      Appearance: Normal appearance. He is well-developed.  HENT:     Head: Normocephalic and atraumatic.  Cardiovascular:     Rate and Rhythm: Normal rate.  Pulmonary:     Effort: Pulmonary effort is normal.  Musculoskeletal:        General: Normal range of motion.     Cervical back: Normal range of motion.  Skin:    General: Skin is warm and dry.       Neurological:     Mental Status: He is alert and oriented to person, place, and time.  Psychiatric:        Behavior: Behavior normal.      UC Treatments / Results  Labs (all labs ordered are listed, but only abnormal results are displayed) Labs Reviewed - No data to display  EKG   Radiology No results found.  Procedures Procedures (including critical care time)  Medications Ordered in UC Medications - No data to display  Initial Impression / Assessment and Plan / UC Course  I have reviewed the triage vital signs and the nursing notes.  Pertinent labs & imaging results that were available during my care of the patient were reviewed by me and considered in my medical decision making (see chart for details).     Hx and exam c/w skin infection Will tx with oral and topical antibiotics AVS provided  Final Clinical Impressions(s) / UC Diagnoses   Final diagnoses:  Abscess of left lower leg      Discharge Instructions      Please take antibiotics as prescribed and be sure to complete entire course even if you start to feel better to ensure infection does not come back.     ED Prescriptions    Medication Sig Dispense Auth. Provider   doxycycline (VIBRAMYCIN) 100 MG capsule Take 1 capsule (100 mg total) by mouth 2 (two) times daily. One po bid x 7 days 14 capsule Gerarda Fraction, Glynis Hunsucker O, PA-C   mupirocin ointment (BACTROBAN) 2 % Apply to wound 3 times daily for 5 days 22 g Noe Gens, [place]     PDMP not reviewed this encounter.   Noe Gens, [place] 07/12/19 [phone]

## 2019-07-08 NOTE — ED Triage Notes (Signed)
Pt c/o sore on leg x 1 week. Noticed as a white, itchy patch. Scratched it and now is a red, swollen sore. Pain 5/10

## 2019-07-13 ENCOUNTER — Other Ambulatory Visit: Payer: Medicare Other

## 2019-07-21 ENCOUNTER — Other Ambulatory Visit: Payer: Medicare Other

## 2019-07-28 ENCOUNTER — Other Ambulatory Visit: Payer: Self-pay

## 2019-07-28 ENCOUNTER — Ambulatory Visit (INDEPENDENT_AMBULATORY_CARE_PROVIDER_SITE_OTHER): Payer: Medicare Other

## 2019-07-28 ENCOUNTER — Other Ambulatory Visit: Payer: Self-pay | Admitting: Osteopathic Medicine

## 2019-07-28 DIAGNOSIS — M546 Pain in thoracic spine: Secondary | ICD-10-CM | POA: Diagnosis not present

## 2019-07-28 DIAGNOSIS — M5412 Radiculopathy, cervical region: Secondary | ICD-10-CM

## 2019-07-28 IMAGING — MR MR THORACIC SPINE W/O CM
7 of 8 series · 42 of 48 positions shown · non-contrast
Comparison: None.

CLINICAL DATA: Neck and arm pain, right finger numbness

EXAM:
MRI CERVICAL AND THORACIC SPINE WITHOUT CONTRAST
TECHNIQUE: Multiplanar and multiecho pulse sequences of the cervical spine, to
include the craniocervical junction and cervicothoracic junction,
and the thoracic spine, were obtained without intravenous contrast.

[Series 7: T1 · sagittal · 7.0mm · 1.12mm/px · 3 of 7 slices shown (1 of 2)]
[im 1/7]
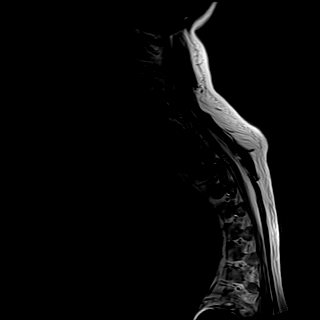
[im 4/7]
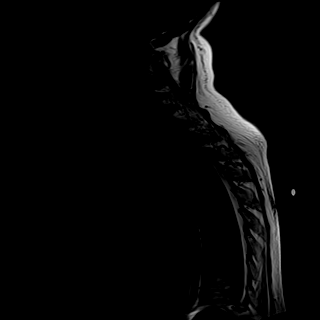
[im 7/7]
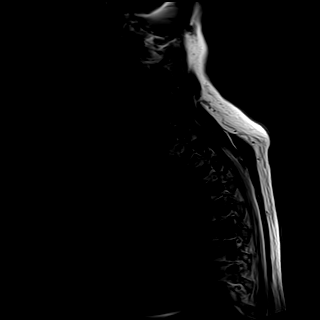

[Series 8: T2 · sagittal · 3.0mm · 1.00mm/px · 7 of 19 slices shown (1 of 3)]
[im 1/19]
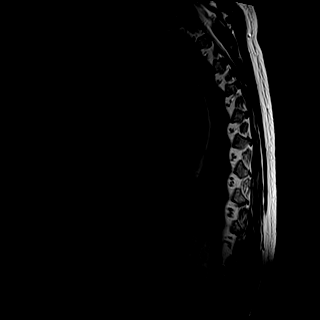
[im 4/19]
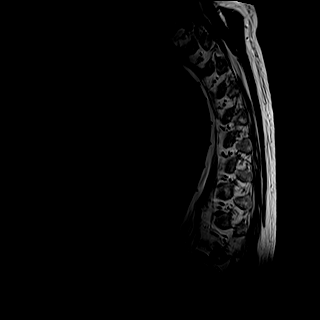
[im 7/19]
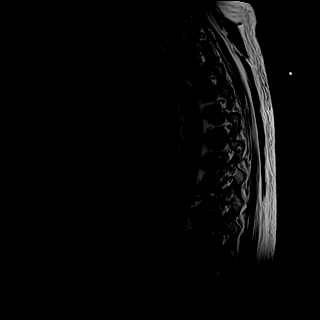
[im 10/19]
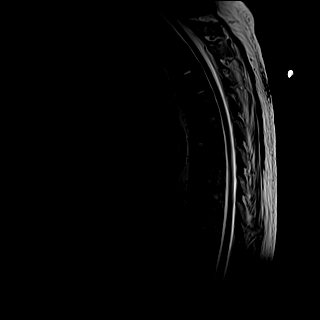
[im 13/19]
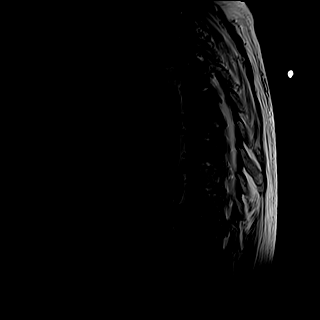
[im 16/19]
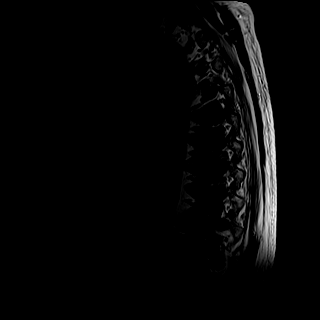
[im 19/19]
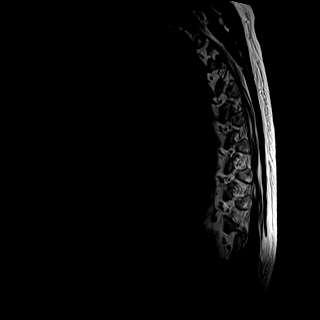

[Series 9: STIR · sagittal · 3.0mm · 1.00mm/px · 6 of 19 slices shown]
[im 1/19]
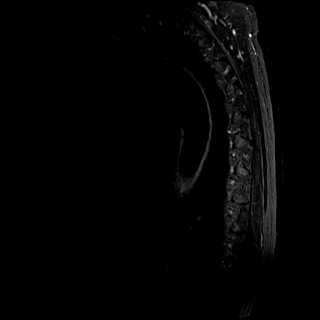
[im 4/19]
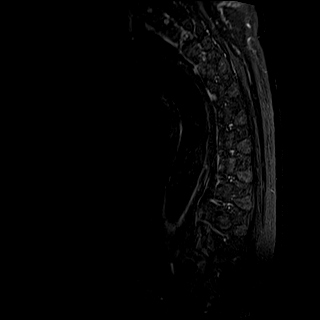
[im 8/19]
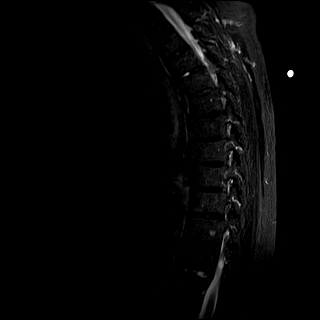
[im 11/19]
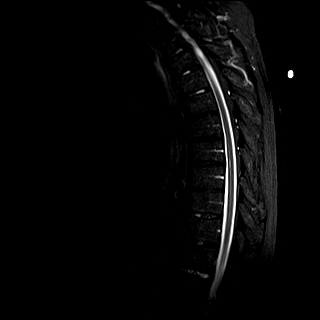
[im 15/19]
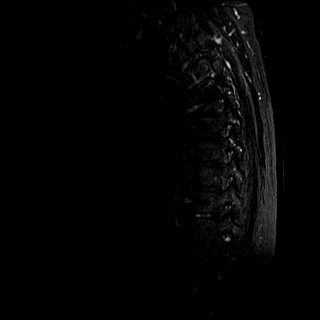
[im 19/19]
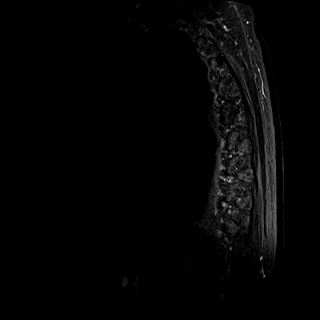

[Series 103: T1 · sagittal · 3.0mm · 1.00mm/px · 6 of 19 slices shown (2 of 2)]
[im 1/19]
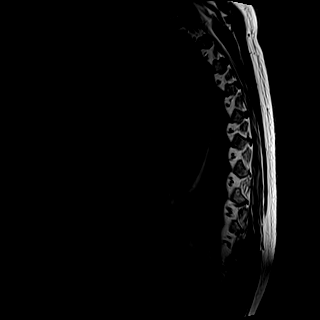
[im 4/19]
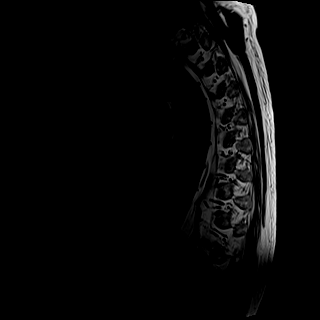
[im 8/19]
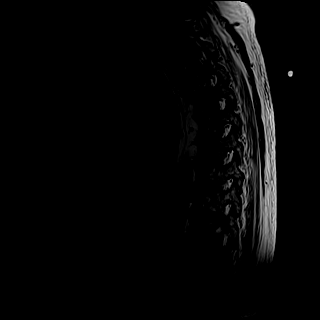
[im 11/19]
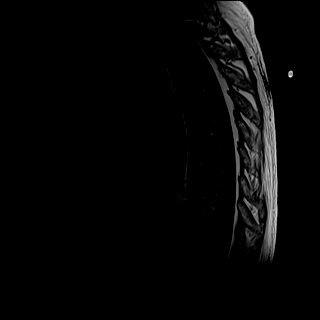
[im 15/19]
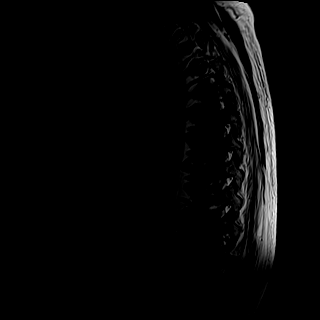
[im 19/19]
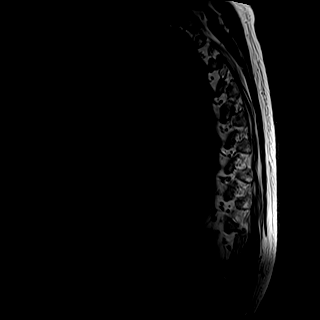

[Series 107: T2 · axial · 5.0mm · 0.86mm/px · z∈[-215,-136]mm · 7 of 22 slices shown (2 of 3)]
[im 1/22]
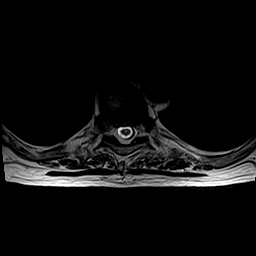
[im 4/22]
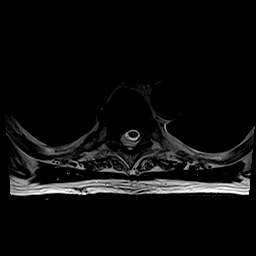
[im 8/22]
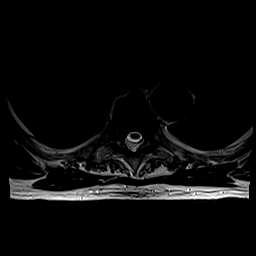
[im 11/22]
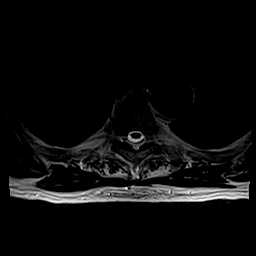
[im 15/22]
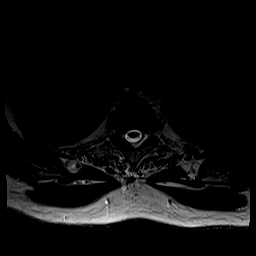
[im 18/22]
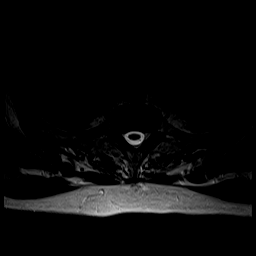
[im 22/22]
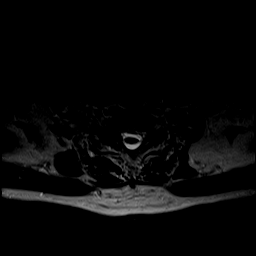

[Series 111: ax mpgr upper · axial · 5.0mm · 0.35mm/px · z∈[-217,-108]mm · 7 of 22 slices shown]
[im 1/22]
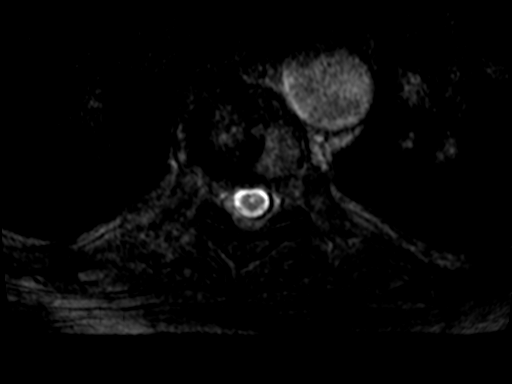
[im 4/22]
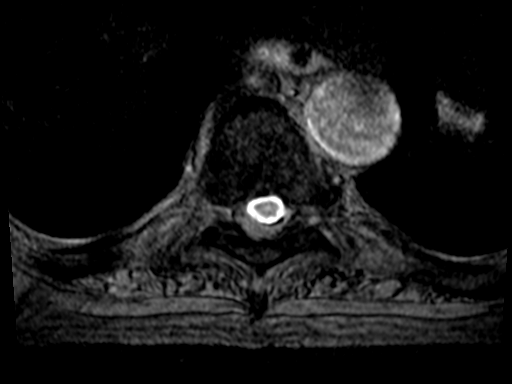
[im 8/22]
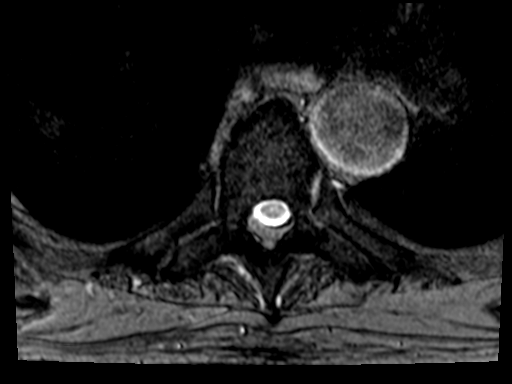
[im 11/22]
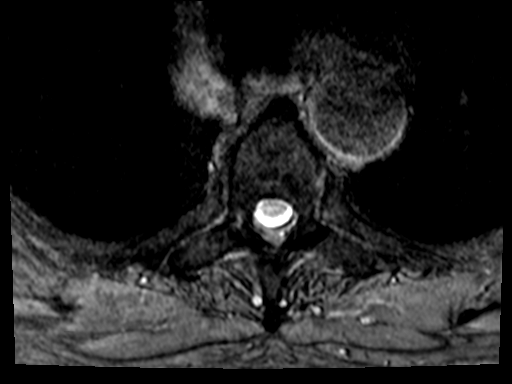
[im 15/22]
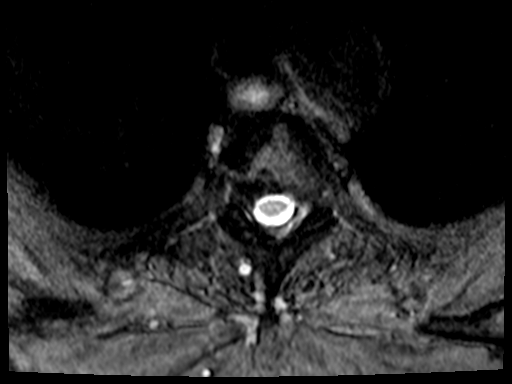
[im 18/22]
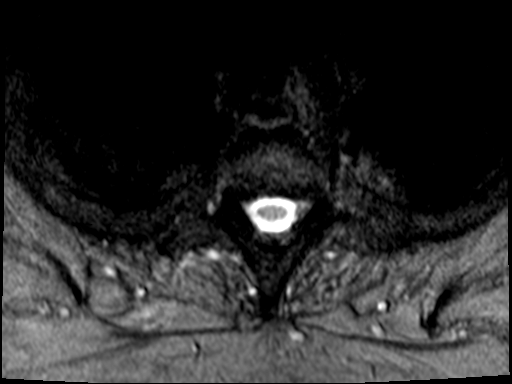
[im 22/22]
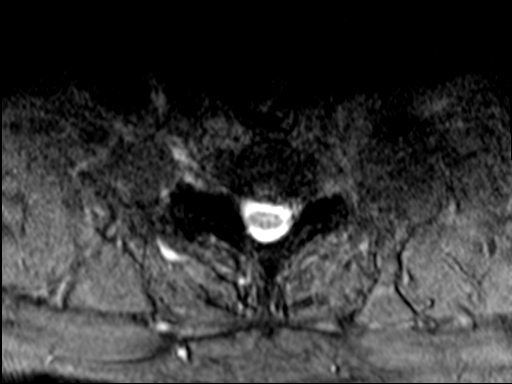

[Series 115: T2 · axial · 5.0mm · 0.86mm/px · z∈[-334,-211]mm · 6 of 18 slices shown (3 of 3)]
[im 1/18]
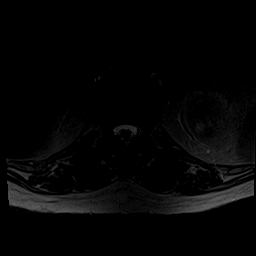
[im 4/18]
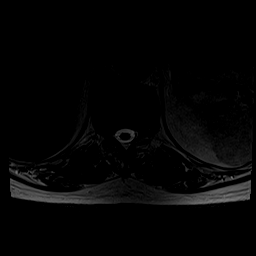
[im 7/18]
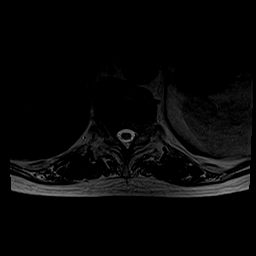
[im 11/18]
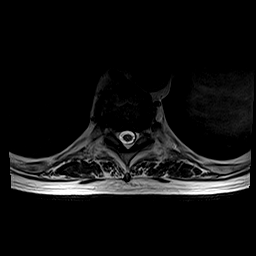
[im 14/18]
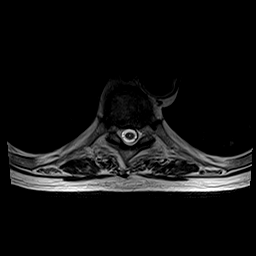
[im 18/18]
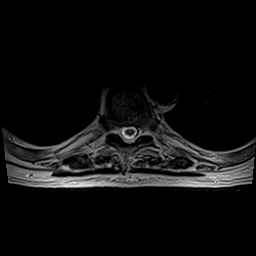

[42 of 48 positions shown; findings below may reference images not displayed]

FINDINGS: MRI CERVICAL SPINE

Motion artifact is present

Alignment:  No significant listhesis.

Vertebrae: There are large anterior bridging osteophytes at C4-C5,
C5-C6, and C6-C7. Vertebral body heights are maintained. There is no
substantial marrow edema or suspicious osseous lesion.

Cord:  No abnormal signal.

Paraspinal and other soft tissues: Unremarkable.

Disc levels:

C2-C3: Disc bulge eccentric to the right with endplate osteophytes,
ligamentum flavum thickening, and facet and uncovertebral
hypertrophy. Mild canal stenosis. Marked right foraminal stenosis.
No left foraminal stenosis.

C3-C4: Disc bulge, endplate osteophytes, ligamentum flavum
thickening, and facet and uncovertebral hypertrophy. Marked canal
stenosis. Moderate right and marked left foraminal stenosis

C4-C5: Facet hypertrophy. No canal or left foraminal stenosis. Minor
right foraminal stenosis.

C5-C6: Disc bulge, endplate osteophytes, and facet and uncovertebral
hypertrophy. No canal or foraminal stenosis.

C6-C7: Bridging endplate osteophytes and uncovertebral hypertrophy.
No canal or right foraminal stenosis. Minor left foraminal stenosis.

C7-T1: Endplate osteophytes and facet hypertrophy with ligamentum
flavum thickening. No canal stenosis. Mild to moderate right and
mild left foraminal stenosis.

MRI THORACIC SPINE

Alignment:  Physiologic.

Vertebrae: Vertebral body heights are maintained. Marrow signal is
mildly heterogeneous. There is no substantial marrow edema or
suspicious osseous lesion.

Cord: Mild prominence of the central canal the cord at mid and lower
thoracic levels. No abnormal signal.

Paraspinal and other soft tissues: Unremarkable.

Disc levels: Mild degenerative disc disease and facet hypertrophy.
No high-grade spinal canal or neural stenosis.
IMPRESSION: Multilevel degenerative changes of the cervical spine detailed
above. Canal stenosis is greatest at C3-C4. Foraminal stenosis is
greatest at C2-C3 and C3-C4.

No high-grade stenosis in the thoracic spine.

## 2019-07-28 IMAGING — MR MR CERVICAL SPINE W/O CM
5 series · 39 of 48 positions shown · non-contrast
Comparison: None.

CLINICAL DATA: Neck and arm pain, right finger numbness

EXAM:
MRI CERVICAL AND THORACIC SPINE WITHOUT CONTRAST
TECHNIQUE: Multiplanar and multiecho pulse sequences of the cervical spine, to
include the craniocervical junction and cervicothoracic junction,
and the thoracic spine, were obtained without intravenous contrast.

[Series 5: T2 · sagittal · 3.0mm · 0.69mm/px · 6 of 15 slices shown (1 of 2)]
[im 1/15]
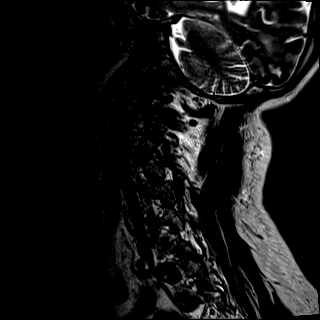
[im 3/15]
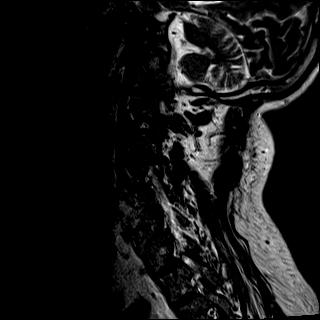
[im 6/15]
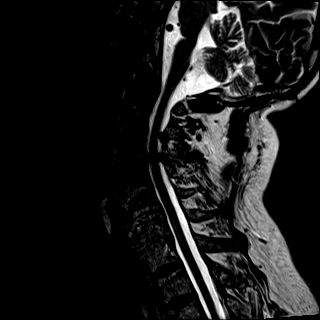
[im 9/15]
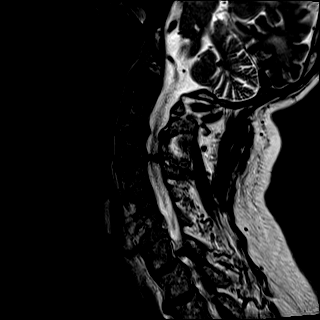
[im 12/15]
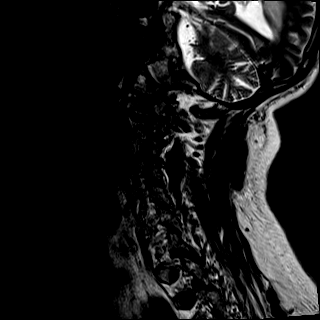
[im 15/15]
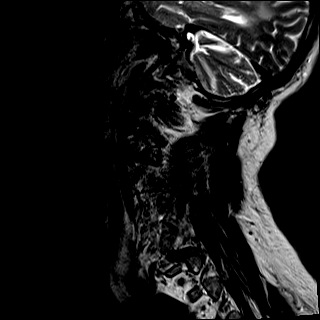

[Series 6: T1 · sagittal · 3.0mm · 0.86mm/px · 7 of 15 slices shown]
[im 1/15]
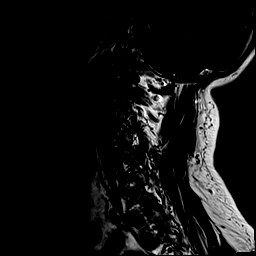
[im 3/15]
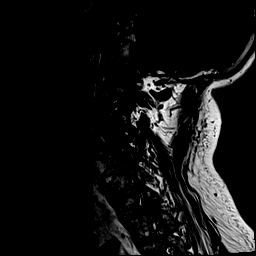
[im 5/15]
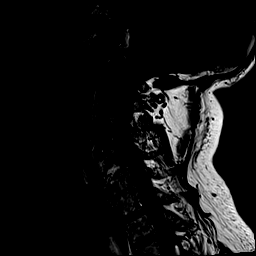
[im 8/15]
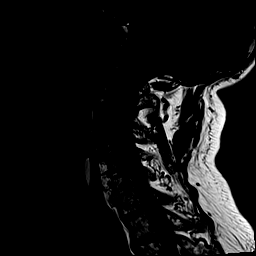
[im 10/15]
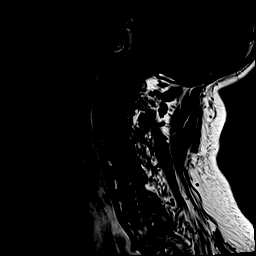
[im 12/15]
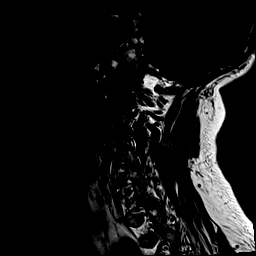
[im 15/15]
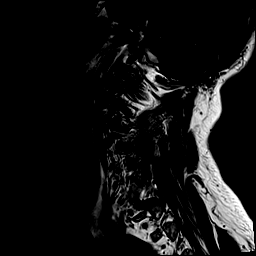

[Series 7: STIR · sagittal · 3.0mm · 0.69mm/px · 7 of 15 slices shown]
[im 1/15]
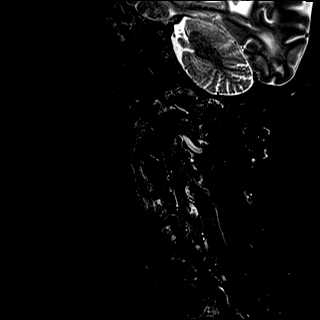
[im 3/15]
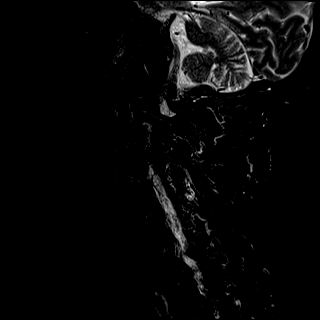
[im 5/15]
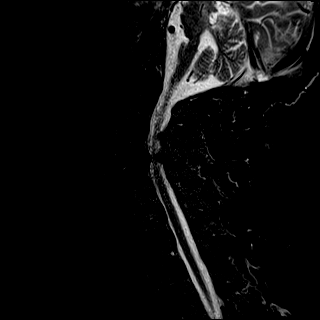
[im 8/15]
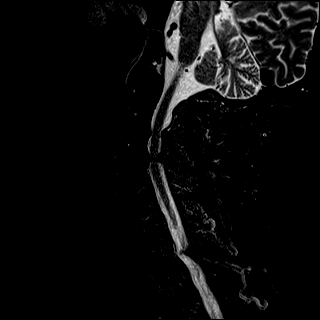
[im 10/15]
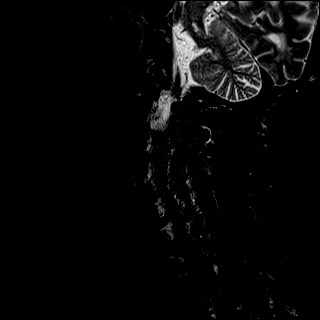
[im 12/15]
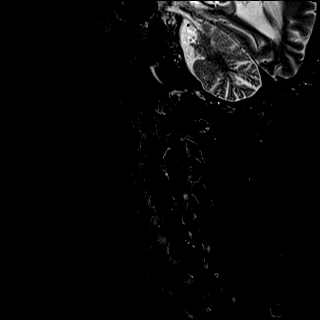
[im 15/15]
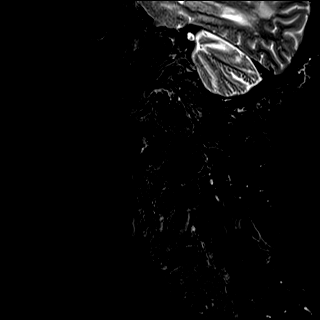

[Series 8: T2 · axial · 3.0mm · 0.62mm/px · z∈[-135,-37]mm · 11 of 31 slices shown (2 of 2)]
[im 1/31]
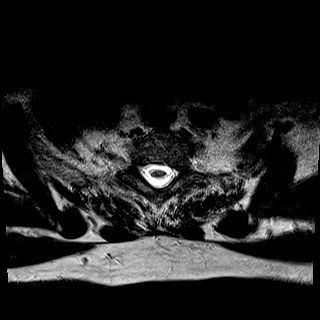
[im 3/31]
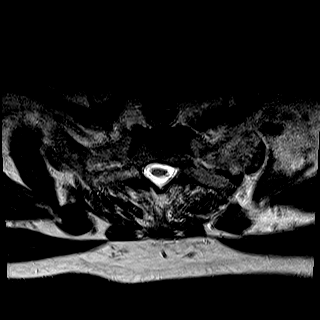
[im 5/31]
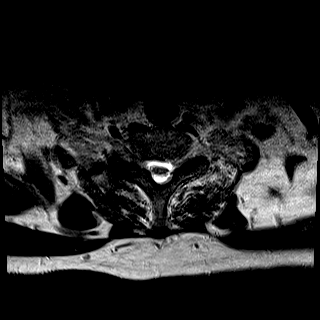
[im 7/31]
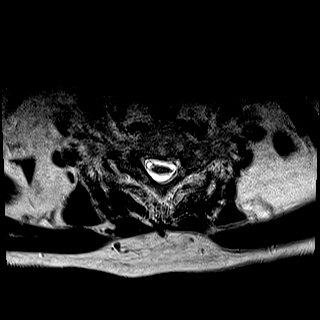
[im 10/31]
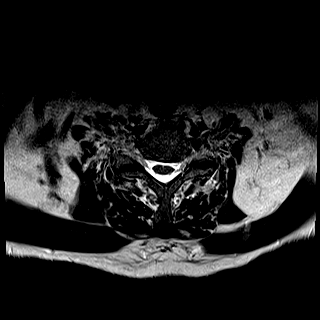
[im 12/31]
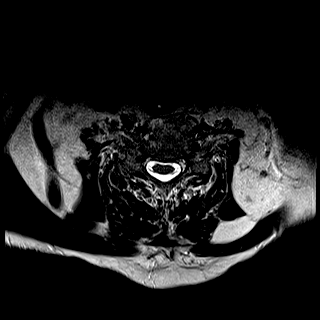
[im 14/31]
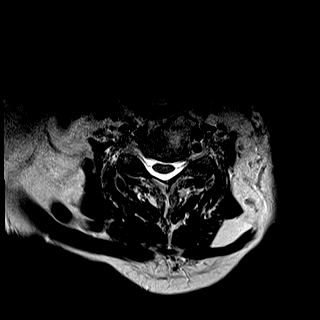
[im 17/31]
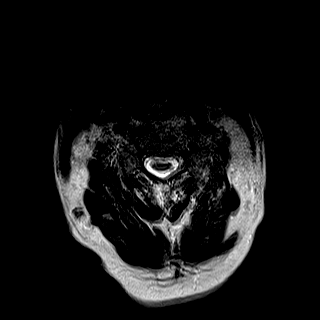
[im 21/31]
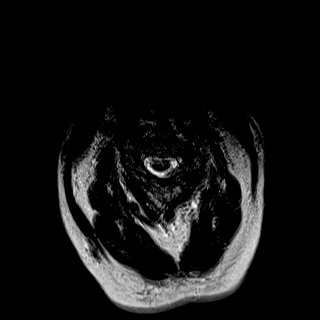
[im 26/31]
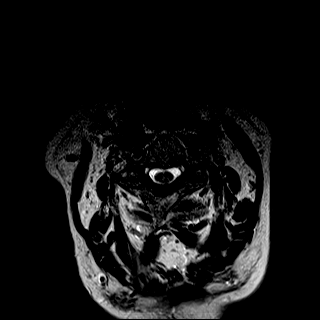
[im 31/31]
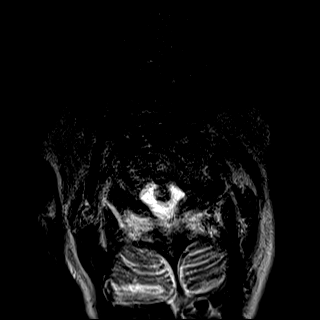

[Series 9: mpgr ax · axial · 3.0mm · 0.35mm/px · z∈[-117,-19]mm · 8 of 31 slices shown]
[im 1/31]
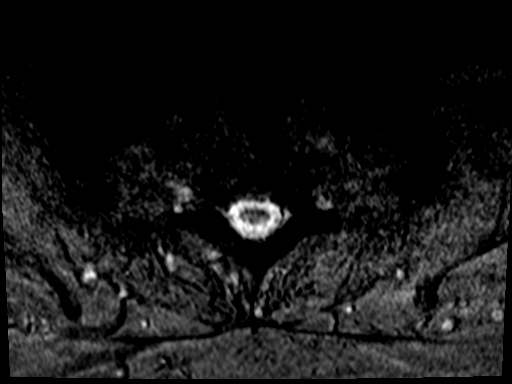
[im 5/31]
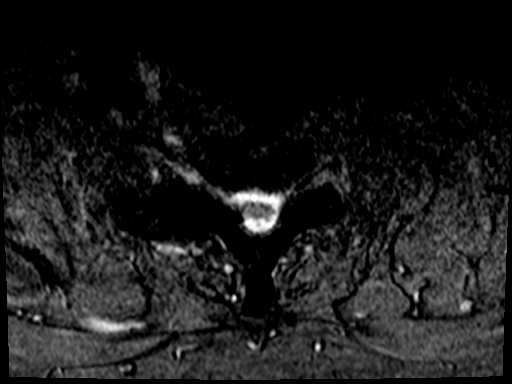
[im 10/31]
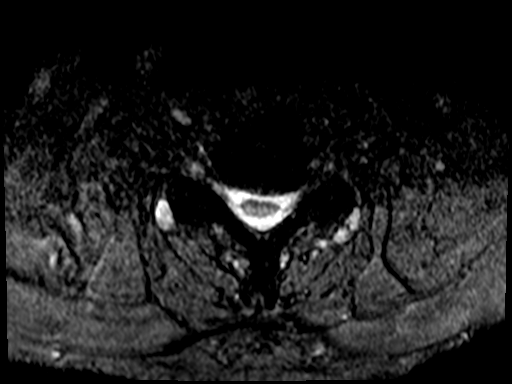
[im 14/31]
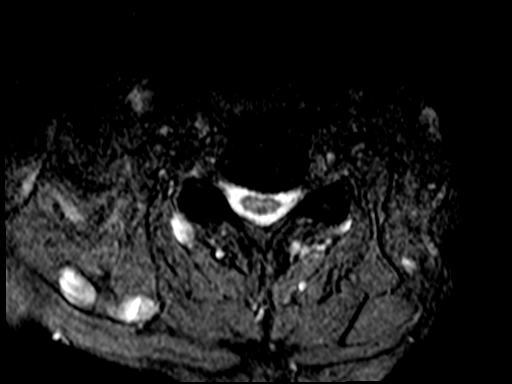
[im 17/31]
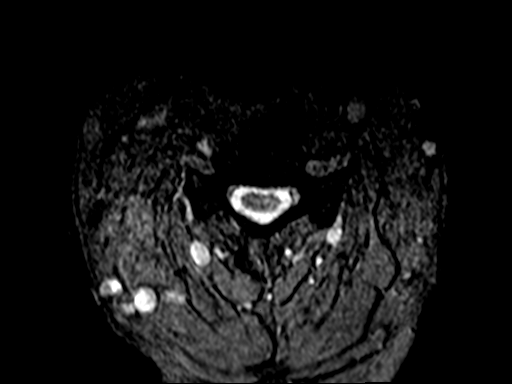
[im 21/31]
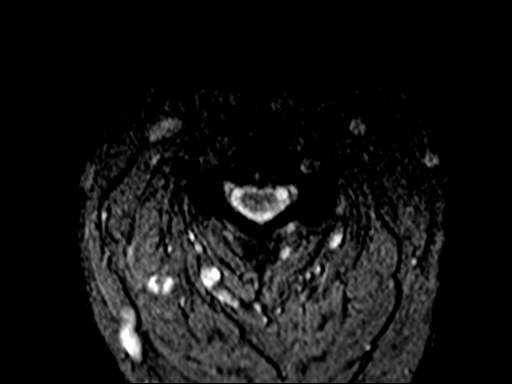
[im 26/31]
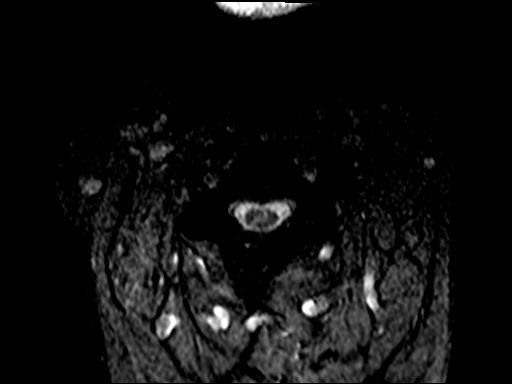
[im 31/31]
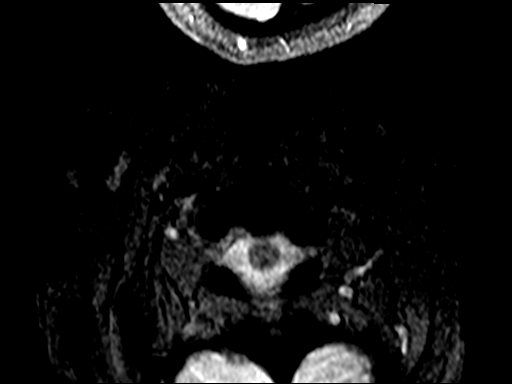

[39 of 48 positions shown; findings below may reference images not displayed]

FINDINGS: MRI CERVICAL SPINE

Motion artifact is present

Alignment:  No significant listhesis.

Vertebrae: There are large anterior bridging osteophytes at C4-C5,
C5-C6, and C6-C7. Vertebral body heights are maintained. There is no
substantial marrow edema or suspicious osseous lesion.

Cord:  No abnormal signal.

Paraspinal and other soft tissues: Unremarkable.

Disc levels:

C2-C3: Disc bulge eccentric to the right with endplate osteophytes,
ligamentum flavum thickening, and facet and uncovertebral
hypertrophy. Mild canal stenosis. Marked right foraminal stenosis.
No left foraminal stenosis.

C3-C4: Disc bulge, endplate osteophytes, ligamentum flavum
thickening, and facet and uncovertebral hypertrophy. Marked canal
stenosis. Moderate right and marked left foraminal stenosis

C4-C5: Facet hypertrophy. No canal or left foraminal stenosis. Minor
right foraminal stenosis.

C5-C6: Disc bulge, endplate osteophytes, and facet and uncovertebral
hypertrophy. No canal or foraminal stenosis.

C6-C7: Bridging endplate osteophytes and uncovertebral hypertrophy.
No canal or right foraminal stenosis. Minor left foraminal stenosis.

C7-T1: Endplate osteophytes and facet hypertrophy with ligamentum
flavum thickening. No canal stenosis. Mild to moderate right and
mild left foraminal stenosis.

MRI THORACIC SPINE

Alignment:  Physiologic.

Vertebrae: Vertebral body heights are maintained. Marrow signal is
mildly heterogeneous. There is no substantial marrow edema or
suspicious osseous lesion.

Cord: Mild prominence of the central canal the cord at mid and lower
thoracic levels. No abnormal signal.

Paraspinal and other soft tissues: Unremarkable.

Disc levels: Mild degenerative disc disease and facet hypertrophy.
No high-grade spinal canal or neural stenosis.
IMPRESSION: Multilevel degenerative changes of the cervical spine detailed
above. Canal stenosis is greatest at C3-C4. Foraminal stenosis is
greatest at C2-C3 and C3-C4.

No high-grade stenosis in the thoracic spine.

## 2019-07-29 ENCOUNTER — Ambulatory Visit: Payer: Self-pay | Admitting: Sports Medicine

## 2019-07-29 ENCOUNTER — Telehealth: Payer: Self-pay | Admitting: Osteopathic Medicine

## 2019-07-29 ENCOUNTER — Other Ambulatory Visit: Payer: Self-pay | Admitting: Physician Assistant

## 2019-07-29 DIAGNOSIS — K219 Gastro-esophageal reflux disease without esophagitis: Secondary | ICD-10-CM

## 2019-07-29 NOTE — Telephone Encounter (Signed)
Can you call this patient for a follow up to get his medications?

## 2019-07-29 NOTE — Telephone Encounter (Signed)
Please call this patient for a follow up to get his medications refilled.

## 2019-07-30 NOTE — Telephone Encounter (Signed)
Left VM for PT to return.

## 2019-08-05 ENCOUNTER — Other Ambulatory Visit: Payer: Self-pay | Admitting: Medical-Surgical

## 2019-08-05 DIAGNOSIS — K219 Gastro-esophageal reflux disease without esophagitis: Secondary | ICD-10-CM

## 2019-08-05 MED ORDER — PANTOPRAZOLE SODIUM 40 MG PO TBEC: 40.0000 mg | DELAYED_RELEASE_TABLET | Freq: Every day | ORAL | 1 refills | Status: DC

## 2019-08-05 NOTE — Progress Notes (Signed)
Notification received from insurance regarding safety concern with omeprazole and Plavix concurrently.  Changing omeprazole 40 mg daily to Protonix 40 mg daily as these do not show any drug interactions.

## 2019-08-06 NOTE — Progress Notes (Signed)
LVMTRC (1st attempt)   

## 2019-08-07 NOTE — Progress Notes (Signed)
Called patient and wife and unable to get in touch. FYI to PCP and assistant

## 2019-08-07 NOTE — Progress Notes (Signed)
LVMTRC (2nd attempt)  

## 2019-08-07 NOTE — Progress Notes (Signed)
Left a vm msg for pt's wife to return a call back to clinic. Direct call back info provided.

## 2019-08-07 NOTE — Progress Notes (Signed)
Pt's wife returned a call back, informed of medication switch. She mentioned it was the cardiologist that prescribed omeprazole rx. She was upset that pt was not informed by pharmacist, insurance or that provider got involved in changing acid reflux medication. She will have pt p/u protonix rx from pharmacy and update his cardiologist. Pt will contact the office as needed. No other inquiries during call.

## 2019-08-12 ENCOUNTER — Other Ambulatory Visit: Payer: Self-pay | Admitting: *Deleted

## 2019-08-12 DIAGNOSIS — M5136 Other intervertebral disc degeneration, lumbar region: Secondary | ICD-10-CM

## 2019-08-12 MED ORDER — OXYCODONE-ACETAMINOPHEN 10-325 MG PO TABS: 1.0000 | ORAL_TABLET | Freq: Three times a day (TID) | ORAL | 0 refills | Status: DC | PRN

## 2019-08-20 DIAGNOSIS — R69 Illness, unspecified: Secondary | ICD-10-CM | POA: Diagnosis not present

## 2019-08-20 DIAGNOSIS — G959 Disease of spinal cord, unspecified: Secondary | ICD-10-CM | POA: Diagnosis not present

## 2019-08-20 DIAGNOSIS — F17210 Nicotine dependence, cigarettes, uncomplicated: Secondary | ICD-10-CM | POA: Diagnosis not present

## 2019-08-29 ENCOUNTER — Other Ambulatory Visit: Payer: Self-pay | Admitting: Orthopedic Surgery

## 2019-09-01 ENCOUNTER — Other Ambulatory Visit: Payer: Self-pay | Admitting: Physician Assistant

## 2019-09-01 DIAGNOSIS — I10 Essential (primary) hypertension: Secondary | ICD-10-CM

## 2019-09-02 NOTE — Telephone Encounter (Signed)
Not on med list, please review

## 2019-09-27 ENCOUNTER — Encounter (HOSPITAL_COMMUNITY)
Admission: RE | Admit: 2019-09-27 | Discharge: 2019-09-27 | Disposition: A | Discharge status: Home / Self Care | Payer: Medicare HMO | Source: Ambulatory Visit | Attending: Orthopedic Surgery | Admitting: Orthopedic Surgery

## 2019-09-27 ENCOUNTER — Encounter (HOSPITAL_COMMUNITY): Payer: Self-pay

## 2019-09-27 ENCOUNTER — Other Ambulatory Visit: Payer: Self-pay

## 2019-09-27 ENCOUNTER — Telehealth: Payer: Self-pay | Admitting: Sports Medicine

## 2019-09-27 DIAGNOSIS — Z01818 Encounter for other preprocedural examination: Secondary | ICD-10-CM | POA: Diagnosis not present

## 2019-09-27 HISTORY — DX: Cerebral infarction, unspecified: I63.9

## 2019-09-27 HISTORY — DX: Benign prostatic hyperplasia without lower urinary tract symptoms: N40.0

## 2019-09-27 LAB — CBC WITH DIFFERENTIAL/PLATELET
Abs Immature Granulocytes: 0.09 10*3/uL — ABNORMAL HIGH (ref 0.00–0.07)
Basophils Absolute: 0.1 10*3/uL (ref 0.0–0.1)
Basophils Relative: 1 %
Eosinophils Absolute: 0.2 10*3/uL (ref 0.0–0.5)
Eosinophils Relative: 1 %
HCT: 50.5 % (ref 39.0–52.0)
Hemoglobin: 17.3 g/dL — ABNORMAL HIGH (ref 13.0–17.0)
Immature Granulocytes: 1 %
Lymphocytes Relative: 21 %
Lymphs Abs: 2.6 10*3/uL (ref 0.7–4.0)
MCH: 31.4 pg (ref 26.0–34.0)
MCHC: 34.3 g/dL (ref 30.0–36.0)
MCV: 91.7 fL (ref 80.0–100.0)
Monocytes Absolute: 0.7 10*3/uL (ref 0.1–1.0)
Monocytes Relative: 6 %
Neutro Abs: 8.3 10*3/uL — ABNORMAL HIGH (ref 1.7–7.7)
Neutrophils Relative %: 70 %
Platelets: 200 10*3/uL (ref 150–400)
RBC: 5.51 MIL/uL (ref 4.22–5.81)
RDW: 12.3 % (ref 11.5–15.5)
WBC: 11.9 10*3/uL — ABNORMAL HIGH (ref 4.0–10.5)
nRBC: 0 % (ref 0.0–0.2)

## 2019-09-27 LAB — URINALYSIS, ROUTINE W REFLEX MICROSCOPIC
Bilirubin Urine: NEGATIVE
Glucose, UA: >=500 mg/dL — AB
Hgb urine dipstick: NEGATIVE
Ketones, ur: NEGATIVE mg/dL
Leukocytes,Ua: NEGATIVE
Nitrite: NEGATIVE
Protein, ur: 30 mg/dL — AB
Specific Gravity, Urine: 1.025 (ref 1.005–1.030)
pH: 5 (ref 5.0–8.0)

## 2019-09-27 LAB — COMPREHENSIVE METABOLIC PANEL
ALT: 54 U/L — ABNORMAL HIGH (ref 0–44)
AST: 23 U/L (ref 15–41)
Albumin: 4 g/dL (ref 3.5–5.0)
Alkaline Phosphatase: 69 U/L (ref 38–126)
Anion gap: 12 (ref 5–15)
BUN: 32 mg/dL — ABNORMAL HIGH (ref 8–23)
CO2: 23 mmol/L (ref 22–32)
Calcium: 9.7 mg/dL (ref 8.9–10.3)
Chloride: 95 mmol/L — ABNORMAL LOW (ref 98–111)
Creatinine, Ser: 1.76 mg/dL — ABNORMAL HIGH (ref 0.61–1.24)
GFR calc Af Amer: 44 mL/min — ABNORMAL LOW (ref >60)
GFR calc non Af Amer: 38 mL/min — ABNORMAL LOW (ref >60)
Glucose, Bld: 332 mg/dL — ABNORMAL HIGH (ref 70–99)
Potassium: 4.3 mmol/L (ref 3.5–5.1)
Sodium: 130 mmol/L — ABNORMAL LOW (ref 135–145)
Total Bilirubin: 0.8 mg/dL (ref 0.3–1.2)
Total Protein: 7.2 g/dL (ref 6.5–8.1)

## 2019-09-27 LAB — TYPE AND SCREEN
ABO/RH(D): O NEG
Antibody Screen: NEGATIVE

## 2019-09-27 LAB — PROTIME-INR
INR: 1 (ref 0.8–1.2)
Prothrombin Time: 13.2 seconds (ref 11.4–15.2)

## 2019-09-27 LAB — HEMOGLOBIN A1C
Hgb A1c MFr Bld: 10.2 % — ABNORMAL HIGH (ref 4.8–5.6)
Mean Plasma Glucose: 246.04 mg/dL

## 2019-09-27 LAB — GLUCOSE, CAPILLARY: Glucose-Capillary: 332 mg/dL — ABNORMAL HIGH (ref 70–99)

## 2019-09-27 LAB — APTT: aPTT: 25 seconds (ref 24–36)

## 2019-09-27 LAB — SURGICAL PCR SCREEN
MRSA, PCR: NEGATIVE
Staphylococcus aureus: NEGATIVE

## 2019-09-27 LAB — ABO/RH: ABO/RH(D): O NEG

## 2019-09-27 NOTE — Progress Notes (Signed)
[place], [place] [address] [place] [address] Phone: [phone] Fax: [facility] [place], [place] [address] Phone: [phone] Fax: [phone]      Your procedure is scheduled on Wednesday 10/02/2019.  Report to [facility] Main Entrance "A" at 07:50 A.M., and check in at the Admitting office.  Call this number if you have problems the morning of surgery:  [phone]  Call [phone] if you have any questions prior to your surgery date Monday-Friday 8am-4pm    Remember:  Do not eat after midnight the night before your surgery  You may drink clear liquids until 07:50 the morning of your surgery.   Clear liquids allowed are: Water, Non-Citrus Juices (without pulp), Carbonated Beverages, Clear Tea, Black Coffee Only, and Gatorade   Enhanced Recovery after Surgery for Orthopedics Enhanced Recovery after Surgery is a protocol used to improve the stress on your body and your recovery after surgery.  Patient Instructions  . The night before surgery:  o No food after midnight. ONLY clear liquids after midnight   . The day of surgery (if you have diabetes): o  o Drink ONE (1) Gatorade 2 (G2) as directed. o This drink was given to you during your hospital  pre-op appointment visit.  o The pre-op nurse will instruct you on the time to drink the   Gatorade 2 (G2) depending on your surgery time. - complete by 07:50am the morning of your surgery o Color of the Gatorade may vary. Red is not allowed. o Nothing else to drink after completing the  Gatorade 2 (G2).         If you have questions, please contact your surgeon's office.     Take these medicines the morning of surgery with A SIP OF WATER: Metoprolol (Lopressor) Pantoprazole (Protonix) Tamsulosin (Flomax)  As of today, STOP taking any Aspirin (unless otherwise instructed  by your surgeon) and Aspirin containing products, Aleve, Naproxen, Ibuprofen, Motrin, Advil, Goody's, BC's, all herbal medications, fish oil, and all vitamins.  Follow your surgeon's instructions on when to stop Aspirin and Plavix.  If no instructions were given by your surgeon then you will need to call the office to get those instructions.     WHAT DO I DO ABOUT MY DIABETES MEDICATION?   . THE NIGHT BEFORE SURGERY, take 12 units of Lantus/insulin glargine  . The day of surgery, do not take other diabetes injectables, including Semaglutide (Ozempic)   HOW TO MANAGE YOUR DIABETES BEFORE AND AFTER SURGERY  Why is it important to control my blood sugar before and after surgery? . Improving blood sugar levels before and after surgery helps healing and can limit problems. . A way of improving blood sugar control is eating a healthy diet by: o  Eating less sugar and carbohydrates o  Increasing activity/exercise o  Talking with your doctor about reaching your blood sugar goals . High blood sugars (greater than 180 mg/dL) can raise your risk of infections and slow your recovery, so you will need to focus on controlling your diabetes during the weeks before surgery. . Make sure that the doctor who takes care of your diabetes knows about your planned surgery including the date and location.  How do I manage my blood sugar before surgery? . Check your blood sugar at least 4 times a day, starting 2 days before surgery, to make sure that  the level is not too high or low. . Check your blood sugar the morning of your surgery when you wake up and every 2 hours until you get to the Short Stay unit. o If your blood sugar is less than 70 mg/dL, you will need to treat for low blood sugar: - Do not take insulin. - Treat a low blood sugar (less than 70 mg/dL) with  cup of clear juice (cranberry or apple), 4 glucose tablets, OR glucose gel. - Recheck blood sugar in 15 minutes after treatment (to make sure  it is greater than 70 mg/dL). If your blood sugar is not greater than 70 mg/dL on recheck, call [phone] for further instructions. . Report your blood sugar to the short stay nurse when you get to Short Stay.  . If you are admitted to the hospital after surgery: o Your blood sugar will be checked by the staff and you will probably be given insulin after surgery (instead of oral diabetes medicines) to make sure you have good blood sugar levels. o The goal for blood sugar control after surgery is 80-180 mg/dL.                       Do not wear jewelry            Do not wear lotions, powders, colognes, or deodorant.            Men may shave face and neck.            Do not bring valuables to the hospital.            [facility] is not responsible for any belongings or valuables.  Do NOT Smoke (Tobacco/Vapping) or drink Alcohol 24 hours prior to your procedure  If you use a CPAP at night, you may bring all equipment for your overnight stay.   Contacts, glasses, dentures or bridgework may not be worn into surgery.      For patients admitted to the hospital, discharge time will be determined by your treatment team.   Patients discharged the day of surgery will not be allowed to drive home, and someone needs to stay with them for 24 hours.    Special instructions:   - Preparing For Surgery  Before surgery, you can play an important role. Because skin is not sterile, your skin needs to be as free of germs as possible. You can reduce the number of germs on your skin by washing with CHG (chlorahexidine gluconate) Soap before surgery.  CHG is an antiseptic cleaner which kills germs and bonds with the skin to continue killing germs even after washing.    Oral Hygiene is also important to reduce your risk of infection.  Remember - BRUSH YOUR TEETH THE MORNING OF SURGERY WITH YOUR REGULAR TOOTHPASTE  Please do not use if you have an allergy to CHG or antibacterial soaps. If your  skin becomes reddened/irritated stop using the CHG.  Do not shave (including legs and underarms) for at least 48 hours prior to first CHG shower. It is OK to shave your face.  Please follow these instructions carefully.   1. Shower the NIGHT BEFORE SURGERY and the MORNING OF SURGERY with CHG Soap.   2. If you chose to wash your hair, wash your hair first as usual with your normal shampoo.  3. After you shampoo, rinse your hair and body thoroughly to remove the shampoo.  4. Use CHG as you would  any other liquid soap. You can apply CHG directly to the skin and wash gently with a scrungie or a clean washcloth.   5. Apply the CHG Soap to your body ONLY FROM THE NECK DOWN.  Do not use on open wounds or open sores. Avoid contact with your eyes, ears, mouth and genitals (private parts). Wash Face and genitals (private parts)  with your normal soap.   6. Wash thoroughly, paying special attention to the area where your surgery will be performed.  7. Thoroughly rinse your body with warm water from the neck down.  8. DO NOT shower/wash with your normal soap after using and rinsing off the CHG Soap.  9. Pat yourself dry with a CLEAN TOWEL.  10. Wear CLEAN PAJAMAS to bed the night before surgery, wear comfortable clothes the morning of surgery  11. Place CLEAN SHEETS on your bed the night of your first shower and DO NOT SLEEP WITH PETS.   Day of Surgery: Shower with CHG soap as directed Do not apply any deodorants/lotions.  Please wear clean clothes to the hospital/surgery center.   Remember to brush your teeth WITH YOUR REGULAR TOOTHPASTE.   Please read over the following fact sheets that you were given.

## 2019-09-27 NOTE — Telephone Encounter (Signed)
Patient states he wants a refill on Tramadol which should be a 90 day supply. Thanks.

## 2019-09-27 NOTE — Progress Notes (Signed)
Butch Penny at Dr. Laurena Bering office notified of abnormal labs.

## 2019-09-27 NOTE — Progress Notes (Signed)
PCP - Dr. Emeterio Reeve Cardiologist - Dr. Hartford Poli Endocrinologist - Dr. Denton Lank  PPM/ICD - n/a Device Orders -  Rep Notified -   Chest x-ray - n/a EKG - 09/27/19 Stress Test - patient denies ECHO - 07/07/2017 Cardiac Cath - patient denies  Sleep Study - 2017 CPAP -   Fasting Blood Sugar - patient is unsure of recent FBS.  He has not had any sensors for the past 1-2 months to be able to check his CBG.  He states it usually runs around 200 fasting. Checks Blood Sugar _____ times a day - unable to at this time.  Patient's CBG upon arrival for PAT appointment was 332.  Patient had not eaten since 1800 the day prior but did drink a small amount of apple juice that morning.  Patient stated he is not able to check his CBG regularly.  Patient was educated about the importance of having his CBG under control before surgery and checking his CBG once he has his sensors.  A1c obtained at PAT appointment.  Blood Thinner and Aspirin Instructions: plavix and asa - patient is not sure of his instructions regarding his blood thinner and ASA.  Butch Penny at Dr. Laurena Bering office contacted.  Butch Penny contacted Dr. Shirlyn Goltz office today and is to be in touch with patient regarding instructions.   ERAS Protcol - clears until 0750 PRE-SURGERY Ensure or G2- G2 given to patient.  COVID TEST- scheduled for COVID test Saturday 09/28/19   Anesthesia review:  Yes, abnormal EKG, hx of Echo, CBG 332 at PAT appointment.  Patient denies shortness of breath, fever, cough and chest pain at PAT appointment   All instructions explained to the patient, with a verbal understanding of the material. Patient agrees to go over the instructions while at home for a better understanding. Patient also instructed to self quarantine after being tested for COVID-19. The opportunity to ask questions was provided.

## 2019-09-27 NOTE — Progress Notes (Signed)
Received call from patient's wife, [name].  She stated they were able to go by the endocrinologist's office to pick up 2 sensors for the Libre glucose monitor, as they are having trouble with their insurance and have not had any for over a month.  She was questioning if they should put one on and did not want to if the patient was going to have to remove it for surgery.  I advised the patient's wife that the patient should put on the sensor and start monitoring his CBGs given his blood sugar was so high this morning at his PAT appointment.

## 2019-09-28 ENCOUNTER — Other Ambulatory Visit (HOSPITAL_COMMUNITY)
Admission: RE | Admit: 2019-09-28 | Discharge: 2019-09-28 | Disposition: A | Discharge status: Home / Self Care | Payer: Medicare HMO | Source: Ambulatory Visit | Attending: Orthopedic Surgery | Admitting: Orthopedic Surgery

## 2019-09-28 DIAGNOSIS — Z20822 Contact with and (suspected) exposure to covid-19: Secondary | ICD-10-CM | POA: Insufficient documentation

## 2019-09-28 DIAGNOSIS — Z01812 Encounter for preprocedural laboratory examination: Secondary | ICD-10-CM | POA: Insufficient documentation

## 2019-09-28 LAB — SARS CORONAVIRUS 2 (TAT 6-24 HRS): SARS Coronavirus 2: NEGATIVE

## 2019-09-30 ENCOUNTER — Other Ambulatory Visit: Payer: Self-pay | Admitting: *Deleted

## 2019-09-30 DIAGNOSIS — M170 Bilateral primary osteoarthritis of knee: Secondary | ICD-10-CM

## 2019-09-30 MED ORDER — TRAMADOL HCL 50 MG PO TABS: 50.0000 mg | ORAL_TABLET | Freq: Two times a day (BID) | ORAL | 0 refills | Status: DC

## 2019-09-30 NOTE — Progress Notes (Signed)
Anesthesia Chart Review:   Case: [id number] Date/Time: 10/02/19 1037   Procedure: ANTERIOR CERVICAL DECOMPRESSION FUSION CERVICAL 3-4 WITH INSTRUMENTATION AND ALLOGRAFT (N/A )   Anesthesia type: General   Pre-op diagnosis: PROGRESSIVE CERVICAL MYELOPATHY   Location: [facility] OR ROOM 05 / [place]   Surgeons: Phylliss Bob, MD      DISCUSSION:  - Pt is a 74 year old with hx stroke (2018; treated with tPA at [facility]), HTN, DM, OSA. Former smoker (quit 08/08/19).   - At pre-admission testing, glucose was 332. Pt reports he has been out of sensors for his Libre glucose monitor for 2 months and has not been checking his blood glucose at home. Pt has obtained a couple of sensors from endocrinologist and was instructed to use one and carefully monitor and treat (with Lantus) glucose until surgery.   - Pt can hold plavix for 5-7 days per Dr. Hartford Poli   VS: BP 134/76   Pulse 74   Temp 36.4 C (Oral)   Resp 18   Ht 5\' 6"  (1.676 m)   Wt 93.1 kg   SpO2 100%   BMI 33.14 kg/m    PROVIDERS: - PCP is Emeterio Reeve, DO  - Endocrinologist is Kathlene Cote, MD (notes in care everywhere)   - Vascular surgeon is Sanjuana Mae, MD (notes in care everywhere) who sees patient for HTN and hx CVA   LABS:  - glucose 332, HbA1c 10.2.  Pt has not been checking glucose and managing appropriately with Lantus for at least 2 months.   (all labs ordered are listed, but only abnormal results are displayed)  Labs Reviewed  GLUCOSE, CAPILLARY - Abnormal; Notable for the following components:      Result Value   Glucose-Capillary 332 (*)    All other components within normal limits  CBC WITH DIFFERENTIAL/PLATELET - Abnormal; Notable for the following components:   WBC 11.9 (*)    Hemoglobin 17.3 (*)    Neutro Abs 8.3 (*)    Abs Immature Granulocytes 0.09 (*)    All other components within normal limits  COMPREHENSIVE METABOLIC PANEL - Abnormal; Notable for the following components:   Sodium 130 (*)    Chloride  95 (*)    Glucose, Bld 332 (*)    BUN 32 (*)    Creatinine, Ser 1.76 (*)    ALT 54 (*)    GFR calc non Af Amer 38 (*)    GFR calc Af Amer 44 (*)    All other components within normal limits  URINALYSIS, ROUTINE W REFLEX MICROSCOPIC - Abnormal; Notable for the following components:   Glucose, UA >=500 (*)    Protein, ur 30 (*)    Bacteria, UA RARE (*)    All other components within normal limits  HEMOGLOBIN A1C - Abnormal; Notable for the following components:   Hgb A1c MFr Bld 10.2 (*)    All other components within normal limits  SURGICAL PCR SCREEN  APTT  PROTIME-INR  TYPE AND SCREEN  ABO/RH    EKG 09/27/19: NSR. Left axis deviation. RBBB. Inferior infarct, age undetermined.  - Reviewed EKG with Dr. Tobias Alexander. Pt CV sx at pre-admission testing.    CV:  Echo 07/07/17 (care everywhere): - Injection of agitated saline contrast performed to evaluate for possible shunt - The left ventricular size is normal. - There is moderate concentric left ventricular hypertrophy. - Left ventricular systolic function is normal. - LV ejection fraction = 55-60%. - The right ventricle is normal  in size and function. - There is mild mitral regurgitation. - There was insufficient TR detected to calculate RV systolic pressure. - The IVC is normal in size with an inspiratory collapse of greater then 50%,  suggesting normal right atrial pressure. - Injection of agitated saline showed no right-to-left shunt. - Probably no significant change in comparison with the prior study noted    Past Medical History:  Diagnosis Date  . Allergy    seasonal  . Anxiety   . BPH (benign prostatic hyperplasia)   . Cataract   . Diabetes mellitus   . GERD (gastroesophageal reflux disease)   . Hypertension   . Sleep apnea    On CPAP  . Stroke [facility])     Past Surgical History:  Procedure Laterality Date  . [place]   left  . CATARACT EXTRACTION Right   . CHOLECYSTECTOMY  1990  . ERCP  1990  .  Hammer Toe Repair Right 09/16/2016   RT #5  . Partial Excision Phalanx Left 09/16/2016   LT#5  . TONSILLECTOMY AND ADENOIDECTOMY  1998   and sinus for sleep apnea    MEDICATIONS: . aspirin 81 MG chewable tablet  . atorvastatin (LIPITOR) 40 MG tablet  . CHANTIX STARTING MONTH PAK 0.5 MG X 11 & 1 MG X 42 tablet  . Cholecalciferol (VITAMIN D-1000 MAX ST) 1000 units tablet  . cholestyramine (QUESTRAN) 4 g packet  . clobetasol cream (TEMOVATE) 0.05 %  . clopidogrel (PLAVIX) 75 MG tablet  . docusate sodium (COLACE) 100 MG capsule  . dutasteride (AVODART) 0.5 MG capsule  . hydrochlorothiazide (HYDRODIURIL) 25 MG tablet  . insulin glargine (LANTUS) 100 unit/mL SOPN  . metoprolol tartrate (LOPRESSOR) 50 MG tablet  . mupirocin ointment (BACTROBAN) 2 %  . oxyCODONE-acetaminophen (PERCOCET) 10-325 MG tablet  . pantoprazole (PROTONIX) 40 MG tablet  . Semaglutide (OZEMPIC, 1 MG/DOSE, [place])  . spironolactone (ALDACTONE) 25 MG tablet  . tamsulosin (FLOMAX) 0.4 MG CAPS capsule  . traMADol (ULTRAM) 50 MG tablet  . triamcinolone (KENALOG) 0.1 % paste   No current facility-administered medications for this encounter.   - Pt can hold plavix for 5-7 days per Dr. Hartford Poli   If glucose acceptable day of surgery, I anticipate pt can proceed with surgery as scheduled.  Willeen Cass, FNP-BC [facility] Stay [facility]/Anesthesiology Phone: [phone] 09/30/2019 1:11 PM

## 2019-09-30 NOTE — Anesthesia Preprocedure Evaluation (Addendum)
Anesthesia Evaluation  Patient identified by MRN, date of birth, ID band Patient awake    Reviewed: Allergy & Precautions, NPO status , Patient's Chart, lab work & pertinent test results  Airway Mallampati: I  TM Distance: >3 FB Neck ROM: Full    Dental  (+) Teeth Intact, Dental Advisory Given   Pulmonary sleep apnea and Continuous Positive Airway Pressure Ventilation , former smoker,  15 pack year history, quit 08/2019  OSA- polycythemic   Pulmonary exam normal breath sounds clear to auscultation       Cardiovascular hypertension, Pt. on medications and Pt. on home beta blockers Normal cardiovascular exam Rhythm:Regular Rate:Normal  Echo 07/07/17 (care everywhere): - Injection of agitated saline contrast performed to evaluate for possible shunt - The left ventricular size is normal. - There is moderate concentric left ventricular hypertrophy. - Left ventricular systolic function is normal. - LV ejection fraction = 55-60%. - The right ventricle is normal in size and function. - There is mild mitral regurgitation. - There was insufficient TR detected to calculate RV systolic pressure. - The IVC is normal in size with an inspiratory collapse of greater then 50%,  suggesting normal right atrial pressure. - Injection of agitated saline showed no right-to-left shunt. - Probably no significant change in comparison with the prior study noted   Neuro/Psych PSYCHIATRIC DISORDERS Anxiety Depression Progressive cervical myelopathy   stroke 2018; treated with tPA  CVA, No Residual Symptoms    GI/Hepatic GERD  Medicated and Controlled,  Endo/Other  diabetes, Poorly Controlled, Type 2, Insulin Dependent, Oral Hypoglycemic AgentsObesity BMI 33  Last A1c 10  At pre-admission testing, glucose was 332. Pt reports he has been out of sensors for his Libre glucose monitor for 2 months and has not been checking his blood glucose at home. Pt has  obtained a couple of sensors from endocrinologist and was instructed to use one and carefully monitor and treat (with Lantus) glucose until surgery.   Renal/GU Renal InsufficiencyRenal diseaseCr 1.76  negative genitourinary   Musculoskeletal  (+) Arthritis , Osteoarthritis,    Abdominal (+) + obese,   Peds  Hematology  (+) Blood dyscrasia, , hct 50   Anesthesia Other Findings   Reproductive/Obstetrics negative OB ROS                           Anesthesia Physical Anesthesia Plan  ASA: III  Anesthesia Plan: General   Post-op Pain Management:    Induction: Intravenous  PONV Risk Score and Plan: 2 and Ondansetron and Treatment may vary due to age or medical condition  Airway Management Planned: Oral ETT  Additional Equipment: None  Intra-op Plan:   Post-operative Plan: Extubation in OR  Informed Consent: I have reviewed the patients History and Physical, chart, labs and discussed the procedure including the risks, benefits and alternatives for the proposed anesthesia with the patient or authorized representative who has indicated his/her understanding and acceptance.     Dental advisory given  Plan Discussed with: CRNA  Anesthesia Plan Comments: (Endotool for glucose management)       Anesthesia Quick Evaluation

## 2019-09-30 NOTE — Telephone Encounter (Signed)
Routing to provider  

## 2019-10-01 NOTE — Telephone Encounter (Signed)
Dr Zenaida Deed sure sent whatever he thought was appropriate

## 2019-10-02 ENCOUNTER — Observation Stay (HOSPITAL_COMMUNITY)
Admission: RE | Admit: 2019-10-02 | Discharge: 2019-10-03 | Disposition: A | Discharge status: Home / Self Care | Payer: Medicare HMO | Source: Ambulatory Visit | Attending: Orthopedic Surgery | Admitting: Orthopedic Surgery

## 2019-10-02 ENCOUNTER — Other Ambulatory Visit: Payer: Self-pay

## 2019-10-02 ENCOUNTER — Ambulatory Visit (HOSPITAL_COMMUNITY): Payer: Medicare HMO | Admitting: Vascular Surgery

## 2019-10-02 ENCOUNTER — Ambulatory Visit (HOSPITAL_COMMUNITY): Payer: Medicare HMO

## 2019-10-02 ENCOUNTER — Encounter (HOSPITAL_COMMUNITY): Payer: Self-pay | Admitting: Orthopedic Surgery

## 2019-10-02 ENCOUNTER — Encounter (HOSPITAL_COMMUNITY)
Admission: RE | Disposition: A | Discharge status: Home / Self Care | Payer: Self-pay | Source: Ambulatory Visit | Attending: Orthopedic Surgery

## 2019-10-02 ENCOUNTER — Ambulatory Visit (HOSPITAL_COMMUNITY): Payer: Medicare HMO | Admitting: Registered Nurse

## 2019-10-02 DIAGNOSIS — Z794 Long term (current) use of insulin: Secondary | ICD-10-CM | POA: Diagnosis not present

## 2019-10-02 DIAGNOSIS — G9520 Unspecified cord compression: Secondary | ICD-10-CM | POA: Diagnosis not present

## 2019-10-02 DIAGNOSIS — E119 Type 2 diabetes mellitus without complications: Secondary | ICD-10-CM | POA: Insufficient documentation

## 2019-10-02 DIAGNOSIS — M4802 Spinal stenosis, cervical region: Secondary | ICD-10-CM | POA: Diagnosis not present

## 2019-10-02 DIAGNOSIS — G9529 Other cord compression: Secondary | ICD-10-CM | POA: Diagnosis not present

## 2019-10-02 DIAGNOSIS — K219 Gastro-esophageal reflux disease without esophagitis: Secondary | ICD-10-CM | POA: Insufficient documentation

## 2019-10-02 DIAGNOSIS — G959 Disease of spinal cord, unspecified: Secondary | ICD-10-CM | POA: Diagnosis present

## 2019-10-02 DIAGNOSIS — Z7982 Long term (current) use of aspirin: Secondary | ICD-10-CM | POA: Diagnosis not present

## 2019-10-02 DIAGNOSIS — Z87891 Personal history of nicotine dependence: Secondary | ICD-10-CM | POA: Insufficient documentation

## 2019-10-02 DIAGNOSIS — N400 Enlarged prostate without lower urinary tract symptoms: Secondary | ICD-10-CM | POA: Insufficient documentation

## 2019-10-02 DIAGNOSIS — Z8673 Personal history of transient ischemic attack (TIA), and cerebral infarction without residual deficits: Secondary | ICD-10-CM | POA: Diagnosis not present

## 2019-10-02 DIAGNOSIS — Z79899 Other long term (current) drug therapy: Secondary | ICD-10-CM | POA: Insufficient documentation

## 2019-10-02 DIAGNOSIS — E669 Obesity, unspecified: Secondary | ICD-10-CM | POA: Diagnosis not present

## 2019-10-02 DIAGNOSIS — I10 Essential (primary) hypertension: Secondary | ICD-10-CM | POA: Insufficient documentation

## 2019-10-02 DIAGNOSIS — Z6833 Body mass index (BMI) 33.0-33.9, adult: Secondary | ICD-10-CM | POA: Diagnosis not present

## 2019-10-02 DIAGNOSIS — M4322 Fusion of spine, cervical region: Secondary | ICD-10-CM | POA: Diagnosis not present

## 2019-10-02 DIAGNOSIS — G9589 Other specified diseases of spinal cord: Principal | ICD-10-CM | POA: Insufficient documentation

## 2019-10-02 DIAGNOSIS — E1165 Type 2 diabetes mellitus with hyperglycemia: Secondary | ICD-10-CM | POA: Diagnosis not present

## 2019-10-02 DIAGNOSIS — G4730 Sleep apnea, unspecified: Secondary | ICD-10-CM | POA: Diagnosis not present

## 2019-10-02 DIAGNOSIS — M5000 Cervical disc disorder with myelopathy, unspecified cervical region: Secondary | ICD-10-CM | POA: Diagnosis not present

## 2019-10-02 DIAGNOSIS — Z419 Encounter for procedure for purposes other than remedying health state, unspecified: Secondary | ICD-10-CM

## 2019-10-02 HISTORY — PX: ANTERIOR CERVICAL DECOMP/DISCECTOMY FUSION: SHX1161

## 2019-10-02 LAB — GLUCOSE, CAPILLARY
Glucose-Capillary: 225 mg/dL — ABNORMAL HIGH (ref 70–99)
Glucose-Capillary: 182 mg/dL — ABNORMAL HIGH (ref 70–99)
Glucose-Capillary: 196 mg/dL — ABNORMAL HIGH (ref 70–99)
Glucose-Capillary: 203 mg/dL — ABNORMAL HIGH (ref 70–99)
Glucose-Capillary: 196 mg/dL — ABNORMAL HIGH (ref 70–99)

## 2019-10-02 IMAGING — RF DG C-ARM 1-60 MIN
1 series · 1 of 1 positions shown · non-contrast
Comparison: [DATE]

CLINICAL DATA: C3-4 fusion

EXAM:
DG C-ARM 1-60 MIN; DG CERVICAL SPINE - 1 VIEW

[Series 1: run · 1 of 1 slices shown]
[im 1/1]
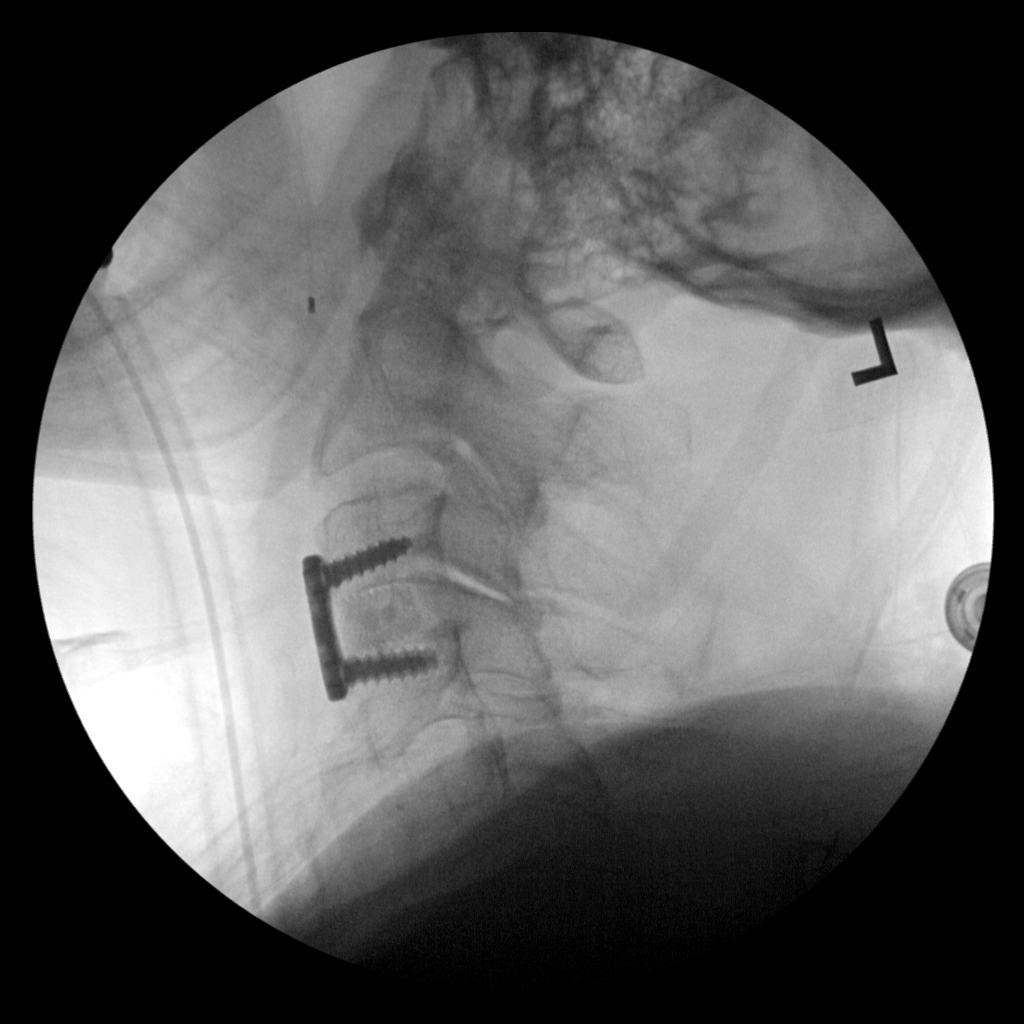

[1 of 1 positions shown; findings below may reference images not displayed]

FLUOROSCOPY TIME:  Fluoroscopy Time:  16 seconds

Radiation Exposure Index (if provided by the fluoroscopic device):
2.46 mGy

Number of Acquired Spot Images: 1
FINDINGS: Lateral spot film of the cervical spine reveals interbody fusion at
C3-4 with anterior fixation.
IMPRESSION: C3-4 fusion.

## 2019-10-02 IMAGING — RF DG C-ARM 1-60 MIN
1 series · 1 of 1 positions shown · non-contrast
Comparison: [DATE]

CLINICAL DATA: C3-4 fusion

EXAM:
DG C-ARM 1-60 MIN; DG CERVICAL SPINE - 1 VIEW

[Series 1: run · 1 of 1 slices shown]
[im 1/1]
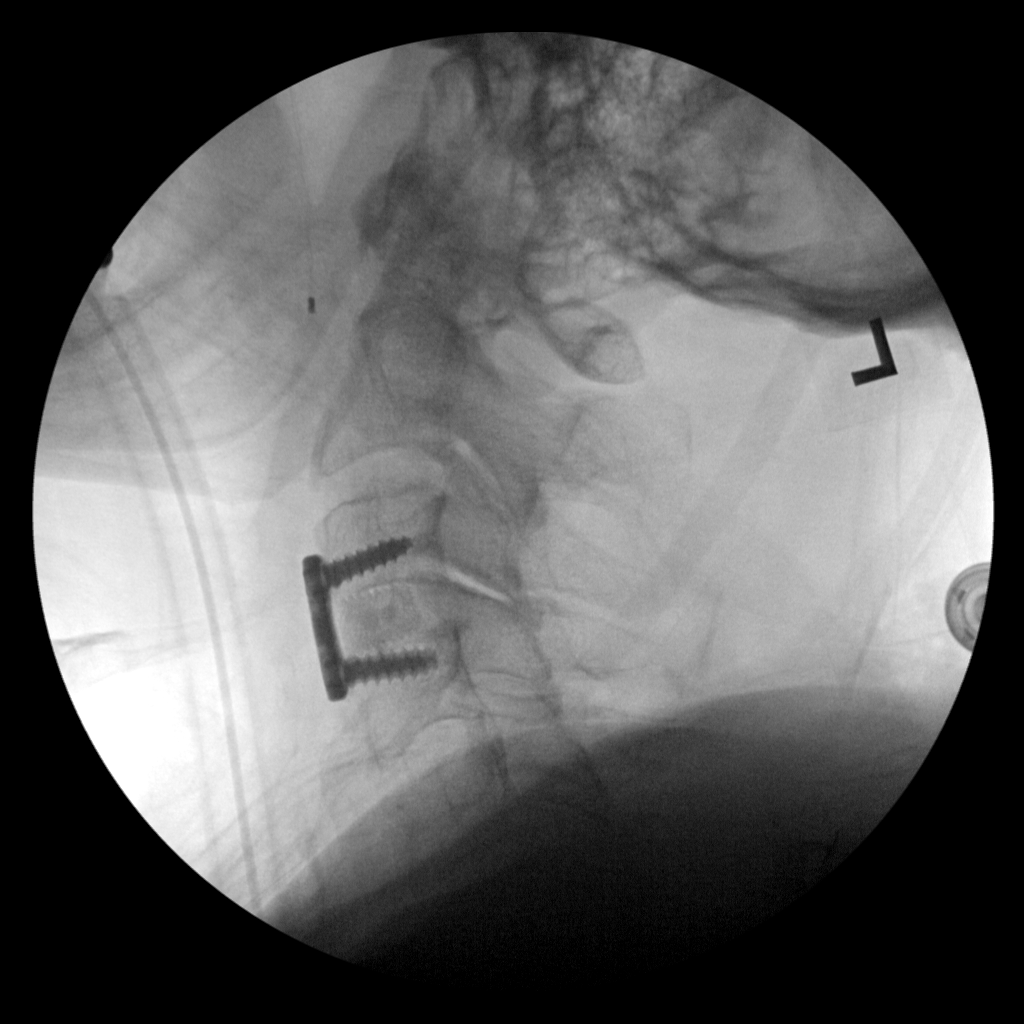

[1 of 1 positions shown; findings below may reference images not displayed]

FLUOROSCOPY TIME:  Fluoroscopy Time:  16 seconds

Radiation Exposure Index (if provided by the fluoroscopic device):
2.46 mGy

Number of Acquired Spot Images: 1
FINDINGS: Lateral spot film of the cervical spine reveals interbody fusion at
C3-4 with anterior fixation.
IMPRESSION: C3-4 fusion.

## 2019-10-02 IMAGING — RF DG CERVICAL SPINE 1V
1 series · 1 of 1 positions shown · non-contrast
Comparison: [DATE]

CLINICAL DATA: C3-4 fusion

EXAM:
DG C-ARM 1-60 MIN; DG CERVICAL SPINE - 1 VIEW

[Series 1: run · 1 of 1 slices shown]
[im 1/1]
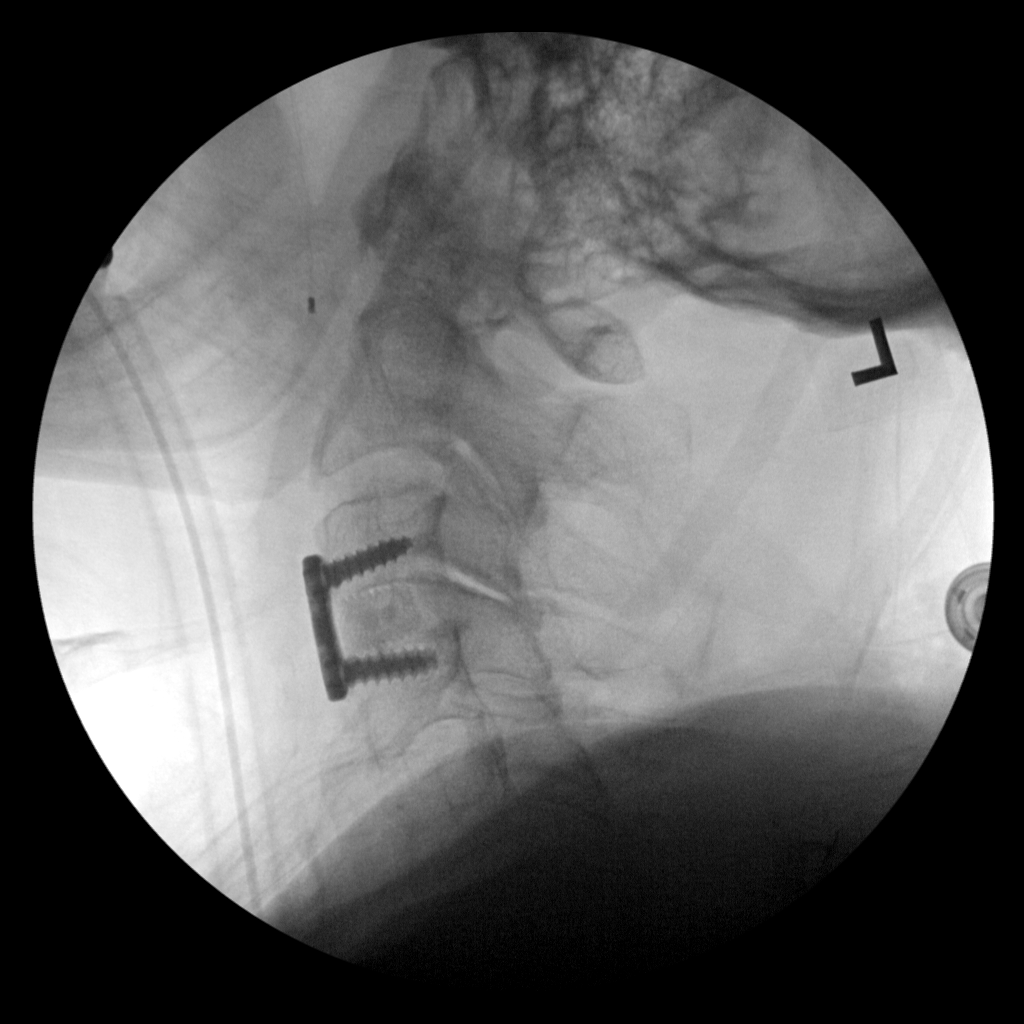

[1 of 1 positions shown; findings below may reference images not displayed]

FLUOROSCOPY TIME:  Fluoroscopy Time:  16 seconds

Radiation Exposure Index (if provided by the fluoroscopic device):
2.46 mGy

Number of Acquired Spot Images: 1
FINDINGS: Lateral spot film of the cervical spine reveals interbody fusion at
C3-4 with anterior fixation.
IMPRESSION: C3-4 fusion.

## 2019-10-02 SURGERY — ANTERIOR CERVICAL DECOMPRESSION/DISCECTOMY FUSION 1 LEVEL
Anesthesia: General | Site: Neck

## 2019-10-02 MED ORDER — BUPIVACAINE-EPINEPHRINE 0.25% -1:200000 IJ SOLN
INTRAMUSCULAR | Status: DC | PRN
  Administered 2019-10-02: 7 mL

## 2019-10-02 MED ORDER — LIDOCAINE 2% (20 MG/ML) 5 ML SYRINGE
INTRAMUSCULAR | Status: DC | PRN
  Administered 2019-10-02: 60 mg via INTRAVENOUS

## 2019-10-02 MED ORDER — DIAZEPAM 5 MG PO TABS: 5.0000 mg | ORAL_TABLET | Freq: Four times a day (QID) | ORAL | Status: DC | PRN

## 2019-10-02 MED ORDER — SENNOSIDES-DOCUSATE SODIUM 8.6-50 MG PO TABS: 1.0000 | ORAL_TABLET | Freq: Every evening | ORAL | Status: DC | PRN

## 2019-10-02 MED ORDER — ONDANSETRON HCL 4 MG/2ML IJ SOLN: 4.0000 mg | Freq: Four times a day (QID) | INTRAMUSCULAR | Status: DC | PRN

## 2019-10-02 MED ORDER — OXYCODONE HCL 5 MG PO TABS
ORAL_TABLET | ORAL | Status: AC
  Administered 2019-10-02: 5 mg via ORAL
  Filled 2019-10-02: qty 1

## 2019-10-02 MED ORDER — ONDANSETRON HCL 4 MG/2ML IJ SOLN
INTRAMUSCULAR | Status: AC
  Filled 2019-10-02: qty 2

## 2019-10-02 MED ORDER — ONDANSETRON HCL 4 MG/2ML IJ SOLN
INTRAMUSCULAR | Status: DC | PRN
  Administered 2019-10-02: 4 mg via INTRAVENOUS

## 2019-10-02 MED ORDER — SODIUM CHLORIDE 0.9% FLUSH: 3.0000 mL | INTRAVENOUS | Status: DC | PRN

## 2019-10-02 MED ORDER — OXYCODONE HCL 5 MG/5ML PO SOLN: 5.0000 mg | Freq: Once | ORAL | Status: AC | PRN

## 2019-10-02 MED ORDER — MENTHOL 3 MG MT LOZG: 1.0000 | LOZENGE | OROMUCOSAL | Status: DC | PRN

## 2019-10-02 MED ORDER — CEFAZOLIN SODIUM-DEXTROSE 2-4 GM/100ML-% IV SOLN
2.0000 g | Freq: Three times a day (TID) | INTRAVENOUS | Status: AC
  Administered 2019-10-02 – 2019-10-03 (×2): 2 g via INTRAVENOUS
  Filled 2019-10-02 (×2): qty 100

## 2019-10-02 MED ORDER — LACTATED RINGERS IV SOLN
INTRAVENOUS | Status: DC | PRN
Start: 2019-10-02 — End: 2019-10-02

## 2019-10-02 MED ORDER — PROPOFOL 10 MG/ML IV BOLUS
INTRAVENOUS | Status: DC | PRN
  Administered 2019-10-02: 150 mg via INTRAVENOUS

## 2019-10-02 MED ORDER — ONDANSETRON HCL 4 MG/2ML IJ SOLN: 4.0000 mg | Freq: Once | INTRAMUSCULAR | Status: DC | PRN

## 2019-10-02 MED ORDER — PHENYLEPHRINE 40 MCG/ML (10ML) SYRINGE FOR IV PUSH (FOR BLOOD PRESSURE SUPPORT)
PREFILLED_SYRINGE | INTRAVENOUS | Status: DC | PRN
  Administered 2019-10-02: 120 ug via INTRAVENOUS
  Administered 2019-10-02: 40 ug via INTRAVENOUS

## 2019-10-02 MED ORDER — INSULIN GLARGINE 100 UNITS/ML SOLOSTAR PEN: 25.0000 [IU] | PEN_INJECTOR | Freq: Every evening | SUBCUTANEOUS | Status: DC | PRN

## 2019-10-02 MED ORDER — VARENICLINE TARTRATE 1 MG PO TABS
1.0000 mg | ORAL_TABLET | Freq: Two times a day (BID) | ORAL | Status: DC
  Administered 2019-10-02: 1 mg via ORAL
  Filled 2019-10-02: qty 1

## 2019-10-02 MED ORDER — FENTANYL CITRATE (PF) 250 MCG/5ML IJ SOLN
INTRAMUSCULAR | Status: AC
  Filled 2019-10-02: qty 5

## 2019-10-02 MED ORDER — VITAMIN D 25 MCG (1000 UNIT) PO TABS
1000.0000 [IU] | ORAL_TABLET | Freq: Every day | ORAL | Status: DC
  Administered 2019-10-02 – 2019-10-03 (×2): 1000 [IU] via ORAL
  Filled 2019-10-02 (×4): qty 1

## 2019-10-02 MED ORDER — PROPOFOL 10 MG/ML IV BOLUS
INTRAVENOUS | Status: AC
  Filled 2019-10-02: qty 20

## 2019-10-02 MED ORDER — OXYCODONE-ACETAMINOPHEN 5-325 MG PO TABS
1.0000 | ORAL_TABLET | ORAL | Status: DC | PRN
  Administered 2019-10-02 – 2019-10-03 (×5): 2 via ORAL
  Filled 2019-10-02 (×5): qty 2

## 2019-10-02 MED ORDER — LIDOCAINE 2% (20 MG/ML) 5 ML SYRINGE
INTRAMUSCULAR | Status: AC
  Filled 2019-10-02: qty 5

## 2019-10-02 MED ORDER — HYDROMORPHONE HCL 1 MG/ML IJ SOLN
0.2500 mg | INTRAMUSCULAR | Status: DC | PRN
  Administered 2019-10-02: 0.5 mg via INTRAVENOUS

## 2019-10-02 MED ORDER — TAMSULOSIN HCL 0.4 MG PO CAPS
0.4000 mg | ORAL_CAPSULE | Freq: Every day | ORAL | Status: DC
  Administered 2019-10-03: 0.4 mg via ORAL
  Filled 2019-10-02: qty 1

## 2019-10-02 MED ORDER — PANTOPRAZOLE SODIUM 40 MG IV SOLR: 40.0000 mg | Freq: Every day | INTRAVENOUS | Status: DC

## 2019-10-02 MED ORDER — METHOCARBAMOL 500 MG PO TABS
500.0000 mg | ORAL_TABLET | Freq: Four times a day (QID) | ORAL | Status: DC | PRN
  Administered 2019-10-02 – 2019-10-03 (×3): 500 mg via ORAL
  Filled 2019-10-02 (×3): qty 1

## 2019-10-02 MED ORDER — ROCURONIUM BROMIDE 10 MG/ML (PF) SYRINGE
PREFILLED_SYRINGE | INTRAVENOUS | Status: DC | PRN
  Administered 2019-10-02: 60 mg via INTRAVENOUS

## 2019-10-02 MED ORDER — FLEET ENEMA 7-19 GM/118ML RE ENEM: 1.0000 | ENEMA | Freq: Once | RECTAL | Status: DC | PRN

## 2019-10-02 MED ORDER — ONDANSETRON HCL 4 MG PO TABS: 4.0000 mg | ORAL_TABLET | Freq: Four times a day (QID) | ORAL | Status: DC | PRN

## 2019-10-02 MED ORDER — ROCURONIUM BROMIDE 10 MG/ML (PF) SYRINGE
PREFILLED_SYRINGE | INTRAVENOUS | Status: AC
  Filled 2019-10-02: qty 10

## 2019-10-02 MED ORDER — INSULIN ASPART 100 UNIT/ML ~~LOC~~ SOLN
0.0000 [IU] | Freq: Three times a day (TID) | SUBCUTANEOUS | Status: DC
  Administered 2019-10-02: 3 [IU] via SUBCUTANEOUS
  Administered 2019-10-03: 1 [IU] via SUBCUTANEOUS

## 2019-10-02 MED ORDER — SUGAMMADEX SODIUM 200 MG/2ML IV SOLN
INTRAVENOUS | Status: DC | PRN
  Administered 2019-10-02 (×2): 100 mg via INTRAVENOUS

## 2019-10-02 MED ORDER — PHENYLEPHRINE 40 MCG/ML (10ML) SYRINGE FOR IV PUSH (FOR BLOOD PRESSURE SUPPORT)
PREFILLED_SYRINGE | INTRAVENOUS | Status: AC
  Filled 2019-10-02: qty 10

## 2019-10-02 MED ORDER — SODIUM CHLORIDE 0.9% FLUSH
3.0000 mL | Freq: Two times a day (BID) | INTRAVENOUS | Status: DC
  Administered 2019-10-02 (×2): 3 mL via INTRAVENOUS

## 2019-10-02 MED ORDER — THROMBIN 20000 UNITS EX SOLR
CUTANEOUS | Status: AC
  Filled 2019-10-02: qty 20000

## 2019-10-02 MED ORDER — 0.9 % SODIUM CHLORIDE (POUR BTL) OPTIME
TOPICAL | Status: DC | PRN
  Administered 2019-10-02: 1000 mL

## 2019-10-02 MED ORDER — ZOLPIDEM TARTRATE 5 MG PO TABS: 5.0000 mg | ORAL_TABLET | Freq: Every evening | ORAL | Status: DC | PRN

## 2019-10-02 MED ORDER — HYDROCHLOROTHIAZIDE 25 MG PO TABS
25.0000 mg | ORAL_TABLET | Freq: Every day | ORAL | Status: DC
  Administered 2019-10-03: 25 mg via ORAL
  Filled 2019-10-02: qty 1

## 2019-10-02 MED ORDER — FENTANYL CITRATE (PF) 250 MCG/5ML IJ SOLN
INTRAMUSCULAR | Status: DC | PRN
  Administered 2019-10-02: 100 ug via INTRAVENOUS
  Administered 2019-10-02: 50 ug via INTRAVENOUS

## 2019-10-02 MED ORDER — ATORVASTATIN CALCIUM 40 MG PO TABS
40.0000 mg | ORAL_TABLET | Freq: Every day | ORAL | Status: DC
  Administered 2019-10-02: 40 mg via ORAL
  Filled 2019-10-02: qty 1

## 2019-10-02 MED ORDER — METOPROLOL TARTRATE 50 MG PO TABS
50.0000 mg | ORAL_TABLET | Freq: Every day | ORAL | Status: DC
  Administered 2019-10-03: 50 mg via ORAL
  Filled 2019-10-02: qty 1
  Filled 2019-10-02: qty 2

## 2019-10-02 MED ORDER — HYDROMORPHONE HCL 1 MG/ML IJ SOLN
INTRAMUSCULAR | Status: AC
  Administered 2019-10-02: 0.5 mg via INTRAVENOUS
  Filled 2019-10-02: qty 1

## 2019-10-02 MED ORDER — PANTOPRAZOLE SODIUM 40 MG PO TBEC
40.0000 mg | DELAYED_RELEASE_TABLET | Freq: Every day | ORAL | Status: DC
  Administered 2019-10-02: 40 mg via ORAL
  Filled 2019-10-02: qty 1

## 2019-10-02 MED ORDER — CEFAZOLIN SODIUM-DEXTROSE 2-4 GM/100ML-% IV SOLN
INTRAVENOUS | Status: AC
  Filled 2019-10-02: qty 100

## 2019-10-02 MED ORDER — OXYCODONE HCL 5 MG PO TABS: 5.0000 mg | ORAL_TABLET | Freq: Once | ORAL | Status: AC | PRN

## 2019-10-02 MED ORDER — EPINEPHRINE PF 1 MG/ML IJ SOLN
INTRAMUSCULAR | Status: AC
  Filled 2019-10-02: qty 1

## 2019-10-02 MED ORDER — ACETAMINOPHEN 500 MG PO TABS
1000.0000 mg | ORAL_TABLET | Freq: Once | ORAL | Status: AC
  Administered 2019-10-02: 1000 mg via ORAL
  Filled 2019-10-02: qty 2

## 2019-10-02 MED ORDER — ORAL CARE MOUTH RINSE: 15.0000 mL | Freq: Once | OROMUCOSAL | Status: AC

## 2019-10-02 MED ORDER — ACETAMINOPHEN 650 MG RE SUPP: 650.0000 mg | RECTAL | Status: DC | PRN

## 2019-10-02 MED ORDER — ACETAMINOPHEN 325 MG PO TABS: 650.0000 mg | ORAL_TABLET | ORAL | Status: DC | PRN

## 2019-10-02 MED ORDER — CEFAZOLIN SODIUM-DEXTROSE 2-3 GM-%(50ML) IV SOLR
INTRAVENOUS | Status: DC | PRN
  Administered 2019-10-02: 2 g via INTRAVENOUS

## 2019-10-02 MED ORDER — PANTOPRAZOLE SODIUM 40 MG PO TBEC: 40.0000 mg | DELAYED_RELEASE_TABLET | Freq: Every day | ORAL | Status: DC

## 2019-10-02 MED ORDER — SPIRONOLACTONE 25 MG PO TABS
25.0000 mg | ORAL_TABLET | Freq: Every day | ORAL | Status: DC
  Administered 2019-10-02 – 2019-10-03 (×2): 25 mg via ORAL
  Filled 2019-10-02 (×2): qty 1

## 2019-10-02 MED ORDER — THROMBIN 20000 UNITS EX SOLR: CUTANEOUS | Status: DC | PRN

## 2019-10-02 MED ORDER — ALUM & MAG HYDROXIDE-SIMETH 200-200-20 MG/5ML PO SUSP: 30.0000 mL | Freq: Four times a day (QID) | ORAL | Status: DC | PRN

## 2019-10-02 MED ORDER — INSULIN ASPART 100 UNIT/ML ~~LOC~~ SOLN: 0.0000 [IU] | Freq: Every day | SUBCUTANEOUS | Status: DC

## 2019-10-02 MED ORDER — INSULIN REGULAR BOLUS VIA INFUSION
10.0000 [IU] | Freq: Once | INTRAVENOUS | Status: AC
  Administered 2019-10-02: 10 [IU] via INTRAVENOUS
  Filled 2019-10-02: qty 10

## 2019-10-02 MED ORDER — EPHEDRINE SULFATE-NACL 50-0.9 MG/10ML-% IV SOSY
PREFILLED_SYRINGE | INTRAVENOUS | Status: DC | PRN
  Administered 2019-10-02: 5 mg via INTRAVENOUS

## 2019-10-02 MED ORDER — CHLORHEXIDINE GLUCONATE 0.12 % MT SOLN
OROMUCOSAL | Status: AC
  Administered 2019-10-02: 15 mL via OROMUCOSAL
  Filled 2019-10-02: qty 15

## 2019-10-02 MED ORDER — EPHEDRINE 5 MG/ML INJ
INTRAVENOUS | Status: AC
  Filled 2019-10-02: qty 10

## 2019-10-02 MED ORDER — INSULIN GLARGINE 100 UNIT/ML ~~LOC~~ SOLN
10.0000 [IU] | Freq: Every day | SUBCUTANEOUS | Status: DC
  Administered 2019-10-02: 10 [IU] via SUBCUTANEOUS
  Filled 2019-10-02 (×2): qty 0.1

## 2019-10-02 MED ORDER — CHLORHEXIDINE GLUCONATE 0.12 % MT SOLN: 15.0000 mL | Freq: Once | OROMUCOSAL | Status: AC

## 2019-10-02 MED ORDER — PHENYLEPHRINE HCL-NACL 10-0.9 MG/250ML-% IV SOLN
INTRAVENOUS | Status: DC | PRN
  Administered 2019-10-02: 20 ug/min via INTRAVENOUS

## 2019-10-02 MED ORDER — PHENOL 1.4 % MT LIQD: 1.0000 | OROMUCOSAL | Status: DC | PRN

## 2019-10-02 MED ORDER — BISACODYL 5 MG PO TBEC: 5.0000 mg | DELAYED_RELEASE_TABLET | Freq: Every day | ORAL | Status: DC | PRN

## 2019-10-02 MED ORDER — SODIUM CHLORIDE 0.9 % IV SOLN
250.0000 mL | INTRAVENOUS | Status: DC
  Administered 2019-10-02: 250 mL via INTRAVENOUS

## 2019-10-02 MED ORDER — BUPIVACAINE HCL (PF) 0.25 % IJ SOLN
INTRAMUSCULAR | Status: AC
  Filled 2019-10-02: qty 30

## 2019-10-02 SURGICAL SUPPLY — 77 items
BENZOIN TINCTURE PRP APPL 2/3 (GAUZE/BANDAGES/DRESSINGS) ×3 IMPLANT
BIT DRILL NEURO 2X3.1 SFT TUCH (MISCELLANEOUS) ×1 IMPLANT
BIT DRILL SKYLINE 14 (BIT) ×1
BIT DRILL SKYLINE 14MM (BIT) ×1 IMPLANT
BLADE CLIPPER SURG (BLADE) ×3 IMPLANT
BLADE SURG 15 STRL LF DISP TIS (BLADE) ×1 IMPLANT
BLADE SURG 15 STRL SS (BLADE) ×2
BONE CERV LORDOTIC 14.5X12X9 (Bone Implant) ×3 IMPLANT
BUR MATCHSTICK NEURO 3.0 LAGG (BURR) IMPLANT
CARTRIDGE OIL MAESTRO DRILL (MISCELLANEOUS) ×1 IMPLANT
CLOSURE WOUND 1/2 X4 (GAUZE/BANDAGES/DRESSINGS) ×1
CORD BIPOLAR FORCEPS 12FT (ELECTRODE) ×3 IMPLANT
COVER SURGICAL LIGHT HANDLE (MISCELLANEOUS) IMPLANT
COVER WAND RF STERILE (DRAPES) IMPLANT
DIFFUSER DRILL AIR PNEUMATIC (MISCELLANEOUS) ×3 IMPLANT
DRAIN JACKSON RD 7FR 3/32 (WOUND CARE) IMPLANT
DRAPE C-ARM 42X72 X-RAY (DRAPES) ×3 IMPLANT
DRAPE POUCH INSTRU U-SHP 10X18 (DRAPES) ×3 IMPLANT
DRAPE SURG 17X23 STRL (DRAPES) ×12 IMPLANT
DRILL BIT SKYLINE 14MM (BIT) ×2
DRILL NEURO 2X3.1 SOFT TOUCH (MISCELLANEOUS) ×3
DURAPREP 26ML APPLICATOR (WOUND CARE) ×3 IMPLANT
ELECT COATED BLADE 2.86 ST (ELECTRODE) ×3 IMPLANT
ELECT REM PT RETURN 9FT ADLT (ELECTROSURGICAL) ×3
ELECTRODE REM PT RTRN 9FT ADLT (ELECTROSURGICAL) ×1 IMPLANT
EVACUATOR SILICONE 100CC (DRAIN) IMPLANT
GAUZE 4X4 16PLY RFD (DISPOSABLE) ×3 IMPLANT
GAUZE SPONGE 4X4 12PLY STRL (GAUZE/BANDAGES/DRESSINGS) ×3 IMPLANT
GLOVE BIO SURGEON STRL SZ7 (GLOVE) ×3 IMPLANT
GLOVE BIO SURGEON STRL SZ8 (GLOVE) ×3 IMPLANT
GLOVE BIOGEL PI IND STRL 6.5 (GLOVE) ×1 IMPLANT
GLOVE BIOGEL PI IND STRL 7.0 (GLOVE) ×2 IMPLANT
GLOVE BIOGEL PI IND STRL 8 (GLOVE) ×5 IMPLANT
GLOVE BIOGEL PI INDICATOR 6.5 (GLOVE) ×2
GLOVE BIOGEL PI INDICATOR 7.0 (GLOVE) ×4
GLOVE BIOGEL PI INDICATOR 8 (GLOVE) ×10
GLOVE ECLIPSE 7.5 STRL STRAW (GLOVE) ×6 IMPLANT
GLOVE SURG SS PI 6.5 STRL IVOR (GLOVE) ×9 IMPLANT
GLOVE SURG SS PI 7.0 STRL IVOR (GLOVE) ×6 IMPLANT
GOWN SPEC L3 XXLG W/TWL (GOWN DISPOSABLE) ×6 IMPLANT
GOWN STRL REUS W/ TWL LRG LVL3 (GOWN DISPOSABLE) ×2 IMPLANT
GOWN STRL REUS W/ TWL XL LVL3 (GOWN DISPOSABLE) ×2 IMPLANT
GOWN STRL REUS W/TWL LRG LVL3 (GOWN DISPOSABLE) ×4
GOWN STRL REUS W/TWL XL LVL3 (GOWN DISPOSABLE) ×4
IV CATH 14GX2 1/4 (CATHETERS) ×3 IMPLANT
KIT BASIN OR (CUSTOM PROCEDURE TRAY) ×3 IMPLANT
KIT TURNOVER KIT B (KITS) ×3 IMPLANT
MANIFOLD NEPTUNE II (INSTRUMENTS) IMPLANT
NEEDLE PRECISIONGLIDE 27X1.5 (NEEDLE) ×3 IMPLANT
NEEDLE SPNL 20GX3.5 QUINCKE YW (NEEDLE) ×3 IMPLANT
NS IRRIG 1000ML POUR BTL (IV SOLUTION) ×3 IMPLANT
OIL CARTRIDGE MAESTRO DRILL (MISCELLANEOUS) ×3
PACK ORTHO CERVICAL (CUSTOM PROCEDURE TRAY) ×3 IMPLANT
PAD ARMBOARD 7.5X6 YLW CONV (MISCELLANEOUS) ×9 IMPLANT
PATTIES SURGICAL .5 X.5 (GAUZE/BANDAGES/DRESSINGS) IMPLANT
PATTIES SURGICAL .5 X1 (DISPOSABLE) IMPLANT
PIN DISTRACTION 14 (PIN) ×6 IMPLANT
PLATE ONE LEVEL SKYLINE 14MM (Plate) ×3 IMPLANT
POSITIONER HEAD DONUT 9IN (MISCELLANEOUS) ×3 IMPLANT
SCREW SKYLINE VAR OS 14MM (Screw) ×12 IMPLANT
SPONGE INTESTINAL PEANUT (DISPOSABLE) ×6 IMPLANT
SPONGE SURGIFOAM ABS GEL 100 (HEMOSTASIS) ×3 IMPLANT
STRIP CLOSURE SKIN 1/2X4 (GAUZE/BANDAGES/DRESSINGS) ×2 IMPLANT
SURGIFLO W/THROMBIN 8M KIT (HEMOSTASIS) IMPLANT
SUT MNCRL AB 4-0 PS2 18 (SUTURE) ×3 IMPLANT
SUT SILK 4 0 (SUTURE)
SUT SILK 4-0 18XBRD TIE 12 (SUTURE) IMPLANT
SUT VIC AB 2-0 CT2 18 VCP726D (SUTURE) ×3 IMPLANT
SYR BULB IRRIG 60ML STRL (SYRINGE) ×3 IMPLANT
SYR CONTROL 10ML LL (SYRINGE) ×6 IMPLANT
TAPE CLOTH 4X10 WHT NS (GAUZE/BANDAGES/DRESSINGS) IMPLANT
TAPE CLOTH SURG 4X10 WHT LF (GAUZE/BANDAGES/DRESSINGS) ×3 IMPLANT
TAPE UMBILICAL COTTON 1/8X30 (MISCELLANEOUS) ×3 IMPLANT
TOWEL GREEN STERILE (TOWEL DISPOSABLE) ×3 IMPLANT
TOWEL GREEN STERILE FF (TOWEL DISPOSABLE) ×3 IMPLANT
WATER STERILE IRR 1000ML POUR (IV SOLUTION) ×3 IMPLANT
YANKAUER SUCT BULB TIP NO VENT (SUCTIONS) ×3 IMPLANT

## 2019-10-02 NOTE — H&P (Signed)
PREOPERATIVE H&P  Chief Complaint: Balance deterioration, lower extremity weakness  HPI: Benjamin Powell is a 74 y.o. male who presents with ongoing deterioration in balance and fine motor skills, as well as weakness  MRI reveals spinal cord compression, C3/4  Patient has failed multiple forms of conservative care and continues to have pain (see office notes for additional details regarding the patient's full course of treatment)  Past Medical History:  Diagnosis Date  . Allergy    seasonal  . Anxiety   . BPH (benign prostatic hyperplasia)   . Cataract   . Diabetes mellitus   . GERD (gastroesophageal reflux disease)   . Hypertension   . Sleep apnea    On CPAP  . Stroke [facility])    Past Surgical History:  Procedure Laterality Date  . [place]   left  . CATARACT EXTRACTION Right   . CHOLECYSTECTOMY  1990  . ERCP  1990  . Hammer Toe Repair Right 09/16/2016   RT #5  . Partial Excision Phalanx Left 09/16/2016   LT#5  . TONSILLECTOMY AND ADENOIDECTOMY  1998   and sinus for sleep apnea   Social History   Socioeconomic History  . Marital status: Married    Spouse name: Not on file  . Number of children: Not on file  . Years of education: Not on file  . Highest education level: Not on file  Occupational History  . Not on file  Tobacco Use  . Smoking status: Former Smoker    Packs/day: 0.30    Years: 50.00    Pack years: 15.00    Types: Cigarettes    Quit date: 08/08/2019    Years since quitting: 0.1  . Smokeless tobacco: Never Used  Substance and Sexual Activity  . Alcohol use: No  . Drug use: No  . Sexual activity: Not on file  Other Topics Concern  . Not on file  Social History Narrative  . Not on file   Social Determinants of Health   Financial Resource Strain:   . Difficulty of Paying Living Expenses:   Food Insecurity:   . Worried About Charity fundraiser in the Last Year:   . Arboriculturist in the Last Year:   Transportation  Needs:   . Film/video editor (Medical):   Marland Kitchen Lack of Transportation (Non-Medical):   Physical Activity:   . Days of Exercise per Week:   . Minutes of Exercise per Session:   Stress:   . Feeling of Stress :   Social Connections:   . Frequency of Communication with Friends and Family:   . Frequency of Social Gatherings with Friends and Family:   . Attends Religious Services:   . Active Member of Clubs or Organizations:   . Attends Archivist Meetings:   Marland Kitchen Marital Status:    Family History  Problem Relation Age of Onset  . Stroke Brother   . Colitis Brother   . Pancreatic cancer Mother   . Colitis Sister   . Colon cancer Neg Hx   . Esophageal cancer Neg Hx   . Rectal cancer Neg Hx   . Stomach cancer Neg Hx    No Known Allergies Prior to Admission medications   Medication Sig Start Date End Date Taking? Authorizing Provider  aspirin 81 MG chewable tablet Chew 81 mg by mouth daily.   Yes [provider]  atorvastatin (LIPITOR) 40 MG tablet Take 1 tablet (40 mg  total) by mouth daily. Patient taking differently: Take 40 mg by mouth at bedtime.  10/06/17  Yes Alexander, Natalie, DO  CHANTIX STARTING MONTH PAK 0.5 MG X 11 & 1 MG X 42 tablet Take 0.5 mg by mouth in the morning and at bedtime. 08/23/19  Yes [provider]  Cholecalciferol (VITAMIN D-1000 MAX ST) 1000 units tablet Take 1 tablet (1,000 Units total) by mouth daily. 10/06/17  Yes Emeterio Reeve, DO  clobetasol cream (TEMOVATE) [id number] % Apply 1 application topically 2 (two) times daily as needed (irritation).   Yes [provider]  clopidogrel (PLAVIX) 75 MG tablet Take 75 mg by mouth daily.   Yes [provider]  hydrochlorothiazide (HYDRODIURIL) 25 MG tablet Take 25 mg by mouth daily.   Yes [provider]  insulin glargine (LANTUS) 100 unit/mL SOPN Inject 25-30 Units into the skin at bedtime as needed (blood sugar of 230 or above).    Yes [provider]    metoprolol tartrate (LOPRESSOR) 50 MG tablet Take 50 mg by mouth daily.  01/13/17  Yes [provider]  pantoprazole (PROTONIX) 40 MG tablet Take 1 tablet (40 mg total) by mouth daily. 08/05/19  Yes Jessup, Joy, NP  Semaglutide (OZEMPIC, 1 MG/DOSE, [place]) Inject 1 mg into the skin every 7 (seven) days.   Yes [provider]  spironolactone (ALDACTONE) 25 MG tablet Take 25 mg by mouth daily.   Yes [provider]  tamsulosin (FLOMAX) 0.4 MG CAPS capsule TAKE ONE CAPSULE BY MOUTH DAILY Patient taking differently: Take 0.4 mg by mouth daily.  10/22/18  Yes Breeback, Jade L, PA-C  cholestyramine (QUESTRAN) 4 g packet Take 1 packet (4 g total) by mouth 2 (two) times daily. Patient not taking: Reported on 09/13/2019 04/22/19   Ladene Artist, MD  docusate sodium (COLACE) 100 MG capsule Take 1 capsule (100 mg total) by mouth 3 (three) times daily as needed. Patient not taking: Reported on 09/13/2019 06/17/19   Silverio Decamp, MD  dutasteride (AVODART) 0.5 MG capsule TAKE ONE CAPSULE BY MOUTH DAILY (NEED APPOINTMENT) Patient not taking: Reported on 09/13/2019 04/01/19   Emeterio Reeve, DO  mupirocin ointment Drue Stager) 2 % Apply to wound 3 times daily for 5 days Patient not taking: Reported on 09/13/2019 07/08/19   Noe Gens, PA-C  traMADol (ULTRAM) 50 MG tablet Take 1 tablet (50 mg total) by mouth 2 (two) times daily. 09/30/19   Silverio Decamp, MD  triamcinolone (KENALOG) 0.1 % paste Apply to cracked lips up to twice a day as needed. Patient not taking: Reported on 09/13/2019 03/20/15   Marcial Pacas, DO     All other systems have been reviewed and were otherwise negative with the exception of those mentioned in the HPI and as above.  Physical Exam: There were no vitals filed for this visit.  There is no height or weight on file to calculate BMI.  General: Alert, no acute distress Cardiovascular: No pedal edema Respiratory: No cyanosis, no use of accessory  musculature Skin: No lesions in the area of chief complaint Neurologic: Sensation intact distally Psychiatric: Patient is competent for consent with normal mood and affect Lymphatic: No axillary or cervical lymphadenopathy   Assessment/Plan: PROGRESSIVE CERVICAL MYELOPATHY Plan for Procedure(s): ANTERIOR CERVICAL DECOMPRESSION FUSION CERVICAL 3-4 WITH INSTRUMENTATION AND ALLOGRAFT   Norva Karvonen, MD 10/02/2019 6:38 AM

## 2019-10-02 NOTE — Op Note (Signed)
PATIENT NAME: Benjamin Powell RECORD NO.:   [id number]    DATE OF BIRTH: July 14, 1945   DATE OF PROCEDURE: 10/02/2019                               OPERATIVE REPORT     PREOPERATIVE DIAGNOSES: 1. Progressive cervical myelopathy 2. Spinal stenosis spanning, including spinal cord compression and myelomalacia C3/4   POSTOPERATIVE DIAGNOSES: 1. Progressive cervical myelopathy 2. Spinal stenosis spanning, including spinal cord compression and myelomalacia C3/4   PROCEDURE: 1. Anterior cervical decompression and fusion C3/4 2. Placement of anterior instrumentation, C3/4 3. Insertion of structural allograft x1 (VG-2 intervertebral allograft spacer). 4. Intraoperative use of fluoroscopy. 5. Use of morselized allograft - ViviGen.   SURGEON:  Phylliss Bob, MD   ASSISTANT:  Pricilla Holm, PA-C.   ANESTHESIA:  General endotracheal anesthesia.   COMPLICATIONS:  None.   DISPOSITION:  Stable.   ESTIMATED BLOOD LOSS:  Minimal.   INDICATIONS FOR SURGERY:  Briefly, Mr. Haler is a pleasant 74 -year- old male, who did present to me with progressive upper and lower extremity weakness.  The etiology of this was unclear.  I did review an MRI of his lumbar spine, which did not explain his symptoms.  I then proceeded with an MRI of his cervical and thoracic spine.  Cord compression was identified at C3-4, as was myelomalacia.  I did feel the compression may very well have been contributing to his symptoms, at least partially.  I did clearly express to him that there may be other conditions explaining his progressive weakness, but given the cord compression that was identified, we did discuss proceeding with an ACDF procedure at C3-4, to decompress the spinal canal and stabilize the spinal canal across the C3-4 level. The patient was fully aware of the risks and limitations of surgery as outlined in my preoperative note.   OPERATIVE DETAILS:  On 10/02/2019, the patient was brought  to surgery and general endotracheal anesthesia was administered.  The patient was placed supine on the hospital bed. The neck was gently extended.  All bony prominences were meticulously padded.  The neck was prepped and draped in the usual sterile fashion.  At this point, I did make a left-sided transverse incision.  The platysma was incised.  A Smith-Robinson approach was used and the anterior spine was identified. A self-retaining retractor was placed.  I then subperiosteally exposed the vertebral bodies from C3-4.  Caspar pins were then placed into the C3 and C4 vertebral bodies and distraction was applied.  A thorough and complete C3-4 intervertebral diskectomy was performed.  The posterior longitudinal ligament was identified and entered using a nerve hook.  I then used #1 followed by #2 Kerrison to perform a thorough and complete intervertebral diskectomy.  The spinal canal was thoroughly decompressed.  The endplates were then prepared and the appropriate-sized intervertebral allograft spacer was then tamped into position in the usual fashion. The Caspar pins  then were removed and bone wax was placed in their place. The appropriate-sized anterior cervical plate was placed over the anterior spine. 14 mm variable angle screws were placed, 2 in each vertebral body from C3-C4 for a total of 4 vertebral body screws.  The screws were then locked to the plate using the Cam locking mechanism.  I was very pleased with the final fluoroscopic images.  The wound was then irrigated.  The wound was then explored for  any undue bleeding and there was no bleeding noted. The wound was then closed in layers using 2-0 Vicryl, followed by 4-0 Monocryl.  Benzoin and Steri-Strips were applied, followed by sterile dressing.  All instrument counts were correct at the termination of the procedure.   Of note, Pricilla Holm, PA-C, was my assistant throughout surgery, and did aid in retraction, placement of  the hardware, suctioning, and closure from start to finish.     Phylliss Bob, MD

## 2019-10-02 NOTE — Anesthesia Procedure Notes (Addendum)
Procedure Name: Intubation Date/Time: 10/02/2019 10:54 AM Performed by: Trinna Post., CRNA Pre-anesthesia Checklist: Patient identified, Emergency Drugs available, Suction available, Patient being monitored and Timeout performed Patient Re-evaluated:Patient Re-evaluated prior to induction Oxygen Delivery Method: Circle system utilized Preoxygenation: Pre-oxygenation with 100% oxygen Induction Type: IV induction Ventilation: Mask ventilation without difficulty Laryngoscope Size: Glidescope and 4 Grade View: Grade I Tube type: Oral Tube size: 7.5 mm Number of attempts: 1 Airway Equipment and Method: Rigid stylet and Video-laryngoscopy Placement Confirmation: ETT inserted through vocal cords under direct vision,  positive ETCO2 and breath sounds checked- equal and bilateral Secured at: 23 cm Tube secured with: Tape Dental Injury: Teeth and Oropharynx as per pre-operative assessment  Comments: Elective glidescope use due to limited neck mobility

## 2019-10-02 NOTE — Anesthesia Postprocedure Evaluation (Signed)
Anesthesia Post Note  Patient: FULGENCIO KRENGEL  Procedure(s) Performed: ANTERIOR CERVICAL DECOMPRESSION FUSION CERVICAL THREE-FOUR WITH INSTRUMENTATION AND ALLOGRAFT (N/A Neck)     Patient location during evaluation: PACU Anesthesia Type: General Level of consciousness: awake and alert, oriented and patient cooperative Pain management: pain level controlled Vital Signs Assessment: post-procedure vital signs reviewed and stable Respiratory status: spontaneous breathing, nonlabored ventilation and respiratory function stable Cardiovascular status: blood pressure returned to baseline and stable Postop Assessment: no apparent nausea or vomiting Anesthetic complications: no    Last Vitals:  Vitals:   10/02/19 1345 10/02/19 1400  BP: 130/82 128/80  Pulse: 73 70  Resp: 16 13  Temp:    SpO2: 96% 98%    Last Pain:  Vitals:   10/02/19 1430  TempSrc:   PainSc: 5       LLE Sensation: Full sensation (10/02/19 1430)   RLE Sensation: Full sensation (10/02/19 1430)      Pervis Hocking

## 2019-10-02 NOTE — Transfer of Care (Signed)
Immediate Anesthesia Transfer of Care Note  Patient: Benjamin Powell  Procedure(s) Performed: ANTERIOR CERVICAL DECOMPRESSION FUSION CERVICAL THREE-FOUR WITH INSTRUMENTATION AND ALLOGRAFT (N/A Neck)  Patient Location: PACU  Anesthesia Type:General  Level of Consciousness: awake, alert  and oriented  Airway & Oxygen Therapy: Patient Spontanous Breathing and Patient connected to nasal cannula oxygen  Post-op Assessment: Report given to RN and Post -op Vital signs reviewed and stable  Post vital signs: Reviewed and stable  Last Vitals:  Vitals Value Taken Time  BP 113/71 10/02/19 1328  Temp    Pulse 70 10/02/19 1336  Resp 19 10/02/19 1336  SpO2 98 % 10/02/19 1336  Vitals shown include unvalidated device data.  Last Pain:  Vitals:   10/02/19 1315  TempSrc:   PainSc: 5       Patients Stated Pain Goal: 5 ([id number])  Complications: No apparent anesthesia complications

## 2019-10-03 DIAGNOSIS — G959 Disease of spinal cord, unspecified: Secondary | ICD-10-CM | POA: Diagnosis not present

## 2019-10-03 DIAGNOSIS — Z8673 Personal history of transient ischemic attack (TIA), and cerebral infarction without residual deficits: Secondary | ICD-10-CM | POA: Diagnosis not present

## 2019-10-03 DIAGNOSIS — E119 Type 2 diabetes mellitus without complications: Secondary | ICD-10-CM | POA: Diagnosis not present

## 2019-10-03 DIAGNOSIS — G4730 Sleep apnea, unspecified: Secondary | ICD-10-CM | POA: Diagnosis not present

## 2019-10-03 DIAGNOSIS — G9520 Unspecified cord compression: Secondary | ICD-10-CM | POA: Diagnosis not present

## 2019-10-03 DIAGNOSIS — N400 Enlarged prostate without lower urinary tract symptoms: Secondary | ICD-10-CM | POA: Diagnosis not present

## 2019-10-03 DIAGNOSIS — K219 Gastro-esophageal reflux disease without esophagitis: Secondary | ICD-10-CM | POA: Diagnosis not present

## 2019-10-03 DIAGNOSIS — I10 Essential (primary) hypertension: Secondary | ICD-10-CM | POA: Diagnosis not present

## 2019-10-03 DIAGNOSIS — G9589 Other specified diseases of spinal cord: Secondary | ICD-10-CM | POA: Diagnosis not present

## 2019-10-03 DIAGNOSIS — M4802 Spinal stenosis, cervical region: Secondary | ICD-10-CM | POA: Diagnosis not present

## 2019-10-03 LAB — GLUCOSE, CAPILLARY: Glucose-Capillary: 134 mg/dL — ABNORMAL HIGH (ref 70–99)

## 2019-10-03 MED ORDER — OXYCODONE-ACETAMINOPHEN 5-325 MG PO TABS: 1.0000 | ORAL_TABLET | ORAL | 0 refills | Status: DC | PRN

## 2019-10-03 MED ORDER — METHOCARBAMOL 500 MG PO TABS: 500.0000 mg | ORAL_TABLET | Freq: Four times a day (QID) | ORAL | 2 refills | Status: DC | PRN

## 2019-10-03 NOTE — Progress Notes (Signed)
Patient is discharged from room 3C02 at this time. Alert and in stable condition. IV site d/c'd and instructions read to patient with understanding verbalized and all questions answered. Left unit via wheelchair with all belongings at side.  

## 2019-10-03 NOTE — Progress Notes (Signed)
    Patient doing well  Denies arm pain Tolerating PO well Patient does report improvement in his balance   Physical Exam: Vitals:   10/02/19 2333 10/03/19 0311  BP: 124/78 127/81  Pulse: 67 66  Resp: 18 20  Temp: 98.5 F (36.9 C) 98.5 F (36.9 C)  SpO2: 96% 99%    Neck soft/supple Dressing in place Patient's strength examination is unchanged, with diffuse nonfocal weakness throughout the bilateral lower extremities  POD #1 s/p ACDF, doing well, with subjective reports of increased myelopathic symptoms  - encourage ambulation - Percocet for pain, Robaxin for muscle spasms - d/c home today with f/u in 2 weeks

## 2019-10-03 NOTE — Progress Notes (Signed)
Inpatient Diabetes Program Recommendations  AACE/ADA: New Consensus Statement on Inpatient Glycemic Control (2015)  Target Ranges:  Prepandial:   less than 140 mg/dL      Peak postprandial:   less than 180 mg/dL (1-2 hours)      Critically ill patients:  140 - 180 mg/dL   Lab Results  Component Value Date   GLUCAP 134 (H) 10/03/2019   HGBA1C 10.2 (H) 09/27/2019    Review of Glycemic Control Results for Benjamin Powell, Benjamin Powell (MRN [id number]) as of 10/03/2019 15:17  Ref. Range 10/02/2019 12:46 10/02/2019 13:14 10/02/2019 16:38 10/02/2019 21:30 10/03/2019 06:25  Glucose-Capillary Latest Ref Range: 70 - 99 mg/dL 182 (H) 196 (H) 203 (H) 196 (H) 134 (H)   Diabetes history: DM2 Outpatient Diabetes medications: Lantus 25-30 units q hs if CBG >230 + Ozempic 1 mg q week Current orders for Inpatient glycemic control: Lantus 10 units daily + Novolog sensitive correction tid + hs 0-5 units  Inpatient Diabetes Program Recommendations:   Spoke with pt about A1C 10.1 (average blood glucose > 240 over the past 2-3 months) results with them and explained what an A1C is, basic pathophysiology of DM Type 2, basic home care, basic diabetes diet nutrition principles, importance of checking CBGs and maintaining good CBG control to prevent long-term and short-term complications. Reviewed signs and symptoms of hyperglycemia and hypoglycemia and how to treat hypoglycemia at home. Also reviewed blood sugar goals at home.  Ordered Living Well With Diabetes. Patient came off of his Lantus due to CBGs hypo to 50 @ times. Reviewed with patient to contact physician to adjust Lantus next time instead of discontinuing dose. Patient verbalized understanding.  Thank you, Nani Gasser. Hanks, RN, MSN, CDE  Diabetes Coordinator Inpatient Glycemic Control Team Team Pager [phone] (8am-5pm) 10/03/2019 3:25 PM

## 2019-10-04 ENCOUNTER — Encounter: Payer: Self-pay | Admitting: *Deleted

## 2019-10-04 DIAGNOSIS — E108 Type 1 diabetes mellitus with unspecified complications: Secondary | ICD-10-CM | POA: Diagnosis not present

## 2019-10-04 DIAGNOSIS — Z794 Long term (current) use of insulin: Secondary | ICD-10-CM | POA: Diagnosis not present

## 2019-10-10 NOTE — Discharge Summary (Signed)
Patient ID: Benjamin Powell MRN: [id number] DOB/AGE: [birth year] 74 y.o.  Admit date: 10/02/2019 Discharge date: 10/03/2019  Admission Diagnoses:  Active Problems:   Myelopathy [facility])   Discharge Diagnoses:  Same  Past Medical History:  Diagnosis Date  . Allergy    seasonal  . Anxiety   . BPH (benign prostatic hyperplasia)   . Cataract   . Diabetes mellitus   . GERD (gastroesophageal reflux disease)   . Hypertension   . Sleep apnea    On CPAP  . Stroke Benjamin Powell)     Surgeries: Procedure(s): ANTERIOR CERVICAL DECOMPRESSION FUSION CERVICAL THREE-FOUR WITH INSTRUMENTATION AND ALLOGRAFT on 10/02/2019   Consultants: None  Discharged Condition: Improved  Hospital Course: Benjamin Powell is an 74 y.o. male who was admitted 10/02/2019 for operative treatment of myelopathy. Patient has severe unremitting pain that affects sleep, daily activities, and work/hobbies. After pre-op clearance the patient was taken to the operating room on 10/02/2019 and underwent  Procedure(s): ANTERIOR CERVICAL DECOMPRESSION FUSION CERVICAL THREE-FOUR WITH INSTRUMENTATION AND ALLOGRAFT.    Patient was given perioperative antibiotics:  Anti-infectives (From admission, onward)   Start     Dose/Rate Route Frequency Ordered Stop   10/02/19 1900  ceFAZolin (ANCEF) IVPB 2g/100 mL premix     2 g 200 mL/hr over 30 Minutes Intravenous Every 8 hours 10/02/19 1513 10/03/19 1020   10/02/19 0848  ceFAZolin (ANCEF) 2-4 GM/100ML-% IVPB    Note to Pharmacy: Alvy Beal   : cabinet override      10/02/19 0848 10/02/19 2059       Patient was given sequential compression devices, early ambulation to prevent DVT.  Patient benefited maximally from hospital stay and there were no complications.    Recent vital signs: BP 127/79 (BP Location: Right Arm)   Pulse 70   Temp 97.7 F (36.5 C) (Oral)   Resp 18   Ht 5\' 6"  (1.676 m)   Wt 93.1 kg   SpO2 100%   BMI 33.14 kg/m    Discharge Medications:     Allergies as of 10/03/2019   No Known Allergies     Medication List    TAKE these medications   atorvastatin 40 MG tablet Commonly known as: LIPITOR Take 1 tablet (40 mg total) by mouth daily. What changed: when to take this   Chantix Starting Month Pak 0.5 MG X 11 & 1 MG X 42 tablet Generic drug: varenicline Take 0.5 mg by mouth in the morning and at bedtime.   Cholecalciferol 25 MCG (1000 UT) tablet Commonly known as: Vitamin D-1000 Max St Take 1 tablet (1,000 Units total) by mouth daily.   cholestyramine 4 g packet Commonly known as: Questran Take 1 packet (4 g total) by mouth 2 (two) times daily.   clobetasol cream 0.05 % Commonly known as: TEMOVATE Apply 1 application topically 2 (two) times daily as needed (irritation).   docusate sodium 100 MG capsule Commonly known as: Colace Take 1 capsule (100 mg total) by mouth 3 (three) times daily as needed.   dutasteride 0.5 MG capsule Commonly known as: AVODART TAKE ONE CAPSULE BY MOUTH DAILY (NEED APPOINTMENT)   hydrochlorothiazide 25 MG tablet Commonly known as: HYDRODIURIL Take 25 mg by mouth daily.   insulin glargine 100 unit/mL Sopn Commonly known as: LANTUS Inject 25-30 Units into the skin at bedtime as needed (blood sugar of 230 or above).   methocarbamol 500 MG tablet Commonly known as: ROBAXIN Take 1 tablet (500 mg total) by mouth  every 6 (six) hours as needed for muscle spasms.   metoprolol tartrate 50 MG tablet Commonly known as: LOPRESSOR Take 50 mg by mouth daily.   mupirocin ointment 2 % Commonly known as: BACTROBAN Apply to wound 3 times daily for 5 days   oxyCODONE-acetaminophen 5-325 MG tablet Commonly known as: PERCOCET/ROXICET Take 1-2 tablets by mouth every 4 (four) hours as needed for moderate pain or severe pain.   OZEMPIC (1 MG/DOSE) [place] Inject 1 mg into the skin every 7 (seven) days.   pantoprazole 40 MG tablet Commonly known as: PROTONIX Take 1 tablet (40 mg total) by mouth  daily.   spironolactone 25 MG tablet Commonly known as: ALDACTONE Take 25 mg by mouth daily.   tamsulosin 0.4 MG Caps capsule Commonly known as: FLOMAX TAKE ONE CAPSULE BY MOUTH DAILY   triamcinolone 0.1 % paste Commonly known as: KENALOG Apply to cracked lips up to twice a day as needed.       Diagnostic Studies: DG Cervical Spine 1 View  Result Date: 10/02/2019 CLINICAL DATA:  C3-4 fusion EXAM: DG C-ARM 1-60 MIN; DG CERVICAL SPINE - 1 VIEW COMPARISON:  12/19/2011 FLUOROSCOPY TIME:  Fluoroscopy Time:  16 seconds Radiation Exposure Index (if provided by the fluoroscopic device): 2.46 mGy Number of Acquired Spot Images: 1 FINDINGS: Lateral spot film of the cervical spine reveals interbody fusion at C3-4 with anterior fixation. IMPRESSION: C3-4 fusion. Electronically Signed   By: Inez Catalina M.D.   On: 10/02/2019 16:16   DG C-Arm 1-60 Min  Result Date: 10/02/2019 CLINICAL DATA:  C3-4 fusion EXAM: DG C-ARM 1-60 MIN; DG CERVICAL SPINE - 1 VIEW COMPARISON:  12/19/2011 FLUOROSCOPY TIME:  Fluoroscopy Time:  16 seconds Radiation Exposure Index (if provided by the fluoroscopic device): 2.46 mGy Number of Acquired Spot Images: 1 FINDINGS: Lateral spot film of the cervical spine reveals interbody fusion at C3-4 with anterior fixation. IMPRESSION: C3-4 fusion. Electronically Signed   By: Inez Catalina M.D.   On: 10/02/2019 16:16    Disposition: Discharge disposition: 01-Home or Self Care        POD #1 s/p ACDF, doing well, with subjective reports of decreased myelopathic symptoms  - encourage ambulation - Percocet for pain, Robaxin for muscle spasms -Scripts for pain sent to pharmacy electronically  -D/C instructions sheet printed and in chart -D/C today  -F/U in office 2 weeks   Signed: Lennie Muckle Johnny Gorter 10/10/2019, 11:23 AM

## 2019-10-18 DIAGNOSIS — M4802 Spinal stenosis, cervical region: Secondary | ICD-10-CM | POA: Diagnosis not present

## 2019-10-18 DIAGNOSIS — Z9889 Other specified postprocedural states: Secondary | ICD-10-CM | POA: Diagnosis not present

## 2019-10-18 DIAGNOSIS — G9529 Other cord compression: Secondary | ICD-10-CM | POA: Diagnosis not present

## 2019-10-20 ENCOUNTER — Other Ambulatory Visit: Payer: Self-pay | Admitting: Physician Assistant

## 2019-10-20 DIAGNOSIS — N400 Enlarged prostate without lower urinary tract symptoms: Secondary | ICD-10-CM

## 2019-10-21 ENCOUNTER — Other Ambulatory Visit: Payer: Self-pay

## 2019-10-21 DIAGNOSIS — N400 Enlarged prostate without lower urinary tract symptoms: Secondary | ICD-10-CM

## 2019-10-21 MED ORDER — TAMSULOSIN HCL 0.4 MG PO CAPS: 0.4000 mg | ORAL_CAPSULE | Freq: Every day | ORAL | 2 refills | Status: DC

## 2019-10-28 ENCOUNTER — Other Ambulatory Visit: Payer: Self-pay | Admitting: Sports Medicine

## 2019-10-28 DIAGNOSIS — M170 Bilateral primary osteoarthritis of knee: Secondary | ICD-10-CM

## 2019-11-04 DIAGNOSIS — E108 Type 1 diabetes mellitus with unspecified complications: Secondary | ICD-10-CM | POA: Diagnosis not present

## 2019-11-04 DIAGNOSIS — Z794 Long term (current) use of insulin: Secondary | ICD-10-CM | POA: Diagnosis not present

## 2019-11-13 ENCOUNTER — Other Ambulatory Visit: Payer: Self-pay | Admitting: *Deleted

## 2019-11-13 DIAGNOSIS — M170 Bilateral primary osteoarthritis of knee: Secondary | ICD-10-CM

## 2019-11-13 MED ORDER — TRAMADOL HCL 50 MG PO TABS: 50.0000 mg | ORAL_TABLET | Freq: Two times a day (BID) | ORAL | 0 refills | Status: DC

## 2019-11-15 DIAGNOSIS — G9529 Other cord compression: Secondary | ICD-10-CM | POA: Diagnosis not present

## 2019-11-15 DIAGNOSIS — M545 Low back pain: Secondary | ICD-10-CM | POA: Diagnosis not present

## 2019-11-15 DIAGNOSIS — M4802 Spinal stenosis, cervical region: Secondary | ICD-10-CM | POA: Diagnosis not present

## 2019-11-15 DIAGNOSIS — M4807 Spinal stenosis, lumbosacral region: Secondary | ICD-10-CM | POA: Diagnosis not present

## 2019-11-22 DIAGNOSIS — Z794 Long term (current) use of insulin: Secondary | ICD-10-CM | POA: Diagnosis not present

## 2019-11-22 DIAGNOSIS — E1165 Type 2 diabetes mellitus with hyperglycemia: Secondary | ICD-10-CM | POA: Diagnosis not present

## 2019-11-22 DIAGNOSIS — R911 Solitary pulmonary nodule: Secondary | ICD-10-CM | POA: Diagnosis not present

## 2019-11-22 DIAGNOSIS — R634 Abnormal weight loss: Secondary | ICD-10-CM | POA: Diagnosis not present

## 2019-11-22 DIAGNOSIS — E1159 Type 2 diabetes mellitus with other circulatory complications: Secondary | ICD-10-CM | POA: Diagnosis not present

## 2019-11-22 DIAGNOSIS — Z6827 Body mass index (BMI) 27.0-27.9, adult: Secondary | ICD-10-CM | POA: Diagnosis not present

## 2019-11-22 DIAGNOSIS — E2609 Other primary hyperaldosteronism: Secondary | ICD-10-CM | POA: Diagnosis not present

## 2019-11-22 DIAGNOSIS — R0609 Other forms of dyspnea: Secondary | ICD-10-CM | POA: Diagnosis not present

## 2019-11-22 DIAGNOSIS — Z79899 Other long term (current) drug therapy: Secondary | ICD-10-CM | POA: Diagnosis not present

## 2019-12-04 DIAGNOSIS — E108 Type 1 diabetes mellitus with unspecified complications: Secondary | ICD-10-CM | POA: Diagnosis not present

## 2019-12-04 DIAGNOSIS — Z794 Long term (current) use of insulin: Secondary | ICD-10-CM | POA: Diagnosis not present

## 2019-12-13 DIAGNOSIS — E1165 Type 2 diabetes mellitus with hyperglycemia: Secondary | ICD-10-CM | POA: Diagnosis not present

## 2019-12-13 DIAGNOSIS — I639 Cerebral infarction, unspecified: Secondary | ICD-10-CM | POA: Diagnosis not present

## 2019-12-13 DIAGNOSIS — N401 Enlarged prostate with lower urinary tract symptoms: Secondary | ICD-10-CM | POA: Diagnosis not present

## 2019-12-13 DIAGNOSIS — Z125 Encounter for screening for malignant neoplasm of prostate: Secondary | ICD-10-CM | POA: Diagnosis not present

## 2019-12-13 DIAGNOSIS — K921 Melena: Secondary | ICD-10-CM | POA: Diagnosis not present

## 2019-12-13 DIAGNOSIS — R5382 Chronic fatigue, unspecified: Secondary | ICD-10-CM | POA: Diagnosis not present

## 2019-12-13 DIAGNOSIS — E1159 Type 2 diabetes mellitus with other circulatory complications: Secondary | ICD-10-CM | POA: Diagnosis not present

## 2019-12-13 DIAGNOSIS — K529 Noninfective gastroenteritis and colitis, unspecified: Secondary | ICD-10-CM | POA: Diagnosis not present

## 2019-12-13 DIAGNOSIS — R634 Abnormal weight loss: Secondary | ICD-10-CM | POA: Diagnosis not present

## 2019-12-13 DIAGNOSIS — E2609 Other primary hyperaldosteronism: Secondary | ICD-10-CM | POA: Diagnosis not present

## 2019-12-20 DIAGNOSIS — Z9889 Other specified postprocedural states: Secondary | ICD-10-CM | POA: Diagnosis not present

## 2019-12-20 DIAGNOSIS — Z961 Presence of intraocular lens: Secondary | ICD-10-CM | POA: Diagnosis not present

## 2019-12-20 DIAGNOSIS — Z794 Long term (current) use of insulin: Secondary | ICD-10-CM | POA: Diagnosis not present

## 2019-12-20 DIAGNOSIS — H2512 Age-related nuclear cataract, left eye: Secondary | ICD-10-CM | POA: Diagnosis not present

## 2019-12-20 DIAGNOSIS — E119 Type 2 diabetes mellitus without complications: Secondary | ICD-10-CM | POA: Diagnosis not present

## 2019-12-20 DIAGNOSIS — Z122 Encounter for screening for malignant neoplasm of respiratory organs: Secondary | ICD-10-CM | POA: Diagnosis not present

## 2019-12-20 DIAGNOSIS — Z85118 Personal history of other malignant neoplasm of bronchus and lung: Secondary | ICD-10-CM | POA: Diagnosis not present

## 2019-12-20 LAB — HM DIABETES EYE EXAM

## 2019-12-24 ENCOUNTER — Other Ambulatory Visit: Payer: Self-pay | Admitting: *Deleted

## 2019-12-24 DIAGNOSIS — M170 Bilateral primary osteoarthritis of knee: Secondary | ICD-10-CM

## 2019-12-24 MED ORDER — TRAMADOL HCL 50 MG PO TABS: 50.0000 mg | ORAL_TABLET | Freq: Two times a day (BID) | ORAL | 0 refills | Status: DC

## 2020-01-10 DIAGNOSIS — R69 Illness, unspecified: Secondary | ICD-10-CM | POA: Diagnosis not present

## 2020-01-10 DIAGNOSIS — Z7982 Long term (current) use of aspirin: Secondary | ICD-10-CM | POA: Diagnosis not present

## 2020-01-10 DIAGNOSIS — I951 Orthostatic hypotension: Secondary | ICD-10-CM | POA: Diagnosis not present

## 2020-01-10 DIAGNOSIS — R634 Abnormal weight loss: Secondary | ICD-10-CM | POA: Diagnosis not present

## 2020-01-10 DIAGNOSIS — Z794 Long term (current) use of insulin: Secondary | ICD-10-CM | POA: Diagnosis not present

## 2020-01-10 DIAGNOSIS — E1165 Type 2 diabetes mellitus with hyperglycemia: Secondary | ICD-10-CM | POA: Diagnosis not present

## 2020-01-10 DIAGNOSIS — Z9114 Patient's other noncompliance with medication regimen: Secondary | ICD-10-CM | POA: Diagnosis not present

## 2020-01-10 DIAGNOSIS — R42 Dizziness and giddiness: Secondary | ICD-10-CM | POA: Diagnosis not present

## 2020-01-23 ENCOUNTER — Other Ambulatory Visit: Payer: Self-pay

## 2020-01-24 ENCOUNTER — Ambulatory Visit: Payer: Medicare HMO | Admitting: Diagnostic Neuroimaging

## 2020-01-24 ENCOUNTER — Encounter: Payer: Self-pay | Admitting: Diagnostic Neuroimaging

## 2020-01-31 DIAGNOSIS — E1165 Type 2 diabetes mellitus with hyperglycemia: Secondary | ICD-10-CM | POA: Diagnosis not present

## 2020-02-04 ENCOUNTER — Other Ambulatory Visit: Payer: Self-pay | Admitting: Medical-Surgical

## 2020-02-04 DIAGNOSIS — K219 Gastro-esophageal reflux disease without esophagitis: Secondary | ICD-10-CM

## 2020-02-06 DIAGNOSIS — E108 Type 1 diabetes mellitus with unspecified complications: Secondary | ICD-10-CM | POA: Diagnosis not present

## 2020-02-06 DIAGNOSIS — Z794 Long term (current) use of insulin: Secondary | ICD-10-CM | POA: Diagnosis not present

## 2020-02-07 ENCOUNTER — Other Ambulatory Visit: Payer: Self-pay

## 2020-02-07 ENCOUNTER — Emergency Department (INDEPENDENT_AMBULATORY_CARE_PROVIDER_SITE_OTHER)
Admission: EM | Admit: 2020-02-07 | Discharge: 2020-02-07 | Disposition: A | Discharge status: Home / Self Care | Payer: Medicare HMO | Source: Home / Self Care

## 2020-02-07 ENCOUNTER — Ambulatory Visit: Payer: Medicare HMO | Admitting: Sports Medicine

## 2020-02-07 DIAGNOSIS — E162 Hypoglycemia, unspecified: Secondary | ICD-10-CM | POA: Diagnosis not present

## 2020-02-07 DIAGNOSIS — R739 Hyperglycemia, unspecified: Secondary | ICD-10-CM

## 2020-02-07 LAB — POCT URINALYSIS DIP (MANUAL ENTRY)
Bilirubin, UA: NEGATIVE
Blood, UA: NEGATIVE
Glucose, UA: 250 mg/dL — AB
Leukocytes, UA: NEGATIVE
Nitrite, UA: NEGATIVE
Protein Ur, POC: 30 mg/dL — AB
Spec Grav, UA: 1.025 (ref 1.010–1.025)
Urobilinogen, UA: 0.2 E.U./dL
pH, UA: 5 (ref 5.0–8.0)

## 2020-02-07 LAB — POCT FASTING CBG KUC MANUAL ENTRY: POCT Glucose (KUC): 271 mg/dL — AB (ref 70–99)

## 2020-02-07 NOTE — Discharge Instructions (Addendum)
Hydrate well with water.   Eat a small meal prior to giving yourself large doses of insulin per the sliding scale provided to you by your doctor.  Keep follow-up with visit with your primary care.  Stop the soda's your body cannot get rid of sugar easily therefore your sugar can  remain high for drinking or eating anything with sugar.  If blood sugar reading read HIGH consistently greater 500, go to the Emergency room.

## 2020-02-07 NOTE — ED Provider Notes (Signed)
[name]    CSN: [id number] Arrival date & time: 02/07/20  1418      History   Chief Complaint Chief Complaint  Patient presents with  . Hyperglycemia    HPI JADYN BARGE is a 73 y.o. male.   HPI  Patient presents for evaluation of elevated and low blood sugars. He is prescribed large doses of insulin and endorses recent changes to diabetic regimen. Previously prescribed Ozempic however, insurance stopped paying for medication. Current regimen includes Lantus and Novolog. Reports administering 40 units of insulin this morning and eat a very light breakfast with blood sugar dropping to 60. Blood sugars upon awaking average 260 -300. Endorses high intake of sugar, drinks regular no diet soda and eats a regular diet. Blood sugar on recheck today, 271.  Past Medical History:  Diagnosis Date  . Allergy    seasonal  . Anxiety   . BPH (benign prostatic hyperplasia)   . Cataract   . Diabetes mellitus   . GERD (gastroesophageal reflux disease)   . Hypertension   . Sleep apnea    On CPAP  . Stroke [facility])     Patient Active Problem List   Diagnosis Date Noted  . Myelopathy ([place]) 10/02/2019  . Acute diarrhea 02/05/2019  . Chronic diarrhea 02/05/2019  . Chronic fatigue 10/06/2017  . Tobacco abuse 10/06/2017  . Hand sprain, left, initial encounter 09/22/2017  . Hand injuries, left, initial encounter 09/11/2017  . Closed head injury 09/11/2017  . Fall in elderly patient 09/11/2017  . History of stroke 02/03/2017  . Stroke (cerebrum) ([place]) 10/30/2016  . GERD (gastroesophageal reflux disease) 04/15/2016  . Depression 06/19/2015  . Hypogonadism in male 04/21/2014  . Degenerative disc disease, cervical 01/31/2014  . Primary osteoarthritis of both knees 01/31/2014  . Obstructive sleep apnea 08/05/2013  . Posterior vitreous detachment, left eye 01/18/2013  . Microalbuminuric diabetic nephropathy ([place]) 01/03/2013  . Spondylosis of cervical region without  myelopathy or radiculopathy 12/19/2011  . Lumbar degenerative disc disease 12/19/2011  . Hypertension 12/01/2011  . Type 2 diabetes mellitus ([place]) 12/01/2011    Past Surgical History:  Procedure Laterality Date  . [place]   left  . ANTERIOR CERVICAL DECOMP/DISCECTOMY FUSION N/A 10/02/2019   Procedure: ANTERIOR CERVICAL DECOMPRESSION FUSION CERVICAL THREE-FOUR WITH INSTRUMENTATION AND ALLOGRAFT;  Surgeon: Phylliss Bob, MD;  Location: [place];  Service: Orthopedics;  Laterality: N/A;  . CATARACT EXTRACTION Right   . CHOLECYSTECTOMY  1990  . ERCP  1990  . Hammer Toe Repair Right 09/16/2016   RT #5  . Partial Excision Phalanx Left 09/16/2016   LT#5  . TONSILLECTOMY AND ADENOIDECTOMY  1998   and sinus for sleep apnea       Home Medications    Prior to Admission medications   Medication Sig Start Date End Date Taking? Authorizing Provider  atorvastatin (LIPITOR) 40 MG tablet Take 1 tablet (40 mg total) by mouth daily. Patient taking differently: Take 40 mg by mouth at bedtime.  10/06/17   Emeterio Reeve, DO  CHANTIX STARTING MONTH PAK 0.5 MG X 11 & 1 MG X 42 tablet Take 0.5 mg by mouth in the morning and at bedtime. 08/23/19   [provider]  Cholecalciferol (VITAMIN D-1000 MAX ST) 1000 units tablet Take 1 tablet (1,000 Units total) by mouth daily. 10/06/17   Emeterio Reeve, DO  clobetasol cream (TEMOVATE) 0.10 % Apply 1 application topically 2 (two) times daily as needed (irritation).    [provider]  hydrochlorothiazide (HYDRODIURIL) 25 MG tablet Take 25 mg by mouth daily.    [provider]  insulin glargine (LANTUS) 100 unit/mL SOPN Inject 25-30 Units into the skin at bedtime as needed (blood sugar of 230 or above).     [provider]  methocarbamol (ROBAXIN) 500 MG tablet Take 1 tablet (500 mg total) by mouth every 6 (six) hours as needed for muscle spasms. 10/03/19   Phylliss Bob, MD  metoprolol tartrate (LOPRESSOR) 50 MG  tablet Take 50 mg by mouth daily.  01/13/17   [provider]  pantoprazole (PROTONIX) 40 MG tablet TAKE ONE TABLET BY MOUTH DAILY 02/04/20   Emeterio Reeve, DO  Semaglutide (OZEMPIC, 1 MG/DOSE, ) Inject 1 mg into the skin every 7 (seven) days.    [provider]  spironolactone (ALDACTONE) 25 MG tablet Take 25 mg by mouth daily.    [provider]  tamsulosin (FLOMAX) 0.4 MG CAPS capsule Take 1 capsule (0.4 mg total) by mouth daily. 10/21/19   Emeterio Reeve, DO  traMADol (ULTRAM) 50 MG tablet Take 1 tablet (50 mg total) by mouth 2 (two) times daily. 12/24/19   Silverio Decamp, MD    Family History Family History  Problem Relation Age of Onset  . Stroke Brother   . Colitis Brother   . Pancreatic cancer Mother   . Colitis Sister   . Colon cancer Neg Hx   . Esophageal cancer Neg Hx   . Rectal cancer Neg Hx   . Stomach cancer Neg Hx     Social History Social History   Tobacco Use  . Smoking status: Former Smoker    Packs/day: 0.30    Years: 50.00    Pack years: 15.00    Types: Cigarettes    Quit date: 08/08/2019    Years since quitting: 0.5  . Smokeless tobacco: Never Used  Vaping Use  . Vaping Use: Never used  Substance Use Topics  . Alcohol use: No  . Drug use: No     Allergies   Patient has no known allergies.  Review of Systems Review of Systems Pertinent negatives listed in HPI Physical Exam Triage Vital Signs ED Triage Vitals  Enc Vitals Group     BP 02/07/20 1430 116/75     Pulse Rate 02/07/20 1430 76     Resp 02/07/20 1430 17     Temp 02/07/20 1430 97.8 F (36.6 C)     Temp Source 02/07/20 1430 Oral     SpO2 02/07/20 1430 99 %     Weight --      Height --      Head Circumference --      Peak Flow --      Pain Score 02/07/20 1433 0     Pain Loc --      Pain Edu? --      Excl. in [place]? --    No data found.  Updated Vital Signs BP 116/75 (BP Location: Right Arm)   Pulse 76   Temp 97.8 F (36.6 C) (Oral)    Resp 17   SpO2 99%   Visual Acuity Right Eye Distance:   Left Eye Distance:   Bilateral Distance:    Right Eye Near:   Left Eye Near:    Bilateral Near:     Physical Exam General appearance: alert, well developed, well nourished, cooperative  Head: Normocephalic, without obvious abnormality, atraumatic Respiratory: Respirations even and unlabored, normal respiratory rate Heart: Rate and  rhythm normal. No gallop or murmurs   Abdomen: BS +, no distention, no rebound tenderness, or no mass Skin: Skin color, texture, turgor normal. No rashes seen  Psych: Appropriate mood and affect. Neurologic:GCS 15, symmetrical movements, normal gait  UC Treatments / Results  Labs (all labs ordered are listed, but only abnormal results are displayed) Labs Reviewed  POCT FASTING CBG [facility] MANUAL ENTRY - Abnormal; Notable for the following components:      Result Value   POCT Glucose ([facility]) 271 (*)    All other components within normal limits  POCT URINALYSIS DIP (MANUAL ENTRY) - Abnormal; Notable for the following components:   Glucose, UA =250 (*)    Ketones, POC UA trace (5) (*)    Protein Ur, POC =30 (*)    All other components within normal limits    EKG   Radiology No results found.  Procedures Procedures (including critical care time)  Medications Ordered in UC Medications - No data to display  Initial Impression / Assessment and Plan / UC Course  I have reviewed the triage vital signs and the nursing notes.  Pertinent labs & imaging results that were available during my care of the patient were reviewed by me and considered in my medical decision making (see chart for details).    Hyperglycemia on arrival here today.Given complexity of diabetes deferred adjusting medication. Recommended eating a more protein content meal prior to administering such high doses of insulin. Advised to follow-up with endocrinologist. Red flags discussed. Patient verbalized understanding and  agreement of plan. Final Clinical Impressions(s) / UC Diagnoses   Final diagnoses:  Hypoglycemia  Hyperglycemia     Discharge Instructions     Hydrate well with water.   Eat a small meal prior to giving yourself large doses of insulin per the sliding scale provided to you by your doctor.  Keep follow-up with visit with your primary care.  Stop the soda's your body cannot get rid of sugar easily therefore your sugar can  remain high for drinking or eating anything with sugar.  If blood sugar reading read HIGH consistently greater 500, go to the Emergency room.      ED Prescriptions    None     PDMP not reviewed this encounter.   Scot Jun, FNP 02/10/20 1930

## 2020-02-07 NOTE — ED Triage Notes (Signed)
Pt c/o issues with his blood sugar. At night over 500. Says BS dropped to 60 about 45 mins ago. Last check was 133. Is not aware of any sensor issues. But keeps getting sensor error in ofc. Having no physical sxs of sugar isues.

## 2020-02-14 ENCOUNTER — Ambulatory Visit: Payer: Medicare HMO | Admitting: Sports Medicine

## 2020-02-17 DIAGNOSIS — H2512 Age-related nuclear cataract, left eye: Secondary | ICD-10-CM | POA: Diagnosis not present

## 2020-02-20 DIAGNOSIS — H2512 Age-related nuclear cataract, left eye: Secondary | ICD-10-CM | POA: Diagnosis not present

## 2020-02-21 DIAGNOSIS — Z794 Long term (current) use of insulin: Secondary | ICD-10-CM | POA: Diagnosis not present

## 2020-02-21 DIAGNOSIS — Z8673 Personal history of transient ischemic attack (TIA), and cerebral infarction without residual deficits: Secondary | ICD-10-CM | POA: Diagnosis not present

## 2020-02-21 DIAGNOSIS — Z79899 Other long term (current) drug therapy: Secondary | ICD-10-CM | POA: Diagnosis not present

## 2020-02-21 DIAGNOSIS — E1165 Type 2 diabetes mellitus with hyperglycemia: Secondary | ICD-10-CM | POA: Diagnosis not present

## 2020-02-21 DIAGNOSIS — E2609 Other primary hyperaldosteronism: Secondary | ICD-10-CM | POA: Diagnosis not present

## 2020-03-06 ENCOUNTER — Other Ambulatory Visit: Payer: Self-pay

## 2020-03-06 ENCOUNTER — Ambulatory Visit (INDEPENDENT_AMBULATORY_CARE_PROVIDER_SITE_OTHER): Payer: Medicare HMO | Admitting: Sports Medicine

## 2020-03-06 DIAGNOSIS — M170 Bilateral primary osteoarthritis of knee: Secondary | ICD-10-CM

## 2020-03-06 DIAGNOSIS — Z98818 Other dental procedure status: Secondary | ICD-10-CM

## 2020-03-06 DIAGNOSIS — M5136 Other intervertebral disc degeneration, lumbar region: Secondary | ICD-10-CM | POA: Diagnosis not present

## 2020-03-06 DIAGNOSIS — M51369 Other intervertebral disc degeneration, lumbar region without mention of lumbar back pain or lower extremity pain: Secondary | ICD-10-CM

## 2020-03-06 MED ORDER — AMOXICILLIN 500 MG PO TABS: ORAL_TABLET | ORAL | 0 refills | Status: DC

## 2020-03-06 MED ORDER — PREDNISONE 50 MG PO TABS: ORAL_TABLET | ORAL | 0 refills | Status: DC

## 2020-03-06 MED ORDER — TRAMADOL HCL 50 MG PO TABS: 50.0000 mg | ORAL_TABLET | Freq: Two times a day (BID) | ORAL | 0 refills | Status: DC

## 2020-03-06 NOTE — Addendum Note (Signed)
Addended by: Silverio Decamp on: 03/06/2020 11:07 AM   Modules accepted: Orders

## 2020-03-06 NOTE — Progress Notes (Addendum)
    Procedures performed today:    None.  Independent interpretation of notes and tests performed by another provider:   None.  Brief History, Exam, Impression, and Recommendations:    Lumbar degenerative disc disease Benjamin Powell is a pleasant 74 year old male with chronic back pain, recently flaring, he does have known severe spinal stenosis at L4-L5 on MRI. He is having worsening pain, nothing overtly radicular, all axial without red flags, adding prednisone, tramadol, formal physical therapy, return to see me in 6 weeks, epidural if no better.  Other dental procedure status Benjamin Powell has a dental procedure coming up, he does need amoxicillin called in.    ___________________________________________ Gwen Her. Dianah Field, M.D., ABFM., CAQSM. Primary Care and [place] Instructor of [employer] [facility] [employer] [facility]

## 2020-03-06 NOTE — Assessment & Plan Note (Signed)
Benjamin Powell is a pleasant 74 year old male with chronic back pain, recently flaring, he does have known severe spinal stenosis at L4-L5 on MRI. He is having worsening pain, nothing overtly radicular, all axial without red flags, adding prednisone, tramadol, formal physical therapy, return to see me in 6 weeks, epidural if no better.

## 2020-03-06 NOTE — Assessment & Plan Note (Signed)
Benjamin Powell has a dental procedure coming up, he does need amoxicillin called in.

## 2020-03-07 DIAGNOSIS — Z794 Long term (current) use of insulin: Secondary | ICD-10-CM | POA: Diagnosis not present

## 2020-03-07 DIAGNOSIS — E108 Type 1 diabetes mellitus with unspecified complications: Secondary | ICD-10-CM | POA: Diagnosis not present

## 2020-04-07 DIAGNOSIS — Z794 Long term (current) use of insulin: Secondary | ICD-10-CM | POA: Diagnosis not present

## 2020-04-07 DIAGNOSIS — E108 Type 1 diabetes mellitus with unspecified complications: Secondary | ICD-10-CM | POA: Diagnosis not present

## 2020-04-17 ENCOUNTER — Encounter: Payer: Self-pay | Admitting: Sports Medicine

## 2020-04-17 ENCOUNTER — Ambulatory Visit: Payer: Medicare HMO | Admitting: Sports Medicine

## 2020-05-05 ENCOUNTER — Other Ambulatory Visit: Payer: Self-pay

## 2020-05-05 DIAGNOSIS — K219 Gastro-esophageal reflux disease without esophagitis: Secondary | ICD-10-CM

## 2020-05-05 MED ORDER — PANTOPRAZOLE SODIUM 40 MG PO TBEC: 40.0000 mg | DELAYED_RELEASE_TABLET | Freq: Every day | ORAL | 0 refills | Status: DC

## 2020-05-09 DIAGNOSIS — Z86718 Personal history of other venous thrombosis and embolism: Secondary | ICD-10-CM

## 2020-05-09 HISTORY — DX: Personal history of other venous thrombosis and embolism: Z86.718

## 2020-06-05 DIAGNOSIS — E1165 Type 2 diabetes mellitus with hyperglycemia: Secondary | ICD-10-CM | POA: Diagnosis not present

## 2020-06-05 DIAGNOSIS — E2609 Other primary hyperaldosteronism: Secondary | ICD-10-CM | POA: Diagnosis not present

## 2020-06-05 DIAGNOSIS — Z794 Long term (current) use of insulin: Secondary | ICD-10-CM | POA: Diagnosis not present

## 2020-07-03 DIAGNOSIS — L853 Xerosis cutis: Secondary | ICD-10-CM | POA: Diagnosis not present

## 2020-07-03 DIAGNOSIS — W898XXA Exposure to other man-made visible and ultraviolet light, initial encounter: Secondary | ICD-10-CM | POA: Diagnosis not present

## 2020-07-03 DIAGNOSIS — D229 Melanocytic nevi, unspecified: Secondary | ICD-10-CM | POA: Diagnosis not present

## 2020-07-03 DIAGNOSIS — L820 Inflamed seborrheic keratosis: Secondary | ICD-10-CM | POA: Diagnosis not present

## 2020-07-03 DIAGNOSIS — D1801 Hemangioma of skin and subcutaneous tissue: Secondary | ICD-10-CM | POA: Diagnosis not present

## 2020-07-03 DIAGNOSIS — Z1283 Encounter for screening for malignant neoplasm of skin: Secondary | ICD-10-CM | POA: Diagnosis not present

## 2020-07-03 DIAGNOSIS — L821 Other seborrheic keratosis: Secondary | ICD-10-CM | POA: Diagnosis not present

## 2020-07-03 DIAGNOSIS — Z9229 Personal history of other drug therapy: Secondary | ICD-10-CM | POA: Diagnosis not present

## 2020-07-03 DIAGNOSIS — L920 Granuloma annulare: Secondary | ICD-10-CM | POA: Diagnosis not present

## 2020-07-03 DIAGNOSIS — L578 Other skin changes due to chronic exposure to nonionizing radiation: Secondary | ICD-10-CM | POA: Diagnosis not present

## 2020-07-03 DIAGNOSIS — Z808 Family history of malignant neoplasm of other organs or systems: Secondary | ICD-10-CM | POA: Diagnosis not present

## 2020-07-08 DIAGNOSIS — E1165 Type 2 diabetes mellitus with hyperglycemia: Secondary | ICD-10-CM | POA: Diagnosis not present

## 2020-07-10 DIAGNOSIS — Z794 Long term (current) use of insulin: Secondary | ICD-10-CM | POA: Diagnosis not present

## 2020-07-10 DIAGNOSIS — R69 Illness, unspecified: Secondary | ICD-10-CM | POA: Diagnosis not present

## 2020-07-10 DIAGNOSIS — I152 Hypertension secondary to endocrine disorders: Secondary | ICD-10-CM | POA: Diagnosis not present

## 2020-07-10 DIAGNOSIS — E1159 Type 2 diabetes mellitus with other circulatory complications: Secondary | ICD-10-CM | POA: Diagnosis not present

## 2020-07-10 DIAGNOSIS — I10 Essential (primary) hypertension: Secondary | ICD-10-CM | POA: Diagnosis not present

## 2020-07-14 ENCOUNTER — Other Ambulatory Visit: Payer: Self-pay | Admitting: Osteopathic Medicine

## 2020-07-14 DIAGNOSIS — N400 Enlarged prostate without lower urinary tract symptoms: Secondary | ICD-10-CM

## 2020-07-17 DIAGNOSIS — E108 Type 1 diabetes mellitus with unspecified complications: Secondary | ICD-10-CM | POA: Diagnosis not present

## 2020-07-17 DIAGNOSIS — Z794 Long term (current) use of insulin: Secondary | ICD-10-CM | POA: Diagnosis not present

## 2020-08-07 DIAGNOSIS — E1165 Type 2 diabetes mellitus with hyperglycemia: Secondary | ICD-10-CM | POA: Diagnosis not present

## 2020-08-07 DIAGNOSIS — Z794 Long term (current) use of insulin: Secondary | ICD-10-CM | POA: Diagnosis not present

## 2020-08-17 DIAGNOSIS — Z794 Long term (current) use of insulin: Secondary | ICD-10-CM | POA: Diagnosis not present

## 2020-08-17 DIAGNOSIS — E108 Type 1 diabetes mellitus with unspecified complications: Secondary | ICD-10-CM | POA: Diagnosis not present

## 2020-08-28 DIAGNOSIS — R69 Illness, unspecified: Secondary | ICD-10-CM | POA: Diagnosis not present

## 2020-08-28 DIAGNOSIS — E1165 Type 2 diabetes mellitus with hyperglycemia: Secondary | ICD-10-CM | POA: Diagnosis not present

## 2020-08-28 DIAGNOSIS — M48061 Spinal stenosis, lumbar region without neurogenic claudication: Secondary | ICD-10-CM | POA: Diagnosis not present

## 2020-08-28 DIAGNOSIS — N1831 Chronic kidney disease, stage 3a: Secondary | ICD-10-CM | POA: Diagnosis not present

## 2020-08-28 DIAGNOSIS — Z87891 Personal history of nicotine dependence: Secondary | ICD-10-CM | POA: Diagnosis not present

## 2020-08-28 DIAGNOSIS — R32 Unspecified urinary incontinence: Secondary | ICD-10-CM | POA: Diagnosis not present

## 2020-08-28 DIAGNOSIS — Z7984 Long term (current) use of oral hypoglycemic drugs: Secondary | ICD-10-CM | POA: Diagnosis not present

## 2020-08-28 DIAGNOSIS — R350 Frequency of micturition: Secondary | ICD-10-CM | POA: Diagnosis not present

## 2020-08-28 DIAGNOSIS — Z794 Long term (current) use of insulin: Secondary | ICD-10-CM | POA: Diagnosis not present

## 2020-08-28 DIAGNOSIS — Z8673 Personal history of transient ischemic attack (TIA), and cerebral infarction without residual deficits: Secondary | ICD-10-CM | POA: Diagnosis not present

## 2020-08-31 DIAGNOSIS — M48062 Spinal stenosis, lumbar region with neurogenic claudication: Secondary | ICD-10-CM | POA: Diagnosis not present

## 2020-09-01 ENCOUNTER — Other Ambulatory Visit: Payer: Self-pay | Admitting: Orthopedic Surgery

## 2020-09-01 DIAGNOSIS — M545 Low back pain, unspecified: Secondary | ICD-10-CM

## 2020-09-05 ENCOUNTER — Ambulatory Visit (INDEPENDENT_AMBULATORY_CARE_PROVIDER_SITE_OTHER): Payer: Medicare HMO

## 2020-09-05 ENCOUNTER — Other Ambulatory Visit: Payer: Self-pay

## 2020-09-05 DIAGNOSIS — M545 Low back pain, unspecified: Secondary | ICD-10-CM | POA: Diagnosis not present

## 2020-09-05 DIAGNOSIS — G8929 Other chronic pain: Secondary | ICD-10-CM | POA: Diagnosis not present

## 2020-09-05 IMAGING — MR MR LUMBAR SPINE W/O CM
4 series · 27 of 48 positions shown · non-contrast
Comparison: MRI [DATE]

CLINICAL DATA: Low back pain for several years radiating to both
legs.

EXAM:
MRI LUMBAR SPINE WITHOUT CONTRAST
TECHNIQUE: Multiplanar, multisequence MR imaging of the lumbar spine was
performed. No intravenous contrast was administered.

[Series 2: T2 · sagittal · 4.0mm · 0.81mm/px · 7 of 17 slices shown (1 of 2)]
[im 1/17]
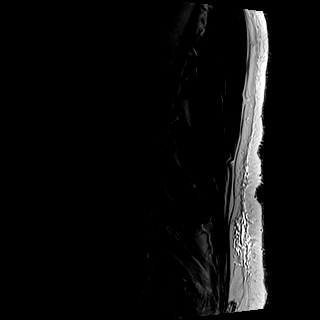
[im 3/17]
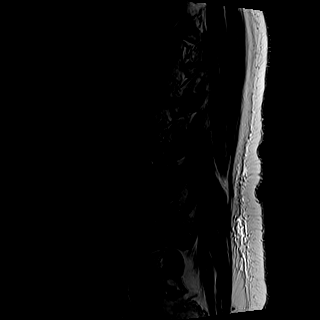
[im 6/17]
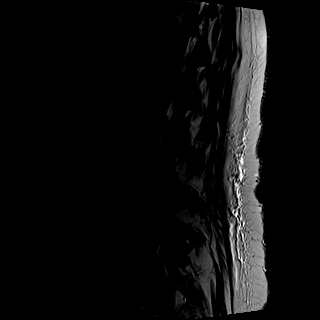
[im 9/17]
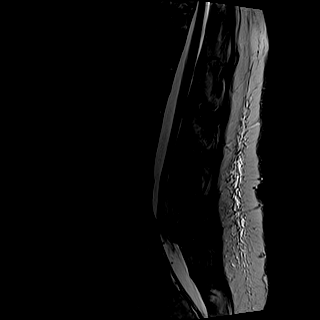
[im 11/17]
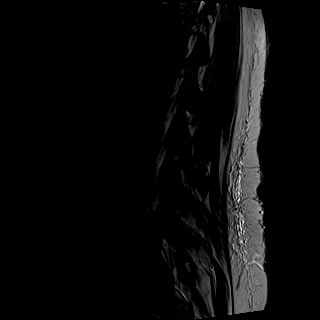
[im 14/17]
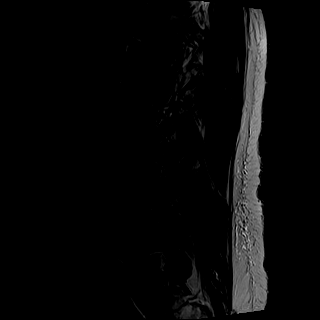
[im 17/17]
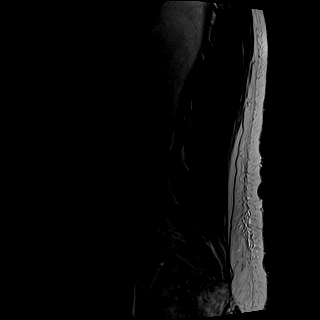

[Series 3: T1 · sagittal · 4.0mm · 0.41mm/px · 6 of 17 slices shown (1 of 2)]
[im 1/17]
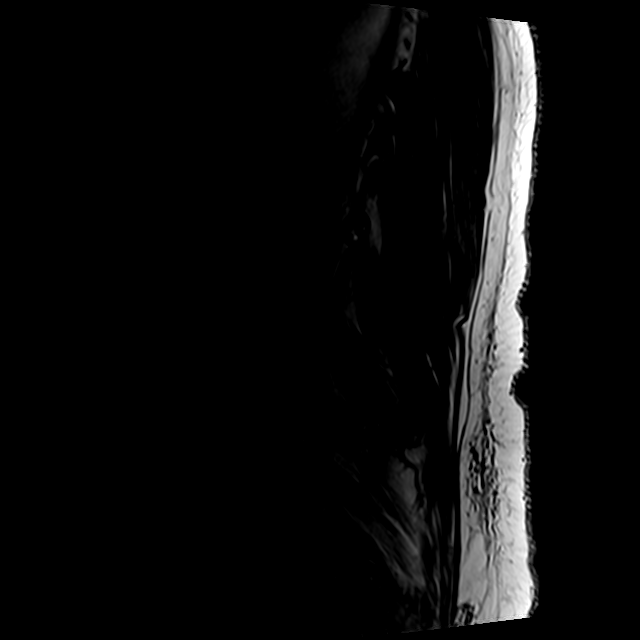
[im 3/17]
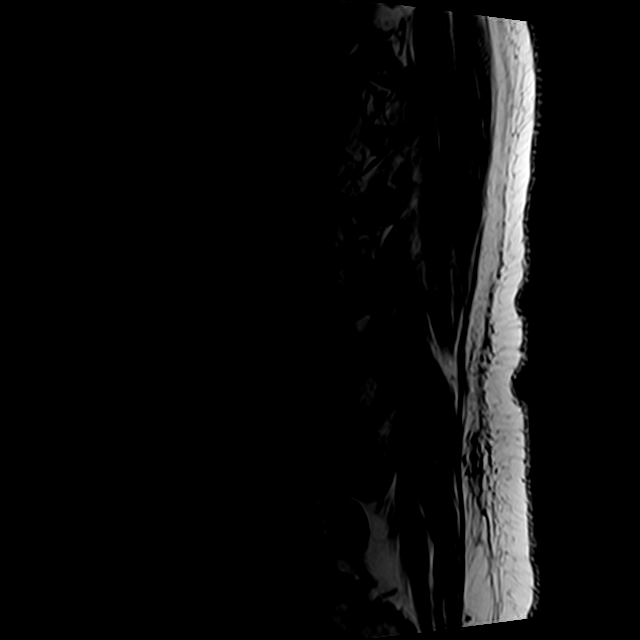
[im 6/17]
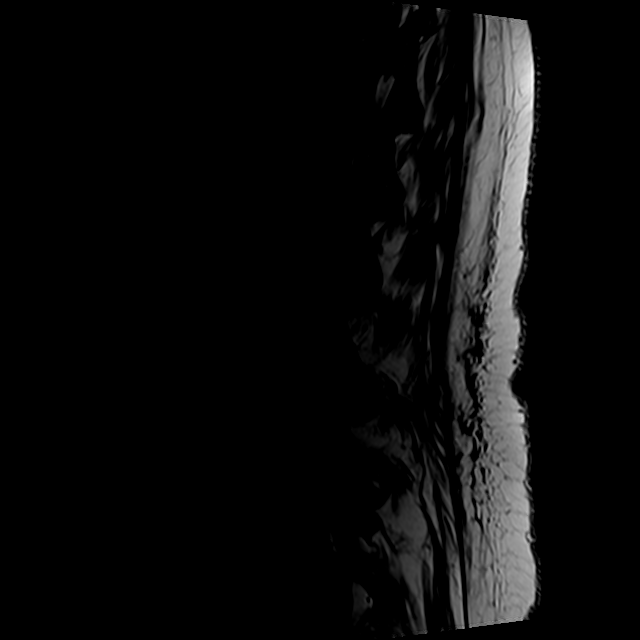
[im 9/17]
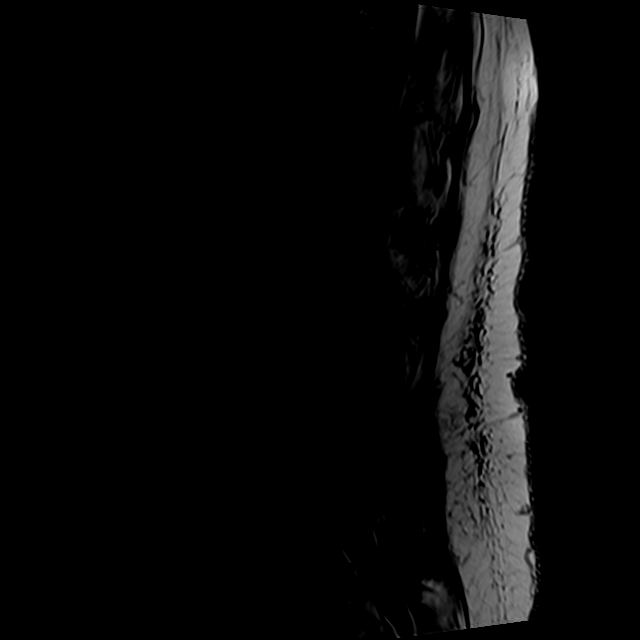
[im 11/17]
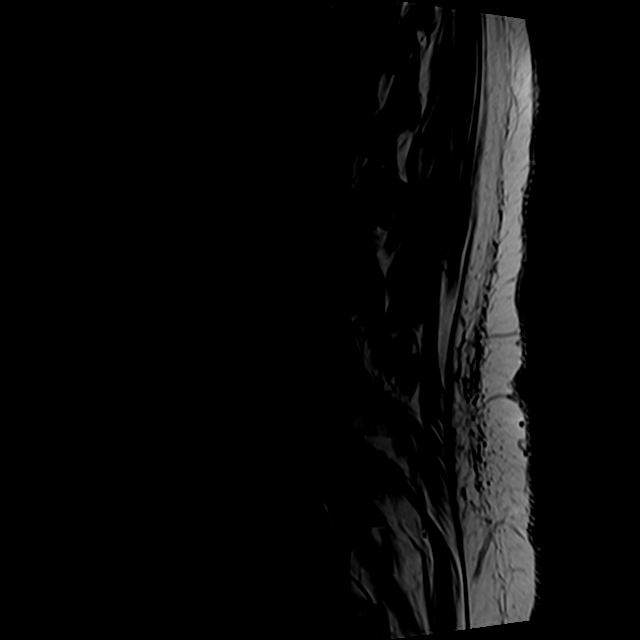
[im 14/17]
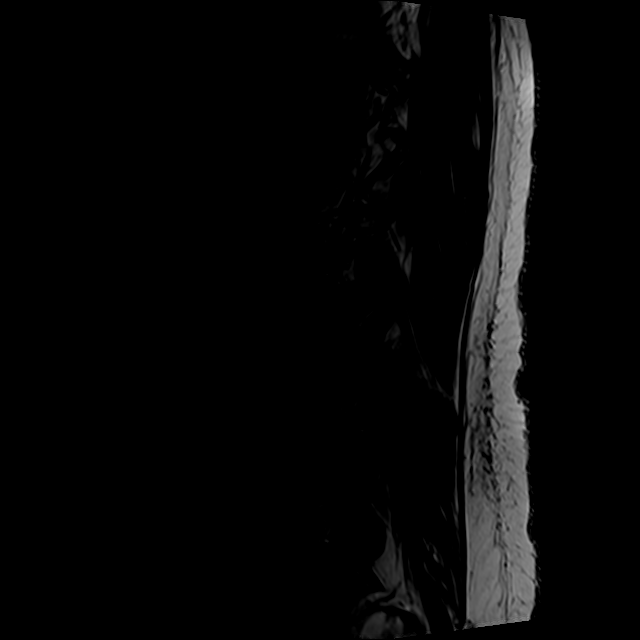

[Series 5: T2 · axial · 4.0mm · 0.78mm/px · z∈[-83,+127]mm · 11 of 43 slices shown (2 of 2)]
[im 3/43]
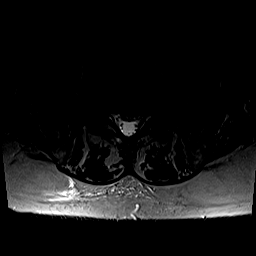
[im 6/43]
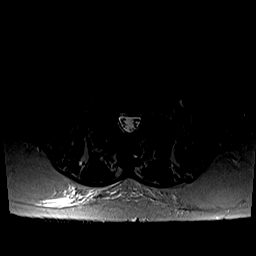
[im 8/43]
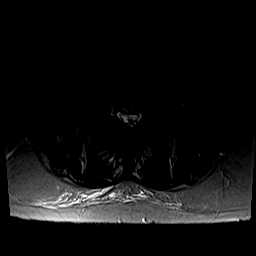
[im 14/43]
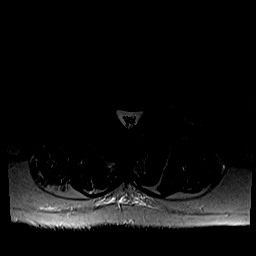
[im 19/43]
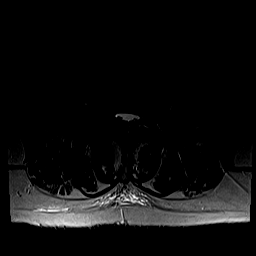
[im 22/43]
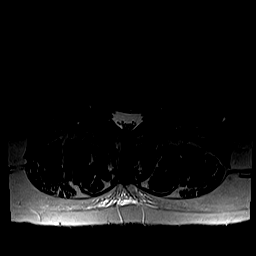
[im 24/43]
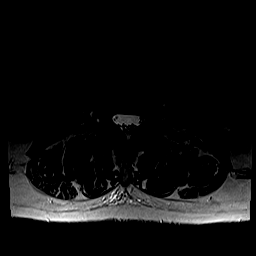
[im 29/43]
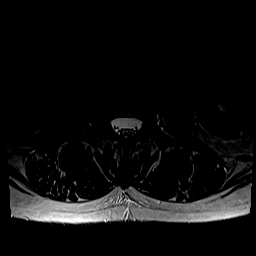
[im 35/43]
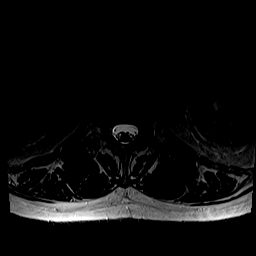
[im 37/43]
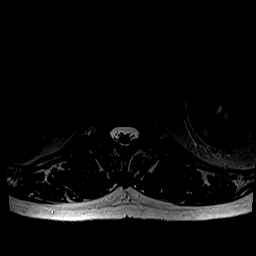
[im 40/43]
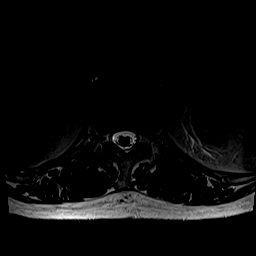

[Series 6: T1 · axial · 4.0mm · 0.39mm/px · z∈[-68,+112]mm · 3 of 43 slices shown (2 of 2)]
[im 6/43]
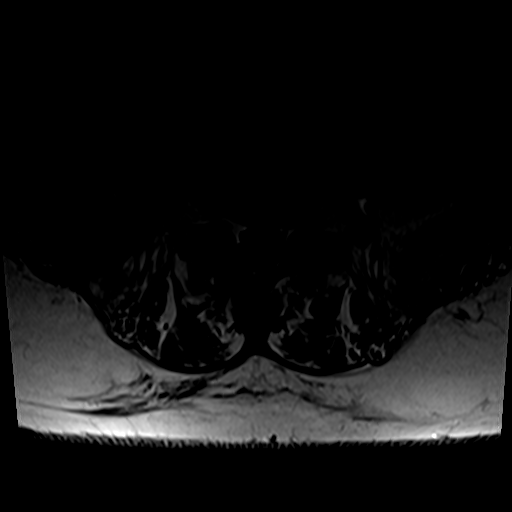
[im 22/43]
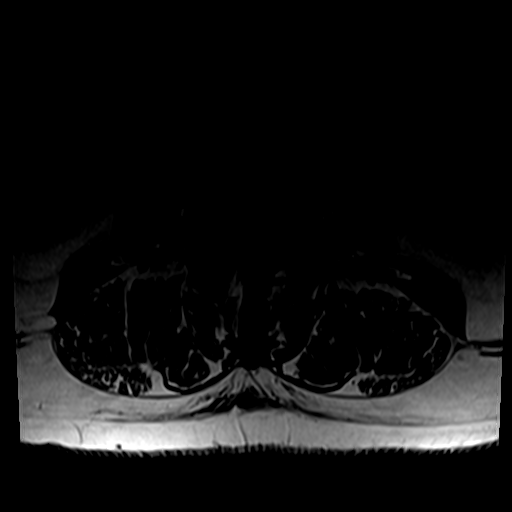
[im 37/43]
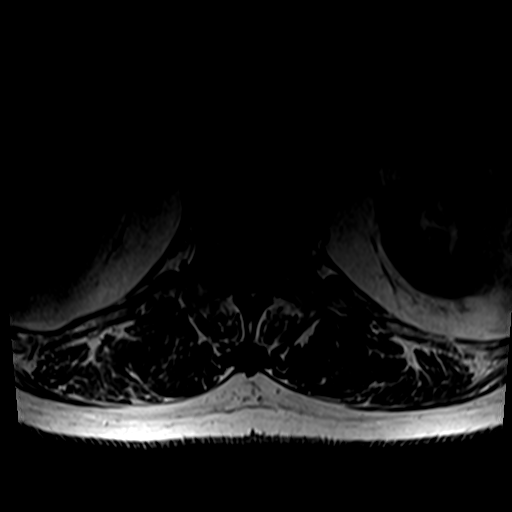

[27 of 48 positions shown; findings below may reference images not displayed]

FINDINGS: Segmentation:  5 lumbar type vertebral bodies.

Alignment: Normal except for 4 mm of degenerative anterolisthesis at
L4-5.

Vertebrae:  No fracture or primary bone lesion.

Conus medullaris and cauda equina: Conus extends to the L1-2 level.
Conus and cauda equina appear normal.

Paraspinal and other soft tissues: No significant finding.

Disc levels:

T12-L1 through L2-3: Normal

L3-4: Mild bulging of the disc, with a focal left foraminal to
extraforaminal herniation. Facet and ligamentous hypertrophy.
Stenosis of both lateral recesses but without visible neural
compression. The left L3 nerve could be affected by the lateral
foraminal to extraforaminal disease.

L4-5: Advanced bilateral facet arthropathy with 4 mm of
anterolisthesis. Circumferential bulging of the disc. Severe
multifactorial spinal stenosis that could cause neural compression
on either or both sides. Similar appearance to the study of last
year.

L5-S1: Minimal disc bulge. Mild facet osteoarthritis. No stenosis.
No change.
IMPRESSION: L4-5: Multifactorial spinal stenosis that could cause neural
compression on either or both sides. No significant change since
last year. Stenosis due to bilateral facet arthropathy with 4 mm of
anterolisthesis in combination with circumferential bulging of the
disc.

L3-4: Disc bulge. Bilateral facet degeneration and hypertrophy. Left
lateral foraminal to extraforaminal osteophyte and disc protrusion
could focally affect the left L3 nerve. Similar appearance to the
study of last year.

## 2020-09-11 DIAGNOSIS — M6281 Muscle weakness (generalized): Secondary | ICD-10-CM | POA: Diagnosis not present

## 2020-09-11 DIAGNOSIS — M542 Cervicalgia: Secondary | ICD-10-CM | POA: Diagnosis not present

## 2020-09-11 DIAGNOSIS — M5451 Vertebrogenic low back pain: Secondary | ICD-10-CM | POA: Diagnosis not present

## 2020-09-16 DIAGNOSIS — E108 Type 1 diabetes mellitus with unspecified complications: Secondary | ICD-10-CM | POA: Diagnosis not present

## 2020-09-16 DIAGNOSIS — Z794 Long term (current) use of insulin: Secondary | ICD-10-CM | POA: Diagnosis not present

## 2020-09-18 DIAGNOSIS — M5451 Vertebrogenic low back pain: Secondary | ICD-10-CM | POA: Diagnosis not present

## 2020-09-18 DIAGNOSIS — M6281 Muscle weakness (generalized): Secondary | ICD-10-CM | POA: Diagnosis not present

## 2020-09-18 DIAGNOSIS — M542 Cervicalgia: Secondary | ICD-10-CM | POA: Diagnosis not present

## 2020-09-25 DIAGNOSIS — M5451 Vertebrogenic low back pain: Secondary | ICD-10-CM | POA: Diagnosis not present

## 2020-09-25 DIAGNOSIS — M542 Cervicalgia: Secondary | ICD-10-CM | POA: Diagnosis not present

## 2020-09-25 DIAGNOSIS — M6281 Muscle weakness (generalized): Secondary | ICD-10-CM | POA: Diagnosis not present

## 2020-09-28 DIAGNOSIS — M6281 Muscle weakness (generalized): Secondary | ICD-10-CM | POA: Diagnosis not present

## 2020-09-28 DIAGNOSIS — M542 Cervicalgia: Secondary | ICD-10-CM | POA: Diagnosis not present

## 2020-09-28 DIAGNOSIS — M5451 Vertebrogenic low back pain: Secondary | ICD-10-CM | POA: Diagnosis not present

## 2020-10-12 DIAGNOSIS — E119 Type 2 diabetes mellitus without complications: Secondary | ICD-10-CM | POA: Diagnosis not present

## 2020-10-12 DIAGNOSIS — I10 Essential (primary) hypertension: Secondary | ICD-10-CM | POA: Diagnosis not present

## 2020-10-12 DIAGNOSIS — E1159 Type 2 diabetes mellitus with other circulatory complications: Secondary | ICD-10-CM | POA: Diagnosis not present

## 2020-10-12 DIAGNOSIS — R030 Elevated blood-pressure reading, without diagnosis of hypertension: Secondary | ICD-10-CM | POA: Diagnosis not present

## 2020-10-12 DIAGNOSIS — R0989 Other specified symptoms and signs involving the circulatory and respiratory systems: Secondary | ICD-10-CM | POA: Diagnosis not present

## 2020-10-12 DIAGNOSIS — E1165 Type 2 diabetes mellitus with hyperglycemia: Secondary | ICD-10-CM | POA: Diagnosis not present

## 2020-10-12 DIAGNOSIS — R69 Illness, unspecified: Secondary | ICD-10-CM | POA: Diagnosis not present

## 2020-11-03 DIAGNOSIS — Z794 Long term (current) use of insulin: Secondary | ICD-10-CM | POA: Diagnosis not present

## 2020-11-03 DIAGNOSIS — E108 Type 1 diabetes mellitus with unspecified complications: Secondary | ICD-10-CM | POA: Diagnosis not present

## 2020-11-04 ENCOUNTER — Other Ambulatory Visit: Payer: Self-pay | Admitting: Osteopathic Medicine

## 2020-11-04 DIAGNOSIS — N400 Enlarged prostate without lower urinary tract symptoms: Secondary | ICD-10-CM

## 2020-11-05 ENCOUNTER — Other Ambulatory Visit: Payer: Self-pay | Admitting: Osteopathic Medicine

## 2020-11-05 DIAGNOSIS — N400 Enlarged prostate without lower urinary tract symptoms: Secondary | ICD-10-CM

## 2020-12-03 DIAGNOSIS — Z794 Long term (current) use of insulin: Secondary | ICD-10-CM | POA: Diagnosis not present

## 2020-12-03 DIAGNOSIS — E108 Type 1 diabetes mellitus with unspecified complications: Secondary | ICD-10-CM | POA: Diagnosis not present

## 2021-02-23 ENCOUNTER — Ambulatory Visit: Payer: Medicare HMO | Admitting: Gastroenterology

## 2021-03-11 LAB — BASIC METABOLIC PANEL: Creatinine: 1.5 — AB (ref 0.6–1.3)

## 2021-04-08 ENCOUNTER — Encounter: Payer: Self-pay | Admitting: Gastroenterology

## 2021-04-08 ENCOUNTER — Ambulatory Visit: Payer: Medicare Other | Admitting: Gastroenterology

## 2021-04-08 VITALS — BP 150/88 | HR 68 | Ht 66.0 in | Wt 220.0 lb

## 2021-04-08 DIAGNOSIS — R197 Diarrhea, unspecified: Secondary | ICD-10-CM | POA: Diagnosis not present

## 2021-04-08 DIAGNOSIS — Z86010 Personal history of colonic polyps: Secondary | ICD-10-CM

## 2021-04-08 DIAGNOSIS — K648 Other hemorrhoids: Secondary | ICD-10-CM | POA: Diagnosis not present

## 2021-04-08 NOTE — Patient Instructions (Signed)
You can use over the counter preparation H suppositories rectally for hemorrhoid symptoms.   Call our office if you decide you want to pursue a early colonoscopy. If not then we will send a letter next year when it gets closer to your recall date.  Due to recent changes in healthcare laws, you may see the results of your imaging and laboratory studies on MyChart before your provider has had a chance to review them.  We understand that in some cases there may be results that are confusing or concerning to you. Not all laboratory results come back in the same time frame and the provider may be waiting for multiple results in order to interpret others.  Please give [place] 48 hours in order for your provider to thoroughly review all the results before contacting the office for clarification of your results.   The [facility] providers would like to encourage you to use [facility] to communicate with providers for non-urgent requests or questions.  Due to long hold times on the telephone, sending your provider a message by [facility] may be a faster and more efficient way to get a response.  Please allow 48 business hours for a response.  Please remember that this is for non-urgent requests.   Thank you for choosing me and [facility].  Pricilla Riffle. Dagoberto Ligas., MD., Marval Regal

## 2021-04-08 NOTE — Progress Notes (Signed)
    History of Present Illness: This is a 75 year old male with chronic intermittent diarrhea and chronic intermittent small-volume rectal bleeding.  He is accompanied by his wife who provides some of the history.  He was diagnosed with an extensive left leg DVT which is currently being treated with Eliquis.  Evaluation for hypercoagulable state was apparently unrevealing.  He has had many years of difficulties with intermittent diarrhea.  He is currently taking a probiotic called AloeGold 2 po daily which has substantially improved his symptoms.  He has episodes of diarrhea once every several weeks now which is a major improvement.  When his diarrhea flares he often notes small amounts of rectal bleeding and the rectal bleeding has been present for years.  He underwent colonoscopy in November 2020 showing 1 adenomatous polyp and multiple hyperplastic polyps.  Moderate-sized internal hemorrhoids were noted.  The patient relates he was recommended to follow-up with GI to consider scheduling his surveillance colonoscopy earlier than 3 years as previously recommended.  The patient and his wife relate that they lost their son due to [place] several months ago and they have been having a difficult time.  They plan on moving in the near future and selling their house.   Current Medications, Allergies, Past Medical History, Past Surgical History, Family History and Social History were reviewed in [employer] record.   Physical Exam: General: Well developed, well nourished, no acute distress Head: Normocephalic and atraumatic Eyes: Sclerae anicteric, EOMI Ears: Normal auditory acuity Mouth: Not examined, mask on during Covid-19 pandemic Lungs: Clear throughout to auscultation Heart: Regular rate and rhythm; no murmurs, rubs or bruits Abdomen: Soft, non tender and non distended. No masses, hepatosplenomegaly or hernias noted. Normal Bowel sounds Rectal: Not done Musculoskeletal:  Symmetrical with no gross deformities  Pulses:  Normal pulses noted Extremities: No clubbing, cyanosis, edema or deformities noted Neurological: Alert oriented x 4, grossly nonfocal Psychological:  Alert and cooperative. Normal mood and affect   Assessment and Recommendations:  Intermittent diarrhea, which is a long term problem, suspected IBS or food/beverage intolerance.  Continue current probiotic daily.  Avoid foods and beverages that exacerbate symptoms.  Imodium A-D twice daily as needed for worsening diarrhea. Intermittent hemorrhoidal bleeding.  Preparation H suppositories PR daily for 3 to 5 days when hemorrhoid symptoms are active. Personal history of 1 adenomatous colon polyp (73mm) in November 2020.  A 3-year interval surveillance colonoscopy was previously recommended for November 2023.  Given his intermittent rectal bleeding it is reasonable to proceed with colonoscopy prior to his surveillance date however since we have established a diagnosis to explain his rectal bleeding it is also reasonable to wait until his 3-year surveillance date.  After a discussion with shared decision making they will consider scheduling colonoscopy in the near future versus waiting until November 2023. Extensive left leg DVT on Eliquis.  A temporary hold of Eliquis would be required for colonoscopy with possible biopsy and polypectomy.  This would need to be cleared by Dr. Hartford Poli.

## 2021-05-09 HISTORY — PX: OTHER SURGICAL HISTORY: SHX169

## 2021-05-11 ENCOUNTER — Emergency Department
Admission: EM | Admit: 2021-05-11 | Discharge: 2021-05-11 | Discharge status: Against Medical Advice | Payer: Medicare Other | Source: Home / Self Care

## 2021-05-11 ENCOUNTER — Other Ambulatory Visit: Payer: Self-pay

## 2021-05-13 LAB — HEMOGLOBIN A1C: Hemoglobin A1C: 10.9

## 2021-07-13 ENCOUNTER — Other Ambulatory Visit: Payer: Self-pay

## 2021-07-13 ENCOUNTER — Encounter: Payer: Self-pay | Admitting: Family Medicine

## 2021-07-14 ENCOUNTER — Encounter: Payer: Medicare HMO | Admitting: Family Medicine

## 2021-07-15 ENCOUNTER — Encounter: Payer: Self-pay | Admitting: Family Medicine

## 2021-08-06 ENCOUNTER — Ambulatory Visit (INDEPENDENT_AMBULATORY_CARE_PROVIDER_SITE_OTHER): Payer: Medicare HMO | Admitting: Family Medicine

## 2021-08-06 ENCOUNTER — Encounter: Payer: Self-pay | Admitting: Family Medicine

## 2021-08-06 VITALS — BP 93/61 | HR 70 | Temp 97.5°F | Ht 71.75 in | Wt 205.8 lb

## 2021-08-06 DIAGNOSIS — I951 Orthostatic hypotension: Secondary | ICD-10-CM

## 2021-08-06 DIAGNOSIS — R634 Abnormal weight loss: Secondary | ICD-10-CM

## 2021-08-06 DIAGNOSIS — I10 Essential (primary) hypertension: Secondary | ICD-10-CM

## 2021-08-06 DIAGNOSIS — Z7901 Long term (current) use of anticoagulants: Secondary | ICD-10-CM

## 2021-08-06 DIAGNOSIS — Z0000 Encounter for general adult medical examination without abnormal findings: Secondary | ICD-10-CM | POA: Diagnosis not present

## 2021-08-06 DIAGNOSIS — E1121 Type 2 diabetes mellitus with diabetic nephropathy: Secondary | ICD-10-CM | POA: Diagnosis not present

## 2021-08-06 DIAGNOSIS — I480 Paroxysmal atrial fibrillation: Secondary | ICD-10-CM

## 2021-08-06 DIAGNOSIS — Z86718 Personal history of other venous thrombosis and embolism: Secondary | ICD-10-CM

## 2021-08-06 NOTE — Progress Notes (Signed)
Office Note 08/09/2021  CC:  Chief Complaint  Patient presents with   Establish Care   HPI:  Benjamin Powell is a 76 y.o. male who is here accompanied by his wife [name] to establish care, health maintenance exam, and f/u HTN, orthostatic hypotension, DM. Patient's most recent primary MD.  [facility]. Old records were reviewed prior to or during today's visit.  History of difficult to control hypertension as well as orthostatic hypotension. History of relatively recent falls due to orthostatic dizziness/hypotension.  His spironolactone was discontinued and Flomax was discontinued and the orthostatic hypotension problem seems to have decreased significantly.  He does get home health PT and nursing.  He is independent of ADLs.  He does not drive. Lisinopril 20 mg was started about a month ago and this seems to have led to great blood pressure at home: 130 over 60s to 70s.  Says glucoses fasting 1 20-1 30 range, up to 300 in the afternoon/evenings. Followed by Dr. Jenel Lucks for diabetes.  History of PAF as well as DVT.  He is on Eliquis. Followed by Dr. Christell Faith, [facility] vascular.  Patient has lost approximately 35 pounds in the last 6 months, without known reason.  Says his appetite and p.o. intake are good. Denies abdominal pain, nausea or vomiting, or diarrhea.  Past Medical History:  Diagnosis Date   Anxiety and depression    BPH without obstruction/lower urinary tract symptoms    Cervical spondylosis    Surgery 2021   Chronic renal insufficiency, stage 3 (moderate) (HCC)    Frequent falls    GERD (gastroesophageal reflux disease)    Hemorrhoids    History of adenomatous polyp of colon    History of CVA (cerebrovascular accident) 10/2016   CTA head and neck with no occlusive dz. +advanced chronic ischemic dz changes on MR   History of DVT (deep vein thrombosis) 2022   L leg.  Hypercoag w/u NEG.   Hypertension    Treatment resistant   Impaired mobility and ADLs     OSA on CPAP    PAF (paroxysmal atrial fibrillation) (HCC)    Recurrent nephrolithiasis    Type 2 diabetes with nephropathy [facility])    endocrinologist   Urge incontinence     Past Surgical History:  Procedure Laterality Date   ANKLE SURGERY  1993   left   ANTERIOR CERVICAL DECOMP/DISCECTOMY FUSION N/A 10/02/2019   Procedure: ANTERIOR CERVICAL DECOMPRESSION FUSION CERVICAL THREE-FOUR WITH INSTRUMENTATION AND ALLOGRAFT;  Surgeon: Phylliss Bob, MD;  Location: [place];  Service: Orthopedics;  Laterality: N/A;   CATARACT EXTRACTION Right    CHOLECYSTECTOMY  1990   COLONOSCOPY     2013 multiple hyperplastic polyps. 04/08/19 Many hyperplastic/inflammatory polyps, adenoma x1. Recall nov 2023   ERCP  1990   Hammer Toe Repair Right 09/16/2016   RT #5   Partial Excision Phalanx Left 09/16/2016   LT#5   rib fixation  05/2021   TONSILLECTOMY AND ADENOIDECTOMY  1998   and sinus for sleep apnea   TRANSTHORACIC ECHOCARDIOGRAM  07/07/2017   EF 55-60%, mild MR, no shunt    Family History  Problem Relation Age of Onset   Pancreatic cancer Mother    Cancer Father    Early death Father    Colitis Sister    Diabetes Sister    Stroke Brother    Colitis Brother    Diabetes Brother    Heart attack Brother    Cancer Brother    Stroke Brother  Colon cancer Neg Hx    Esophageal cancer Neg Hx    Rectal cancer Neg Hx    Stomach cancer Neg Hx     Social History   Socioeconomic History   Marital status: Married    Spouse name: Not on file   Number of children: 2   Years of education: Not on file   Highest education level: Not on file  Occupational History   Not on file  Tobacco Use   Smoking status: Former    Packs/day: 0.30    Years: 50.00    Pack years: 15.00    Types: Cigarettes    Quit date: 08/08/2019    Years since quitting: 2.0   Smokeless tobacco: Never  Vaping Use   Vaping Use: Never used  Substance and Sexual Activity   Alcohol use: No   Drug use: No   Sexual activity:  Not on file  Other Topics Concern   Not on file  Social History Narrative   Married   Educ: college   Occup:    80 pack-yr tobacco, quit 2022.   No alc/drugs.   Social Determinants of Health   Financial Resource Strain: Not on file  Food Insecurity: Not on file  Transportation Needs: Not on file  Physical Activity: Not on file  Stress: Not on file  Social Connections: Not on file  Intimate Partner Violence: Not on file    Outpatient Encounter Medications as of 08/06/2021  Medication Sig   AMBULATORY NON FORMULARY MEDICATION Medication Name: Aloe Gold probiotic daily   apixaban (ELIQUIS) 5 MG TABS tablet Take 5 mg by mouth 2 (two) times daily.   ascorbic acid (VITAMIN C) 1000 MG tablet Take 1 tablet by mouth daily.   aspirin 81 MG EC tablet Take 1 tablet by mouth daily.   BD PEN NEEDLE NANO 2ND GEN 32G X 4 MM MISC 4 (four) times daily. as directed   Cholecalciferol (VITAMIN D-1000 MAX ST) 1000 units tablet Take 1 tablet (1,000 Units total) by mouth daily.   Cholecalciferol 125 MCG (5000 UT) TABS Take by mouth.   Flaxseed Oil (LINSEED OIL) OIL Take 1 capsule by mouth daily.   HAWTHORN BERRY PO Take by mouth daily. 4:1 extract   HUMALOG KWIKPEN 100 UNIT/ML KwikPen SMARTSIG:10 Unit(s) SUB-Q   insulin aspart (NOVOLOG) 100 UNIT/ML FlexPen INJECT INTO THE SKIN 10 UNITS WITH MEALS- EATS LUNCH AND DINNER.   insulin glargine, 2 Unit Dial, (TOUJEO MAX SOLOSTAR) 300 UNIT/ML Solostar Pen Inject into the skin.   lisinopril (ZESTRIL) 20 MG tablet Take 20 mg by mouth daily.   metoprolol succinate (TOPROL-XL) 50 MG 24 hr tablet Take 50 mg by mouth daily.   Omega-3 Fatty Acids (FISH OIL) 1000 MG CAPS Take 2 capsules by mouth daily.   sertraline (ZOLOFT) 50 MG tablet Take 1 tablet by mouth daily.   zinc gluconate 50 MG tablet Take 1 tablet by mouth daily.   [DISCONTINUED] citalopram (CELEXA) 10 MG tablet Take 10 mg by mouth daily.   [DISCONTINUED] metoprolol succinate (TOPROL-XL) 25 MG 24 hr  tablet Follow up with your PCP for re-evaluation in 2-3 days before resuming prior dose (Take 1 tablet (25 mg total) by mouth daily)   No facility-administered encounter medications on file as of 08/06/2021.    Allergies  Allergen Reactions   Amlodipine Other (See Comments)    Gingival hyperplasia   Review of Systems  Constitutional:  Positive for fatigue and unexpected weight change. Negative for appetite change,  chills and fever.  HENT:  Negative for congestion, dental problem, ear pain and sore throat.   Eyes:  Negative for discharge, redness and visual disturbance.  Respiratory:  Negative for cough, chest tightness, shortness of breath and wheezing.   Cardiovascular:  Negative for chest pain, palpitations and leg swelling.  Gastrointestinal:  Negative for abdominal pain, blood in stool, diarrhea, nausea and vomiting.  Genitourinary:  Negative for difficulty urinating, dysuria, flank pain, frequency, hematuria and urgency.  Musculoskeletal:  Positive for back pain. Negative for arthralgias, joint swelling, myalgias and neck stiffness.  Skin:  Negative for pallor and rash.  Neurological:  Negative for dizziness, speech difficulty, weakness and headaches.  Hematological:  Negative for adenopathy. Does not bruise/bleed easily.  Psychiatric/Behavioral:  Negative for confusion and sleep disturbance. The patient is not nervous/anxious.      Physical Exam  Blood pressure 93/61, pulse 70, temperature (!) 97.5 F (36.4 C), height 5' 11.75" (1.822 m), weight 205 lb 12.8 oz (93.4 kg), SpO2 99 %.Body mass index is 28.11 kg/m. Gen: Alert, chronically ill-appearing.  Patient is oriented to person, place, time, and situation. AFFECT: pleasant, lucid thought and speech. ENT: Ears: EACs clear, normal epithelium.  TMs with good light reflex and landmarks bilaterally.  Eyes: no injection, icteris, swelling, or exudate.  EOMI, PERRLA. Nose: no drainage or turbinate edema/swelling.  No injection or  focal lesion.  Mouth: lips without lesion/swelling.  Oral mucosa pink and moist.  Dentition intact and without obvious caries or gingival swelling.  Oropharynx without erythema, exudate, or swelling.  Neck: supple/nontender.  No LAD, mass, or TM.  Carotid pulses 2+ bilaterally, without bruits. CV: RRR, no m/r/g.   LUNGS: CTA bilat, nonlabored resps, good aeration in all lung fields. ABD: soft, NT, ND, BS normal.  No hepatospenomegaly or mass.  No bruits. EXT: no clubbing, cyanosis, or edema.  Musculoskeletal: no joint swelling, erythema, warmth, or tenderness.  ROM of all joints intact. Skin - no sores or suspicious lesions or rashes or color changes  Pertinent labs:  Last CBC Lab Results  Component Value Date   WBC 8.8 08/06/2021   HGB 16.0 08/06/2021   HCT 47.4 08/06/2021   MCV 92.2 08/06/2021   MCH 31.1 08/06/2021   RDW 12.6 08/06/2021   PLT 211 88/32/5498   Last metabolic panel Lab Results  Component Value Date   GLUCOSE 181 (H) 08/06/2021   NA 140 08/06/2021   K 4.5 08/06/2021   CL 104 08/06/2021   CO2 24 08/06/2021   BUN 23 08/06/2021   CREATININE 1.32 (H) 08/06/2021   GFRNONAA 38 (L) 09/27/2019   CALCIUM 9.9 08/06/2021   PROT 6.6 08/06/2021   ALBUMIN 4.0 09/27/2019   BILITOT 0.4 08/06/2021   ALKPHOS 69 09/27/2019   AST 14 08/06/2021   ALT 33 08/06/2021   ANIONGAP 12 09/27/2019   Last lipids Lab Results  Component Value Date   CHOL 99 09/22/2017   HDL 36 (L) 09/22/2017   LDLCALC 41 09/22/2017   TRIG 139 09/22/2017   CHOLHDL 2.8 09/22/2017   Last hemoglobin A1c Lab Results  Component Value Date   HGBA1C 10.9 05/13/2021   Last thyroid functions Lab Results  Component Value Date   TSH 1.43 08/06/2021   Last vitamin B12 and Folate Lab Results  Component Value Date   VITAMINB12 285 05/08/2015   FOLATE 14.8 05/08/2015   Lab Results  Component Value Date   PSA 2.5 04/07/2016   PSA 2.35 10/31/2014   PSA 3.12 07/18/2014  ASSESSMENT AND PLAN:    New patient, establishing care.  1.  Labile hypertension, history of orthostatic hypotension. Stable currently on lisinopril 20 mg a day. Blood pressure low here today but he is asymptomatic. Electrolytes and creatinine today.  2 chronic renal insufficiency stage III. Avoid NSAIDs.  Hydrate adequately. Electrolytes and creatinine today.  #3 abnormal weight loss. Unknown etiology. Patient reports adequate/unchanged caloric intake. Checking CBC, c-Met, lipase, home Hemoccults x3 and UA today.  #4 Health maintenance exam: Reviewed age and gender appropriate health maintenance issues (prudent diet, regular exercise, health risks of tobacco and excessive alcohol, use of seatbelts, fire alarms in home, use of sunscreen).  Also reviewed age and gender appropriate health screening as well as vaccine recommendations. Vaccines: UTD Labs: CBC, c-Met.   Prostate ca screening: No further screening due to age.  However, upon patient return in 2 weeks we will check PSA due to history of weight loss. Colon ca screening: recall 03/2022  #5 type 2 diabetes. Continue Toujeo 26 units a day and Humalog 10 units 2-3 times a day with meals and Ozempic 0.5 mg weekly Continue follow-up with Dr. Francetta Found.  #6: History of DVT as well as PAF. Continue Eliquis 5 mg twice daily as well as appropriate follow-up with his vascular doctor Dr. Vincente Poli. CBC today.  #7 cerebrovascular disease. History of CVA without residual deficit. Continue aspirin 81 mg a day.  An After Visit Summary was printed and given to the patient.  Signed:  Crissie Sickles, MD           08/09/2021

## 2021-08-07 LAB — CBC WITH DIFFERENTIAL/PLATELET
Absolute Monocytes: 642 cells/uL (ref 200–950)
Basophils Absolute: 53 cells/uL (ref 0–200)
Basophils Relative: 0.6 %
Eosinophils Absolute: 167 cells/uL (ref 15–500)
Eosinophils Relative: 1.9 %
HCT: 47.4 % (ref 38.5–50.0)
Hemoglobin: 16 g/dL (ref 13.2–17.1)
Lymphs Abs: 1998 cells/uL (ref 850–3900)
MCH: 31.1 pg (ref 27.0–33.0)
MCHC: 33.8 g/dL (ref 32.0–36.0)
MCV: 92.2 fL (ref 80.0–100.0)
MPV: 11.6 fL (ref 7.5–12.5)
Monocytes Relative: 7.3 %
Neutro Abs: 5940 cells/uL (ref 1500–7800)
Neutrophils Relative %: 67.5 %
Platelets: 211 10*3/uL (ref 140–400)
RBC: 5.14 10*6/uL (ref 4.20–5.80)
RDW: 12.6 % (ref 11.0–15.0)
Total Lymphocyte: 22.7 %
WBC: 8.8 10*3/uL (ref 3.8–10.8)

## 2021-08-07 LAB — COMPREHENSIVE METABOLIC PANEL
AG Ratio: 1.4 (calc) (ref 1.0–2.5)
ALT: 33 U/L (ref 9–46)
AST: 14 U/L (ref 10–35)
Albumin: 3.9 g/dL (ref 3.6–5.1)
Alkaline phosphatase (APISO): 73 U/L (ref 35–144)
BUN/Creatinine Ratio: 17 (calc) (ref 6–22)
BUN: 23 mg/dL (ref 7–25)
CO2: 24 mmol/L (ref 20–32)
Calcium: 9.9 mg/dL (ref 8.6–10.3)
Chloride: 104 mmol/L (ref 98–110)
Creat: 1.32 mg/dL — ABNORMAL HIGH (ref 0.70–1.28)
Globulin: 2.7 g/dL (calc) (ref 1.9–3.7)
Glucose, Bld: 181 mg/dL — ABNORMAL HIGH (ref 65–99)
Potassium: 4.5 mmol/L (ref 3.5–5.3)
Sodium: 140 mmol/L (ref 135–146)
Total Bilirubin: 0.4 mg/dL (ref 0.2–1.2)
Total Protein: 6.6 g/dL (ref 6.1–8.1)

## 2021-08-07 LAB — TSH: TSH: 1.43 mIU/L (ref 0.40–4.50)

## 2021-08-07 LAB — LIPASE: Lipase: 23 U/L (ref 7–60)

## 2021-08-08 LAB — URINE CULTURE
MICRO NUMBER:: 13209734
SPECIMEN QUALITY:: ADEQUATE

## 2021-08-08 LAB — URINALYSIS, ROUTINE W REFLEX MICROSCOPIC
Bacteria, UA: NONE SEEN /HPF
Bilirubin Urine: NEGATIVE
Hgb urine dipstick: NEGATIVE
Hyaline Cast: NONE SEEN /LPF
Ketones, ur: NEGATIVE
Nitrite: NEGATIVE
RBC / HPF: NONE SEEN /HPF (ref 0–2)
Specific Gravity, Urine: 1.023 (ref 1.001–1.035)
pH: <=5 (ref 5.0–8.0)

## 2021-08-08 LAB — MICROSCOPIC MESSAGE

## 2021-08-09 ENCOUNTER — Telehealth: Payer: Self-pay

## 2021-08-09 ENCOUNTER — Encounter: Payer: Self-pay | Admitting: Family Medicine

## 2021-08-09 ENCOUNTER — Telehealth: Payer: Self-pay | Admitting: Family Medicine

## 2021-08-09 MED ORDER — AMOXICILLIN 875 MG PO TABS
875.0000 mg | ORAL_TABLET | Freq: Two times a day (BID) | ORAL | 0 refills | Status: AC
Start: 2021-08-09 — End: 2021-08-19

## 2021-08-09 NOTE — Telephone Encounter (Signed)
Okay for orders? 

## 2021-08-09 NOTE — Telephone Encounter (Addendum)
Med pending. Need to verify pharmacy ? ?----- Message from Tammi Sou, MD sent at 08/09/2021  9:08 AM EDT ----- ?All labs stable except his urine did show bacteria. ?Please prescribe amoxicillin 875 mg, 1 tablet twice a day x 5 days, #10, no refill. ?

## 2021-08-09 NOTE — Telephone Encounter (Signed)
Approved VO given to April for SN.  ?

## 2021-08-09 NOTE — Telephone Encounter (Signed)
Yes, okay.

## 2021-08-09 NOTE — Telephone Encounter (Signed)
[place] Nurse ? ?Needing VO nursing for once a week for 6 weeks. ?2 PRN's ? ? ?[place] [phone] ? ?

## 2021-08-11 NOTE — Telephone Encounter (Signed)
Benjamin Powell (home health) [phone] ? ?Needing VO for PT ? ?2 weeks, week 4. ? ? ? ?

## 2021-08-12 NOTE — Telephone Encounter (Signed)
VO approved. LM for [facility] regarding PT orders ?

## 2021-08-12 NOTE — Telephone Encounter (Signed)
Okay for orders? 

## 2021-08-12 NOTE — Telephone Encounter (Signed)
Spoke with Benjamin Powell regarding VO approval.  ?

## 2021-08-12 NOTE — Telephone Encounter (Signed)
Yes okay 

## 2021-08-20 ENCOUNTER — Ambulatory Visit (INDEPENDENT_AMBULATORY_CARE_PROVIDER_SITE_OTHER): Payer: Medicare HMO | Admitting: Family Medicine

## 2021-08-20 ENCOUNTER — Encounter: Payer: Self-pay | Admitting: Family Medicine

## 2021-08-20 VITALS — HR 80 | Temp 97.7°F | Ht 71.75 in | Wt 206.2 lb

## 2021-08-20 DIAGNOSIS — R634 Abnormal weight loss: Secondary | ICD-10-CM | POA: Diagnosis not present

## 2021-08-20 DIAGNOSIS — K5909 Other constipation: Secondary | ICD-10-CM

## 2021-08-20 DIAGNOSIS — F321 Major depressive disorder, single episode, moderate: Secondary | ICD-10-CM | POA: Diagnosis not present

## 2021-08-20 MED ORDER — SERTRALINE HCL 100 MG PO TABS: 100.0000 mg | ORAL_TABLET | Freq: Every day | ORAL | 0 refills | Status: DC

## 2021-08-20 NOTE — Progress Notes (Signed)
OFFICE VISIT ? ?08/20/2021 ? ?CC:  ?Chief Complaint  ?Patient presents with  ? Weight Loss  ? ? ?HPI:   ? ?Patient is a 76 y.o. male who presents accompanied by his wife for evaluation of weight loss. ?I last saw him 2 weeks ago for his establish care visit. ?A/P as of that visit: ?"1.  Labile hypertension, history of orthostatic hypotension. ?Stable currently on lisinopril 20 mg a day. ?Blood pressure low here today but he is asymptomatic. ?Electrolytes and creatinine today. ? ?2 chronic renal insufficiency stage III. ?Avoid NSAIDs.  Hydrate adequately. ?Electrolytes and creatinine today. ?  ?#3 abnormal weight loss. ?Unknown etiology. ?Patient reports adequate/unchanged caloric intake. ?Checking CBC, c-Met, lipase, home Hemoccults x3 and UA today. ?  ?#4 Health maintenance exam: ?Reviewed age and gender appropriate health maintenance issues (prudent diet, regular exercise, health risks of tobacco and excessive alcohol, use of seatbelts, fire alarms in home, use of sunscreen).  Also reviewed age and gender appropriate health screening as well as vaccine recommendations. ?Vaccines: UTD ?Labs: CBC, c-Met.   ?Prostate ca screening: No further screening due to age.  However, upon patient return in 2 weeks we will check PSA due to history of weight loss. ?Colon ca screening: recall 03/2022 ?  ?#5 type 2 diabetes. ?Continue Toujeo 26 units a day and Humalog 10 units 2-3 times a day with meals and Ozempic 0.5 mg weekly ?Continue follow-up with Dr. Francetta Found. ?  ?#6: History of DVT as well as PAF. ?Continue Eliquis 5 mg twice daily as well as appropriate follow-up with his vascular doctor Dr. Vincente Poli. ?CBC today. ?  ?#7 cerebrovascular disease. ?History of CVA without residual deficit. ?Continue aspirin 81 mg a day." ? ?INTERIM HX: ?Kwane says he is feeling well. ?Labs last visit all good.  Hemoccults not turned in yet. ? ?Chaze says he has a good appetite and eats well, 2 meals a day, some snacks in between, some  sweet tea. ?His wife says that when she is home and constantly remind him to eat he will eat, but when she is not at home he finds it too much trouble to go make himself something to eat.  He admits this and agrees that his weight loss over the last 6 months has been due to this decrease caloric intake. ?He has had depression since the death of his son last year, sertraline 50 mg/day has not helped. ? ?He is only partially compliant with his insulin, wife says only when she strongly urged him to take it. ? ?He has struggled for a long time with infrequent and hard stools.  To make things worse, every couple weeks he has a urgent, loose bowel movement that he cannot hold in.  At 1 point was taking a herbal constipation medication that his daughter gave him and seems to think it was helping.  Not clear why he is not take anymore. ?He does have some intermittent hemorrhoidal bleeding. ? ?ROS as above, plus--> no fevers, no CP, no SOB, no wheezing, no cough, no dizziness, no HAs, no rashes, no melena/hematochezia.  No polyuria or polydipsia.  No myalgias or arthralgias.  No focal weakness, paresthesias, or tremors.  No acute vision or hearing abnormalities.  No dysuria or unusual/new urinary urgency or frequency.  No recent changes in lower legs. ?No n/v/d or abd pain.  No palpitations.   ? ?Past Medical History:  ?Diagnosis Date  ? Anxiety and depression   ? BPH without obstruction/lower urinary tract symptoms   ?  Cervical spondylosis   ? Surgery 2021  ? Chronic renal insufficiency, stage 3 (moderate) (HCC)   ? Frequent falls   ? GERD (gastroesophageal reflux disease)   ? Hemorrhoids   ? History of adenomatous polyp of colon   ? History of CVA (cerebrovascular accident) 10/2016  ? CTA head and neck with no occlusive dz. +advanced chronic ischemic dz changes on MR  ? History of DVT (deep vein thrombosis) 2022  ? L leg.  Hypercoag w/u NEG.  ? Hypertension   ? Treatment resistant  ? Impaired mobility and ADLs   ? OSA on  CPAP   ? PAF (paroxysmal atrial fibrillation) ([place])   ? Recurrent nephrolithiasis   ? Type 2 diabetes with nephropathy ([place])   ? endocrinologist  ? Urge incontinence   ? ? ?Past Surgical History:  ?Procedure Laterality Date  ? [place]  ? left  ? ANTERIOR CERVICAL DECOMP/DISCECTOMY FUSION N/A 10/02/2019  ? Procedure: ANTERIOR CERVICAL DECOMPRESSION FUSION CERVICAL THREE-FOUR WITH INSTRUMENTATION AND ALLOGRAFT;  Surgeon: Phylliss Bob, MD;  Location: [place];  Service: Orthopedics;  Laterality: N/A;  ? CATARACT EXTRACTION Right   ? CHOLECYSTECTOMY  1990  ? COLONOSCOPY    ? 2013 multiple hyperplastic polyps. 04/08/19 Many hyperplastic/inflammatory polyps, adenoma x1. Recall nov 2023  ? ERCP  1990  ? Hammer Toe Repair Right 09/16/2016  ? RT #5  ? Partial Excision Phalanx Left 09/16/2016  ? LT#5  ? rib fixation  05/2021  ? TONSILLECTOMY AND ADENOIDECTOMY  1998  ? and sinus for sleep apnea  ? TRANSTHORACIC ECHOCARDIOGRAM  07/07/2017  ? EF 55-60%, mild MR, no shunt  ? ? ?Outpatient Medications Prior to Visit  ?Medication Sig Dispense Refill  ? AMBULATORY NON FORMULARY MEDICATION Medication Name: Aloe Gold probiotic daily    ? apixaban (ELIQUIS) 5 MG TABS tablet Take 5 mg by mouth 2 (two) times daily.    ? ascorbic acid (VITAMIN C) 1000 MG tablet Take 1 tablet by mouth daily.    ? aspirin 81 MG EC tablet Take 1 tablet by mouth daily.    ? BD PEN NEEDLE NANO 2ND GEN 32G X 4 MM MISC 4 (four) times daily. as directed    ? Cholecalciferol (VITAMIN D-1000 MAX ST) 1000 units tablet Take 1 tablet (1,000 Units total) by mouth daily. 90 tablet 3  ? Cholecalciferol 125 MCG (5000 UT) TABS Take by mouth.    ? Flaxseed Oil (LINSEED OIL) OIL Take 1 capsule by mouth daily.    ? HUMALOG KWIKPEN 100 UNIT/ML KwikPen SMARTSIG:10 Unit(s) SUB-Q    ? insulin glargine, 2 Unit Dial, (TOUJEO MAX SOLOSTAR) 300 UNIT/ML Solostar Pen Inject into the skin.    ? lisinopril (ZESTRIL) 20 MG tablet Take 20 mg by mouth daily.    ? metoprolol  succinate (TOPROL-XL) 50 MG 24 hr tablet Take 50 mg by mouth daily.    ? Omega-3 Fatty Acids (FISH OIL) 1000 MG CAPS Take 2 capsules by mouth daily.    ? zinc gluconate 50 MG tablet Take 1 tablet by mouth daily.    ? sertraline (ZOLOFT) 50 MG tablet Take 1 tablet by mouth daily.    ? HAWTHORN BERRY PO Take by mouth daily. 4:1 extract (Patient not taking: Reported on 08/20/2021)    ? insulin aspart (NOVOLOG) 100 UNIT/ML FlexPen INJECT INTO THE SKIN 10 UNITS WITH MEALS- EATS LUNCH AND DINNER. (Patient not taking: Reported on 08/20/2021)    ? ?No facility-administered medications prior to visit.  ? ? ?  Allergies  ?Allergen Reactions  ? Amlodipine Other (See Comments)  ?  Gingival hyperplasia  ? ? ?ROS ?As per HPI ? ?PE: ? ?  08/20/2021  ?  1:53 PM 08/06/2021  ?  2:42 PM 04/08/2021  ?  2:27 PM  ?Vitals with BMI  ?Height 5' 11.75" 5' 11.75" _0   ?Weight 206 lbs 3 oz 205 lbs 13 oz 220 lbs  ?BMI 28.17 28.12 35.53  ?Systolic  93 300  ?Diastolic  61 88  ?Pulse 80 70 68  ? ? ? ?Physical Exam ? ?Gen: Alert, well appearing.  Patient is oriented to person, place, time, and situation. ?AFFECT: pleasant, lucid thought and speech. ?No further exam today. ? ?LABS:  ?Last CBC ?Lab Results  ?Component Value Date  ? WBC 8.8 08/06/2021  ? HGB 16.0 08/06/2021  ? HCT 47.4 08/06/2021  ? MCV 92.2 08/06/2021  ? MCH 31.1 08/06/2021  ? RDW 12.6 08/06/2021  ? PLT 211 08/06/2021  ? ?Last metabolic panel ?Lab Results  ?Component Value Date  ? GLUCOSE 181 (H) 08/06/2021  ? NA 140 08/06/2021  ? K 4.5 08/06/2021  ? CL 104 08/06/2021  ? CO2 24 08/06/2021  ? BUN 23 08/06/2021  ? CREATININE 1.32 (H) 08/06/2021  ? GFRNONAA 38 (L) 09/27/2019  ? CALCIUM 9.9 08/06/2021  ? PROT 6.6 08/06/2021  ? ALBUMIN 4.0 09/27/2019  ? BILITOT 0.4 08/06/2021  ? ALKPHOS 69 09/27/2019  ? AST 14 08/06/2021  ? ALT 33 08/06/2021  ? ANIONGAP 12 09/27/2019  ? ?Last lipids ?Lab Results  ?Component Value Date  ? CHOL 99 09/22/2017  ? HDL 36 (L) 09/22/2017  ? [place] 41 09/22/2017  ?  TRIG 139 09/22/2017  ? CHOLHDL 2.8 09/22/2017  ? ?Last hemoglobin A1c ?Lab Results  ?Component Value Date  ? HGBA1C 10.9 05/13/2021  ? ?Last thyroid functions ?Lab Results  ?Component Value Date  ? TSH 1.43 0

## 2021-08-20 NOTE — Patient Instructions (Signed)
Buy generic over the counter senakot ("senna) and take 2 tabs EVERY DAY to help move your bowels regularly. ? ?I have sent in prescription for '100mg'$  sertraline to take every day. ?

## 2021-08-30 ENCOUNTER — Telehealth: Payer: Self-pay

## 2021-08-30 DIAGNOSIS — R0989 Other specified symptoms and signs involving the circulatory and respiratory systems: Secondary | ICD-10-CM

## 2021-08-30 DIAGNOSIS — I480 Paroxysmal atrial fibrillation: Secondary | ICD-10-CM

## 2021-08-30 NOTE — Telephone Encounter (Signed)
Caller: April ?Agency: [facility] ? ?Callback Number: [phone] ? ?Requesting OT/PT/Skilled Nursing/Social Work/Speech Therapy: None ?Letting provider know of recent fall; this morning approx 10:00AM; patient fell out of chair ? ?She was also asking about referrals to cardiology and time for pt to get colonoscopy. I did not see any orders made by Dr. Anitra Lauth. Patient newly established here as of 08/06/21.  Can discuss at next appt with provider ?  ?Frequency: n/a  ?

## 2021-08-31 NOTE — Telephone Encounter (Addendum)
[place] orders were signed by PCP from 3/21; ?SN 1wk3 2 PRN and OT 1wk1, and another after est care visit for PT effective 4/923 2wk4, 1wk4. Due for repeat colonoscopy November this year.  ? ?LM for April regarding cardiology referral ? ? ?

## 2021-09-01 NOTE — Telephone Encounter (Signed)
April returning Britt's call. ? ?Will discuss cardiology appt with provider at next week's appt  09/10/21 ? ?No follow up call needed at this time ? ?

## 2021-09-01 NOTE — Telephone Encounter (Signed)
Sent as FYI. 

## 2021-09-02 NOTE — Telephone Encounter (Signed)
Patient wife called back to see if Dr. Anitra Lauth would go ahead and enter referral for cardiologist. ?Wife states that patient has fallen several times, fell yesterday, declines going to ED because she said "COVID" is still out there, and doesn't want her husband to get COVID.  Their son dies from [employer]. ?I told wife that I would have clinical assistant call back to discuss everything.  Patient is on his way Neurologist appt so hopefully this will get addressed at that appt for the issues he is having. ? ?She stated his blood sugar was 324 this morning. ? ?Please call [name] at [phone] after 3:30pm ?

## 2021-09-02 NOTE — Telephone Encounter (Signed)
Patient wife called back regarding referral, I told her that Dr. Anitra Lauth has been in patient care this afternoon, and her request has not been reviewed yet.   ? ?Her response "well that means it will be tomorrow before I get a call back". ? ?I apologized to her, and she said something like " I hope he has another day". ? ? ?

## 2021-09-03 NOTE — Telephone Encounter (Signed)
Please review and advise on message for cardiology referral. ? ?

## 2021-09-03 NOTE — Telephone Encounter (Signed)
Cardiology referral ordered 

## 2021-09-03 NOTE — Addendum Note (Signed)
Addended by: Tammi Sou on: 09/03/2021 01:05 PM ? ? Modules accepted: Orders ? ?

## 2021-09-03 NOTE — Telephone Encounter (Signed)
Pt's wife advised regarding referral and made aware to inform us by his next appt on 5/5, if no call received to f/u on scheduling appt. ?

## 2021-09-06 ENCOUNTER — Encounter: Payer: Self-pay | Admitting: Internal Medicine

## 2021-09-06 ENCOUNTER — Ambulatory Visit: Payer: Medicare HMO | Admitting: Internal Medicine

## 2021-09-06 ENCOUNTER — Other Ambulatory Visit: Payer: Self-pay | Admitting: Internal Medicine

## 2021-09-06 ENCOUNTER — Ambulatory Visit (INDEPENDENT_AMBULATORY_CARE_PROVIDER_SITE_OTHER): Payer: Medicare HMO

## 2021-09-06 VITALS — BP 116/68 | HR 67 | Ht 72.0 in | Wt 212.6 lb

## 2021-09-06 DIAGNOSIS — I639 Cerebral infarction, unspecified: Secondary | ICD-10-CM

## 2021-09-06 DIAGNOSIS — I4510 Unspecified right bundle-branch block: Secondary | ICD-10-CM

## 2021-09-06 DIAGNOSIS — I480 Paroxysmal atrial fibrillation: Secondary | ICD-10-CM | POA: Diagnosis not present

## 2021-09-06 NOTE — Progress Notes (Signed)
?Cardiology Office Note:   ? ?Date:  09/06/2021  ? ?ID:  Benjamin Powell, DOB 03/02/1946, MRN [id number] ? ?PCP:  Benjamin Sou, MD ?  ?[facility] Providers ?Cardiologist:  Janina Mayo, MD    ? ?Referring MD: Benjamin Sou, MD  ? ?No chief complaint on file. ??Atrial Fibrillaion ? ?History of Present Illness:   ? ?Benjamin Powell is a 76 y.o. male with a hx of CKD Stage 3, OSA on CPAP , HTN, T2DM, CVA, hx of orthostatic hypotension, DVT on elqiuis,  fall complicated by rip fractures referral for ?afib  ? ?Saw Dr. Debara Pickett for difficult to control HTN in 2018. Prior EKGs sinus rhythm. ? ?He had MRI Jan 2023 c/f stroke. Showed schwanonoma. This was in the setting of confusion. There is no notation of atrial fibrillation. He was having falls and fractured his rib.  He had cardiac monitor done in late January with no results sent over. CTA neck showed no obstructive carotid dx. ? ?He had a CVA in the past as well. ? ?BP 116/68 mmhg ? ? ?Cardiology Studies ?TTE 05/17/2021- normal EF , no significant valve ? ?Past Medical History:  ?Diagnosis Date  ? Anxiety and depression   ? BPH without obstruction/lower urinary tract symptoms   ? Cervical spondylosis   ? Surgery 2021  ? Chronic renal insufficiency, stage 3 (moderate) (HCC)   ? Frequent falls   ? GERD (gastroesophageal reflux disease)   ? Hemorrhoids   ? History of adenomatous polyp of colon   ? History of CVA (cerebrovascular accident) 10/2016  ? CTA head and neck with no occlusive dz. +advanced chronic ischemic dz changes on MR  ? History of DVT (deep vein thrombosis) 2022  ? L leg.  Hypercoag w/u NEG.  ? Hypertension   ? Treatment resistant  ? Impaired mobility and ADLs   ? OSA on CPAP   ? PAF (paroxysmal atrial fibrillation) ([place])   ? Recurrent nephrolithiasis   ? Type 2 diabetes with nephropathy ([place])   ? endocrinologist  ? Urge incontinence   ? ? ?Past Surgical History:  ?Procedure Laterality Date  ? [place]  ? left  ? ANTERIOR CERVICAL  DECOMP/DISCECTOMY FUSION N/A 10/02/2019  ? Procedure: ANTERIOR CERVICAL DECOMPRESSION FUSION CERVICAL THREE-FOUR WITH INSTRUMENTATION AND ALLOGRAFT;  Surgeon: Phylliss Bob, MD;  Location: [facility];  Service: Orthopedics;  Laterality: N/A;  ? CATARACT EXTRACTION Right   ? CHOLECYSTECTOMY  1990  ? COLONOSCOPY    ? 2013 multiple hyperplastic polyps. 04/08/19 Many hyperplastic/inflammatory polyps, adenoma x1. Recall nov 2023  ? ERCP  1990  ? Hammer Toe Repair Right 09/16/2016  ? RT #5  ? Partial Excision Phalanx Left 09/16/2016  ? LT#5  ? rib fixation  05/2021  ? TONSILLECTOMY AND ADENOIDECTOMY  1998  ? and sinus for sleep apnea  ? TRANSTHORACIC ECHOCARDIOGRAM  07/07/2017  ? EF 55-60%, mild MR, no shunt  ? ? ?Current Medications: ?Current Meds  ?Medication Sig  ? AMBULATORY NON FORMULARY MEDICATION Medication Name: Aloe Gold probiotic daily  ? apixaban (ELIQUIS) 5 MG TABS tablet Take 5 mg by mouth 2 (two) times daily.  ? ascorbic acid (VITAMIN C) 1000 MG tablet Take 1 tablet by mouth daily.  ? aspirin 81 MG EC tablet Take 1 tablet by mouth daily.  ? BD PEN NEEDLE NANO 2ND GEN 32G X 4 MM MISC 4 (four) times daily. as directed  ? Cholecalciferol 125 MCG (5000 UT) TABS Take by  mouth.  ? HUMALOG KWIKPEN 100 UNIT/ML KwikPen SMARTSIG:10 Unit(s) SUB-Q  ? insulin glargine, 2 Unit Dial, (TOUJEO MAX SOLOSTAR) 300 UNIT/ML Solostar Pen Inject into the skin.  ? lisinopril (ZESTRIL) 20 MG tablet Take 20 mg by mouth daily.  ? metoprolol succinate (TOPROL-XL) 50 MG 24 hr tablet Take 50 mg by mouth daily.  ? Omega-3 Fatty Acids (FISH OIL) 1000 MG CAPS Take 2 capsules by mouth daily.  ? sertraline (ZOLOFT) 100 MG tablet Take 1 tablet (100 mg total) by mouth daily.  ? zinc gluconate 50 MG tablet Take 1 tablet by mouth daily.  ?  ? ?Allergies:   Amlodipine  ? ?Social History  ? ?Socioeconomic History  ? Marital status: Married  ?  Spouse name: Not on file  ? Number of children: 2  ? Years of education: Not on file  ? Highest education  level: Not on file  ?Occupational History  ? Not on file  ?Tobacco Use  ? Smoking status: Former  ?  Packs/day: 0.30  ?  Years: 50.00  ?  Pack years: 15.00  ?  Types: Cigarettes  ?  Quit date: 08/08/2019  ?  Years since quitting: 2.0  ? Smokeless tobacco: Never  ?Vaping Use  ? Vaping Use: Never used  ?Substance and Sexual Activity  ? Alcohol use: No  ? Drug use: No  ? Sexual activity: Not on file  ?Other Topics Concern  ? Not on file  ?Social History Narrative  ? Married  ? Educ: college  ? Occup:   ? 52 pack-yr tobacco, quit 2022.  ? No alc/drugs.  ? ?Social Determinants of Health  ? ?Financial Resource Strain: Not on file  ?Food Insecurity: Not on file  ?Transportation Needs: Not on file  ?Physical Activity: Not on file  ?Stress: Not on file  ?Social Connections: Not on file  ?  ? ?Family History: ?The patient's family history includes Cancer in his brother and father; Colitis in his brother and sister; Diabetes in his brother and sister; Early death in his father; Heart attack in his brother; Pancreatic cancer in his mother; Stroke in his brother and brother. There is no history of Colon cancer, Esophageal cancer, Rectal cancer, or Stomach cancer. ? ?ROS:   ?Please see the history of present illness.    ? All other systems reviewed and are negative. ? ?EKGs/Labs/Other Studies Reviewed:   ? ?The following studies were reviewed today: ? ? ?EKG:  EKG is  ordered today.  The ekg ordered today demonstrates  ? ?09/06/2021-NSR, RBBB ? ?Recent Labs: ?08/06/2021: ALT 33; BUN 23; Creat 1.32; Hemoglobin 16.0; Platelets 211; Potassium 4.5; Sodium 140; TSH 1.43  ?Recent Lipid Panel ?   ?Component Value Date/Time  ? CHOL 99 09/22/2017 0912  ? TRIG 139 09/22/2017 0912  ? HDL 36 (L) 09/22/2017 0912  ? CHOLHDL 2.8 09/22/2017 0912  ? VLDL 45 (H) 04/07/2016 1635  ? [place] 41 09/22/2017 0912  ? ? ? ?Risk Assessment/Calculations:   ? ?    ? ?Physical Exam:   ? ?VS:  ? ?Vitals:  ? 09/06/21 1413  ?BP: 116/68  ?Pulse: 67  ?SpO2: 98%   ? ? ? ?Wt Readings from Last 3 Encounters:  ?09/06/21 212 lb 9.6 oz (96.4 kg)  ?08/20/21 206 lb 3.2 oz (93.5 kg)  ?08/06/21 205 lb 12.8 oz (93.4 kg)  ?  ? ?GEN:  Well nourished, well developed in no acute distress ?HEENT: Normal ?NECK: No JVD; No carotid bruits ?LYMPHATICS: No  lymphadenopathy ?CARDIAC: RRR, no murmurs, rubs, gallops ?RESPIRATORY:  Clear to auscultation without rales, wheezing or rhonchi  ?ABDOMEN: Soft, non-tender, non-distended ?MUSCULOSKELETAL:  No edema; No deformity  ?SKIN: Warm and dry ?NEUROLOGIC:  Alert and oriented x 3 ?PSYCHIATRIC:  Normal affect  ? ?ASSESSMENT:   ? ???Atrial Fibrillation: I don't have data to support this diagnosis.Will repeat his cardiac monitor. Regardless, he can hold aspirin and eliquis 2-3 days prior to  any dental procedure and restart per recommendations ?-continue eliquis 5 mg BID ?-continue metop XL 50 mg XL ? ?Falls: symptoms associated with orthostatic hypotension. Recommendations below. His blood pressures are labile; his management is challenging. Will continue regimen below ? ?HTN: continue lisinopril 20 mg daily and metop XL 50 mg daily ? ? ? ?PLAN:   ? ?In order of problems listed above: ? ?14 day ziopatch ?Recommend hydration with electrolytes, continue compression stockings, check blood pressures ? ?   ? ? ?Medication Adjustments/Labs and Tests Ordered: ?Current medicines are reviewed at length with the patient today.  Concerns regarding medicines are outlined above.  ?Orders Placed This Encounter  ?Procedures  ? LONG TERM MONITOR (3-14 DAYS)  ? EKG 12-Lead  ? ?No orders of the defined types were placed in this encounter. ? ? ?Patient Instructions  ?Medication Instructions:  ?No Changes In Medications at this time.  ?*If you need a refill on your cardiac medications before your next appointment, please call your pharmacy* ? ?Lab Work: ?None Ordered At This Time.  ?If you have labs (blood work) drawn today and your tests are completely normal, you will  receive your results only by: ?MyChart Message (if you have MyChart) OR ?A paper copy in the mail ?If you have any lab test that is abnormal or we need to change your treatment, we will call you to review the results. ?

## 2021-09-06 NOTE — Progress Notes (Unsigned)
Enrolled for [employer] to mail a ZIO XT long term holter monitor to the patients address on file.  

## 2021-09-06 NOTE — Patient Instructions (Signed)
Medication Instructions:  ?No Changes In Medications at this time.  ?*If you need a refill on your cardiac medications before your next appointment, please call your pharmacy* ? ?Lab Work: ?None Ordered At This Time.  ?If you have labs (blood work) drawn today and your tests are completely normal, you will receive your results only by: ?MyChart Message (if you have MyChart) OR ?A paper copy in the mail ?If you have any lab test that is abnormal or we need to change your treatment, we will call you to review the results. ? ?Testing/Procedures: ? ?ZIO XT- Long Term Monitor Instructions  ? ?Your physician has requested you wear your ZIO patch monitor___14____days.  ? ?This is a single patch monitor.  [employer] supplies one patch monitor per enrollment.  Additional stickers are not available. ?  ?Please do not apply patch if you will be having a Nuclear Stress Test, Echocardiogram, Cardiac CT, MRI, or Chest Xray during the time frame you would be wearing the monitor. The patch cannot be worn during these tests.  You cannot remove and re-apply the ZIO XT patch monitor. ?  ?Your ZIO patch monitor will be sent USPS Priority mail from [facility] directly to your home address. The monitor may also be mailed to a PO BOX if home delivery is not available.   It may take 3-5 days to receive your monitor after you have been enrolled. ?  ?Once you have received you monitor, please review enclosed instructions.  Your monitor has already been registered assigning a specific monitor serial # to you. ?  ?Applying the monitor  ? ?Shave hair from upper left chest. ?  ?Hold abrader disc by orange tab.  Rub abrader in 40 strokes over left upper chest as indicated in your monitor instructions. ?  ?Clean area with 4 enclosed alcohol pads .  Use all pads to assure are is cleaned thoroughly.  Let dry.  ? ?Apply patch as indicated in monitor instructions.  Patch will be place under collarbone on left side of chest with arrow pointing  upward. ?  ?Rub patch adhesive wings for 2 minutes.Remove white label marked "1".  Remove white label marked "2".  Rub patch adhesive wings for 2 additional minutes. ?  ?While looking in a mirror, press and release button in center of patch.  A small green light will flash 3-4 times .  This will be your only indicator the monitor has been turned on. ?    ?Do not shower for the first 24 hours.  You may shower after the first 24 hours. ?  ?Press button if you feel a symptom. You will hear a small click.  Record Date, Time and Symptom in the Patient Log Book. ?  ?When you are ready to remove patch, follow instructions on last 2 pages of Patient Log Book.  Stick patch monitor onto last page of Patient Log Book. ?  ?Place Patient Log Book in [facility] box.  Use locking tab on box and tape box closed securely.  The [employer] has [employer] on it.  Please place in mailbox as soon as possible.  Your physician should have your test results approximately 7 days after the monitor has been mailed back to [facility]. ?  ?Call [facility] at [phone] if you have questions regarding your ZIO XT patch monitor.  Call them immediately if you see an orange light blinking on your monitor. ?  ?If your monitor falls off in less  than 4 days contact our Monitor department at [phone].  If your monitor becomes loose or falls off after 4 days call [employer] at [phone] for suggestions on securing your monitor.  ? ? ?Follow-Up: ?At [facility], you and your health needs are our priority.  As part of our continuing mission to provide you with exceptional heart care, we have created designated Provider Care Teams.  These Care Teams include your primary Cardiologist (physician) and Advanced Practice Providers (APPs -  Physician Assistants and Nurse Practitioners) who all work together to provide you with the care you need, when you need it. ? ?Your next appointment:   ?3 month(s) ? ?The format for  your next appointment:   ?In Person ? ?Provider:   ?Janina Mayo, MD   ? ?Other Instructions ?Recommend hydration with electrolytes, continue compression stockings and continue to monitor blood pressures.  Let us know how are they are running. If they are consistently less than 100 for the top number, try stopping the lisinopril. Will plan for heart monitor for now and let you know the results.  ?

## 2021-09-10 ENCOUNTER — Telehealth: Payer: Self-pay

## 2021-09-10 ENCOUNTER — Encounter: Payer: Self-pay | Admitting: Family Medicine

## 2021-09-10 ENCOUNTER — Ambulatory Visit (INDEPENDENT_AMBULATORY_CARE_PROVIDER_SITE_OTHER): Payer: Medicare HMO | Admitting: Family Medicine

## 2021-09-10 VITALS — BP 97/63 | HR 66 | Temp 97.7°F | Ht 72.0 in | Wt 208.0 lb

## 2021-09-10 DIAGNOSIS — F321 Major depressive disorder, single episode, moderate: Secondary | ICD-10-CM | POA: Diagnosis not present

## 2021-09-10 MED ORDER — SERTRALINE HCL 100 MG PO TABS: 100.0000 mg | ORAL_TABLET | Freq: Every day | ORAL | 0 refills | Status: DC

## 2021-09-10 MED ORDER — METOPROLOL SUCCINATE ER 50 MG PO TB24: 50.0000 mg | ORAL_TABLET | Freq: Every day | ORAL | Status: DC

## 2021-09-10 NOTE — Progress Notes (Signed)
OFFICE VISIT ? ?09/10/2021 ? ?CC: f/u MDD, constipation, wt loss ? ?Patient is a 76 y.o. male who presents for 3 wk f/u MDD, constipation, and wt loss. ?A/P as of last visit: ?"#1 weight loss. ?It seems clear that this is due to decreased caloric intake over the last 6 to 12 months. ?Patient is not motivated to eat. ?I think a lot of this is related to depression. ?Lab evaluation has been reassuring. ? ?2.  Major depressive disorder. ?Started after the death of his son due to [place] last year. ?Increase sertraline to 100 mg a day. ? ?3.  Chronic constipation, occasional overflow incontinence versus irritable bowel.  Start Senokot 2 tabs a day and he can restart the herbal constipation medication his daughter had given him ?  ?#4 history of adenomatous colon polyps. ?Due for repeat colonoscopy November this year." ? ?INTERIM HX: ?Benjamin Powell saw Dr. Phineas Inches with cardiology to further assess the question of atrial fibrillation in the past. ?Old records are unclear on this.  Dr. Harl Bowie issetting up outpatient rhythm monitoring.  She did not change any medications. ? ?He saw a GI MD with [facility] [place] [place] [facility]. ?No office note available to me at this time but the diagnosis listed is functional diarrhea. ?Patient and his wife tell me today that the gastroenterologist recommended he start MiraLAX for his constipation and he has set up a colonoscopy. ?Patient says Senokot has not helped any.  Sometimes still goes about a week without a bowel movement. ?Says his appetite is great and eats well. ? ?Unfortunately, his depression has not changed any.  Anhedonia, hopelessness, anger, poor sleep. ?He is open to seeing a counselor at this time. ?All of this started after the death of his son from a COVID illness last year. ? ? ?Past Medical History:  ?Diagnosis Date  ? Anxiety and depression   ? BPH without obstruction/lower urinary tract symptoms   ? Cervical spondylosis   ? Surgery 2021  ?  Chronic renal insufficiency, stage 3 (moderate) (HCC)   ? Frequent falls   ? GERD (gastroesophageal reflux disease)   ? Hemorrhoids   ? History of adenomatous polyp of colon   ? History of CVA (cerebrovascular accident) 10/2016  ? CTA head and neck with no occlusive dz. +advanced chronic ischemic dz changes on MR  ? History of DVT (deep vein thrombosis) 2022  ? L leg.  Hypercoag w/u NEG.  ? Hypertension   ? Treatment resistant  ? Impaired mobility and ADLs   ? OSA on CPAP   ? PAF (paroxysmal atrial fibrillation) ([place])   ? Recurrent nephrolithiasis   ? Type 2 diabetes with nephropathy ([place])   ? endocrinologist  ? Urge incontinence   ? ? ?Past Surgical History:  ?Procedure Laterality Date  ? [place]  ? left  ? ANTERIOR CERVICAL DECOMP/DISCECTOMY FUSION N/A 10/02/2019  ? Procedure: ANTERIOR CERVICAL DECOMPRESSION FUSION CERVICAL THREE-FOUR WITH INSTRUMENTATION AND ALLOGRAFT;  Surgeon: Phylliss Bob, MD;  Location: [facility];  Service: Orthopedics;  Laterality: N/A;  ? CATARACT EXTRACTION Right   ? CHOLECYSTECTOMY  1990  ? COLONOSCOPY    ? 2013 multiple hyperplastic polyps. 04/08/19 Many hyperplastic/inflammatory polyps, adenoma x1. Recall nov 2023  ? ERCP  1990  ? Hammer Toe Repair Right 09/16/2016  ? RT #5  ? Partial Excision Phalanx Left 09/16/2016  ? LT#5  ? rib fixation  05/2021  ? TONSILLECTOMY AND ADENOIDECTOMY  1998  ? and sinus for  sleep apnea  ? TRANSTHORACIC ECHOCARDIOGRAM  07/07/2017  ? 07/2017 EF 55-60%, mild MR, no shunt. 05/2021 EF normal, valves nl  ? ? ?Outpatient Medications Prior to Visit  ?Medication Sig Dispense Refill  ? AMBULATORY NON FORMULARY MEDICATION Medication Name: Aloe Gold probiotic daily    ? apixaban (ELIQUIS) 5 MG TABS tablet Take 5 mg by mouth 2 (two) times daily.    ? ascorbic acid (VITAMIN C) 1000 MG tablet Take 1 tablet by mouth daily.    ? aspirin 81 MG EC tablet Take 1 tablet by mouth daily.    ? BD PEN NEEDLE NANO 2ND GEN 32G X 4 MM MISC 4 (four) times daily. as directed     ? Cholecalciferol 125 MCG (5000 UT) TABS Take by mouth.    ? HUMALOG KWIKPEN 100 UNIT/ML KwikPen SMARTSIG:10 Unit(s) SUB-Q    ? insulin glargine, 2 Unit Dial, (TOUJEO MAX SOLOSTAR) 300 UNIT/ML Solostar Pen Inject into the skin.    ? lisinopril (ZESTRIL) 20 MG tablet Take 20 mg by mouth daily.    ? metoprolol succinate (TOPROL-XL) 50 MG 24 hr tablet Take 50 mg by mouth daily. (Patient not taking: Reported on 09/10/2021)    ? Omega-3 Fatty Acids (FISH OIL) 1000 MG CAPS Take 2 capsules by mouth daily. (Patient not taking: Reported on 09/10/2021)    ? sertraline (ZOLOFT) 100 MG tablet Take 1 tablet (100 mg total) by mouth daily. 30 tablet 0  ? zinc gluconate 50 MG tablet Take 1 tablet by mouth daily. (Patient not taking: Reported on 09/10/2021)    ? ?No facility-administered medications prior to visit.  ? ? ?Allergies  ?Allergen Reactions  ? Amlodipine Other (See Comments)  ?  Gingival hyperplasia  ? ? ?ROS ?As per HPI ? ?PE: ? ?  09/10/2021  ?  1:06 PM 09/06/2021  ?  2:13 PM 08/20/2021  ?  1:53 PM  ?Vitals with BMI  ?Height '6\' 0"'$  '6\' 0"'$  5' 11.75"  ?Weight 208 lbs 212 lbs 10 oz 206 lbs 3 oz  ?BMI 28.2 28.83 28.17  ?Systolic 97 338   ?Diastolic 63 68   ?Pulse 66 67 80  ? ?Physical Exam ? ?Normal: Alert and well-appearing. ?Affect is pleasant, lucid thought and speech. ?No further exam today ? ?LABS:  ?Last CBC ?Lab Results  ?Component Value Date  ? WBC 8.8 08/06/2021  ? HGB 16.0 08/06/2021  ? HCT 47.4 08/06/2021  ? MCV 92.2 08/06/2021  ? MCH 31.1 08/06/2021  ? RDW 12.6 08/06/2021  ? PLT 211 08/06/2021  ? ?Last metabolic panel ?Lab Results  ?Component Value Date  ? GLUCOSE 181 (H) 08/06/2021  ? NA 140 08/06/2021  ? K 4.5 08/06/2021  ? CL 104 08/06/2021  ? CO2 24 08/06/2021  ? BUN 23 08/06/2021  ? CREATININE 1.32 (H) 08/06/2021  ? GFRNONAA 38 (L) 09/27/2019  ? CALCIUM 9.9 08/06/2021  ? PROT 6.6 08/06/2021  ? ALBUMIN 4.0 09/27/2019  ? BILITOT 0.4 08/06/2021  ? ALKPHOS 69 09/27/2019  ? AST 14 08/06/2021  ? ALT 33 08/06/2021  ?  ANIONGAP 12 09/27/2019  ? ?Last lipids ?Lab Results  ?Component Value Date  ? CHOL 99 09/22/2017  ? HDL 36 (L) 09/22/2017  ? [place] 41 09/22/2017  ? TRIG 139 09/22/2017  ? CHOLHDL 2.8 09/22/2017  ? ?Last hemoglobin A1c ?Lab Results  ?Component Value Date  ? HGBA1C 10.9 05/13/2021  ? ?Last thyroid functions ?Lab Results  ?Component Value Date  ? TSH 1.43 08/06/2021  ? ?  IMPRESSION AND PLAN: ? ?#1 major depressive disorder. ?Maximize sertraline to 200 mg a day. ?Recheck 3 weeks. ?Behavioral health counselor referral ordered today. ? ?#2 constipation. ?Senokot not much help. ?As per GI, patient to start MiraLAX today. ?He has a colonoscopy coming up. ? ?#3 weight loss. ?Patient seems to be eating well since last visit with me 3 weeks ago he has dropped 4 more pounds. ?Lab evaluation has been reassuring. ?Colonoscopy coming up. ? ?#4 diabetes type 2, not well controlled.Marland Kitchen ?He is working with his endocrinologist  ? Unfortunately he cannot get Ozempic at this time due to a shortage. ? ?An After Visit Summary was printed and given to the patient. ? ?FOLLOW UP: Return for 3-4 wk f/u MDD. ? ?Signed:  Crissie Sickles, MD           09/10/2021 ? ?

## 2021-09-10 NOTE — Telephone Encounter (Signed)
Noted.  Discussed at office visit today. ?

## 2021-09-10 NOTE — Patient Instructions (Signed)
Ask your insurer about coverage for victoza, byetta,trulicity, or moujaro. ?These are possible substitutes for ozempic- that you can talk to your endocrinologist about until ozempic is available again. ?
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
## 2021-09-13 ENCOUNTER — Telehealth: Payer: Self-pay | Admitting: Internal Medicine

## 2021-09-13 NOTE — Telephone Encounter (Signed)
Wife calling back to say that patient bp is running high. Calling to see what she would need to do with the medication. Please advise ?

## 2021-09-13 NOTE — Telephone Encounter (Signed)
Contacted patient, she states that her husbands blood pressures have been running higher at night, but then they will bottom out. She is not sure what she should do. He is currently wearing his heart monitor as well. ? ?On Friday they went to PCP (note in epic) BP was 97/63 HR 66.  ? ?BP log taken since Friday (5/5)- @ 2:00 PM 97/63, @ 11:00 PM 115/72 (held Lisinopril) ?Saturday (5/6)- @ 10:30 PM 152/92 HR 67 (only gave 1/2 Lisinopril)  ?Sunday (5/7)- '@10'$ :00 PM 155/94 HR 68 (only gave 1/2 Lisinopril)  ?Monday (5/8)- just now 111/88 HR 65 ? ?Patient states the only symptoms is dizziness when it gets higher, she is not sure why it is going up, but she states she is afraid to give the whole lisinopril because it bottoms him out and she is afraid to let him go to sleep when this happens.  ? ? ?

## 2021-09-13 NOTE — Telephone Encounter (Signed)
Called patient, advised of message from MD below. ?Patient verbalized understanding. ? ?

## 2021-09-15 ENCOUNTER — Telehealth: Payer: Self-pay

## 2021-09-15 NOTE — Telephone Encounter (Signed)
Einar Gip from [facility] home [facility]. Wife states she called endo due to high CBG. Nurse just wanted to call to inform PCP the he has been running high blood sugars. ?

## 2021-09-16 ENCOUNTER — Other Ambulatory Visit: Payer: Self-pay | Admitting: Family Medicine

## 2021-09-16 ENCOUNTER — Telehealth: Payer: Self-pay

## 2021-09-16 NOTE — Telephone Encounter (Signed)
Pls call patient this afternoon to see if endo called him back. ?

## 2021-09-16 NOTE — Telephone Encounter (Signed)
April, [facility] called stating that they will be discharging pt next week unless told other wise. If needing to continue or talk with April she can be reached at [phone] ?

## 2021-09-17 NOTE — Telephone Encounter (Signed)
Yes they have gotten in contact with Endo. They do no think that he is administering his insulin correctly so they are thinking about giving him a pump to administer insulin for pt. They have put him back on ozempic. They are trying to get therapy for pt due to his shakes. He will not allow wife administer medication for him. Insurance is a problem for them because they are not wanting to pay for what is needed to care for pt. Pt's sugar is continuously between 400-500. ?

## 2021-09-17 NOTE — Telephone Encounter (Signed)
noted 

## 2021-09-27 ENCOUNTER — Telehealth: Payer: Self-pay

## 2021-09-27 NOTE — Telephone Encounter (Signed)
Please review and advise   [facility] Day - Client Nonclinical Telephone Record  [facility] Client [facility] - Client Client Site [place] - Day Provider Crissie Sickles - MD Contact Type Call Who Is [place] / [place] Name [facility] Caller Phone Number Declined to provide Patient Name Benjamin Powell Patient DOB [birth year] Facility Number [phone] Facility Name [facility] [place] Message Only Reason for Call Request to speak to Physician Initial Comment Caller states she called on the 5th for a order for Home health Occupational Therapy for evaluation and treat for patient. Disp. Time Disposition Final User 09/21/2021 5:05:53 PM General Information Provided Yes Ignatowski, Tiffany Call Closed By: Gaye Pollack Transaction Date/Time: 09/21/2021 5:02:27 PM (ET)

## 2021-09-27 NOTE — Telephone Encounter (Addendum)
Lauren called requesting VO for OT eval 1/1wk  Please call for approval or if you have any other questions.  [phone] secured

## 2021-09-27 NOTE — Telephone Encounter (Signed)
Yes, okay.

## 2021-09-28 ENCOUNTER — Telehealth: Payer: Self-pay | Admitting: Family Medicine

## 2021-09-28 NOTE — Telephone Encounter (Signed)
LM on secure VM regarding VO.

## 2021-09-28 NOTE — Telephone Encounter (Signed)
Attempted to schedule AWV. Unable to LVM.  Will try at later time.    Patient mailbox full  

## 2021-10-01 ENCOUNTER — Telehealth: Payer: Self-pay | Admitting: Family Medicine

## 2021-10-01 NOTE — Telephone Encounter (Signed)
Yes okay 

## 2021-10-01 NOTE — Telephone Encounter (Signed)
Okay for orders? 

## 2021-10-01 NOTE — Telephone Encounter (Signed)
[place] Name: Benjamin Powell w/[facility] Agency Name: [phone] Service Requested: OT (examples: OT/PT/Skilled Nursing/Social Work/Speech Therapy/Wound Care) Frequency of Visits: requesting VO for OT to recertify pt, 1x wk for 4 wks for home exercise program

## 2021-10-05 NOTE — Telephone Encounter (Signed)
Spoke with Timmothy Sours and approved VO given on secure VM.

## 2021-10-07 NOTE — Telephone Encounter (Signed)
Pt is being d/c'd from physical therapy

## 2021-10-08 ENCOUNTER — Encounter: Payer: Self-pay | Admitting: Family Medicine

## 2021-10-08 ENCOUNTER — Ambulatory Visit (INDEPENDENT_AMBULATORY_CARE_PROVIDER_SITE_OTHER): Payer: Medicare HMO | Admitting: Family Medicine

## 2021-10-08 VITALS — BP 100/70 | HR 71 | Temp 97.4°F | Wt 208.4 lb

## 2021-10-08 DIAGNOSIS — R251 Tremor, unspecified: Secondary | ICD-10-CM | POA: Diagnosis not present

## 2021-10-08 DIAGNOSIS — I952 Hypotension due to drugs: Secondary | ICD-10-CM | POA: Diagnosis not present

## 2021-10-08 DIAGNOSIS — F331 Major depressive disorder, recurrent, moderate: Secondary | ICD-10-CM | POA: Diagnosis not present

## 2021-10-08 DIAGNOSIS — I10 Essential (primary) hypertension: Secondary | ICD-10-CM | POA: Diagnosis not present

## 2021-10-08 NOTE — Progress Notes (Signed)
OFFICE VISIT  10/08/2021  CC: f/u depression  HPI:    Patient is a 76 y.o. male who presents accompanied by his wife for 1 month follow-up major depressive disorder. A/P as of last visit: "#1 major depressive disorder. Maximize sertraline to 200 mg a day. Recheck 3 weeks. Behavioral health counselor referral ordered today.   #2 constipation. Senokot not much help. As per GI, patient to start MiraLAX today. He has a colonoscopy coming up.   #3 weight loss. Patient seems to be eating well since last visit with me 3 weeks ago he has dropped 4 more pounds. Lab evaluation has been reassuring. Colonoscopy coming up.   #4 diabetes type 2, not well controlled.Marland Kitchen He is working with his endocrinologist   Unfortunately he cannot get Ozempic at this time due to a shortage."  INTERIM HX: Mood is significantly improved. Tolerating 200 mg sertraline well. He does feel like getting out and doing more. Says he would like to go fishing!  Still having less loose stools, says gastroenterology has postponed his colonoscopy because they want to evaluate a stool sample to rule out infection.  Of note, he is back on Ozempic, working hard with his endocrinologist to get sugars down, last couple of days has even noted sugars getting down in the low 100s.  Home blood pressures have been in the 13-086 systolic range.  He denies dizziness or fatigue.  Past Medical History:  Diagnosis Date   Anxiety and depression    BPH without obstruction/lower urinary tract symptoms    Cervical spondylosis    Surgery 2021   Chronic renal insufficiency, stage 3 (moderate) (HCC)    Frequent falls    GERD (gastroesophageal reflux disease)    Hemorrhoids    History of adenomatous polyp of colon    History of CVA (cerebrovascular accident) 10/2016   CTA head and neck with no occlusive dz. +advanced chronic ischemic dz changes on MR   History of DVT (deep vein thrombosis) 2022   L leg.  Hypercoag w/u NEG.    Hypertension    Treatment resistant   Impaired mobility and ADLs    OSA on CPAP    PAF (paroxysmal atrial fibrillation) (HCC)    Recurrent nephrolithiasis    Type 2 diabetes with nephropathy [facility])    endocrinologist   Urge incontinence     Past Surgical History:  Procedure Laterality Date   ANKLE SURGERY  1993   left   ANTERIOR CERVICAL DECOMP/DISCECTOMY FUSION N/A 10/02/2019   Procedure: ANTERIOR CERVICAL DECOMPRESSION FUSION CERVICAL THREE-FOUR WITH INSTRUMENTATION AND ALLOGRAFT;  Surgeon: Phylliss Bob, MD;  Location: [facility];  Service: Orthopedics;  Laterality: N/A;   CATARACT EXTRACTION Right    CHOLECYSTECTOMY  1990   COLONOSCOPY     2013 multiple hyperplastic polyps. 04/08/19 Many hyperplastic/inflammatory polyps, adenoma x1. Recall nov 2023   ERCP  1990   Hammer Toe Repair Right 09/16/2016   RT #5   Partial Excision Phalanx Left 09/16/2016   LT#5   rib fixation  05/2021   TONSILLECTOMY AND ADENOIDECTOMY  1998   and sinus for sleep apnea   TRANSTHORACIC ECHOCARDIOGRAM  07/07/2017   07/2017 EF 55-60%, mild MR, no shunt. 05/2021 EF normal, valves nl    Outpatient Medications Prior to Visit  Medication Sig Dispense Refill   AMBULATORY NON FORMULARY MEDICATION Medication Name: Aloe Gold probiotic daily     apixaban (ELIQUIS) 5 MG TABS tablet Take 5 mg by mouth 2 (two) times daily.  aspirin 81 MG EC tablet Take 1 tablet by mouth daily.     BD PEN NEEDLE NANO 2ND GEN 32G X 4 MM MISC 4 (four) times daily. as directed     Cholecalciferol 125 MCG (5000 UT) TABS Take by mouth.     Continuous Blood Gluc Sensor (DEXCOM G6 SENSOR) MISC by Does not apply route.     HUMALOG KWIKPEN 100 UNIT/ML KwikPen SMARTSIG:10 Unit(s) SUB-Q     insulin glargine, 2 Unit Dial, (TOUJEO MAX SOLOSTAR) 300 UNIT/ML Solostar Pen Inject into the skin.     lisinopril (ZESTRIL) 20 MG tablet Take 20 mg by mouth daily.     metoprolol succinate (TOPROL-XL) 50 MG 24 hr tablet Take 1 tablet (50 mg total) by  mouth daily. Take 50 mg by mouth daily.     OZEMPIC, 0.25 OR 0.5 MG/DOSE, 2 MG/3ML SOPN Inject into the skin.     sertraline (ZOLOFT) 100 MG tablet Take 1 tablet (100 mg total) by mouth daily. 60 tablet 0   ascorbic acid (VITAMIN C) 1000 MG tablet Take 1 tablet by mouth daily. (Patient not taking: Reported on 10/08/2021)     No facility-administered medications prior to visit.    Allergies  Allergen Reactions   Amlodipine Other (See Comments)    Gingival hyperplasia    ROS As per HPI  PE:    10/08/2021    1:03 PM 09/10/2021    1:06 PM 09/06/2021    2:13 PM  Vitals with BMI  Height  '6\' 0"'$  '6\' 0"'$   Weight 208 lbs 6 oz 208 lbs 212 lbs 10 oz  BMI 28.26 70.6 23.76  Systolic 283 97 151  Diastolic 70 63 68  Pulse 71 66 67  02 sat 93% RA  Physical Exam  Gen: Alert, well appearing.  Patient is oriented to person, place, time, and situation. AFFECT: pleasant, lucid thought and speech. CV: RRR, no m/r/g.   LUNGS: CTA bilat, nonlabored resps, good aeration in all lung fields. Mild intermittent hand tremor bilaterally.  Question cogwheeling bilateral wrists.  When he ambulates he does not shuffle but he takes short steps.  LABS:  Last CBC Lab Results  Component Value Date   WBC 8.8 08/06/2021   HGB 16.0 08/06/2021   HCT 47.4 08/06/2021   MCV 92.2 08/06/2021   MCH 31.1 08/06/2021   RDW 12.6 08/06/2021   PLT 211 76/16/0737   Last metabolic panel Lab Results  Component Value Date   GLUCOSE 181 (H) 08/06/2021   NA 140 08/06/2021   K 4.5 08/06/2021   CL 104 08/06/2021   CO2 24 08/06/2021   BUN 23 08/06/2021   CREATININE 1.32 (H) 08/06/2021   GFRNONAA 38 (L) 09/27/2019   CALCIUM 9.9 08/06/2021   PROT 6.6 08/06/2021   ALBUMIN 4.0 09/27/2019   BILITOT 0.4 08/06/2021   ALKPHOS 69 09/27/2019   AST 14 08/06/2021   ALT 33 08/06/2021   ANIONGAP 12 09/27/2019   Last lipids Lab Results  Component Value Date   CHOL 99 09/22/2017   HDL 36 (L) 09/22/2017   LDLCALC 41 09/22/2017    TRIG 139 09/22/2017   CHOLHDL 2.8 09/22/2017   Last hemoglobin A1c Lab Results  Component Value Date   HGBA1C 10.9 05/13/2021   Last thyroid functions Lab Results  Component Value Date   TSH 1.43 08/06/2021   IMPRESSION AND PLAN:  #1 major depressive disorder, improved on sertraline 200 mg. Hopefully will continue to improve to get complete remission over the  next 6 to 8 weeks.  #2 constipation alternating with diarrhea. Hard for him to find the right balance of laxative use. Colonoscopy coming up soon. GI has obtained a stool sample for further evaluation.  #3 hypertension.  His blood pressure is actually on the low side but he is asymptomatic. Cardiology is following him and has recommended that he stay on the same medicines--> lisinopril 20 mg/day and Toprol-XL 50 mg/day.  #4 type 2 diabetes.  Working with endocrinologist.  Glucose control improving.  #5 hand tremor.  Patient and wife report that he has a neurologist who is following this.  An After Visit Summary was printed and given to the patient.  FOLLOW UP: Return in about 6 weeks (around 11/19/2021) for f/u depression.  Signed:  Crissie Sickles, MD           10/08/2021

## 2021-10-09 ENCOUNTER — Telehealth: Payer: Self-pay

## 2021-10-09 ENCOUNTER — Ambulatory Visit: Payer: Medicare HMO

## 2021-10-09 NOTE — Progress Notes (Deleted)
Subjective:   Benjamin Powell is a 76 y.o. male who presents for an Initial Medicare Annual Wellness Visit.  Review of Systems    ***       Objective:    There were no vitals filed for this visit. There is no height or weight on file to calculate BMI.     10/03/2019    8:00 AM 10/02/2019    8:57 AM 05/22/2015    9:15 PM 02/07/2014    7:53 PM 10/11/2013    2:51 PM  Advanced Directives  Does Patient Have a Medical Advance Directive? No No No No Patient does not have advance directive;Patient would not like information  Would patient like information on creating a medical advance directive? No - Patient declined  No - patient declined information      Current Medications (verified) Outpatient Encounter Medications as of 10/09/2021  Medication Sig   AMBULATORY NON FORMULARY MEDICATION Medication Name: Aloe Gold probiotic daily   apixaban (ELIQUIS) 5 MG TABS tablet Take 5 mg by mouth 2 (two) times daily.   ascorbic acid (VITAMIN C) 1000 MG tablet Take 1 tablet by mouth daily. (Patient not taking: Reported on 10/08/2021)   aspirin 81 MG EC tablet Take 1 tablet by mouth daily.   BD PEN NEEDLE NANO 2ND GEN 32G X 4 MM MISC 4 (four) times daily. as directed   Cholecalciferol 125 MCG (5000 UT) TABS Take by mouth.   Continuous Blood Gluc Sensor (DEXCOM G6 SENSOR) MISC by Does not apply route.   HUMALOG KWIKPEN 100 UNIT/ML KwikPen SMARTSIG:10 Unit(s) SUB-Q   insulin glargine, 2 Unit Dial, (TOUJEO MAX SOLOSTAR) 300 UNIT/ML Solostar Pen Inject into the skin.   lisinopril (ZESTRIL) 20 MG tablet Take 20 mg by mouth daily.   metoprolol succinate (TOPROL-XL) 50 MG 24 hr tablet Take 1 tablet (50 mg total) by mouth daily. Take 50 mg by mouth daily.   OZEMPIC, 0.25 OR 0.5 MG/DOSE, 2 MG/3ML SOPN Inject into the skin.   sertraline (ZOLOFT) 100 MG tablet Take 1 tablet (100 mg total) by mouth daily.   No facility-administered encounter medications on file as of 10/09/2021.    Allergies  (verified) Amlodipine   History: Past Medical History:  Diagnosis Date   Anxiety and depression    BPH without obstruction/lower urinary tract symptoms    Cervical spondylosis    Surgery 2021   Chronic renal insufficiency, stage 3 (moderate) (HCC)    Frequent falls    GERD (gastroesophageal reflux disease)    Hemorrhoids    History of adenomatous polyp of colon    History of CVA (cerebrovascular accident) 10/2016   CTA head and neck with no occlusive dz. +advanced chronic ischemic dz changes on MR   History of DVT (deep vein thrombosis) 2022   L leg.  Hypercoag w/u NEG.   Hypertension    Treatment resistant   Impaired mobility and ADLs    OSA on CPAP    PAF (paroxysmal atrial fibrillation) (HCC)    Recurrent nephrolithiasis    Type 2 diabetes with nephropathy [facility])    endocrinologist   Urge incontinence    Past Surgical History:  Procedure Laterality Date   ANKLE SURGERY  1993   left   ANTERIOR CERVICAL DECOMP/DISCECTOMY FUSION N/A 10/02/2019   Procedure: ANTERIOR CERVICAL DECOMPRESSION FUSION CERVICAL THREE-FOUR WITH INSTRUMENTATION AND ALLOGRAFT;  Surgeon: Phylliss Bob, MD;  Location: [facility];  Service: Orthopedics;  Laterality: N/A;   CATARACT EXTRACTION Right  CHOLECYSTECTOMY  1990   COLONOSCOPY     2013 multiple hyperplastic polyps. 04/08/19 Many hyperplastic/inflammatory polyps, adenoma x1. Recall nov 2023   ERCP  1990   Hammer Toe Repair Right 09/16/2016   RT #5   Partial Excision Phalanx Left 09/16/2016   LT#5   rib fixation  05/2021   TONSILLECTOMY AND ADENOIDECTOMY  1998   and sinus for sleep apnea   TRANSTHORACIC ECHOCARDIOGRAM  07/07/2017   07/2017 EF 55-60%, mild MR, no shunt. 05/2021 EF normal, valves nl   Family History  Problem Relation Age of Onset   Pancreatic cancer Mother    Cancer Father    Early death Father    Colitis Sister    Diabetes Sister    Stroke Brother    Colitis Brother    Diabetes Brother    Heart attack Brother    Cancer  Brother    Stroke Brother    Colon cancer Neg Hx    Esophageal cancer Neg Hx    Rectal cancer Neg Hx    Stomach cancer Neg Hx    Social History   Socioeconomic History   Marital status: Married    Spouse name: Not on file   Number of children: 2   Years of education: Not on file   Highest education level: Not on file  Occupational History   Not on file  Tobacco Use   Smoking status: Former    Packs/day: 0.30    Years: 50.00    Pack years: 15.00    Types: Cigarettes    Quit date: 08/08/2019    Years since quitting: 2.1   Smokeless tobacco: Never  Vaping Use   Vaping Use: Never used  Substance and Sexual Activity   Alcohol use: No   Drug use: No   Sexual activity: Not on file  Other Topics Concern   Not on file  Social History Narrative   Married   Educ: college   Occup:    61 pack-yr tobacco, quit 2022.   No alc/drugs.   Social Determinants of Health   Financial Resource Strain: Not on file  Food Insecurity: Not on file  Transportation Needs: Not on file  Physical Activity: Not on file  Stress: Not on file  Social Connections: Not on file    Tobacco Counseling Counseling given: Not Answered   Clinical Intake:                 Diabetic?***         Activities of Daily Living     View : No data to display.           Patient Care Team: Tammi Sou, MD as PCP - General (Family Medicine) Janina Mayo, MD as PCP - Cardiology (Cardiology) Silverio Decamp, MD as Consulting Physician (Sports Medicine) Ladene Artist, MD as Consulting Physician (Gastroenterology) Gerome Apley, MD as Consulting Physician (Endocrinology) [facility] Sanjuana Mae, MD as Consulting Physician (Vascular Surgery) Riki Sheer, MD as Consulting Physician (Neurosurgery) Phylliss Bob, MD as Consulting Physician (Orthopedic Surgery)  Indicate any recent Medical Services you may have received from other than [facility]  providers in the past year (date may be approximate).     Assessment:   This is a routine wellness examination for Benjamin Powell.  Hearing/Vision screen No results found.  Dietary issues and exercise activities discussed:     Goals Addressed   None   Depression Screen    09/22/2017  8:29 AM  PHQ 2/9 Scores  PHQ - 2 Score 0  PHQ- 9 Score 4    Fall Risk    08/06/2021    2:46 PM 03/10/2017    4:26 PM  [place] in the past year? 1 Yes  Number falls in past yr: 1 1  Injury with Fall? 1 No  Risk for fall due to : History of fall(s);Impaired balance/gait Other (Comment)  Follow up Falls evaluation completed     FALL RISK PREVENTION PERTAINING TO THE HOME:  Any stairs in or around the home? {YES/NO:21197} If so, are there any without handrails? {YES/NO:21197} Home free of loose throw rugs in walkways, pet beds, electrical cords, etc? {YES/NO:21197} Adequate lighting in your home to reduce risk of falls? {YES/NO:21197}  ASSISTIVE DEVICES UTILIZED TO PREVENT FALLS:  Life alert? {YES/NO:21197} Use of a cane, walker or w/c? {YES/NO:21197} Grab bars in the bathroom? {YES/NO:21197} Shower chair or bench in shower? {YES/NO:21197} Elevated toilet seat or a handicapped toilet? {YES/NO:21197}  TIMED UP AND GO:  Was the test performed? {YES/NO:21197}.  Length of time to ambulate 10 feet: *** sec.   {Appearance of WHQP:5916384}  Cognitive Function:        Immunizations Immunization History  Administered Date(s) Administered   Influenza, High Dose Seasonal PF 03/27/2017, 03/16/2018   Pneumococcal Conjugate-13 10/11/2013   Pneumococcal Polysaccharide-23 03/04/2016   Tdap 12/02/2011   Zoster, Live 01/27/2012    {TDAP status:2101805}  {Flu Vaccine status:2101806}  {Pneumococcal vaccine status:2101807}  {Covid-19 vaccine status:2101808}  Qualifies for Shingles Vaccine? {YES/NO:21197}  Zostavax completed {YES/NO:21197}  {Shingrix  Completed?:2101804}  Screening Tests Health Maintenance  Topic Date Due   Hepatitis C Screening  Never done   Zoster Vaccines- Shingrix (1 of 2) Never done   COVID-19 Vaccine (1) 10/24/2021 (Originally 04/11/1946)   HEMOGLOBIN A1C  11/10/2021   TETANUS/TDAP  12/01/2021   INFLUENZA VACCINE  12/07/2021   COLONOSCOPY (Pts 45-31yr Insurance coverage will need to be confirmed)  04/07/2022   FOOT EXAM  04/08/2022   OPHTHALMOLOGY EXAM  04/08/2022   Pneumonia Vaccine 75 Years old  Completed   HPV VACCINES  Aged Out    Health Maintenance  Health Maintenance Due  Topic Date Due   Hepatitis C Screening  Never done   Zoster Vaccines- Shingrix (1 of 2) Never done    {Colorectal cancer screening:2101809}  Lung Cancer Screening: (Low Dose CT Chest recommended if Age 75-80years, 30 pack-year currently smoking OR have quit w/in 15years.) {DOES NOT does:27190::"does not"} qualify.   Lung Cancer Screening Referral: ***  Additional Screening:  Hepatitis C Screening: {DOES NOT does:27190::"does not"} qualify; Completed ***  Vision Screening: Recommended annual ophthalmology exams for early detection of glaucoma and other disorders of the eye. Is the patient up to date with their annual eye exam?  {YES/NO:21197} Who is the provider or what is the name of the office in which the patient attends annual eye exams? *** If pt is not established with a provider, would they like to be referred to a provider to establish care? {YES/NO:21197}.   Dental Screening: Recommended annual dental exams for proper oral hygiene  Community Resource Referral / Chronic Care Management: CRR required this visit?  {YES/NO:21197}  CCM required this visit?  {YES/NO:21197}     Plan:     I have personally reviewed and noted the following in the patient's chart:   Medical and social history Use of alcohol, tobacco or illicit drugs  Current medications and  supplements including opioid prescriptions. {Opioid  Prescriptions:[phone]} Functional ability and status Nutritional status Physical activity Advanced directives List of other physicians Hospitalizations, surgeries, and ER visits in previous 12 months Vitals Screenings to include cognitive, depression, and falls Referrals and appointments  In addition, I have reviewed and discussed with patient certain preventive protocols, quality metrics, and best practice recommendations. A written personalized care plan for preventive services as well as general preventive health recommendations were provided to patient.     Octaviano Glow, CMA   10/09/2021   Nurse Notes: ***

## 2021-10-09 NOTE — Telephone Encounter (Signed)
Attempted to contact pt for AWV. LVM to call and reschedule

## 2021-10-09 NOTE — Patient Instructions (Signed)
Health Maintenance, Male Adopting a healthy lifestyle and getting preventive care are important in promoting health and wellness. Ask your health care provider about: The right schedule for you to have regular tests and exams. Things you can do on your own to prevent diseases and keep yourself healthy. What should I know about diet, weight, and exercise? Eat a healthy diet  Eat a diet that includes plenty of vegetables, fruits, low-fat dairy products, and lean protein. Do not eat a lot of foods that are high in solid fats, added sugars, or sodium. Maintain a healthy weight Body mass index (BMI) is a measurement that can be used to identify possible weight problems. It estimates body fat based on height and weight. Your health care provider can help determine your BMI and help you achieve or maintain a healthy weight. Get regular exercise Get regular exercise. This is one of the most important things you can do for your health. Most adults should: Exercise for at least 150 minutes each week. The exercise should increase your heart rate and make you sweat (moderate-intensity exercise). Do strengthening exercises at least twice a week. This is in addition to the moderate-intensity exercise. Spend less time sitting. Even light physical activity can be beneficial. Watch cholesterol and blood lipids Have your blood tested for lipids and cholesterol at 75 years of age, then have this test every 5 years. You may need to have your cholesterol levels checked more often if: Your lipid or cholesterol levels are high. You are older than 75 years of age. You are at high risk for heart disease. What should I know about cancer screening? Many types of cancers can be detected early and may often be prevented. Depending on your health history and family history, you may need to have cancer screening at various ages. This may include screening for: Colorectal cancer. Prostate cancer. Skin cancer. Lung  cancer. What should I know about heart disease, diabetes, and high blood pressure? Blood pressure and heart disease High blood pressure causes heart disease and increases the risk of stroke. This is more likely to develop in people who have high blood pressure readings or are overweight. Talk with your health care provider about your target blood pressure readings. Have your blood pressure checked: Every 3-5 years if you are 18-39 years of age. Every year if you are 40 years old or older. If you are between the ages of 65 and 75 and are a current or former smoker, ask your health care provider if you should have a one-time screening for abdominal aortic aneurysm (AAA). Diabetes Have regular diabetes screenings. This checks your fasting blood sugar level. Have the screening done: Once every three years after age 45 if you are at a normal weight and have a low risk for diabetes. More often and at a younger age if you are overweight or have a high risk for diabetes. What should I know about preventing infection? Hepatitis B If you have a higher risk for hepatitis B, you should be screened for this virus. Talk with your health care provider to find out if you are at risk for hepatitis B infection. Hepatitis C Blood testing is recommended for: Everyone born from 1945 through 1965. Anyone with known risk factors for hepatitis C. Sexually transmitted infections (STIs) You should be screened each year for STIs, including gonorrhea and chlamydia, if: You are sexually active and are younger than 75 years of age. You are older than 75 years of age and your   health care provider tells you that you are at risk for this type of infection. Your sexual activity has changed since you were last screened, and you are at increased risk for chlamydia or gonorrhea. Ask your health care provider if you are at risk. Ask your health care provider about whether you are at high risk for HIV. Your health care provider  may recommend a prescription medicine to help prevent HIV infection. If you choose to take medicine to prevent HIV, you should first get tested for HIV. You should then be tested every 3 months for as long as you are taking the medicine. Follow these instructions at home: Alcohol use Do not drink alcohol if your health care provider tells you not to drink. If you drink alcohol: Limit how much you have to 0-2 drinks a day. Know how much alcohol is in your drink. In the [place] one drink equals one 12 oz bottle of beer (355 mL), one 5 oz glass of wine (148 mL), or one 1 oz glass of hard liquor (44 mL). Lifestyle Do not use any products that contain nicotine or tobacco. These products include cigarettes, chewing tobacco, and vaping devices, such as e-cigarettes. If you need help quitting, ask your health care provider. Do not use street drugs. Do not share needles. Ask your health care provider for help if you need support or information about quitting drugs. General instructions Schedule regular health, dental, and eye exams. Stay current with your vaccines. Tell your health care provider if: You often feel depressed. You have ever been abused or do not feel safe at home. Summary Adopting a healthy lifestyle and getting preventive care are important in promoting health and wellness. Follow your health care provider's instructions about healthy diet, exercising, and getting tested or screened for diseases. Follow your health care provider's instructions on monitoring your cholesterol and blood pressure. This information is not intended to replace advice given to you by your health care provider. Make sure you discuss any questions you have with your health care provider. Document Revised: 09/14/2020 Document Reviewed: 09/14/2020 Elsevier Patient Education  2023 [employer] Inc[employer]  

## 2021-10-13 DIAGNOSIS — M47812 Spondylosis without myelopathy or radiculopathy, cervical region: Secondary | ICD-10-CM

## 2021-10-13 DIAGNOSIS — E1121 Type 2 diabetes mellitus with diabetic nephropathy: Secondary | ICD-10-CM

## 2021-10-13 DIAGNOSIS — I4891 Unspecified atrial fibrillation: Secondary | ICD-10-CM

## 2021-10-13 DIAGNOSIS — Z794 Long term (current) use of insulin: Secondary | ICD-10-CM

## 2021-10-13 DIAGNOSIS — M5030 Other cervical disc degeneration, unspecified cervical region: Secondary | ICD-10-CM

## 2021-10-13 DIAGNOSIS — M5136 Other intervertebral disc degeneration, lumbar region: Secondary | ICD-10-CM

## 2021-10-13 DIAGNOSIS — I152 Hypertension secondary to endocrine disorders: Secondary | ICD-10-CM

## 2021-10-13 DIAGNOSIS — Z7985 Long-term (current) use of injectable non-insulin antidiabetic drugs: Secondary | ICD-10-CM

## 2021-10-13 DIAGNOSIS — G4733 Obstructive sleep apnea (adult) (pediatric): Secondary | ICD-10-CM

## 2021-10-13 DIAGNOSIS — Z9181 History of falling: Secondary | ICD-10-CM

## 2021-10-13 DIAGNOSIS — S2241XD Multiple fractures of ribs, right side, subsequent encounter for fracture with routine healing: Secondary | ICD-10-CM | POA: Diagnosis not present

## 2021-10-13 DIAGNOSIS — E1159 Type 2 diabetes mellitus with other circulatory complications: Secondary | ICD-10-CM | POA: Diagnosis not present

## 2021-10-13 DIAGNOSIS — N400 Enlarged prostate without lower urinary tract symptoms: Secondary | ICD-10-CM

## 2021-10-13 DIAGNOSIS — S22079D Unspecified fracture of T9-T10 vertebra, subsequent encounter for fracture with routine healing: Secondary | ICD-10-CM | POA: Diagnosis not present

## 2021-10-13 DIAGNOSIS — Z87891 Personal history of nicotine dependence: Secondary | ICD-10-CM

## 2021-10-13 DIAGNOSIS — F32A Depression, unspecified: Secondary | ICD-10-CM

## 2021-10-13 DIAGNOSIS — Z7901 Long term (current) use of anticoagulants: Secondary | ICD-10-CM

## 2021-10-13 DIAGNOSIS — E1140 Type 2 diabetes mellitus with diabetic neuropathy, unspecified: Secondary | ICD-10-CM

## 2021-10-13 DIAGNOSIS — M170 Bilateral primary osteoarthritis of knee: Secondary | ICD-10-CM

## 2021-10-13 DIAGNOSIS — K219 Gastro-esophageal reflux disease without esophagitis: Secondary | ICD-10-CM

## 2021-10-13 DIAGNOSIS — D62 Acute posthemorrhagic anemia: Secondary | ICD-10-CM

## 2021-10-13 DIAGNOSIS — Z8673 Personal history of transient ischemic attack (TIA), and cerebral infarction without residual deficits: Secondary | ICD-10-CM

## 2021-10-13 DIAGNOSIS — H43812 Vitreous degeneration, left eye: Secondary | ICD-10-CM

## 2021-10-13 DIAGNOSIS — S32019D Unspecified fracture of first lumbar vertebra, subsequent encounter for fracture with routine healing: Secondary | ICD-10-CM

## 2021-10-13 DIAGNOSIS — F419 Anxiety disorder, unspecified: Secondary | ICD-10-CM

## 2021-10-13 DIAGNOSIS — Z86718 Personal history of other venous thrombosis and embolism: Secondary | ICD-10-CM

## 2021-10-13 DIAGNOSIS — G4700 Insomnia, unspecified: Secondary | ICD-10-CM

## 2021-10-13 DIAGNOSIS — E2609 Other primary hyperaldosteronism: Secondary | ICD-10-CM | POA: Diagnosis not present

## 2021-10-13 DIAGNOSIS — Z7982 Long term (current) use of aspirin: Secondary | ICD-10-CM

## 2021-10-13 DIAGNOSIS — Z9049 Acquired absence of other specified parts of digestive tract: Secondary | ICD-10-CM

## 2021-10-13 NOTE — Progress Notes (Signed)
A user error has taken place: encounter opened in error, closed for administrative reasons. Pt did not answer

## 2021-10-14 ENCOUNTER — Telehealth: Payer: Self-pay | Admitting: Internal Medicine

## 2021-10-14 NOTE — Telephone Encounter (Signed)
Spoke with [place] from [facility] who reported that the monitor showed a 2% afib burden ,and on strip 8, there was a rate of 208 bpm. Left message for patient to call clinic. Dr. Sallyanne Kuster (DOD) reviewed monitor tracings. He suggested that if patient has appointment f/u more than 3 months, to move it closer. Patient has appointment in July with Dr. Harl Bowie. At 4:15 pm wife returned call and was informed of reported rhthym on 5/14. Report is in [place]. She and patient do not recall any symptoms at that time. They will keep July appointment. Wife reports that patient sways when walking since his accident in January. Recommended that he use his cane to help prevent him from falling. Wife also reports low BP. While on the phone, BP 132/90 ,P 62. Wife informed that I will forward this note to Dr. Harl Bowie and that she will advise [place] if we need to move appointment sooner.

## 2021-10-14 NOTE — Telephone Encounter (Signed)
Returned call from wife. Wife wanted to report that upon thinking of monitor. She reported that on May 14 around 2:30 pm, patient turned white, didn't feel good, and had a BP of 170/90. Recommended that if this occurs again, they need to call 911. Wife voiced understanding.

## 2021-10-14 NOTE — Telephone Encounter (Signed)
Calling with monitor results for pt. Please advise

## 2021-10-15 ENCOUNTER — Telehealth: Payer: Self-pay | Admitting: Internal Medicine

## 2021-10-15 ENCOUNTER — Other Ambulatory Visit: Payer: Self-pay

## 2021-10-15 MED ORDER — METOPROLOL SUCCINATE ER 50 MG PO TB24: 75.0000 mg | ORAL_TABLET | Freq: Every day | ORAL | 3 refills | Status: DC

## 2021-10-15 NOTE — Telephone Encounter (Signed)
Called pt's wife, call was 26 minutes long. Spoke with her about pt's monitor results and Dr. Nelly Laurence recommendations to increase. She verbalized understanding. She expressed concern about increasing Metoprolol to 75 mg due to pt's blood pressure dropping. "Last week at his primary care it was 100/70 but he did not have any symptoms at the time. When he stands up he is off balance a lot of the time. He has fallen 6 times in 2 weeks. They say it is from his blood pressure dropping. Do I have to worry about his blood pressure bottoming out on me if we increase the Metoprolol?" Will get message to Dr. Harl Bowie for review.

## 2021-10-15 NOTE — Telephone Encounter (Signed)
Pt returning call for the results of the long term monitor. Req call back.

## 2021-10-18 MED ORDER — METOPROLOL SUCCINATE ER 50 MG PO TB24: 50.0000 mg | ORAL_TABLET | Freq: Every day | ORAL | 3 refills | Status: DC

## 2021-10-18 NOTE — Telephone Encounter (Addendum)
Janina Mayo, MD  You; Orvan July, RN 3 hours ago (8:37 AM)   MB If Mr. [place]ington blood pressure are too low the increase, he can go back to 50 mg. Also, please let us know if he notices persistent fast hearts prior to his next visit. Thank you !    Called and spoke with patients wife who states that patient has been getting dizzy/lightheaded and has had multiple falls within the last few weeks. Patients wife states patient fell yesterday that resulted in him getting multiple bruises/cuts and was bleeding from multiple areas. Patients wife states that patient did not hit his head, but that she begged him to go to the ER and he refused. Patients wife states that patient keeps getting dizzy/lightheaded and this results in him falling. Patients wife feels that this is related to his atrial fib and his BP dropping. Patients wife states that patient has had low BP's stating the lowest she has seen is 90/60 and states HR has been around 55-60bpm. Patient has also been having issues with his insulin and states that there is someone coming to their house today to have him set up with his new pump. Advised patients wife of importance of keeping patient hydrated as well, and she states that she does have some issues with this as well. Patients wife states that patient did tolerate the '50mg'$  of metoprolol and they are hesitant to increase- advised her to keep him on the '50mg'$  for now. Patients wife would like to know what to do from here and if Dr. Harl Bowie feels that patient should be seen again in office sooner due to issues with BP dropping/HR.   Made patient aware of ED precautions should new or worsening symptoms develop. Patient verbalized understanding.

## 2021-10-19 NOTE — Telephone Encounter (Signed)
Attempted to call patient, left message for patient to call back to office.      Janina Mayo, MD  You 20 hours ago (5:29 PM)   MB Please add him to my DOD schedule on Thursday. We can plan to do orthostatics and repeat EKG.

## 2021-10-20 ENCOUNTER — Telehealth: Payer: Self-pay

## 2021-10-20 NOTE — Telephone Encounter (Signed)
Pt's wife called and stated that pt fell on Sunday and is very sore. She thinks he needs to be looked at and possibly have xray done. Pt is scheduled for Friday 06/16 @ 3 PM.

## 2021-10-20 NOTE — Telephone Encounter (Signed)
Patient scheduled to see Dr. Harl Bowie tomorrow 6/15 DOD schedule at 8:30

## 2021-10-20 NOTE — Progress Notes (Signed)
Cardiology Office Note:    Date:  10/21/2021   ID:  Benjamin Powell, DOB [birth year], MRN [id number]  PCP:  Tammi Sou, MD   [facility] Providers Cardiologist:  Janina Mayo, MD     Referring MD: Tammi Sou, MD   No chief complaint on file. ?Atrial Fibrillaion  History of Present Illness:    Benjamin Powell is a 76 y.o. male with a hx of CKD Stage 3, OSA on CPAP , HTN, T2DM, CVA, hx of orthostatic hypotension, DVT on elqiuis,  fall complicated by rip fractures referral for ?afib  Saw Dr. Debara Pickett for difficult to control HTN in 2018. Prior EKGs sinus rhythm.  He had MRI Jan 2023 c/f stroke. Showed schwanonoma. This was in the setting of confusion. There is no notation of atrial fibrillation. He was having falls and fractured his rib.  He had cardiac monitor done in late January with no results sent over. CTA neck showed no obstructive carotid dx.  He had a CVA in the past as well.  BP 116/68 mmhg  Interim Hx 10/21/2021 Completed a zio that showed 2% burden of afib. He comes in to DOD for [facility] and dizziness with standing.  Orthostatics today + 26 mmHg drop standing; HR in the 50s with minimal increase with standing.    Cardiology Studies TTE 05/17/2021- normal EF , no significant valve  Ziopatch 10/14/2021- 2% burden of afib  Past Medical History:  Diagnosis Date   Anxiety and depression    BPH without obstruction/lower urinary tract symptoms    Cervical spondylosis    Surgery 2021   Chronic renal insufficiency, stage 3 (moderate) (HCC)    Frequent falls    GERD (gastroesophageal reflux disease)    Hemorrhoids    History of adenomatous polyp of colon    History of CVA (cerebrovascular accident) 10/2016   CTA head and neck with no occlusive dz. +advanced chronic ischemic dz changes on MR   History of DVT (deep vein thrombosis) 2022   L leg.  Hypercoag w/u NEG.   Hypertension    Treatment resistant   Impaired mobility and ADLs    OSA on CPAP    PAF  (paroxysmal atrial fibrillation) (HCC)    Recurrent nephrolithiasis    Type 2 diabetes with nephropathy [facility])    endocrinologist   Urge incontinence     Past Surgical History:  Procedure Laterality Date   ANKLE SURGERY  1993   left   ANTERIOR CERVICAL DECOMP/DISCECTOMY FUSION N/A 10/02/2019   Procedure: ANTERIOR CERVICAL DECOMPRESSION FUSION CERVICAL THREE-FOUR WITH INSTRUMENTATION AND ALLOGRAFT;  Surgeon: Phylliss Bob, MD;  Location: [facility];  Service: Orthopedics;  Laterality: N/A;   CATARACT EXTRACTION Right    CHOLECYSTECTOMY  1990   COLONOSCOPY     2013 multiple hyperplastic polyps. 04/08/19 Many hyperplastic/inflammatory polyps, adenoma x1. Recall nov 2023   ERCP  1990   Hammer Toe Repair Right 09/16/2016   RT #5   Partial Excision Phalanx Left 09/16/2016   LT#5   rib fixation  05/2021   TONSILLECTOMY AND ADENOIDECTOMY  1998   and sinus for sleep apnea   TRANSTHORACIC ECHOCARDIOGRAM  07/07/2017   07/2017 EF 55-60%, mild MR, no shunt. 05/2021 EF normal, valves nl    Current Medications: Current Meds  Medication Sig   AMBULATORY NON FORMULARY MEDICATION Medication Name: Aloe Gold probiotic daily   apixaban (ELIQUIS) 5 MG TABS tablet Take 5 mg by mouth 2 (two) times daily.   aspirin  81 MG EC tablet Take 1 tablet by mouth daily.   BD PEN NEEDLE NANO 2ND GEN 32G X 4 MM MISC 4 (four) times daily. as directed   Cholecalciferol 125 MCG (5000 UT) TABS Take by mouth.   Continuous Blood Gluc Sensor (DEXCOM G6 SENSOR) MISC by Does not apply route.   HUMALOG KWIKPEN 100 UNIT/ML KwikPen SMARTSIG:10 Unit(s) SUB-Q   insulin glargine, 2 Unit Dial, (TOUJEO MAX SOLOSTAR) 300 UNIT/ML Solostar Pen Inject into the skin.   midodrine (PROAMATINE) 2.5 MG tablet Take 1 tablet (2.5 mg total) by mouth 3 (three) times daily with meals.   OZEMPIC, 0.25 OR 0.5 MG/DOSE, 2 MG/3ML SOPN Inject into the skin.   sertraline (ZOLOFT) 100 MG tablet Take 1 tablet (100 mg total) by mouth daily.   [DISCONTINUED]  lisinopril (ZESTRIL) 20 MG tablet Take 20 mg by mouth daily.   [DISCONTINUED] metoprolol succinate (TOPROL XL) 50 MG 24 hr tablet Take 1 tablet (50 mg total) by mouth daily. Take with or immediately following a meal.     Allergies:   Amlodipine   Social History   Socioeconomic History   Marital status: Married    Spouse name: Not on file   Number of children: 2   Years of education: Not on file   Highest education level: Not on file  Occupational History   Not on file  Tobacco Use   Smoking status: Former    Packs/day: 0.30    Years: 50.00    Total pack years: 15.00    Types: Cigarettes    Quit date: 08/08/2019    Years since quitting: 2.2   Smokeless tobacco: Never  Vaping Use   Vaping Use: Never used  Substance and Sexual Activity   Alcohol use: No   Drug use: No   Sexual activity: Not on file  Other Topics Concern   Not on file  Social History Narrative   Married   Educ: college   Occup:    52 pack-yr tobacco, quit 2022.   No alc/drugs.   Social Determinants of Health   Financial Resource Strain: Not on file  Food Insecurity: Not on file  Transportation Needs: Not on file  Physical Activity: Not on file  Stress: Not on file  Social Connections: Not on file     Family History: The patient's family history includes Cancer in his brother and father; Colitis in his brother and sister; Diabetes in his brother and sister; Early death in his father; Heart attack in his brother; Pancreatic cancer in his mother; Stroke in his brother and brother. There is no history of Colon cancer, Esophageal cancer, Rectal cancer, or Stomach cancer.  ROS:   Please see the history of present illness.     All other systems reviewed and are negative.  EKGs/Labs/Other Studies Reviewed:    The following studies were reviewed today:   EKG:  EKG is  ordered today.  The ekg ordered today demonstrates   09/06/2021:NSR, RBBB  10/20/2021: NSR, RBBB, LAD  Recent Labs: 08/06/2021: ALT  33; BUN 23; Creat 1.32; Hemoglobin 16.0; Platelets 211; Potassium 4.5; Sodium 140; TSH 1.43  Recent Lipid Panel    Component Value Date/Time   CHOL 99 09/22/2017 0912   TRIG 139 09/22/2017 0912   HDL 36 (L) 09/22/2017 0912   CHOLHDL 2.8 09/22/2017 0912   VLDL 45 (H) 04/07/2016 1635   LDLCALC 41 09/22/2017 0912     Risk Assessment/Calculations:    CHA2DS2-VASc Score = 6  This indicates a 9.7% annual risk of stroke. The patient's score is based upon: CHF History: 0 HTN History: 1 Diabetes History: 1 Stroke History: 2 Vascular Disease History: 0 Age Score: 2 Gender Score: 0          Physical Exam:    VS:   Vitals:   10/21/21 0807  BP: 108/72  Pulse: (!) 57      Wt Readings from Last 3 Encounters:  10/21/21 209 lb (94.8 kg)  10/08/21 208 lb 6.4 oz (94.5 kg)  09/10/21 208 lb (94.3 kg)     GEN:  Well nourished, well developed in no acute distress HEENT: Normal NECK: No JVD; No carotid bruits LYMPHATICS: No lymphadenopathy CARDIAC: RRR, no murmurs, rubs, gallops RESPIRATORY:  Clear to auscultation without rales, wheezing or rhonchi  ABDOMEN: Soft, non-tender, non-distended MUSCULOSKELETAL:  No edema; No deformity  SKIN: Warm and dry NEUROLOGIC:  Alert and oriented x 3 PSYCHIATRIC:  Normal affect   ASSESSMENT:    Paroxysmal Atrial Fibrillation: He has 2% burden of Afib on ziopatch. He can hold his eliquis for dental procedures. - continue eliquis 5 mg BID - decrease metop XL 12.5  mg XL from 50 per below  Falls/Autonomic Dysfunction: symptoms associated with orthostatic hypotension. Recommendations below. His blood pressures are labile; his management is challenging. Continue to recommend hydration with electrolytes, compression stockings and BP checks. Home PT. Heart rates demonstrates chronotropic incompetence with standing, will decrease his BB. Will add low dose midodrine.  HTN: stop lisinopril 20 mg daily    PLAN:    In order of problems listed  above:  Stop lisinopril Decrease metoprolol 12.5 mg XL TTE Midodrine 2.5 mg TID Abdominal Binder Follow up in 3 months      Medication Adjustments/Labs and Tests Ordered: Current medicines are reviewed at length with the patient today.  Concerns regarding medicines are outlined above.  Orders Placed This Encounter  Procedures   EKG 12-Lead   Meds ordered this encounter  Medications   metoprolol succinate (TOPROL XL) 25 MG 24 hr tablet    Sig: Take 0.5 tablets (12.5 mg total) by mouth daily. Take with or immediately following a meal.    Dispense:  45 tablet    Refill:  3   midodrine (PROAMATINE) 2.5 MG tablet    Sig: Take 1 tablet (2.5 mg total) by mouth 3 (three) times daily with meals.    Dispense:  90 tablet    Refill:  2    Patient Instructions  Medication Instructions:  STOP: LISINOPRIL  DECREASE METOPROLOL TO 12.'5mg'$  ONCE DAILY   START: MIDODRINE 2.'5mg'$  THREE TIMES DAILY   *If you need a refill on your cardiac medications before your next appointment, please call your pharmacy*  Lab Work: None Ordered At This Time.  If you have labs (blood work) drawn today and your tests are completely normal, you will receive your results only by: [place] (if you have MyChart) OR A paper copy in the mail If you have any lab test that is abnormal or we need to change your treatment, we will call you to review the results.  Testing/Procedures: Your physician has requested that you have an echocardiogram. Echocardiography is a painless test that uses sound waves to create images of your heart. It provides your doctor with information about the size and shape of your heart and how well your heart's chambers and valves are working. You may receive an ultrasound enhancing agent through an IV if needed to better  visualize your heart during the echo.This procedure takes approximately one hour. There are no restrictions for this procedure. This will take place at the [address], [address].   Follow-Up: At [facility], you and your health needs are our priority.  As part of our continuing mission to provide you with exceptional heart care, we have created designated Provider Care Teams.  These Care Teams include your primary Cardiologist (physician) and Advanced Practice Providers (APPs -  Physician Assistants and Nurse Practitioners) who all work together to provide you with the care you need, when you need it.  Your next appointment:   3 month(s)  The format for your next appointment:   In Person  Provider:   Janina Mayo, MD    Other Instructions  For your falls, I recommend, abdominal binder and wearing your compression stockings. We will stop your lisinopril. Will decrease your metoprolol. Will add a medicine to help with the drop    Signed, Janina Mayo, MD  10/21/2021 8:50 AM    [place]

## 2021-10-20 NOTE — Telephone Encounter (Signed)
FYI  Please see below

## 2021-10-20 NOTE — Telephone Encounter (Signed)
Home Health notice of patient fall on Sunday 6/11 Caller Name: [place] Name: [facility]  Callback number:  [phone]  Reason: Reporting patient fall on 6/11 off ladder, landed on some nails that scraped skin, covered by band-aids on arms, right flank is tender, no bruising, no stitches needed  Frequency: N/A

## 2021-10-21 ENCOUNTER — Telehealth: Payer: Self-pay

## 2021-10-21 ENCOUNTER — Ambulatory Visit (INDEPENDENT_AMBULATORY_CARE_PROVIDER_SITE_OTHER): Payer: Medicare HMO | Admitting: Internal Medicine

## 2021-10-21 ENCOUNTER — Encounter: Payer: Self-pay | Admitting: Internal Medicine

## 2021-10-21 VITALS — BP 108/72 | HR 57 | Ht 72.0 in | Wt 209.0 lb

## 2021-10-21 DIAGNOSIS — I951 Orthostatic hypotension: Secondary | ICD-10-CM

## 2021-10-21 DIAGNOSIS — I4891 Unspecified atrial fibrillation: Secondary | ICD-10-CM

## 2021-10-21 MED ORDER — METOPROLOL SUCCINATE ER 25 MG PO TB24: 12.5000 mg | ORAL_TABLET | Freq: Every day | ORAL | 3 refills | Status: AC

## 2021-10-21 MED ORDER — MIDODRINE HCL 2.5 MG PO TABS: 2.5000 mg | ORAL_TABLET | Freq: Three times a day (TID) | ORAL | 2 refills | Status: DC

## 2021-10-21 NOTE — Telephone Encounter (Signed)
Yes okay 

## 2021-10-21 NOTE — Telephone Encounter (Signed)
A user error has taken place: encounter opened in error, closed for administrative reasons.

## 2021-10-21 NOTE — Telephone Encounter (Signed)
Secure VM left regarding PT

## 2021-10-21 NOTE — Patient Instructions (Addendum)
Medication Instructions:  STOP: LISINOPRIL  DECREASE METOPROLOL TO 12.'5mg'$  ONCE DAILY   START: MIDODRINE 2.'5mg'$  THREE TIMES DAILY   *If you need a refill on your cardiac medications before your next appointment, please call your pharmacy*  Lab Work: None Ordered At This Time.  If you have labs (blood work) drawn today and your tests are completely normal, you will receive your results only by: [place] (if you have MyChart) OR A paper copy in the mail If you have any lab test that is abnormal or we need to change your treatment, we will call you to review the results.  Testing/Procedures: Your physician has requested that you have an echocardiogram. Echocardiography is a painless test that uses sound waves to create images of your heart. It provides your doctor with information about the size and shape of your heart and how well your heart's chambers and valves are working. You may receive an ultrasound enhancing agent through an IV if needed to better visualize your heart during the echo.This procedure takes approximately one hour. There are no restrictions for this procedure. This will take place at the [address].   Follow-Up: At [facility], you and your health needs are our priority.  As part of our continuing mission to provide you with exceptional heart care, we have created designated Provider Care Teams.  These Care Teams include your primary Cardiologist (physician) and Advanced Practice Providers (APPs -  Physician Assistants and Nurse Practitioners) who all work together to provide you with the care you need, when you need it.  Your next appointment:   3 month(s)  The format for your next appointment:   In Person  Provider:   Janina Mayo, MD    Other Instructions  For your falls, I recommend, abdominal binder and wearing your compression stockings. We will stop your lisinopril. Will decrease your metoprolol. Will add a medicine to help with the  drop

## 2021-10-21 NOTE — Telephone Encounter (Signed)
Lauren from [facility] called asking for VO for PT evaluation next week. She can be reached at [phone] which is a secured line, vm can be left.

## 2021-10-22 ENCOUNTER — Ambulatory Visit (INDEPENDENT_AMBULATORY_CARE_PROVIDER_SITE_OTHER): Payer: Medicare HMO | Admitting: Family Medicine

## 2021-10-22 ENCOUNTER — Encounter: Payer: Self-pay | Admitting: Family Medicine

## 2021-10-22 ENCOUNTER — Ambulatory Visit (INDEPENDENT_AMBULATORY_CARE_PROVIDER_SITE_OTHER): Payer: Medicare HMO

## 2021-10-22 VITALS — BP 113/69 | HR 62 | Temp 98.6°F | Ht 72.0 in | Wt 209.8 lb

## 2021-10-22 DIAGNOSIS — Z8781 Personal history of (healed) traumatic fracture: Secondary | ICD-10-CM | POA: Diagnosis not present

## 2021-10-22 DIAGNOSIS — S20211A Contusion of right front wall of thorax, initial encounter: Secondary | ICD-10-CM

## 2021-10-22 DIAGNOSIS — T148XXA Other injury of unspecified body region, initial encounter: Secondary | ICD-10-CM

## 2021-10-22 DIAGNOSIS — Z23 Encounter for immunization: Secondary | ICD-10-CM

## 2021-10-22 DIAGNOSIS — W19XXXA Unspecified fall, initial encounter: Secondary | ICD-10-CM

## 2021-10-22 IMAGING — DX DG RIBS 2V*R*
3 series · 3 of 3 positions shown · non-contrast
Comparison: Correlation is made with two views of the chest dated
[DATE]

CLINICAL DATA: R posterior chest wall contusion 2 d/a. He has prior
hx of rib fracture and rib fixation surgery [DATE] on R

EXAM:
RIGHT RIBS - 2 VIEWS, 3 images

[rib ap (1 of 2)]
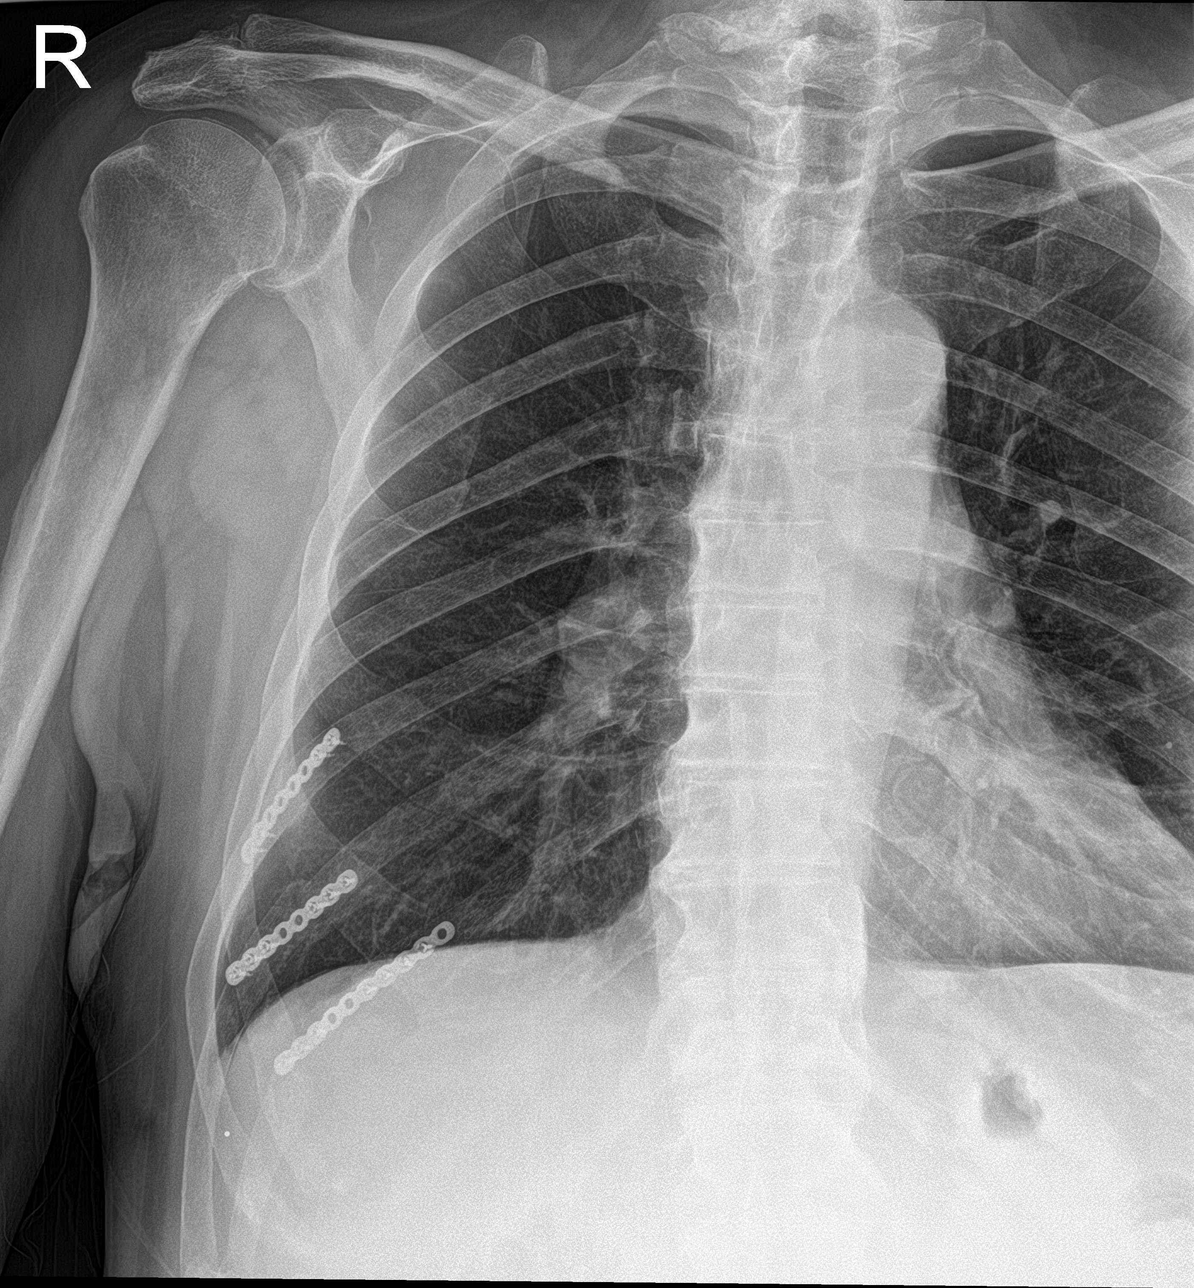

[rib ap (2 of 2)]
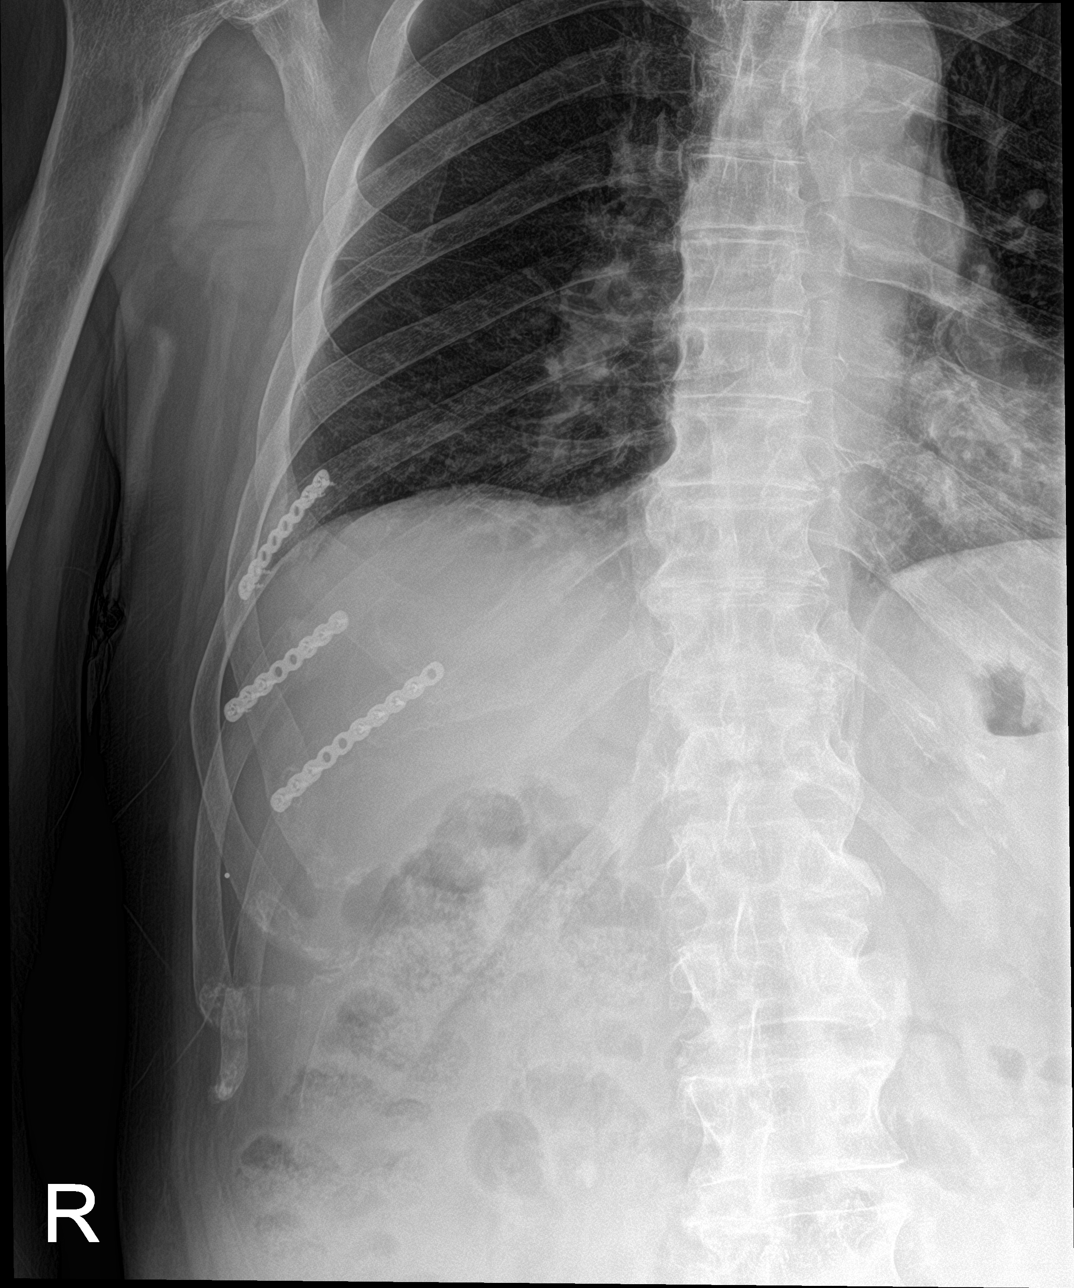

[rib ap obl]
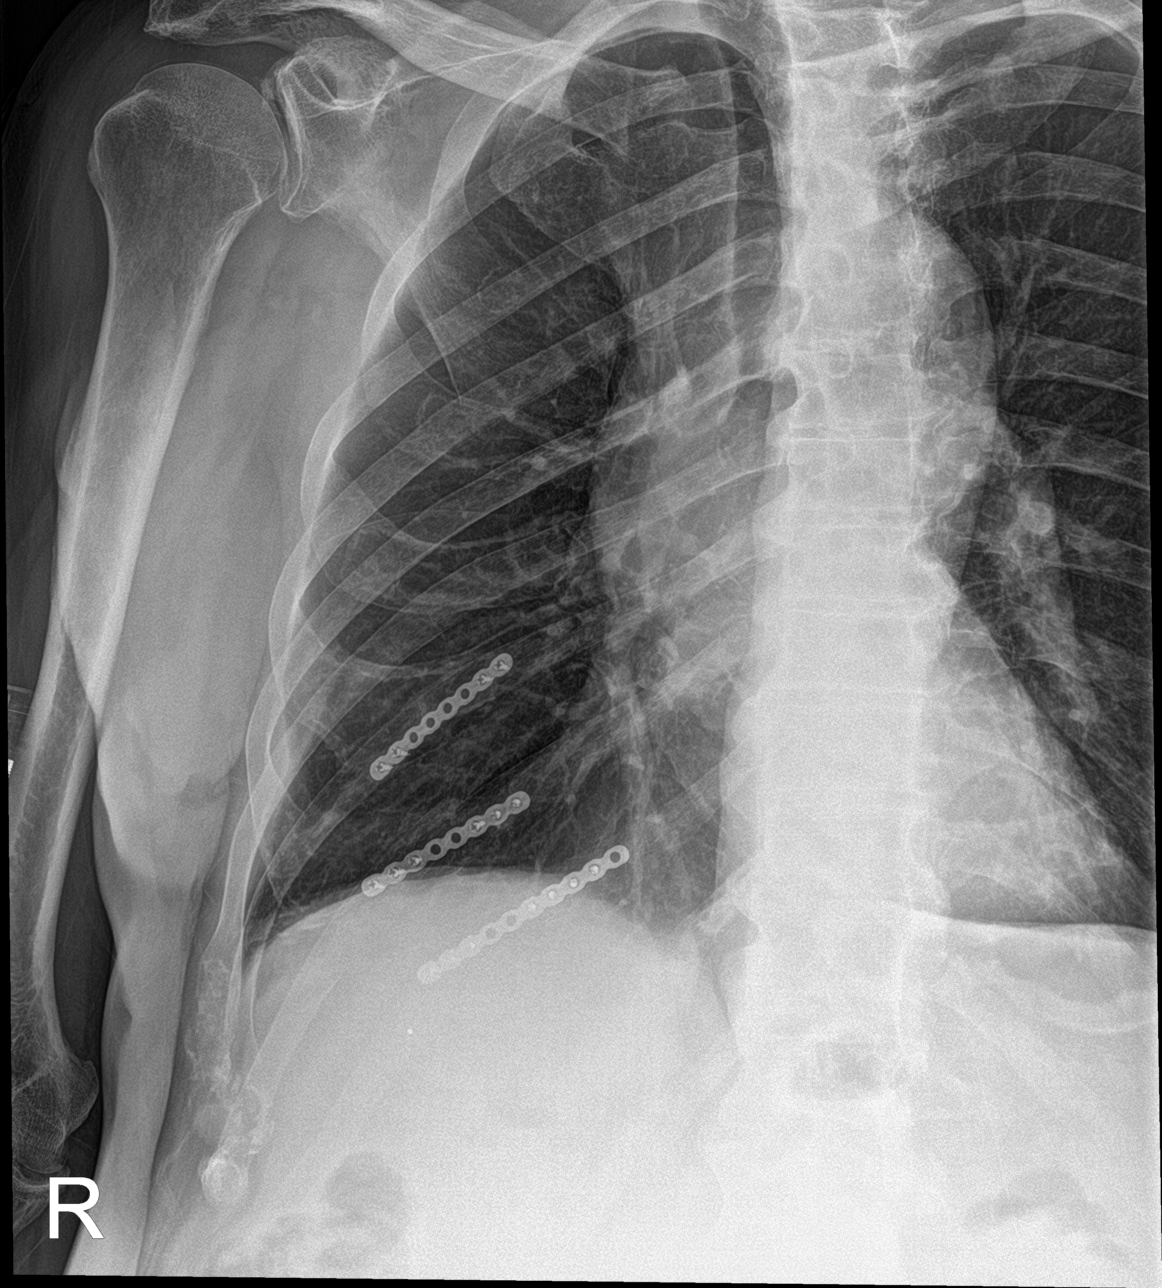

[3 of 3 positions shown; findings below may reference images not displayed]

FINDINGS: There are postsurgical changes seen for the fractured right eighth,
ninth and tenth ribs. There is some callus formation seen at the
fractured ninth rib site. There is approximately 8 mm lucency seen
at the fractured tenth rib.
IMPRESSION: No new fracture seen. Postsurgical changes for the fractured right
eighth, ninth and tenth ribs. No new fracture seen.

## 2021-10-22 NOTE — Addendum Note (Signed)
Addended by: Deveron Furlong D on: 10/22/2021 03:09 PM   Modules accepted: Orders

## 2021-10-22 NOTE — Progress Notes (Signed)
OFFICE VISIT  10/22/2021  CC:  Chief Complaint  Patient presents with   Fall    5 days ago; fell off a ladder. C/o pain per pt's wife; unable to get out of bed, use the bathroom. Would like to determine if any broken bones.    Patient is a 76 y.o. male who presents accompanied by his wife for recent fall.  HPI: 2 days ago he was a few steps up on a ladder fixing something on his kitchen ceiling and he fell backwards after losing balance.  He hit his back on the [place].  He did not hit his head.  He then fell to the floor.  He had some nails on the ladder that he was using and these had fallen and he had several of these sticking out of his skin.  He had some bleeding but it was controlled quickly.  Since then he has had a bit worse pain in the right posterolateral rib cage, directly on the site where he had prior rib fracture and fixation surgery. Puncture wounds are healing appropriately He has no shortness of breath, no pain with deep breathing.  He is on aspirin and Eliquis.  Past Medical History:  Diagnosis Date   Anxiety and depression    BPH without obstruction/lower urinary tract symptoms    Cervical spondylosis    Surgery 2021   Chronic renal insufficiency, stage 3 (moderate) (HCC)    Frequent falls    GERD (gastroesophageal reflux disease)    Hemorrhoids    History of adenomatous polyp of colon    History of CVA (cerebrovascular accident) 10/2016   CTA head and neck with no occlusive dz. +advanced chronic ischemic dz changes on MR   History of DVT (deep vein thrombosis) 2022   L leg.  Hypercoag w/u NEG.   Hypertension    Treatment resistant   Impaired mobility and ADLs    OSA on CPAP    PAF (paroxysmal atrial fibrillation) (HCC)    Recurrent nephrolithiasis    Type 2 diabetes with nephropathy [facility])    endocrinologist   Urge incontinence     Past Surgical History:  Procedure Laterality Date   ANKLE SURGERY  1993   left    ANTERIOR CERVICAL DECOMP/DISCECTOMY FUSION N/A 10/02/2019   Procedure: ANTERIOR CERVICAL DECOMPRESSION FUSION CERVICAL THREE-FOUR WITH INSTRUMENTATION AND ALLOGRAFT;  Surgeon: Phylliss Bob, MD;  Location: [facility];  Service: Orthopedics;  Laterality: N/A;   CATARACT EXTRACTION Right    CHOLECYSTECTOMY  1990   COLONOSCOPY     2013 multiple hyperplastic polyps. 04/08/19 Many hyperplastic/inflammatory polyps, adenoma x1. Recall nov 2023   ERCP  1990   Hammer Toe Repair Right 09/16/2016   RT #5   Partial Excision Phalanx Left 09/16/2016   LT#5   rib fixation  05/2021   TONSILLECTOMY AND ADENOIDECTOMY  1998   and sinus for sleep apnea   TRANSTHORACIC ECHOCARDIOGRAM  07/07/2017   07/2017 EF 55-60%, mild MR, no shunt. 05/2021 EF normal, valves nl    Outpatient Medications Prior to Visit  Medication Sig Dispense Refill   AMBULATORY NON FORMULARY MEDICATION Medication Name: Aloe Gold probiotic daily     apixaban (ELIQUIS) 5 MG TABS tablet Take 5 mg by mouth 2 (two) times daily.     aspirin 81 MG EC tablet Take 1 tablet by mouth daily.     BD PEN NEEDLE NANO 2ND GEN 32G X 4 MM MISC 4 (four) times daily. as  directed     Cholecalciferol 125 MCG (5000 UT) TABS Take by mouth.     Continuous Blood Gluc Sensor (DEXCOM G6 SENSOR) MISC by Does not apply route.     HUMALOG KWIKPEN 100 UNIT/ML KwikPen SMARTSIG:10 Unit(s) SUB-Q     insulin glargine, 2 Unit Dial, (TOUJEO MAX SOLOSTAR) 300 UNIT/ML Solostar Pen Inject into the skin.     metoprolol succinate (TOPROL XL) 25 MG 24 hr tablet Take 0.5 tablets (12.5 mg total) by mouth daily. Take with or immediately following a meal. 45 tablet 3   midodrine (PROAMATINE) 2.5 MG tablet Take 1 tablet (2.5 mg total) by mouth 3 (three) times daily with meals. 90 tablet 2   OZEMPIC, 0.25 OR 0.5 MG/DOSE, 2 MG/3ML SOPN Inject into the skin.     sertraline (ZOLOFT) 100 MG tablet Take 1 tablet (100 mg total) by mouth daily. 60 tablet 0   ascorbic acid (VITAMIN C) 1000 MG  tablet Take 1 tablet by mouth daily. (Patient not taking: Reported on 10/08/2021)     No facility-administered medications prior to visit.    Allergies  Allergen Reactions   Amlodipine Other (See Comments)    Gingival hyperplasia    ROS As per HPI  PE:    10/22/2021    2:21 PM 10/21/2021    8:07 AM 10/08/2021    1:03 PM  Vitals with BMI  Height '6\' 0"'$  '6\' 0"'$    Weight 209 lbs 13 oz 209 lbs 208 lbs 6 oz  BMI 28.45 16.10 96.04  Systolic 540 981 191  Diastolic 69 72 70  Pulse 62 57 71   Physical Exam  General: Alert and well-appearing.  Oriented x4.  Pleasant and conversant and jocular. Cardiovascular shows regular rhythm and rate without murmur. Lungs are clear to auscultation bilaterally, equal breath sounds, nonlabored respirations. He has focal tenderness to palpation in the right posterolateral chest wall directly over his surgical scar from prior rib fixation surgery.  He is otherwise nontender.  He has a few healing/dry punctate skin wounds.  No erythema. He has no skin bruising or hematoma.  LABS:  Last CBC Lab Results  Component Value Date   WBC 8.8 08/06/2021   HGB 16.0 08/06/2021   HCT 47.4 08/06/2021   MCV 92.2 08/06/2021   MCH 31.1 08/06/2021   RDW 12.6 08/06/2021   PLT 211 47/82/9562   Last metabolic panel Lab Results  Component Value Date   GLUCOSE 181 (H) 08/06/2021   NA 140 08/06/2021   K 4.5 08/06/2021   CL 104 08/06/2021   CO2 24 08/06/2021   BUN 23 08/06/2021   CREATININE 1.32 (H) 08/06/2021   GFRNONAA 38 (L) 09/27/2019   CALCIUM 9.9 08/06/2021   PROT 6.6 08/06/2021   ALBUMIN 4.0 09/27/2019   BILITOT 0.4 08/06/2021   ALKPHOS 69 09/27/2019   AST 14 08/06/2021   ALT 33 08/06/2021   ANIONGAP 12 09/27/2019   Last hemoglobin A1c Lab Results  Component Value Date   HGBA1C 10.9 05/13/2021   IMPRESSION AND PLAN:  #1 right posterolateral chest wall contusion--- sustained 2 days ago in a fall from 3 steps up on a ladder.  Unfortunately this is  right at the area where he had prior rib fracture and surgical fixation procedure in 05/2021. Will check rib films today.  It may not change anything we do right now but it would be good information going forward. He has a few punctate puncture wounds from clean nails.  It has been  10 years since his last tetanus so I will give him Tdap today.  An After Visit Summary was printed and given to the patient.  FOLLOW UP: No follow-ups on file.  Signed:  Crissie Sickles, MD           10/22/2021

## 2021-10-23 ENCOUNTER — Ambulatory Visit (INDEPENDENT_AMBULATORY_CARE_PROVIDER_SITE_OTHER): Payer: Medicare HMO

## 2021-10-23 DIAGNOSIS — Z0000 Encounter for general adult medical examination without abnormal findings: Secondary | ICD-10-CM | POA: Diagnosis not present

## 2021-10-23 NOTE — Patient Instructions (Signed)
Health Maintenance, Male Adopting a healthy lifestyle and getting preventive care are important in promoting health and wellness. Ask your health care provider about: The right schedule for you to have regular tests and exams. Things you can do on your own to prevent diseases and keep yourself healthy. What should I know about diet, weight, and exercise? Eat a healthy diet  Eat a diet that includes plenty of vegetables, fruits, low-fat dairy products, and lean protein. Do not eat a lot of foods that are high in solid fats, added sugars, or sodium. Maintain a healthy weight Body mass index (BMI) is a measurement that can be used to identify possible weight problems. It estimates body fat based on height and weight. Your health care provider can help determine your BMI and help you achieve or maintain a healthy weight. Get regular exercise Get regular exercise. This is one of the most important things you can do for your health. Most adults should: Exercise for at least 150 minutes each week. The exercise should increase your heart rate and make you sweat (moderate-intensity exercise). Do strengthening exercises at least twice a week. This is in addition to the moderate-intensity exercise. Spend less time sitting. Even light physical activity can be beneficial. Watch cholesterol and blood lipids Have your blood tested for lipids and cholesterol at 75 years of age, then have this test every 5 years. You may need to have your cholesterol levels checked more often if: Your lipid or cholesterol levels are high. You are older than 75 years of age. You are at high risk for heart disease. What should I know about cancer screening? Many types of cancers can be detected early and may often be prevented. Depending on your health history and family history, you may need to have cancer screening at various ages. This may include screening for: Colorectal cancer. Prostate cancer. Skin cancer. Lung  cancer. What should I know about heart disease, diabetes, and high blood pressure? Blood pressure and heart disease High blood pressure causes heart disease and increases the risk of stroke. This is more likely to develop in people who have high blood pressure readings or are overweight. Talk with your health care provider about your target blood pressure readings. Have your blood pressure checked: Every 3-5 years if you are 18-39 years of age. Every year if you are 40 years old or older. If you are between the ages of 65 and 75 and are a current or former smoker, ask your health care provider if you should have a one-time screening for abdominal aortic aneurysm (AAA). Diabetes Have regular diabetes screenings. This checks your fasting blood sugar level. Have the screening done: Once every three years after age 45 if you are at a normal weight and have a low risk for diabetes. More often and at a younger age if you are overweight or have a high risk for diabetes. What should I know about preventing infection? Hepatitis B If you have a higher risk for hepatitis B, you should be screened for this virus. Talk with your health care provider to find out if you are at risk for hepatitis B infection. Hepatitis C Blood testing is recommended for: Everyone born from 1945 through 1965. Anyone with known risk factors for hepatitis C. Sexually transmitted infections (STIs) You should be screened each year for STIs, including gonorrhea and chlamydia, if: You are sexually active and are younger than 75 years of age. You are older than 75 years of age and your   health care provider tells you that you are at risk for this type of infection. Your sexual activity has changed since you were last screened, and you are at increased risk for chlamydia or gonorrhea. Ask your health care provider if you are at risk. Ask your health care provider about whether you are at high risk for HIV. Your health care provider  may recommend a prescription medicine to help prevent HIV infection. If you choose to take medicine to prevent HIV, you should first get tested for HIV. You should then be tested every 3 months for as long as you are taking the medicine. Follow these instructions at home: Alcohol use Do not drink alcohol if your health care provider tells you not to drink. If you drink alcohol: Limit how much you have to 0-2 drinks a day. Know how much alcohol is in your drink. In the [place] one drink equals one 12 oz bottle of beer (355 mL), one 5 oz glass of wine (148 mL), or one 1 oz glass of hard liquor (44 mL). Lifestyle Do not use any products that contain nicotine or tobacco. These products include cigarettes, chewing tobacco, and vaping devices, such as e-cigarettes. If you need help quitting, ask your health care provider. Do not use street drugs. Do not share needles. Ask your health care provider for help if you need support or information about quitting drugs. General instructions Schedule regular health, dental, and eye exams. Stay current with your vaccines. Tell your health care provider if: You often feel depressed. You have ever been abused or do not feel safe at home. Summary Adopting a healthy lifestyle and getting preventive care are important in promoting health and wellness. Follow your health care provider's instructions about healthy diet, exercising, and getting tested or screened for diseases. Follow your health care provider's instructions on monitoring your cholesterol and blood pressure. This information is not intended to replace advice given to you by your health care provider. Make sure you discuss any questions you have with your health care provider. Document Revised: 09/14/2020 Document Reviewed: 09/14/2020 Elsevier Patient Education  2023 [employer] Inc[employer]  

## 2021-10-23 NOTE — Progress Notes (Signed)
Subjective:   Benjamin Powell is a 76 y.o. male who presents for an Initial Medicare Annual Wellness Visit.  I connected with  [name] on 10/23/21 by an audio only telemedicine application and verified that I am speaking with the correct person using two identifiers.   I discussed the limitations, risks, security and privacy concerns of performing an evaluation and management service by telephone and the availability of in person appointments. I also discussed with the patient that there may be a patient responsible charge related to this service. The patient expressed understanding and verbally consented to this telephonic visit.  Location of Patient: home Location of Provider: office  List any persons and their role that are participating in the visit with the patient.   [facility]  Review of Systems    Defer to PCP Cardiac Risk Factors include: advanced age (>54mn, >>105women);diabetes mellitus     Objective:    There were no vitals filed for this visit. There is no height or weight on file to calculate BMI.     10/23/2021    9:43 AM 10/03/2019    8:00 AM 10/02/2019    8:57 AM 05/22/2015    9:15 PM 02/07/2014    7:53 PM 10/11/2013    2:51 PM  Advanced Directives  Does Patient Have a Medical Advance Directive? No No No No No Patient does not have advance directive;Patient would not like information  Would patient like information on creating a medical advance directive? No - Patient declined No - Patient declined  No - patient declined information      Current Medications (verified) Outpatient Encounter Medications as of 10/23/2021  Medication Sig   AMBULATORY NON FORMULARY MEDICATION Medication Name: Aloe Gold probiotic daily   apixaban (ELIQUIS) 5 MG TABS tablet Take 5 mg by mouth 2 (two) times daily.   ascorbic acid (VITAMIN C) 1000 MG tablet Take 1 tablet by mouth daily. (Patient not taking: Reported on 10/08/2021)   aspirin 81 MG EC tablet  Take 1 tablet by mouth daily.   BD PEN NEEDLE NANO 2ND GEN 32G X 4 MM MISC 4 (four) times daily. as directed   Cholecalciferol 125 MCG (5000 UT) TABS Take by mouth.   Continuous Blood Gluc Sensor (DEXCOM G6 SENSOR) MISC by Does not apply route.   HUMALOG KWIKPEN 100 UNIT/ML KwikPen SMARTSIG:10 Unit(s) SUB-Q   insulin glargine, 2 Unit Dial, (TOUJEO MAX SOLOSTAR) 300 UNIT/ML Solostar Pen Inject into the skin.   metoprolol succinate (TOPROL XL) 25 MG 24 hr tablet Take 0.5 tablets (12.5 mg total) by mouth daily. Take with or immediately following a meal.   midodrine (PROAMATINE) 2.5 MG tablet Take 1 tablet (2.5 mg total) by mouth 3 (three) times daily with meals.   OZEMPIC, 0.25 OR 0.5 MG/DOSE, 2 MG/3ML SOPN Inject into the skin.   sertraline (ZOLOFT) 100 MG tablet Take 1 tablet (100 mg total) by mouth daily.   No facility-administered encounter medications on file as of 10/23/2021.    Allergies (verified) Amlodipine   History: Past Medical History:  Diagnosis Date   Anxiety and depression    BPH without obstruction/lower urinary tract symptoms    Cervical spondylosis    Surgery 2021   Chronic renal insufficiency, stage 3 (moderate) (HCC)    Frequent falls    GERD (gastroesophageal reflux disease)    Hemorrhoids    History of adenomatous polyp of colon    History of CVA (cerebrovascular accident) 10/2016  CTA head and neck with no occlusive dz. +advanced chronic ischemic dz changes on MR   History of DVT (deep vein thrombosis) 2022   L leg.  Hypercoag w/u NEG.   Hypertension    Treatment resistant   Impaired mobility and ADLs    OSA on CPAP    PAF (paroxysmal atrial fibrillation) (HCC)    Recurrent nephrolithiasis    Type 2 diabetes with nephropathy [facility])    endocrinologist   Urge incontinence    Past Surgical History:  Procedure Laterality Date   ANKLE SURGERY  1993   left   ANTERIOR CERVICAL DECOMP/DISCECTOMY FUSION N/A 10/02/2019   Procedure: ANTERIOR CERVICAL  DECOMPRESSION FUSION CERVICAL THREE-FOUR WITH INSTRUMENTATION AND ALLOGRAFT;  Surgeon: Phylliss Bob, MD;  Location: [facility];  Service: Orthopedics;  Laterality: N/A;   CATARACT EXTRACTION Right    CHOLECYSTECTOMY  1990   COLONOSCOPY     2013 multiple hyperplastic polyps. 04/08/19 Many hyperplastic/inflammatory polyps, adenoma x1. Recall nov 2023   ERCP  1990   Hammer Toe Repair Right 09/16/2016   RT #5   Partial Excision Phalanx Left 09/16/2016   LT#5   rib fixation  05/2021   TONSILLECTOMY AND ADENOIDECTOMY  1998   and sinus for sleep apnea   TRANSTHORACIC ECHOCARDIOGRAM  07/07/2017   07/2017 EF 55-60%, mild MR, no shunt. 05/2021 EF normal, valves nl   Family History  Problem Relation Age of Onset   Pancreatic cancer Mother    Cancer Father    Early death Father    Colitis Sister    Diabetes Sister    Stroke Brother    Colitis Brother    Diabetes Brother    Heart attack Brother    Cancer Brother    Stroke Brother    Colon cancer Neg Hx    Esophageal cancer Neg Hx    Rectal cancer Neg Hx    Stomach cancer Neg Hx    Social History   Socioeconomic History   Marital status: Married    Spouse name: Not on file   Number of children: 2   Years of education: Not on file   Highest education level: Not on file  Occupational History   Not on file  Tobacco Use   Smoking status: Former    Packs/day: 0.30    Years: 50.00    Total pack years: 15.00    Types: Cigarettes    Quit date: 08/08/2019    Years since quitting: 2.2   Smokeless tobacco: Never  Vaping Use   Vaping Use: Never used  Substance and Sexual Activity   Alcohol use: No   Drug use: No   Sexual activity: Not on file  Other Topics Concern   Not on file  Social History Narrative   Married   Educ: college   Occup:    55 pack-yr tobacco, quit 2022.   No alc/drugs.   Social Determinants of Health   Financial Resource Strain: Low Risk  (10/23/2021)   Overall Financial Resource Strain (CARDIA)    Difficulty  of Paying Living Expenses: Not very hard  Food Insecurity: Food Insecurity Present (10/23/2021)   Hunger Vital Sign    Worried About Running Out of Food in the Last Year: Often true    Ran Out of Food in the Last Year: Often true  Transportation Needs: No Transportation Needs (10/23/2021)   PRAPARE - Hydrologist (Medical): No    Lack of Transportation (Non-Medical): No  Physical  Activity: Inactive (10/23/2021)   Exercise Vital Sign    Days of Exercise per Week: 0 days    Minutes of Exercise per Session: 0 min  Stress: Stress Concern Present (10/23/2021)   [place]    Feeling of Stress : Very much  Social Connections: Socially Isolated (10/23/2021)   Social Connection and Isolation Panel [NHANES]    Frequency of Communication with Friends and Family: Never    Frequency of Social Gatherings with Friends and Family: Never    Attends Religious Services: Never    Marine scientist or Organizations: No    Attends Music therapist: Never    Marital Status: Married    Tobacco Counseling Counseling given: Not Answered   Clinical Intake:  Pre-visit preparation completed: No  Pain : No/denies pain     Nutritional Risks: None Diabetes: Yes CBG done?: No Did pt. bring in CBG monitor from home?: No  How often do you need to have someone help you when you read instructions, pamphlets, or other written materials from your doctor or pharmacy?: 2 - Rarely  Diabetic?yes  Interpreter Needed?: No      Activities of Daily Living    10/23/2021    9:43 AM  In your present state of health, do you have any difficulty performing the following activities:  Hearing? 0  Vision? 0  Difficulty concentrating or making decisions? 0  Walking or climbing stairs? 0  Dressing or bathing? 0  Doing errands, shopping? 1  Using the Toilet? N  In the past six months, have you accidently  leaked urine? Y  Do you have problems with loss of bowel control? Y  Managing your Medications? Y  Managing your Finances? Y  Housekeeping or managing your Housekeeping? Y    Patient Care Team: Tammi Sou, MD as PCP - General (Family Medicine) Janina Mayo, MD as PCP - Cardiology (Cardiology) Silverio Decamp, MD as Consulting Physician (Sports Medicine) Ladene Artist, MD as Consulting Physician (Gastroenterology) Gerome Apley, MD as Consulting Physician (Endocrinology) [facility] Sanjuana Mae, MD as Consulting Physician (Vascular Surgery) Riki Sheer, MD as Consulting Physician (Neurosurgery) Phylliss Bob, MD as Consulting Physician (Orthopedic Surgery)  Indicate any recent Medical Services you may have received from other than [facility] providers in the past year (date may be approximate).     Assessment:   This is a routine wellness examination for Benjamin Powell.  Hearing/Vision screen No results found.  Dietary issues and exercise activities discussed: Current Exercise Habits: The patient does not participate in regular exercise at present   Goals Addressed   None   Depression Screen    10/23/2021    9:37 AM 09/22/2017    8:29 AM  PHQ 2/9 Scores  PHQ - 2 Score 0 0  PHQ- 9 Score  4    Fall Risk    10/23/2021    9:43 AM 08/06/2021    2:46 PM 03/10/2017    4:26 PM  [place] in the past year? 1 1 Yes  Number falls in past yr: '1 1 1  '$ Injury with Fall? 1 1 No  Risk for fall due to : History of fall(s);Mental status change History of fall(s);Impaired balance/gait Other (Comment)  Follow up Falls evaluation completed Falls evaluation completed     FALL RISK PREVENTION PERTAINING TO THE HOME:  Any stairs in or around the home? Yes  If so,  are there any without handrails? No  Home free of loose throw rugs in walkways, pet beds, electrical cords, etc? No  Adequate lighting in your home to reduce risk of falls? Yes    ASSISTIVE DEVICES UTILIZED TO PREVENT FALLS:  Life alert? No  Use of a cane, walker or w/c? No  Grab bars in the bathroom? Yes  Shower chair or bench in shower? No  Elevated toilet seat or a handicapped toilet? No   TIMED UP AND GO:  Was the test performed?  n/a .  Length of time to ambulate 10 feet: n/a sec.     Cognitive Function:        10/23/2021    9:45 AM  6CIT Screen  What Year? 0 points  What month? 0 points  What time? 0 points  Count back from 20 0 points  Months in reverse 2 points  Repeat phrase 10 points  Total Score 12 points    Immunizations Immunization History  Administered Date(s) Administered   Influenza, High Dose Seasonal PF 03/27/2017, 03/16/2018   Pneumococcal Conjugate-13 10/11/2013   Pneumococcal Polysaccharide-23 03/04/2016   Tdap 12/02/2011, 10/22/2021   Zoster, Live 01/27/2012    TDAP status: Up to date  Flu Vaccine status: Up to date  Pneumococcal vaccine status: Up to date  Covid-19 vaccine status: Completed vaccines  Qualifies for Shingles Vaccine? Yes   Zostavax completed Yes   Shingrix Completed?: No.    Education has been provided regarding the importance of this vaccine. Patient has been advised to call insurance company to determine out of pocket expense if they have not yet received this vaccine. Advised may also receive vaccine at local pharmacy or Health Dept. Verbalized acceptance and understanding.  Screening Tests Health Maintenance  Topic Date Due   Hepatitis C Screening  Never done   Zoster Vaccines- Shingrix (1 of 2) Never done   URINE MICROALBUMIN  01/18/2015   COVID-19 Vaccine (1) 10/24/2021 (Originally 04/11/1946)   HEMOGLOBIN A1C  11/10/2021   INFLUENZA VACCINE  12/07/2021   COLONOSCOPY (Pts 45-80yr Insurance coverage will need to be confirmed)  04/07/2022   FOOT EXAM  04/08/2022   OPHTHALMOLOGY EXAM  04/08/2022   TETANUS/TDAP  10/23/2031   Pneumonia Vaccine 75 Years old  Completed   HPV VACCINES   Aged Out    Health Maintenance  Health Maintenance Due  Topic Date Due   Hepatitis C Screening  Never done   Zoster Vaccines- Shingrix (1 of 2) Never done   URINE MICROALBUMIN  01/18/2015    Colorectal cancer screening: Type of screening: Colonoscopy. Completed 04/08/2019. Repeat every 3 years  Lung Cancer Screening: (Low Dose CT Chest recommended if Age 75-80years, 30 pack-year currently smoking OR have quit w/in 15years.) does not qualify.   Lung Cancer Screening Referral: n/a  Additional Screening:  Hepatitis C Screening: does qualify; Completed n/a  Vision Screening: Recommended annual ophthalmology exams for early detection of glaucoma and other disorders of the eye. Is the patient up to date with their annual eye exam?  Yes  Who is the provider or what is the name of the office in which the patient attends annual eye exams? N/a If pt is not established with a provider, would they like to be referred to a provider to establish care? No .   Dental Screening: Recommended annual dental exams for proper oral hygiene  Community Resource Referral / Chronic Care Management: CRR required this visit?  Yes   CCM required this visit?  No      Plan:     I have personally reviewed and noted the following in the patient's chart:   Medical and social history Use of alcohol, tobacco or illicit drugs  Current medications and supplements including opioid prescriptions. Patient is not currently taking opioid prescriptions. Functional ability and status Nutritional status Physical activity Advanced directives List of other physicians Hospitalizations, surgeries, and ER visits in previous 12 months Vitals Screenings to include cognitive, depression, and falls Referrals and appointments  In addition, I have reviewed and discussed with patient certain preventive protocols, quality metrics, and best practice recommendations. A written personalized care plan for preventive services  as well as general preventive health recommendations were provided to patient.     Octaviano Glow, CMA   10/23/2021   Nurse Notes: Non-Face to Face or Face to Face 10 minute visit Encounter    Benjamin Powell , Thank you for taking time to come for your Medicare Wellness Visit. I appreciate your ongoing commitment to your health goals. Please review the following plan we discussed and let me know if I can assist you in the future.   These are the goals we discussed:  Goals   None     This is a list of the screening recommended for you and due dates:  Health Maintenance  Topic Date Due   Hepatitis C Screening: USPSTF Recommendation to screen - Ages 39-79 yo.  Never done   Zoster (Shingles) Vaccine (1 of 2) Never done   Urine Protein Check  01/18/2015   COVID-19 Vaccine (1) 10/24/2021*   Hemoglobin A1C  11/10/2021   Flu Shot  12/07/2021   Colon Cancer Screening  04/07/2022   Complete foot exam   04/08/2022   Eye exam for diabetics  04/08/2022   Tetanus Vaccine  10/23/2031   Pneumonia Vaccine  Completed   HPV Vaccine  Aged Out  *Topic was postponed. The date shown is not the original due date.

## 2021-10-25 ENCOUNTER — Telehealth: Payer: Self-pay

## 2021-10-25 NOTE — Telephone Encounter (Signed)
   Telephone encounter was:  Unsuccessful.  10/25/2021 Name: Benjamin Powell MRN: [id number] DOB: [birth year]  Unsuccessful outbound call made today to assist with:  Food Insecurity  Outreach Attempt:  1st Attempt  A HIPAA compliant voice message was left requesting a return call.  Instructed patient to call back at [phone] at their earliest convenience.  [facility] management  [place], [place]  Main Phone: [phone]  E-mail: [email or web address]  Website: [email or web address]

## 2021-10-26 ENCOUNTER — Ambulatory Visit (HOSPITAL_BASED_OUTPATIENT_CLINIC_OR_DEPARTMENT_OTHER): Admission: RE | Admit: 2021-10-26 | Payer: Medicare HMO | Source: Ambulatory Visit

## 2021-10-27 ENCOUNTER — Telehealth: Payer: Self-pay

## 2021-10-27 NOTE — Telephone Encounter (Signed)
Spoke with Triage and they informed me of wife's concern for patient. Triage stated they have tried several times to reach patient without success. I have tried reaching patient as well without success. Lmom asking that patient or wife return call to the office. We will try contacting patient again.

## 2021-10-27 NOTE — Telephone Encounter (Addendum)
Contacted Sheriff's department to do a wellness check on patient. Advised patient was not taking his medication and feeling suicidal per patient's wife. Confirmed what medications pt takes and wife's name asked. After wellness check complete, they would provide update. Spoke with [employer] and he stated pt appeared fine. He asked the pt if he had thoughts or actions of wanting to hurt himself, pt denied. Pt took his medications with no issues. Nothing further needed.   Sent as Juluis Rainier

## 2021-10-27 NOTE — Telephone Encounter (Signed)
Pt wife called back and stated patient has not taken his medication and his glucose meter is broken so she is unsure of what his diabetes range is. Occupational therapy arrived but patient refused to open door. Wife arrived to open the door for occupational therapy.   Pt son passed away 1 year ago and he has had a hard time dealing with it. Wife states patient is distant and not communicating much.   Gani with occupational therapy called to inform that he saw patient today and spoke with patient wife. Patient wife informed Amanda Cockayne of the same concerns as she's expressed with PCP office.   Pt wife is going back to work but is very concerned about patient being alone as she feels patient will harm himself. Pt has not currently acted upon harmful thoughts outside of not taking his medications. Patient is refusing to go to the ED.   Dr. Idelle Leech CMA Vevelyn Royals) has contacted [place] for [facility] asking that they do a wellness check on patient.

## 2021-10-27 NOTE — Telephone Encounter (Addendum)
Noted.  [facility] - Client TELEPHONE ADVICE RECORD AccessNurse Patient Name: Benjamin Powell [facility] Gender: Male DOB: Sep 18, 1945 Age: 76 Y 18 D Return Phone Number: [phone] (Primary), [phone] (Secondary) Address: City/ State/ [place]  [address] Client [place] [facility] - Client Client Site [place] - Day Provider Crissie Sickles - MD Contact Type Call Who Is Calling Patient / Member / Family / Caregiver Call Type Triage / Clinical Caller Name [name] Relationship To Patient Spouse Return Phone Number [phone] (Primary) Chief Complaint SUICIDE - threatening harm to self or others Reason for Call Symptomatic / Request for [place] states her husband has diabetes. Symptoms included he wants to kill himself due to condition. He is not trying to take care of himself Translation No No Triage Reason Other Nurse Assessment Nurse: Roselie Awkward, RN, Heather Date/Time (Eastern Time): 10/27/2021 8:33:59 AM Confirm and document reason for call. If symptomatic, describe symptoms. ---Caller states patient is very depressed and not taking care of his Diabetes. Caller not with patient currently. Caller unable to continue call. Does the patient have any new or worsening symptoms? ---Yes Will a triage be completed? ---No Select reason for no triage. ---Other Please document clinical information provided and list any resource used. ---caller not with patient currently and caller has to end call due to needing to go back to work Disp. Time Eilene Ghazi Time) Disposition Final User 10/27/2021 8:27:43 AM Send to Urgent Clovis Cao, Royal 10/27/2021 8:35:57 AM Attempt made - message left Amadeo Garnet 10/27/2021 8:49:51 AM Attempt made - message left Amadeo Garnet 10/27/2021 9:02:49 AM Clinical Call Yes Roselie Awkward, RN, Heather Comments User: Mauri Pole, RN  Date/Time Eilene Ghazi Time): 10/27/2021 8:32:42 AM PLEASE NOTE: All timestamps contained within this report are represented as [place] Standard Time. CONFIDENTIALTY NOTICE: This fax transmission is intended only for the addressee. It contains information that is legally privileged, confidential or otherwise protected from use or disclosure. If you are not the intended recipient, you are strictly prohibited from reviewing, disclosing, copying using or disseminating any of this information or taking any action in reliance on or regarding this information. If you have received this fax in error, please notify us immediately by telephone so that we can arrange for its return to [place]. Phone: [phone], Toll-Free: [phone], Fax: [phone] Page: 2 of 2 Call Id: [id number] Comments caller states she can not stay on the phone, she had to go back to work. Patient's phone number [phone] User: Mauri Pole, RN Date/Time Eilene Ghazi Time): 10/27/2021 9:02:35 AM made office aware of symptoms/ situation and that I was unable to reach patient. User: Mauri Pole, RN Date/Time Eilene Ghazi Time): 10/27/2021 9:03:44 AM no address to send wellness check. Unable to reach patient. Minimal information received from caller. Office notified

## 2021-10-27 NOTE — Telephone Encounter (Signed)
Patient wife Benjamin Powell [facility]) called to see if our office can send someone to sit with patient at home.  She is very concerned for his safety.  Wife states she is at work, and does not feel as he should by himself.  I told her we could send someone to sit with him, that would have to go Home Health services.  She states he is very depressed, wants to "leave this life, he would rather die."  He is not taking his medication nor is he willing to take care of himself. Wife stated that he has several medical conditions.  I sent to Daytime Triage Nurse.

## 2021-10-28 ENCOUNTER — Telehealth: Payer: Self-pay

## 2021-10-28 NOTE — Telephone Encounter (Signed)
Yes ok 

## 2021-10-28 NOTE — Telephone Encounter (Signed)
Noted! Thank you

## 2021-10-28 NOTE — Telephone Encounter (Signed)
Okay for orders? 

## 2021-10-28 NOTE — Telephone Encounter (Signed)
Caller/Agency: Lauren @ [facility] Number: [phone]  Requesting OT/PT/Skilled Nursing/Social Work/Speech  Therapy: PT  Frequency:  2 times weekly for 6 weeks

## 2021-10-29 NOTE — Telephone Encounter (Signed)
Spoke with Vernona Rieger regarding results/recommendations,voiced understanding.

## 2021-11-01 ENCOUNTER — Ambulatory Visit: Payer: Medicare HMO | Admitting: Psychologist

## 2021-11-01 ENCOUNTER — Telehealth: Payer: Self-pay

## 2021-11-05 ENCOUNTER — Ambulatory Visit (HOSPITAL_BASED_OUTPATIENT_CLINIC_OR_DEPARTMENT_OTHER)
Admission: RE | Admit: 2021-11-05 | Discharge: 2021-11-05 | Disposition: A | Discharge status: Home / Self Care | Payer: Medicare HMO | Source: Ambulatory Visit | Attending: Internal Medicine | Admitting: Internal Medicine

## 2021-11-05 ENCOUNTER — Other Ambulatory Visit: Payer: Self-pay | Admitting: Family Medicine

## 2021-11-05 ENCOUNTER — Other Ambulatory Visit (HOSPITAL_BASED_OUTPATIENT_CLINIC_OR_DEPARTMENT_OTHER): Payer: Medicare HMO

## 2021-11-05 DIAGNOSIS — I4891 Unspecified atrial fibrillation: Secondary | ICD-10-CM

## 2021-11-05 NOTE — Progress Notes (Signed)
  Echocardiogram 2D Echocardiogram has been performed.  Benjamin Powell F 11/05/2021, 11:32 AM

## 2021-11-06 ENCOUNTER — Telehealth: Payer: Self-pay | Admitting: Internal Medicine

## 2021-11-06 NOTE — Telephone Encounter (Signed)
Received messages from patient's wife tonight. Called back and she told me the patient fell twice today. She kept checking his BP and HR. Reports his DBP was "everywhere", and his HR ranged from 55 to 88bpm. She is concerned about a lot of things, fall, blood pressure, heart rate, atrial fibrillation, recent medications changes, etc. I suggested his wife to take the patient to emergency room for further evaluation and management. Patient's wife verbally understood.

## 2021-11-08 ENCOUNTER — Telehealth: Payer: Self-pay

## 2021-11-08 ENCOUNTER — Encounter: Payer: Self-pay | Admitting: Physician Assistant

## 2021-11-08 ENCOUNTER — Telehealth: Payer: Self-pay | Admitting: Internal Medicine

## 2021-11-08 ENCOUNTER — Ambulatory Visit (INDEPENDENT_AMBULATORY_CARE_PROVIDER_SITE_OTHER): Payer: Medicare HMO | Admitting: Physician Assistant

## 2021-11-08 VITALS — BP 141/84 | HR 72 | Ht 72.0 in | Wt 217.0 lb

## 2021-11-08 DIAGNOSIS — R42 Dizziness and giddiness: Secondary | ICD-10-CM | POA: Diagnosis not present

## 2021-11-08 DIAGNOSIS — Z8673 Personal history of transient ischemic attack (TIA), and cerebral infarction without residual deficits: Secondary | ICD-10-CM

## 2021-11-08 DIAGNOSIS — I10 Essential (primary) hypertension: Secondary | ICD-10-CM | POA: Diagnosis not present

## 2021-11-08 DIAGNOSIS — I951 Orthostatic hypotension: Secondary | ICD-10-CM

## 2021-11-08 DIAGNOSIS — E119 Type 2 diabetes mellitus without complications: Secondary | ICD-10-CM

## 2021-11-08 DIAGNOSIS — I480 Paroxysmal atrial fibrillation: Secondary | ICD-10-CM

## 2021-11-08 LAB — ECHOCARDIOGRAM COMPLETE
AR max vel: 2.21 cm2
AV Area VTI: 2.36 cm2
AV Area mean vel: 2.03 cm2
AV Mean grad: 4 mmHg
AV Peak grad: 7 mmHg
Ao pk vel: 1.32 m/s
Area-P 1/2: 2.36 cm2
S' Lateral: 3.1 cm

## 2021-11-08 NOTE — Telephone Encounter (Signed)
   Telephone encounter was:  Unsuccessful.  11/08/2021 Name: Benjamin Powell MRN: [id number] DOB: 01/17/1946  Unsuccessful outbound call made today to assist with:  Food Insecurity  Outreach Attempt:  3rd Attempt.  Referral closed unable to contact patient.  CG called both patient and spouse's number per chart review and left messages for food insecurity.   A HIPAA compliant voice message was left requesting a return call.  Instructed patient to call back at [phone] at their earliest convenience.  [facility] management  [place], [place]  Main Phone: [phone]  E-mail: [email or web address]  Website: [email or web address]

## 2021-11-08 NOTE — Telephone Encounter (Signed)
STAT if patient feels like he/she is going to faint   Are you dizzy now? No  Do you feel faint or have you passed out?  No  Do you have any other symptoms?  No  Have you checked your HR and BP (record if available)?     Pt c/o BP issue: STAT if pt c/o blurred vision, one-sided weakness or slurred speech  1. What are your last 5 BP readings?   7/1 pm  158/106; HR 88 7/1 pm  170/99  HR 56 7/2 late last night  BP 137/83  HR 61 7/3  BP 130/73  HR 60's   2. Are you having any other symptoms (ex. Dizziness, headache, blurred vision, passed out)? No  3. What is your BP issue?     Wife stated patient fell on Saturday due to dizziness.  She stated he is on midodrine (PROAMATINE) 2.5 MG tablet She called the on-call doctor who told them to stop taking this medication.  Wife  stated patient's HR was fluctuating but started later Saturday evening.  Wife stated he is feeling unsteady on his feet.

## 2021-11-08 NOTE — Telephone Encounter (Signed)
-  Wife called to report pt has be feeling dizzy and real unsteady on his feet She report pt fell twice Saturday and HR was all over the place ranging between 53-88 -She stated they called on call physician and they recommended stopping midodrine. However, note indicated physician recommended ER evaluations. -Wife state pt refuses ER but she's concerned. -She state pt felt dizzy this morning but denies feeling like he's going to pass out or black out.     7/1 pm  158/106; HR 88 7/1 pm  170/99  HR 56 7/2 late last night  BP 137/83  HR 61 7/3  BP 130/73  HR 60's 146/84 65  Appointment scheduled for today 7/3 with Almyra Deforest, PA for further evaluations.

## 2021-11-08 NOTE — Progress Notes (Unsigned)
Cardiology Office Note:    Date:  11/10/2021   ID:  Benjamin Powell, DOB [birth year], MRN [id number]  PCP:  Tammi Sou, MD   [place] Providers Cardiologist:  Janina Mayo, MD     Referring MD: Tammi Sou, MD   Chief Complaint  Patient presents with   Follow-up    Seen for Dr. Harl Bowie    History of Present Illness:    Benjamin Powell is a 76 y.o. male with a hx of HTN, DM 2, CVA, OSA on CPAP, CKD stage III, orthostatic hypotension, DVT and PAF.  Patient was previously seen by Dr. Debara Pickett in 2018 for difficult to control blood pressure.  He was admitted and [facility] in January 2023 with fall and weakness.  CT of the brain showed remote infarct in the right basal ganglia and bilateral cerebral hemisphere, no acute infarct or lesion.  CTA of the neck showed less than 50% stenosis seen bilateral carotid artery.  No significant vertebral artery stenosis.  MRI of the brain showed a small amount of layering material in the occipital horn of the left lateral ventricle, compatible with proteinaceous debris.  Enhancing lesion in the left internal auditory canal measuring 5 x 4 mm suggestive of a schwannoma.  Echocardiogram obtained on 05/17/2021 showed EF 60 to 65%, trace MR.  There was no documented atrial fibrillation.  Heart monitor in late January was obtained, however result was not available.  A CT of the neck showed no obstructive carotid artery disease.  He has since completed another ZIO monitor in June 2023 that showed 2% burden of A-fib, he had an episode of A-fib that lasted 5 hours and 31-minute with average heart rate of 181 bpm.  Patient was seen on 10/21/2021, he was orthostatic that day was 26 mm drop in blood pressure upon standing, heart rate was in the 50s.  Metoprolol succinate was decreased to 12.5 mg daily from the previous to 50 mg.  Lisinopril discontinued.  Low-dose midodrine 2.5 mg 3 times a day was added.  Echocardiogram obtained on 11/05/2021  showed EF 60 to 65%, mild LVH, grade 1 DD, mild MR.  Patient was carrying his grocery from the car back to his home and was climbing 3 steps of stairs when he had sudden onset of dizziness and fell.  Blood pressure was 158/106, heart rate 88.  Repeat blood pressure was 170/99, heart rate 56.  His heart rate was never over 100 bpm.  He had 2 back-to-back episodes of dizziness.  He called the after-hours on-call service and was instructed to stop the midodrine.  He has since been doing well.  He still have occasional dizziness that only lasted a second at the time.  However no severe dizziness enough to make him fall again.  I suspect his recent symptom is worsened by dehydration.  I recommended at least 32 to 64 ounces of fluid per day.   Past Medical History:  Diagnosis Date   Anxiety and depression    BPH without obstruction/lower urinary tract symptoms    Cervical spondylosis    Surgery 2021   Chronic renal insufficiency, stage 3 (moderate) (HCC)    Frequent falls    GERD (gastroesophageal reflux disease)    Hemorrhoids    History of adenomatous polyp of colon    History of CVA (cerebrovascular accident) 10/2016   CTA head and neck with no occlusive dz. +advanced chronic ischemic dz changes on MR   History of  DVT (deep vein thrombosis) 2022   L leg.  Hypercoag w/u NEG.   Hypertension    Treatment resistant   Impaired mobility and ADLs    OSA on CPAP    PAF (paroxysmal atrial fibrillation) (HCC)    Recurrent nephrolithiasis    Type 2 diabetes with nephropathy [facility])    endocrinologist   Urge incontinence     Past Surgical History:  Procedure Laterality Date   ANKLE SURGERY  1993   left   ANTERIOR CERVICAL DECOMP/DISCECTOMY FUSION N/A 10/02/2019   Procedure: ANTERIOR CERVICAL DECOMPRESSION FUSION CERVICAL THREE-FOUR WITH INSTRUMENTATION AND ALLOGRAFT;  Surgeon: Phylliss Bob, MD;  Location: [place];  Service: Orthopedics;  Laterality: N/A;   CATARACT EXTRACTION Right     CHOLECYSTECTOMY  1990   COLONOSCOPY     2013 multiple hyperplastic polyps. 04/08/19 Many hyperplastic/inflammatory polyps, adenoma x1. Recall nov 2023   ERCP  1990   Hammer Toe Repair Right 09/16/2016   RT #5   Partial Excision Phalanx Left 09/16/2016   LT#5   rib fixation  05/2021   TONSILLECTOMY AND ADENOIDECTOMY  1998   and sinus for sleep apnea   TRANSTHORACIC ECHOCARDIOGRAM  07/07/2017   07/2017 EF 55-60%, mild MR, no shunt. 05/2021 EF normal, valves nl    Current Medications: Current Meds  Medication Sig   AMBULATORY NON FORMULARY MEDICATION Medication Name: Aloe Gold probiotic daily   apixaban (ELIQUIS) 5 MG TABS tablet Take 5 mg by mouth 2 (two) times daily.   aspirin 81 MG EC tablet Take 1 tablet by mouth daily.   BD PEN NEEDLE NANO 2ND GEN 32G X 4 MM MISC 4 (four) times daily. as directed   Cholecalciferol 125 MCG (5000 UT) TABS Take by mouth.   Continuous Blood Gluc Sensor (DEXCOM G6 SENSOR) MISC by Does not apply route.   HUMALOG KWIKPEN 100 UNIT/ML KwikPen SMARTSIG:10 Unit(s) SUB-Q   insulin glargine, 2 Unit Dial, (TOUJEO MAX SOLOSTAR) 300 UNIT/ML Solostar Pen Inject into the skin.   metoprolol succinate (TOPROL XL) 25 MG 24 hr tablet Take 0.5 tablets (12.5 mg total) by mouth daily. Take with or immediately following a meal.   OZEMPIC, 0.25 OR 0.5 MG/DOSE, 2 MG/3ML SOPN Inject into the skin.   sertraline (ZOLOFT) 100 MG tablet Take 1 tablet (100 mg total) by mouth daily.     Allergies:   Amlodipine   Social History   Socioeconomic History   Marital status: Married    Spouse name: Not on file   Number of children: 2   Years of education: Not on file   Highest education level: Not on file  Occupational History   Not on file  Tobacco Use   Smoking status: Former    Packs/day: 0.30    Years: 50.00    Total pack years: 15.00    Types: Cigarettes    Quit date: 08/08/2019    Years since quitting: 2.2   Smokeless tobacco: Never  Vaping Use   Vaping Use: Never  used  Substance and Sexual Activity   Alcohol use: No   Drug use: No   Sexual activity: Not on file  Other Topics Concern   Not on file  Social History Narrative   Married   Educ: college   Occup:    52 pack-yr tobacco, quit 2022.   No alc/drugs.   Social Determinants of Health   Financial Resource Strain: Low Risk  (10/23/2021)   Overall Financial Resource Strain (CARDIA)    Difficulty of  Paying Living Expenses: Not very hard  Food Insecurity: Food Insecurity Present (10/23/2021)   Hunger Vital Sign    Worried About Running Out of Food in the Last Year: Often true    Ran Out of Food in the Last Year: Often true  Transportation Needs: No Transportation Needs (10/23/2021)   PRAPARE - Hydrologist (Medical): No    Lack of Transportation (Non-Medical): No  Physical Activity: Inactive (10/23/2021)   Exercise Vital Sign    Days of Exercise per Week: 0 days    Minutes of Exercise per Session: 0 min  Stress: Stress Concern Present (10/23/2021)   [place]    Feeling of Stress : Very much  Social Connections: Socially Isolated (10/23/2021)   Social Connection and Isolation Panel [NHANES]    Frequency of Communication with Friends and Family: Never    Frequency of Social Gatherings with Friends and Family: Never    Attends Religious Services: Never    Marine scientist or Organizations: No    Attends Music therapist: Never    Marital Status: Married     Family History: The patient's family history includes Cancer in his brother and father; Colitis in his brother and sister; Diabetes in his brother and sister; Early death in his father; Heart attack in his brother; Pancreatic cancer in his mother; Stroke in his brother and brother. There is no history of Colon cancer, Esophageal cancer, Rectal cancer, or Stomach cancer.  ROS:   Please see the history of present illness.      All other systems reviewed and are negative.  EKGs/Labs/Other Studies Reviewed:    The following studies were reviewed today:  Echo 11/05/2021  1. Left ventricular ejection fraction, by estimation, is 60 to 65%. The  left ventricle has normal function. The left ventricle has no regional  wall motion abnormalities. There is mild left ventricular hypertrophy.  Left ventricular diastolic parameters  are consistent with Grade I diastolic dysfunction (impaired relaxation).   2. Right ventricular systolic function is normal. The right ventricular  size is normal. There is normal pulmonary artery systolic pressure.   3. The mitral valve is normal in structure. Mild mitral valve  regurgitation. No evidence of mitral stenosis.   4. The aortic valve is normal in structure. Aortic valve regurgitation is  not visualized. No aortic stenosis is present.   5. The inferior vena cava is normal in size with greater than 50%  respiratory variability, suggesting right atrial pressure of 3 mmHg.   EKG:  EKG is not ordered today.    Recent Labs: 08/06/2021: ALT 33; BUN 23; Creat 1.32; Hemoglobin 16.0; Platelets 211; Potassium 4.5; Sodium 140; TSH 1.43  Recent Lipid Panel    Component Value Date/Time   CHOL 99 09/22/2017 0912   TRIG 139 09/22/2017 0912   HDL 36 (L) 09/22/2017 0912   CHOLHDL 2.8 09/22/2017 0912   VLDL 45 (H) 04/07/2016 1635   LDLCALC 41 09/22/2017 0912     Risk Assessment/Calculations:    CHA2DS2-VASc Score = 6   This indicates a 9.7% annual risk of stroke. The patient's score is based upon: CHF History: 0 HTN History: 1 Diabetes History: 1 Stroke History: 2 Vascular Disease History: 0 Age Score: 2 Gender Score: 0          Physical Exam:    VS:  BP (!) 141/84   Pulse 72   Ht 6' (1.829  m)   Wt 217 lb (98.4 kg)   SpO2 91%   BMI 29.43 kg/m     Wt Readings from Last 3 Encounters:  11/08/21 217 lb (98.4 kg)  10/22/21 209 lb 12.8 oz (95.2 kg)  10/21/21 209 lb  (94.8 kg)     GEN:  Well nourished, well developed in no acute distress HEENT: Normal NECK: No JVD; No carotid bruits LYMPHATICS: No lymphadenopathy CARDIAC: RRR, no murmurs, rubs, gallops RESPIRATORY:  Clear to auscultation without rales, wheezing or rhonchi  ABDOMEN: Soft, non-tender, non-distended MUSCULOSKELETAL:  No edema; No deformity  SKIN: Warm and dry NEUROLOGIC:  Alert and oriented x 3 PSYCHIATRIC:  Normal affect   ASSESSMENT:    1. Dizziness   2. Primary hypertension   3. Controlled type 2 diabetes mellitus without complication, without long-term current use of insulin ([place])   4. Orthostatic hypotension   5. PAF (paroxysmal atrial fibrillation) ([place])   6. H/O: CVA (cerebrovascular accident)    PLAN:    In order of problems listed above:  Dizzy spell: Patient was recently diagnosed about orthostatic hypotension and was started on midodrine.  Last week, he was carrying groceries from his car back to his home and was climbing up a 3 steps of stairs when he had a sudden onset of dizziness.  Blood pressure was high and heart rate was  normal.  He called after hour answering service and was instructed to stop the midodrine.  Since then, he only had brief dizzy spell that lasted about a second however symptom has significantly improved.  Based on his presentation, I suspect his symptom is more related to dehydration.  His wife clearly says he is not drinking enough fluid.  I instructed the patient to drink between 32 to 64 ounces of fluid per day.  Given normal echocardiogram recently, I did not recommend additional work-up  Hypertension: Blood pressure elevated.  Recently diagnosed with orthostatic hypotension.  Will allow baseline blood pressure to be mildly elevated  DM2: Managed by primary care provider  Orthostatic hypotension: Recently diagnosed, previously noted to have 26 mmHg drop with change in the body position, due to significant dizzy spell last week, we repeated  orthostatic vital signs in the office which came back negative.  I suspect his recent symptom was more related to hypovolemia.  He appears to be more hydrated today.  Dizziness has resolved.  He contacted after hour answering service on the day he had a significant dizzy spell and had a near fall and was instructed to stop the midodrine.  Since his symptom has not significantly improved, I did not reinitiate midodrine.  PAF: Noted to have 2% A-fib burden on the recent heart monitor.  Continue Eliquis.  History of CVA: No recent recurrence.           Medication Adjustments/Labs and Tests Ordered: Current medicines are reviewed at length with the patient today.  Concerns regarding medicines are outlined above.  No orders of the defined types were placed in this encounter.  No orders of the defined types were placed in this encounter.   Patient Instructions  Medication Instructions:  Your physician recommends that you continue on your current medications as directed. Please refer to the Current Medication list given to you today.  *If you need a refill on your cardiac medications before your next appointment, please call your pharmacy*   Lab Work: NONE ordered at this time of appointment   If you have labs (blood work) drawn today  and your tests are completely normal, you will receive your results only by: MyChart Message (if you have MyChart) OR A paper copy in the mail If you have any lab test that is abnormal or we need to change your treatment, we will call you to review the results.  Testing/Procedures: NONE ordered at this time of appointment   Follow-Up: At [facility], you and your health needs are our priority.  As part of our continuing mission to provide you with exceptional heart care, we have created designated Provider Care Teams.  These Care Teams include your primary Cardiologist (physician) and Advanced Practice Providers (APPs -  Physician Assistants and Nurse  Practitioners) who all work together to provide you with the care you need, when you need it.     Your next appointment:   As previously scheduled    The format for your next appointment:   In Person  Provider:   Janina Mayo, MD     Other Instructions Make sure you are drinking plenty of liquids in a day. Drink anywhere from 32 oz -64  Medication Samples have been provided to the patient.  Drug name: Eliquis        Strength: 2.5        Qty: 1 box   LOT: [id number]  Exp.Date: 06/2022  Dosing instructions: 5 mg 2 times a day   The patient has been instructed regarding the correct time, dose, and frequency of taking this medication, including desired effects and most common side effects.   Jacqulynn Cadet 3:15 PM 11/08/2021   Important Information About Sugar         Hilbert Corrigan, [place]  11/10/2021 11:35 PM    [place]

## 2021-11-08 NOTE — Patient Instructions (Signed)
Medication Instructions:  Your physician recommends that you continue on your current medications as directed. Please refer to the Current Medication list given to you today.  *If you need a refill on your cardiac medications before your next appointment, please call your pharmacy*   Lab Work: NONE ordered at this time of appointment   If you have labs (blood work) drawn today and your tests are completely normal, you will receive your results only by: [place] (if you have MyChart) OR A paper copy in the mail If you have any lab test that is abnormal or we need to change your treatment, we will call you to review the results.  Testing/Procedures: NONE ordered at this time of appointment   Follow-Up: At [facility], you and your health needs are our priority.  As part of our continuing mission to provide you with exceptional heart care, we have created designated Provider Care Teams.  These Care Teams include your primary Cardiologist (physician) and Advanced Practice Providers (APPs -  Physician Assistants and Nurse Practitioners) who all work together to provide you with the care you need, when you need it.     Your next appointment:   As previously scheduled    The format for your next appointment:   In Person  Provider:   Janina Mayo, MD     Other Instructions Make sure you are drinking plenty of liquids in a day. Drink anywhere from 32 oz -64  Medication Samples have been provided to the patient.  Drug name: Eliquis        Strength: 2.5        Qty: 1 box   LOT: [id number]  Exp.Date: 06/2022  Dosing instructions: 5 mg 2 times a day   The patient has been instructed regarding the correct time, dose, and frequency of taking this medication, including desired effects and most common side effects.   Benjamin Powell 3:15 PM 11/08/2021   Important Information About Sugar

## 2021-11-08 NOTE — Progress Notes (Signed)
Don't bill

## 2021-11-09 ENCOUNTER — Other Ambulatory Visit: Payer: Self-pay | Admitting: Family Medicine

## 2021-11-10 ENCOUNTER — Encounter: Payer: Self-pay | Admitting: Physician Assistant

## 2021-11-11 ENCOUNTER — Ambulatory Visit: Payer: Medicare HMO | Admitting: Family Medicine

## 2021-11-11 ENCOUNTER — Emergency Department
Admission: EM | Admit: 2021-11-11 | Discharge: 2021-11-11 | Disposition: A | Discharge status: Home / Self Care | Payer: Medicare HMO | Source: Home / Self Care

## 2021-11-11 DIAGNOSIS — R300 Dysuria: Secondary | ICD-10-CM | POA: Diagnosis not present

## 2021-11-11 LAB — POCT URINALYSIS DIP (MANUAL ENTRY)
Bilirubin, UA: NEGATIVE
Blood, UA: NEGATIVE
Glucose, UA: 500 mg/dL — AB
Ketones, POC UA: NEGATIVE mg/dL
Leukocytes, UA: NEGATIVE
Nitrite, UA: NEGATIVE
Protein Ur, POC: NEGATIVE mg/dL
Spec Grav, UA: 1.015 (ref 1.010–1.025)
Urobilinogen, UA: 0.2 E.U./dL
pH, UA: 5.5 (ref 5.0–8.0)

## 2021-11-11 NOTE — ED Provider Notes (Signed)
[name]    CSN: [id number] Arrival date & time: 11/11/21  1258      History   Chief Complaint Chief Complaint  Patient presents with   Dysuria    HPI Benjamin Powell is a 76 y.o. male.   HPI 76 year old male presents with dysuria for approximately 6 months.  Reports occasional urinary hesitancy/retention.  PMH significant for BPH without obstruction/lower urinary tract symptoms, chronic renal insufficiency, stage III and history of CVA.  Past Medical History:  Diagnosis Date   Anxiety and depression    BPH without obstruction/lower urinary tract symptoms    Cervical spondylosis    Surgery 2021   Chronic renal insufficiency, stage 3 (moderate) (HCC)    Frequent falls    GERD (gastroesophageal reflux disease)    Hemorrhoids    History of adenomatous polyp of colon    History of CVA (cerebrovascular accident) 10/2016   CTA head and neck with no occlusive dz. +advanced chronic ischemic dz changes on MR   History of DVT (deep vein thrombosis) 2022   L leg.  Hypercoag w/u NEG.   Hypertension    Treatment resistant   Impaired mobility and ADLs    OSA on CPAP    PAF (paroxysmal atrial fibrillation) (HCC)    Recurrent nephrolithiasis    Type 2 diabetes with nephropathy [facility])    endocrinologist   Urge incontinence     Patient Active Problem List   Diagnosis Date Noted   Other dental procedure status 03/06/2020   Myelopathy ([place]) 10/02/2019   Acute diarrhea 02/05/2019   Chronic diarrhea 02/05/2019   Chronic fatigue 10/06/2017   Tobacco abuse 10/06/2017   Hand sprain, left, initial encounter 09/22/2017   Hand injuries, left, initial encounter 09/11/2017   Closed head injury 09/11/2017   Fall in elderly patient 09/11/2017   History of stroke 02/03/2017   Stroke (cerebrum) ([place]) 10/30/2016   GERD (gastroesophageal reflux disease) 04/15/2016   Depression 06/19/2015   Hypogonadism in male 04/21/2014   Degenerative disc disease, cervical 01/31/2014    Primary osteoarthritis of both knees 01/31/2014   Obstructive sleep apnea 08/05/2013   Posterior vitreous detachment, left eye 01/18/2013   Microalbuminuric diabetic nephropathy ([place]) 01/03/2013   Spondylosis of cervical region without myelopathy or radiculopathy 12/19/2011   Lumbar degenerative disc disease 12/19/2011   Hypertension 12/01/2011   Type 2 diabetes mellitus ([place]) 12/01/2011    Past Surgical History:  Procedure Laterality Date   ANKLE SURGERY  1993   left   ANTERIOR CERVICAL DECOMP/DISCECTOMY FUSION N/A 10/02/2019   Procedure: ANTERIOR CERVICAL DECOMPRESSION FUSION CERVICAL THREE-FOUR WITH INSTRUMENTATION AND ALLOGRAFT;  Surgeon: Phylliss Bob, MD;  Location: [facility];  Service: Orthopedics;  Laterality: N/A;   CATARACT EXTRACTION Right    CHOLECYSTECTOMY  1990   COLONOSCOPY     2013 multiple hyperplastic polyps. 04/08/19 Many hyperplastic/inflammatory polyps, adenoma x1. Recall nov 2023   ERCP  1990   Hammer Toe Repair Right 09/16/2016   RT #5   Partial Excision Phalanx Left 09/16/2016   LT#5   rib fixation  05/2021   TONSILLECTOMY AND ADENOIDECTOMY  1998   and sinus for sleep apnea   TRANSTHORACIC ECHOCARDIOGRAM  07/07/2017   07/2017 EF 55-60%, mild MR, no shunt. 05/2021 EF normal, valves nl       Home Medications    Prior to Admission medications   Medication Sig Start Date End Date Taking? Authorizing Provider  AMBULATORY NON FORMULARY MEDICATION Medication Name: Aloe Gold probiotic daily  [provider]  apixaban (ELIQUIS) 5 MG TABS tablet Take 5 mg by mouth 2 (two) times daily. 12/21/20   [provider]  ascorbic acid (VITAMIN C) 1000 MG tablet Take 1 tablet by mouth daily. Patient not taking: Reported on 10/08/2021    [provider]  aspirin 81 MG EC tablet Take 1 tablet by mouth daily.    [provider]  BD PEN NEEDLE NANO 2ND GEN 32G X 4 MM MISC 4 (four) times daily. as directed 03/16/21   [provider]   Cholecalciferol 125 MCG (5000 UT) TABS Take by mouth.    [provider]  Continuous Blood Gluc Sensor (DEXCOM G6 SENSOR) MISC by Does not apply route. 09/13/21   [provider]  HUMALOG KWIKPEN 100 UNIT/ML KwikPen SMARTSIG:10 Unit(s) SUB-Q 02/08/21   [provider]  insulin glargine, 2 Unit Dial, (TOUJEO MAX SOLOSTAR) 300 UNIT/ML Solostar Pen Inject into the skin. 06/14/21   [provider]  metoprolol succinate (TOPROL XL) 25 MG 24 hr tablet Take 0.5 tablets (12.5 mg total) by mouth daily. Take with or immediately following a meal. 10/21/21   Branch, Royetta Crochet, MD  OZEMPIC, 0.25 OR 0.5 MG/DOSE, 2 MG/3ML SOPN Inject into the skin. 09/13/21   [provider]  sertraline (ZOLOFT) 100 MG tablet Take 1 tablet (100 mg total) by mouth daily. 09/10/21   McGowen, Adrian Blackwater, MD    Family History Family History  Problem Relation Age of Onset   Pancreatic cancer Mother    Cancer Father    Early death Father    Colitis Sister    Diabetes Sister    Stroke Brother    Colitis Brother    Diabetes Brother    Heart attack Brother    Cancer Brother    Stroke Brother    Colon cancer Neg Hx    Esophageal cancer Neg Hx    Rectal cancer Neg Hx    Stomach cancer Neg Hx     Social History Social History   Tobacco Use   Smoking status: Former    Packs/day: 0.30    Years: 50.00    Total pack years: 15.00    Types: Cigarettes    Quit date: 08/08/2019    Years since quitting: 2.2   Smokeless tobacco: Never  Vaping Use   Vaping Use: Never used  Substance Use Topics   Alcohol use: No   Drug use: No     Allergies   Patient has no active allergies.   Review of Systems Review of Systems  Genitourinary:  Positive for dysuria.     Physical Exam Triage Vital Signs ED Triage Vitals  Enc Vitals Group     BP 11/11/21 1310 114/74     Pulse Rate 11/11/21 1310 61     Resp 11/11/21 1310 20     Temp 11/11/21 1310 (!) 97.4 F (36.3 C)     Temp Source 11/11/21  1310 Oral     SpO2 11/11/21 1310 96 %     Weight 11/11/21 1309 217 lb (98.4 kg)     Height 11/11/21 1309 6' (1.829 m)     Head Circumference --      Peak Flow --      Pain Score 11/11/21 1309 0     Pain Loc --      Pain Edu? --      Excl. in [place]? --    No data found.  Updated Vital Signs BP 114/74 (  BP Location: Right Arm)   Pulse 61   Temp (!) 97.4 F (36.3 C) (Oral)   Resp 20   Ht 6' (1.829 m)   Wt 217 lb (98.4 kg)   SpO2 96%   BMI 29.43 kg/m     Physical Exam Vitals and nursing note reviewed.  Constitutional:      Appearance: Normal appearance. He is normal weight.  HENT:     Head: Normocephalic and atraumatic.     Mouth/Throat:     Mouth: Mucous membranes are moist.     Pharynx: Oropharynx is clear.  Eyes:     Extraocular Movements: Extraocular movements intact.     Conjunctiva/sclera: Conjunctivae normal.     Pupils: Pupils are equal, round, and reactive to light.  Cardiovascular:     Rate and Rhythm: Normal rate and regular rhythm.     Pulses: Normal pulses.     Heart sounds: Normal heart sounds.  Pulmonary:     Effort: Pulmonary effort is normal.     Breath sounds: Normal breath sounds. No wheezing, rhonchi or rales.  Abdominal:     Tenderness: There is no right CVA tenderness or left CVA tenderness.  Musculoskeletal:     Cervical back: Normal range of motion and neck supple.  Skin:    General: Skin is warm and dry.  Neurological:     General: No focal deficit present.     Mental Status: He is alert and oriented to person, place, and time.      UC Treatments / Results  Labs (all labs ordered are listed, but only abnormal results are displayed) Labs Reviewed  POCT URINALYSIS DIP (MANUAL ENTRY) - Abnormal; Notable for the following components:      Result Value   Glucose, UA =500 (*)    All other components within normal limits  URINE CULTURE    EKG   Radiology No results found.  Procedures Procedures (including critical care  time)  Medications Ordered in UC Medications - No data to display  Initial Impression / Assessment and Plan / UC Course  I have reviewed the triage vital signs and the nursing notes.  Pertinent labs & imaging results that were available during my care of the patient were reviewed by me and considered in my medical decision making (see chart for details).     MDM: 1.  Dysuria-Advised patient/wife urinalysis was completely unremarkable and we will order urine culture.  Advised patient to increase daily water intake and we will follow-up with his urine culture results once received.  Encouraged patient/wife to follow-up with PCP/urology for further evaluation of current symptoms.  Patient discharged home, hemodynamically stable  Final Clinical Impressions(s) / UC Diagnoses   Final diagnoses:  Dysuria     Discharge Instructions      Advised patient/wife urinalysis was completely unremarkable and we will order urine culture.  Advised patient to increase daily water intake and we will follow-up with his urine culture results once received.  Encouraged patient/wife to follow-up with PCP/urology for further evaluation of current symptoms.     ED Prescriptions   None    PDMP not reviewed this encounter.   Eliezer Lofts, Lindsey 11/11/21 1526

## 2021-11-11 NOTE — Discharge Instructions (Addendum)
Advised patient/wife urinalysis was completely unremarkable and we will order urine culture.  Advised patient to increase daily water intake and we will follow-up with his urine culture results once received.  Encouraged patient/wife to follow-up with PCP/urology for further evaluation of current symptoms.

## 2021-11-11 NOTE — ED Triage Notes (Signed)
Pt presents to Urgent Care with c/o dysuria x approx 6 months. Also reports occasional urinary hesitancy/retention.

## 2021-11-14 ENCOUNTER — Telehealth: Payer: Self-pay | Admitting: Emergency Medicine

## 2021-11-14 DIAGNOSIS — R300 Dysuria: Secondary | ICD-10-CM

## 2021-11-14 LAB — URINE CULTURE
MICRO NUMBER:: 13615924
SPECIMEN QUALITY:: ADEQUATE

## 2021-11-14 MED ORDER — AMOXICILLIN 875 MG PO TABS: 875.0000 mg | ORAL_TABLET | Freq: Two times a day (BID) | ORAL | 0 refills | Status: AC

## 2021-11-14 NOTE — Telephone Encounter (Signed)
See result note - antibiotic sent in for UTI. Pt to start today

## 2021-11-14 NOTE — Progress Notes (Signed)
Patient called regarding urine culture results after Dr Assunta Found reviewed results. Amoxicillin '875mg'$  every 12 hours x 5 days. Prescription sent to [facility] in [place]. Please take medicine with food and get both doses in today. Pt to call GI doctor in am to update on UTI.  [facility] is scheduled for a colonoscopy on Tuesday. No other questions at this time

## 2021-11-18 DIAGNOSIS — Z86010 Personal history of colonic polyps: Secondary | ICD-10-CM | POA: Insufficient documentation

## 2021-11-19 ENCOUNTER — Ambulatory Visit: Payer: Medicare HMO | Admitting: Family Medicine

## 2021-11-26 ENCOUNTER — Encounter: Payer: Self-pay | Admitting: Family Medicine

## 2021-11-26 ENCOUNTER — Ambulatory Visit (INDEPENDENT_AMBULATORY_CARE_PROVIDER_SITE_OTHER): Payer: Medicare HMO | Admitting: Family Medicine

## 2021-11-26 VITALS — BP 130/80 | HR 65 | Temp 98.0°F | Ht 72.0 in | Wt 217.4 lb

## 2021-11-26 DIAGNOSIS — N401 Enlarged prostate with lower urinary tract symptoms: Secondary | ICD-10-CM | POA: Diagnosis not present

## 2021-11-26 DIAGNOSIS — F3341 Major depressive disorder, recurrent, in partial remission: Secondary | ICD-10-CM | POA: Diagnosis not present

## 2021-11-26 DIAGNOSIS — K529 Noninfective gastroenteritis and colitis, unspecified: Secondary | ICD-10-CM | POA: Diagnosis not present

## 2021-11-26 DIAGNOSIS — N138 Other obstructive and reflux uropathy: Secondary | ICD-10-CM

## 2021-11-26 MED ORDER — TADALAFIL 5 MG PO TABS: ORAL_TABLET | ORAL | 1 refills | Status: DC

## 2021-11-26 NOTE — Progress Notes (Signed)
OFFICE VISIT  11/26/2021  CC:  Chief Complaint  Patient presents with   Follow-up    depression    Patient is a 76 y.o. male who presents for 6-week follow-up depression. Upon our last evaluation for this he was improving after we had increased his sertraline to 200 mg a day.  INTERIM HX: Not too much is changed regarding his depressed mood.  He states mostly it is a lot of frustration with his ongoing problems with controlling his diabetes as well as his chronic urinary symptoms. He does admit that oftentimes he wonders what is the point of him being alive.  However he states that he has no desire to actually hurt himself.  On 11/11/2021 he was having dysuria so he presented to the emergency department.  Urinalysis was normal but urine culture came back growing Enterococcus faecalis.  It was sensitive to ampicillin so he was started on amoxicillin for 5 days. He states the symptoms are not too unusual for him, and seem to be more prominent in the last month or so.  Has nocturia x4 chronically.  Does have some daytime urinary frequency, occasional incomplete emptying as well as hesitancy.  No hematuria.  No history of kidney stones or urinary infection Says he was on medication for his prostate at some point in the past but does not recall the name of it.  He says it did not really help.  Said it did not cause any side effects, though. He thinks he saw a urologist at 1 point but he cannot be sure. He has had a problem with uncontrolled diabetes but says glucoses have been improved lately with insulin pump--mid 100s up to 200.  He underwent colonoscopy for functional diarrhea on 11/16/2021.  No results available to me at this time but he reports that it showed multiple polyps that sound like they were precancerous, otherwise unremarkable results. He has chronic loose stool, 6 to 7/day.  He does have a history of orthostatic hypotension as well as baseline chronic hypertension. Was on midodrine  at 1 point in the past  ROS as above, plus--> no fevers, no CP, no SOB, no wheezing, no cough, no dizziness, no HAs, no rashes, no melena/hematochezia. No myalgias or arthralgias.  No focal weakness, paresthesias, or tremors.  No acute vision or hearing abnormalities.   No recent changes in lower legs. No n/v/d or abd pain.  No palpitations.     Past Medical History:  Diagnosis Date   Anxiety and depression    BPH without obstruction/lower urinary tract symptoms    Cervical spondylosis    Surgery 2021   Chronic renal insufficiency, stage 3 (moderate) (HCC)    Frequent falls    GERD (gastroesophageal reflux disease)    Hemorrhoids    History of adenomatous polyp of colon    History of CVA (cerebrovascular accident) 10/2016   CTA head and neck with no occlusive dz. +advanced chronic ischemic dz changes on MR   History of DVT (deep vein thrombosis) 2022   L leg.  Hypercoag w/u NEG.   Hypertension    Treatment resistant   Impaired mobility and ADLs    Orthostatic hypotension    OSA on CPAP    PAF (paroxysmal atrial fibrillation) (HCC)    Recurrent nephrolithiasis    Type 2 diabetes with nephropathy [facility])    endocrinologist   Urge incontinence     Past Surgical History:  Procedure Laterality Date   [place]  left   ANTERIOR CERVICAL DECOMP/DISCECTOMY FUSION N/A 10/02/2019   Procedure: ANTERIOR CERVICAL DECOMPRESSION FUSION CERVICAL THREE-FOUR WITH INSTRUMENTATION AND ALLOGRAFT;  Surgeon: Phylliss Bob, MD;  Location: [facility];  Service: Orthopedics;  Laterality: N/A;   CATARACT EXTRACTION Right    CHOLECYSTECTOMY  1990   COLONOSCOPY     2013 multiple hyperplastic polyps. 04/08/19 Many hyperplastic/inflammatory polyps, adenoma x1. Recall nov 2023   ERCP  1990   Hammer Toe Repair Right 09/16/2016   RT #5   Partial Excision Phalanx Left 09/16/2016   LT#5   rib fixation  05/2021   TONSILLECTOMY AND ADENOIDECTOMY  1998   and sinus for sleep apnea   TRANSTHORACIC  ECHOCARDIOGRAM  07/07/2017   07/2017 EF 55-60%, mild MR, no shunt. 05/2021 EF normal, valves nl    Outpatient Medications Prior to Visit  Medication Sig Dispense Refill   AMBULATORY NON FORMULARY MEDICATION Medication Name: Aloe Gold probiotic daily     apixaban (ELIQUIS) 5 MG TABS tablet Take 5 mg by mouth 2 (two) times daily.     aspirin 81 MG EC tablet Take 1 tablet by mouth daily.     BD PEN NEEDLE NANO 2ND GEN 32G X 4 MM MISC 4 (four) times daily. as directed     Cholecalciferol 125 MCG (5000 UT) TABS Take by mouth.     Continuous Blood Gluc Sensor (DEXCOM G6 SENSOR) MISC by Does not apply route.     HUMALOG KWIKPEN 100 UNIT/ML KwikPen SMARTSIG:10 Unit(s) SUB-Q     insulin glargine, 2 Unit Dial, (TOUJEO MAX SOLOSTAR) 300 UNIT/ML Solostar Pen Inject into the skin.     metoprolol succinate (TOPROL XL) 25 MG 24 hr tablet Take 0.5 tablets (12.5 mg total) by mouth daily. Take with or immediately following a meal. 45 tablet 3   OZEMPIC, 0.25 OR 0.5 MG/DOSE, 2 MG/3ML SOPN Inject into the skin.     sertraline (ZOLOFT) 100 MG tablet Take 1 tablet (100 mg total) by mouth daily. 60 tablet 0   ascorbic acid (VITAMIN C) 1000 MG tablet Take 1 tablet by mouth daily. (Patient not taking: Reported on 10/08/2021)     budesonide (ENTOCORT EC) 3 MG 24 hr capsule Take 9 mg by mouth daily.     No facility-administered medications prior to visit.    No Active Allergies  ROS As per HPI  PE:    11/26/2021    2:27 PM 11/11/2021    1:10 PM 11/11/2021    1:09 PM  Vitals with BMI  Height '6\' 0"'$   '6\' 0"'$   Weight 217 lbs 6 oz  217 lbs  BMI 81.15  72.62  Systolic 035 597   Diastolic 80 74   Pulse 65 61      Physical Exam  Gen: Alert, well appearing.  Patient is oriented to person, place, time, and situation. AFFECT: pleasant, lucid thought and speech. No further exam today  LABS:  Last CBC Lab Results  Component Value Date   WBC 8.8 08/06/2021   HGB 16.0 08/06/2021   HCT 47.4 08/06/2021   MCV 92.2  08/06/2021   MCH 31.1 08/06/2021   RDW 12.6 08/06/2021   PLT 211 41/63/8453   Last metabolic panel Lab Results  Component Value Date   GLUCOSE 181 (H) 08/06/2021   NA 140 08/06/2021   K 4.5 08/06/2021   CL 104 08/06/2021   CO2 24 08/06/2021   BUN 23 08/06/2021   CREATININE 1.32 (H) 08/06/2021   GFRNONAA 38 (L) 09/27/2019  CALCIUM 9.9 08/06/2021   PROT 6.6 08/06/2021   ALBUMIN 4.0 09/27/2019   BILITOT 0.4 08/06/2021   ALKPHOS 69 09/27/2019   AST 14 08/06/2021   ALT 33 08/06/2021   ANIONGAP 12 09/27/2019   IMPRESSION AND PLAN:  #1 major depressive disorder, partial remission. Continue sertraline 200 mg a day. Her plan is to address some of the physical symptoms that have him so frustrated and hopefully this will alleviate some of his depression  #2 BPH with lower urinary tract obstructive symptoms. Start a trial of Cialis 5 mg a day.  He is not on any nitrates. Alpha blockers are relatively contraindicated due to his history of orthostatic hypotension. Consider finasteride trial in the future. May ultimately need to see urologist. His chronic hyperglycemia is complicating this and we discussed the importance of getting his sugars better today.  He is working hard with his endocrinologist and things seem to be improving some.  #3 chronic diarrhea.  Unknown etiology. He is followed by gastroenterology. Will ask their office for report of recent colonoscopy.  An After Visit Summary was printed and given to the patient.  FOLLOW UP: Return in about 4 weeks (around 12/24/2021) for depression.  Signed:  Crissie Sickles, MD           11/26/2021

## 2021-11-30 ENCOUNTER — Ambulatory Visit: Payer: Medicare HMO | Admitting: Internal Medicine

## 2021-12-01 ENCOUNTER — Telehealth: Payer: Self-pay | Admitting: Internal Medicine

## 2021-12-01 NOTE — Telephone Encounter (Signed)
Mailbox is full.

## 2021-12-01 NOTE — Telephone Encounter (Signed)
Pt wife returning a call abotu echo results. Per secure chat, Betha Loa, will call back.

## 2021-12-01 NOTE — Telephone Encounter (Addendum)
Informed wife that there are no new orders for BP. Wife stated, "I don't think Dr. Harl Bowie knows what she is talking about. My husband has elevated blood pressures and his pressures are all over the place." I reiterated to take blood pressure at least 2 hours after BP medication, not during the entire day. She stated she does not want her husband to have another stroke with his blood pressures or to have a heart attack." She then stated she doesn't believe the results of the echo and wants the heart valves evaluated more.  She is considering changing cardiologists and will let us know, but then stated, "[place] can go where I want." I offered for patient to come in to see APP, she refused. Wife continued to repeat her concerns over and over. We ended the call with her going to inform clinic of her decision to change physicians.

## 2021-12-01 NOTE — Telephone Encounter (Addendum)
Wife informed of echo results per Dr. Harl Bowie..."echo showed normal heart function. Normal for age. Only minor leaking of one of the valves." This was explained to wife. Wife stated patient had on and off lightheadedness. BP ranges from 146/84 to 158/106. P in the 80s. Wife taking BP every hour throughout the day. Explained that BP should be checked 2 hours after BP medications, and I as was explaining the procedure, she said, "I have been taking blood pressures for years. I just didn't know to check 2 hours after medications." She stated that patient does not have sob or chest pain. He is not drinking enough fluids and is not wearing support hose. Taking meds as prescribed. Please advise on BP.

## 2021-12-01 NOTE — Telephone Encounter (Deleted)
Attempted to call patient, left message for patient to call back to office.   

## 2021-12-01 NOTE — Telephone Encounter (Signed)
Wife called to follow-up on patient's result of Echo.  She noted they will not be available after 2pm today.

## 2021-12-10 ENCOUNTER — Telehealth: Payer: Self-pay

## 2021-12-10 NOTE — Telephone Encounter (Signed)
Yes, okay.

## 2021-12-10 NOTE — Telephone Encounter (Signed)
Okay for orders? 

## 2021-12-10 NOTE — Telephone Encounter (Signed)
Caller/Agency: Lauren @ [place] Number: [phone]  Requesting OT/PT/Skilled Nursing/Social Work/Speech Therapy: PT  Frequency:  Continue PT 2 times weekly for 3 weeks 1 time weekly for 6 weeks

## 2021-12-13 NOTE — Telephone Encounter (Signed)
LM for Lauren regarding PT approved orders.

## 2021-12-14 NOTE — Telephone Encounter (Signed)
LM for Lauren regarding PT approved orders.

## 2021-12-15 DIAGNOSIS — S22079D Unspecified fracture of T9-T10 vertebra, subsequent encounter for fracture with routine healing: Secondary | ICD-10-CM | POA: Diagnosis not present

## 2021-12-15 DIAGNOSIS — M47812 Spondylosis without myelopathy or radiculopathy, cervical region: Secondary | ICD-10-CM

## 2021-12-15 DIAGNOSIS — D62 Acute posthemorrhagic anemia: Secondary | ICD-10-CM

## 2021-12-15 DIAGNOSIS — Z87891 Personal history of nicotine dependence: Secondary | ICD-10-CM

## 2021-12-15 DIAGNOSIS — G4700 Insomnia, unspecified: Secondary | ICD-10-CM

## 2021-12-15 DIAGNOSIS — Z86718 Personal history of other venous thrombosis and embolism: Secondary | ICD-10-CM

## 2021-12-15 DIAGNOSIS — E1159 Type 2 diabetes mellitus with other circulatory complications: Secondary | ICD-10-CM | POA: Diagnosis not present

## 2021-12-15 DIAGNOSIS — F32A Depression, unspecified: Secondary | ICD-10-CM

## 2021-12-15 DIAGNOSIS — E2609 Other primary hyperaldosteronism: Secondary | ICD-10-CM | POA: Diagnosis not present

## 2021-12-15 DIAGNOSIS — H43812 Vitreous degeneration, left eye: Secondary | ICD-10-CM

## 2021-12-15 DIAGNOSIS — N400 Enlarged prostate without lower urinary tract symptoms: Secondary | ICD-10-CM

## 2021-12-15 DIAGNOSIS — I4891 Unspecified atrial fibrillation: Secondary | ICD-10-CM

## 2021-12-15 DIAGNOSIS — Z9181 History of falling: Secondary | ICD-10-CM

## 2021-12-15 DIAGNOSIS — Z794 Long term (current) use of insulin: Secondary | ICD-10-CM

## 2021-12-15 DIAGNOSIS — Z8673 Personal history of transient ischemic attack (TIA), and cerebral infarction without residual deficits: Secondary | ICD-10-CM

## 2021-12-15 DIAGNOSIS — M170 Bilateral primary osteoarthritis of knee: Secondary | ICD-10-CM

## 2021-12-15 DIAGNOSIS — E1121 Type 2 diabetes mellitus with diabetic nephropathy: Secondary | ICD-10-CM

## 2021-12-15 DIAGNOSIS — Z9049 Acquired absence of other specified parts of digestive tract: Secondary | ICD-10-CM

## 2021-12-15 DIAGNOSIS — M5030 Other cervical disc degeneration, unspecified cervical region: Secondary | ICD-10-CM

## 2021-12-15 DIAGNOSIS — I152 Hypertension secondary to endocrine disorders: Secondary | ICD-10-CM

## 2021-12-15 DIAGNOSIS — S32019D Unspecified fracture of first lumbar vertebra, subsequent encounter for fracture with routine healing: Secondary | ICD-10-CM

## 2021-12-15 DIAGNOSIS — A219 Tularemia, unspecified: Secondary | ICD-10-CM

## 2021-12-15 DIAGNOSIS — S2241XD Multiple fractures of ribs, right side, subsequent encounter for fracture with routine healing: Secondary | ICD-10-CM | POA: Diagnosis not present

## 2021-12-15 DIAGNOSIS — F419 Anxiety disorder, unspecified: Secondary | ICD-10-CM

## 2021-12-15 DIAGNOSIS — M5136 Other intervertebral disc degeneration, lumbar region: Secondary | ICD-10-CM

## 2021-12-15 DIAGNOSIS — Z7982 Long term (current) use of aspirin: Secondary | ICD-10-CM

## 2021-12-15 DIAGNOSIS — Z7901 Long term (current) use of anticoagulants: Secondary | ICD-10-CM

## 2021-12-15 DIAGNOSIS — E1140 Type 2 diabetes mellitus with diabetic neuropathy, unspecified: Secondary | ICD-10-CM

## 2021-12-15 DIAGNOSIS — G4733 Obstructive sleep apnea (adult) (pediatric): Secondary | ICD-10-CM

## 2021-12-15 NOTE — Telephone Encounter (Signed)
Yes, okay.

## 2021-12-15 NOTE — Telephone Encounter (Signed)
Signed form faxed back.

## 2021-12-15 NOTE — Telephone Encounter (Signed)
Home health orders received 12/15/21 for Benjamin Powell health initiation orders: Yes.  Home health re-certification orders: No. Patient last seen by ordering physician for this condition: 11/26/21. Must be less than 90 days for re-certification and less than 30 days prior for initiation. Visit must have been for the condition the orders are being placed.  Patient meets criteria for Physician to sign orders: Yes.        Current med list has been attached: Yes        Orders placed on physicians desk for signature: 12/15/21 (date) If patient does not meet criteria for orders to be signed: pt was called to schedule appt. Appt is scheduled for 01/03/22.   Placed on PCP desk to review and sign, if appropriate.   Emilee Hero

## 2022-01-03 ENCOUNTER — Ambulatory Visit: Payer: Medicare HMO | Admitting: Family Medicine

## 2022-01-03 NOTE — Progress Notes (Deleted)
OFFICE VISIT  01/03/2022  CC: No chief complaint on file.  Patient is a 76 y.o. male who presents for 5-week follow-up depression. A/P as of last visit: "#1 major depressive disorder, partial remission. Continue sertraline 200 mg a day. Her plan is to address some of the physical symptoms that have him so frustrated and hopefully this will alleviate some of his depression   #2 BPH with lower urinary tract obstructive symptoms. Start a trial of Cialis 5 mg a day.  He is not on any nitrates. Alpha blockers are relatively contraindicated due to his history of orthostatic hypotension. Consider finasteride trial in the future. May ultimately need to see urologist. His chronic hyperglycemia is complicating this and we discussed the importance of getting his sugars better today.  He is working hard with his endocrinologist and things seem to be improving some.   #3 chronic diarrhea.  Unknown etiology. He is followed by gastroenterology. Will ask their office for report of recent colonoscopy."  INTERIM HX: ***  Past Medical History:  Diagnosis Date   Anxiety and depression    BPH with obstruction/lower urinary tract symptoms    Cervical spondylosis    Surgery 2021   Chronic renal insufficiency, stage 3 (moderate) (HCC)    Frequent falls    GERD (gastroesophageal reflux disease)    Hemorrhoids    History of adenomatous polyp of colon    History of CVA (cerebrovascular accident) 10/2016   CTA head and neck with no occlusive dz. +advanced chronic ischemic dz changes on MR   History of DVT (deep vein thrombosis) 2022   L leg.  Hypercoag w/u NEG.   Hypertension    Treatment resistant   Impaired mobility and ADLs    Orthostatic hypotension    OSA on CPAP    PAF (paroxysmal atrial fibrillation) (HCC)    Recurrent nephrolithiasis    Type 2 diabetes with nephropathy [facility])    endocrinologist   Urge incontinence     Past Surgical History:  Procedure Laterality Date   ANKLE SURGERY   1993   left   ANTERIOR CERVICAL DECOMP/DISCECTOMY FUSION N/A 10/02/2019   Procedure: ANTERIOR CERVICAL DECOMPRESSION FUSION CERVICAL THREE-FOUR WITH INSTRUMENTATION AND ALLOGRAFT;  Surgeon: Phylliss Bob, MD;  Location: [place];  Service: Orthopedics;  Laterality: N/A;   CATARACT EXTRACTION Right    CHOLECYSTECTOMY  1990   COLONOSCOPY     2013 multiple hyperplastic polyps. 04/08/19 Many hyperplastic/inflammatory polyps, adenoma x1.  11/2021->results pending   ERCP  1990   Hammer Toe Repair Right 09/16/2016   RT #5   Partial Excision Phalanx Left 09/16/2016   LT#5   rib fixation  05/2021   TONSILLECTOMY AND ADENOIDECTOMY  1998   and sinus for sleep apnea   TRANSTHORACIC ECHOCARDIOGRAM  07/07/2017   07/2017 EF 55-60%, mild MR, no shunt. 05/2021 EF normal, valves nl.  11/05/21 EF 60-65%, mild LVH, grd I DD, mild MR    Outpatient Medications Prior to Visit  Medication Sig Dispense Refill   AMBULATORY NON FORMULARY MEDICATION Medication Name: Aloe Gold probiotic daily     apixaban (ELIQUIS) 5 MG TABS tablet Take 5 mg by mouth 2 (two) times daily.     ascorbic acid (VITAMIN C) 1000 MG tablet Take 1 tablet by mouth daily. (Patient not taking: Reported on 10/08/2021)     aspirin 81 MG EC tablet Take 1 tablet by mouth daily.     BD PEN NEEDLE NANO 2ND GEN 32G X 4 MM MISC 4 (four)  times daily. as directed     budesonide (ENTOCORT EC) 3 MG 24 hr capsule Take 9 mg by mouth daily.     Cholecalciferol 125 MCG (5000 UT) TABS Take by mouth.     Continuous Blood Gluc Sensor (DEXCOM G6 SENSOR) MISC by Does not apply route.     HUMALOG KWIKPEN 100 UNIT/ML KwikPen SMARTSIG:10 Unit(s) SUB-Q     insulin glargine, 2 Unit Dial, (TOUJEO MAX SOLOSTAR) 300 UNIT/ML Solostar Pen Inject into the skin.     metoprolol succinate (TOPROL XL) 25 MG 24 hr tablet Take 0.5 tablets (12.5 mg total) by mouth daily. Take with or immediately following a meal. 45 tablet 3   OZEMPIC, 0.25 OR 0.5 MG/DOSE, 2 MG/3ML SOPN Inject into the  skin.     sertraline (ZOLOFT) 100 MG tablet Take 1 tablet (100 mg total) by mouth daily. 60 tablet 0   tadalafil (CIALIS) 5 MG tablet 1 tab po qd 30 tablet 1   No facility-administered medications prior to visit.    No Active Allergies  ROS As per HPI  PE:    11/26/2021    2:27 PM 11/11/2021    1:10 PM 11/11/2021    1:09 PM  Vitals with BMI  Height '6\' 0"'$   '6\' 0"'$   Weight 217 lbs 6 oz  217 lbs  BMI 76.16  07.37  Systolic 106 269   Diastolic 80 74   Pulse 65 61      Physical Exam  ***  LABS:  {Labs (Optional):23779}  IMPRESSION AND PLAN:  No problem-specific Assessment & Plan notes found for this encounter.   An After Visit Summary was printed and given to the patient.  FOLLOW UP: No follow-ups on file.  '@esig'$ @

## 2022-01-04 ENCOUNTER — Encounter: Payer: Self-pay | Admitting: Family Medicine

## 2022-01-19 ENCOUNTER — Telehealth: Payer: Self-pay | Admitting: Family Medicine

## 2022-01-19 DIAGNOSIS — F331 Major depressive disorder, recurrent, moderate: Secondary | ICD-10-CM

## 2022-01-19 NOTE — Telephone Encounter (Signed)
FYI  Please see below

## 2022-01-19 NOTE — Telephone Encounter (Signed)
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 MS. [employer] did not mention anything in regards to her husbands mental status  nor making threats, she mentioned a referral about a psychologist for her self.

## 2022-01-19 NOTE — Telephone Encounter (Signed)
Pt's Physical therapist Ander Purpura called in stating that Benjamin Powell called in reporting that her husband needs mental health assistance. He is not taking his medications and is saying he wants to die.

## 2022-01-21 ENCOUNTER — Ambulatory Visit: Payer: Medicare HMO | Admitting: Internal Medicine

## 2022-02-03 NOTE — Telephone Encounter (Signed)
Pt is seeking referral for psychologist, and is scheduled for a follow up appt on 02/09/22.

## 2022-02-03 NOTE — Telephone Encounter (Signed)
Please review message. Pt calling back asking about this. Clerical staff encouraged patient to reschedule missed appt

## 2022-02-03 NOTE — Telephone Encounter (Signed)
Okay, psychologist referral ordered.

## 2022-02-04 NOTE — Telephone Encounter (Signed)
Tried calling patient, unable to LVM.  

## 2022-02-08 NOTE — Telephone Encounter (Signed)
Pt was advised of referral.

## 2022-02-09 ENCOUNTER — Ambulatory Visit: Payer: Medicare HMO | Admitting: Family Medicine

## 2022-02-09 ENCOUNTER — Telehealth: Payer: Self-pay

## 2022-02-09 NOTE — Telephone Encounter (Signed)
Wife of patient, Benjamin Powell [facility]) and patient called office on speaker phone. Both patient and his wife had conversation with me and each other during this call.  "His wife begin the call with by introducing herself and then saying "what does it take to get a call from your office at the time someone is told they would call you?" I told her to explain in detail what she meant.  She was very agitated, "someone from your office was to call my husband today, no one has called."  I asked if it was a virtual visit or a follow up call. I asked if it was the appointment yesterday with Dr. Anitra Lauth.  Patient had cancelled appointment the day after the appt was made so that provider could discuss follow up referral with psychologist.  She said no. I could tell by her voice that she was getting more frustrated, and kept speaking over me when I was trying to understand her concern.  I asked her to please give me a few minutes to ask Dr. Anitra Lauth and his clinical assistant if they were aware of anything.  [name] kept repeating "someone from your office called my husband yesterday and told him he would have a telephone call from psychologist today at 2pm. What does it take for someone to put a referral in?  This is a life or death situation."  She said "I guess I will have to come up there to your office, and I dont care who I have to go thru, but I will get answers and a referral placed."  I  could not find any documentation of such conversation. I asked if I could put her hold while I get answers to her question. I spoke with provider and his assistant Vevelyn Royals, she informed she spoke to patient and told him referral has been entered and someone from [name] office would be calling him. I told Benjamin Powell and Benjamin Powell what Vevelyn Royals said above.  I gave them the office number of [name]. She thanked me and call ended.

## 2022-02-10 ENCOUNTER — Telehealth: Payer: Self-pay | Admitting: Family Medicine

## 2022-02-10 NOTE — Telephone Encounter (Signed)
Pt last seen 11/26/21.   Please advise if ok for orders?

## 2022-02-10 NOTE — Telephone Encounter (Signed)
Benjamin Powell with [facility] called to request verbal orders to continue at home Physical therapy. I advised her that he would need to have an appointment with [place], but I would send the request back for review. Please give Ander Purpura a call at [phone] which is a secure line.

## 2022-02-10 NOTE — Telephone Encounter (Signed)
Yes okay 

## 2022-02-11 NOTE — Telephone Encounter (Signed)
Left detailed message with approved orders to Lauren on secure line.

## 2022-02-14 DIAGNOSIS — N400 Enlarged prostate without lower urinary tract symptoms: Secondary | ICD-10-CM

## 2022-02-14 DIAGNOSIS — H43812 Vitreous degeneration, left eye: Secondary | ICD-10-CM

## 2022-02-14 DIAGNOSIS — Z87891 Personal history of nicotine dependence: Secondary | ICD-10-CM

## 2022-02-14 DIAGNOSIS — Z8673 Personal history of transient ischemic attack (TIA), and cerebral infarction without residual deficits: Secondary | ICD-10-CM

## 2022-02-14 DIAGNOSIS — D62 Acute posthemorrhagic anemia: Secondary | ICD-10-CM

## 2022-02-14 DIAGNOSIS — F419 Anxiety disorder, unspecified: Secondary | ICD-10-CM

## 2022-02-14 DIAGNOSIS — S22079D Unspecified fracture of T9-T10 vertebra, subsequent encounter for fracture with routine healing: Secondary | ICD-10-CM | POA: Diagnosis not present

## 2022-02-14 DIAGNOSIS — Z7982 Long term (current) use of aspirin: Secondary | ICD-10-CM

## 2022-02-14 DIAGNOSIS — E1140 Type 2 diabetes mellitus with diabetic neuropathy, unspecified: Secondary | ICD-10-CM

## 2022-02-14 DIAGNOSIS — F32A Depression, unspecified: Secondary | ICD-10-CM

## 2022-02-14 DIAGNOSIS — Z7901 Long term (current) use of anticoagulants: Secondary | ICD-10-CM

## 2022-02-14 DIAGNOSIS — G4700 Insomnia, unspecified: Secondary | ICD-10-CM

## 2022-02-14 DIAGNOSIS — Z86718 Personal history of other venous thrombosis and embolism: Secondary | ICD-10-CM

## 2022-02-14 DIAGNOSIS — K219 Gastro-esophageal reflux disease without esophagitis: Secondary | ICD-10-CM

## 2022-02-14 DIAGNOSIS — M5136 Other intervertebral disc degeneration, lumbar region: Secondary | ICD-10-CM

## 2022-02-14 DIAGNOSIS — M5030 Other cervical disc degeneration, unspecified cervical region: Secondary | ICD-10-CM

## 2022-02-14 DIAGNOSIS — Z9181 History of falling: Secondary | ICD-10-CM

## 2022-02-14 DIAGNOSIS — I152 Hypertension secondary to endocrine disorders: Secondary | ICD-10-CM

## 2022-02-14 DIAGNOSIS — E1159 Type 2 diabetes mellitus with other circulatory complications: Secondary | ICD-10-CM | POA: Diagnosis not present

## 2022-02-14 DIAGNOSIS — S32019D Unspecified fracture of first lumbar vertebra, subsequent encounter for fracture with routine healing: Secondary | ICD-10-CM

## 2022-02-14 DIAGNOSIS — G4733 Obstructive sleep apnea (adult) (pediatric): Secondary | ICD-10-CM

## 2022-02-14 DIAGNOSIS — M47812 Spondylosis without myelopathy or radiculopathy, cervical region: Secondary | ICD-10-CM

## 2022-02-14 DIAGNOSIS — E2609 Other primary hyperaldosteronism: Secondary | ICD-10-CM | POA: Diagnosis not present

## 2022-02-14 DIAGNOSIS — S2241XD Multiple fractures of ribs, right side, subsequent encounter for fracture with routine healing: Secondary | ICD-10-CM | POA: Diagnosis not present

## 2022-02-14 DIAGNOSIS — E1121 Type 2 diabetes mellitus with diabetic nephropathy: Secondary | ICD-10-CM

## 2022-02-14 DIAGNOSIS — Z9049 Acquired absence of other specified parts of digestive tract: Secondary | ICD-10-CM

## 2022-02-14 DIAGNOSIS — M170 Bilateral primary osteoarthritis of knee: Secondary | ICD-10-CM

## 2022-02-14 DIAGNOSIS — Z794 Long term (current) use of insulin: Secondary | ICD-10-CM

## 2022-02-14 DIAGNOSIS — I4891 Unspecified atrial fibrillation: Secondary | ICD-10-CM

## 2022-03-03 ENCOUNTER — Other Ambulatory Visit: Payer: Self-pay

## 2022-03-03 MED ORDER — TADALAFIL 5 MG PO TABS: ORAL_TABLET | ORAL | 0 refills | Status: DC

## 2022-03-08 ENCOUNTER — Ambulatory Visit (INDEPENDENT_AMBULATORY_CARE_PROVIDER_SITE_OTHER): Payer: Medicare HMO | Admitting: Psychology

## 2022-03-08 DIAGNOSIS — F32A Depression, unspecified: Secondary | ICD-10-CM | POA: Diagnosis not present

## 2022-03-08 NOTE — Progress Notes (Signed)
[place] Counselor Initial Adult Exam  Name: Benjamin Powell Date: 03/08/2022 MRN: [id number] DOB: [birth year] PCP: Tammi Sou, MD  Time Spent: 12:05  pm - 12:48 pm : 43 Minutes  Guardian/Payee:  Self.     Paperwork requested: No   Reason for Visit /Presenting Problem: Grief  Mental Status Exam: Appearance:   Casual and Neat     Behavior:  Appropriate  Motor:  Normal  Speech/Language:   Normal Rate  Affect:  Congruent  Mood:  depressed  Thought process:  normal  Thought content:    WNL  Sensory/Perceptual disturbances:    WNL  Orientation:  oriented to person, place, time/date, and situation  Attention:  Good  Concentration:  Good  Memory:  WNL  Fund of knowledge:   Good  Insight:    Good  Judgment:   Good  Impulse Control:  Good   Reported Symptoms:  Depression, anxiety, an grief.   Risk Assessment: Danger to Self:  No, see body of evaluation.  Self-injurious Behavior: No Danger to Others: No Duty to Warn:no Physical Aggression / Violence:No  Access to Firearms a concern: Yes , secured.  Gang Involvement:No  Patient / guardian was educated about steps to take if suicide or homicide risk level increases between visits: yes While future psychiatric events cannot be accurately predicted, the patient does not currently require acute inpatient psychiatric care and does not currently meet [facility] involuntary commitment criteria.  In case of a mental health emergency:  22 - confidential suicide hotline. [facility]):        [place], [place] [address]       [phone] 3.   911  4.   Visiting Nearest ED.    Substance Abuse History: Current substance abuse: No     Caffeine: None Alcohol: None. "I never really enjoyed it but drank quite a bit in my younger days".  Tobacco: Previous smoker `4.5 Substance use: denied.   Past Psychiatric History:   Previous psychological  history is significant for anxiety and depression Outpatient Providers: No History.  History of Psych Hospitalization: No  Psychological Testing:  NA     No family history of mental health concerns.   Abuse History:  Victim of: No.,  na    Report needed: No. Victim of Neglect:No. Perpetrator of  na   Witness / Exposure to Domestic Violence: No   Protective Services Involvement: No  Witness to [employer] Violence:  No   Family History:  Family History  Problem Relation Age of Onset   Pancreatic cancer Mother    Cancer Father    Early death Father    Colitis Sister    Diabetes Sister    Stroke Brother    Colitis Brother    Diabetes Brother    Heart attack Brother    Cancer Brother    Stroke Brother    Colon cancer Neg Hx    Esophageal cancer Neg Hx    Rectal cancer Neg Hx    Stomach cancer Neg Hx     Living situation: the patient lives with their spouse  Sexual Orientation: Straight  Relationship Status: married  Name of spouse / other: [name] (96 years) If a parent, number of children / ages: Twins, 61, Eddie Dibbles (deceased - covid) and [place].  Two sons and one daughter).   Support Systems: spouse Daughter and husband, and friends in [place].   Financial Stress:  Yes , behind  on mortgage due to medical bills.   Income/Employment/Disability: Super Therapist, occupational and previously a Games developer.   Military Service: No   Educational History: Education:  [employer]   Religion/Sprituality/World View: Lutheran  Any cultural differences that may affect / interfere with treatment:  not applicable   Recreation/Hobbies: Fishing, hunting, yard-work.   Stressors: Other: loss of son and financial stressors.     Strengths: Supportive Relationships, Family, Church, Spirituality, Conservator, museum/gallery, and Able to Communicate Effectively  Barriers:  Finances and grief.    Legal History: Pending legal issue / charges: The patient has no significant history of legal issues. History of  legal issue / charges:  na  Medical History/Surgical History: reviewed Past Medical History:  Diagnosis Date   Anxiety and depression    BPH with obstruction/lower urinary tract symptoms    Cervical spondylosis    Surgery 2021   Chronic renal insufficiency, stage 3 (moderate) (HCC)    Frequent falls    GERD (gastroesophageal reflux disease)    Hemorrhoids    History of adenomatous polyp of colon    History of CVA (cerebrovascular accident) 10/2016   CTA head and neck with no occlusive dz. +advanced chronic ischemic dz changes on MR   History of DVT (deep vein thrombosis) 2022   L leg.  Hypercoag w/u NEG.   Hypertension    Treatment resistant   Impaired mobility and ADLs    Orthostatic hypotension    OSA on CPAP    PAF (paroxysmal atrial fibrillation) (HCC)    Recurrent nephrolithiasis    Type 2 diabetes with nephropathy [facility])    endocrinologist   Urge incontinence     Past Surgical History:  Procedure Laterality Date   ANKLE SURGERY  1993   left   ANTERIOR CERVICAL DECOMP/DISCECTOMY FUSION N/A 10/02/2019   Procedure: ANTERIOR CERVICAL DECOMPRESSION FUSION CERVICAL THREE-FOUR WITH INSTRUMENTATION AND ALLOGRAFT;  Surgeon: Phylliss Bob, MD;  Location: [place];  Service: Orthopedics;  Laterality: N/A;   CATARACT EXTRACTION Right    CHOLECYSTECTOMY  1990   COLONOSCOPY     2013 multiple hyperplastic polyps. 04/08/19 Many hyperplastic/inflammatory polyps, adenoma x1.  11/2021->results pending   ERCP  1990   Hammer Toe Repair Right 09/16/2016   RT #5   Partial Excision Phalanx Left 09/16/2016   LT#5   rib fixation  05/2021   TONSILLECTOMY AND ADENOIDECTOMY  1998   and sinus for sleep apnea   TRANSTHORACIC ECHOCARDIOGRAM  07/07/2017   07/2017 EF 55-60%, mild MR, no shunt. 05/2021 EF normal, valves nl.  11/05/21 EF 60-65%, mild LVH, grd I DD, mild MR    Medications: Current Outpatient Medications  Medication Sig Dispense Refill   AMBULATORY NON FORMULARY MEDICATION Medication  Name: Aloe Gold probiotic daily     apixaban (ELIQUIS) 5 MG TABS tablet Take 5 mg by mouth 2 (two) times daily.     ascorbic acid (VITAMIN C) 1000 MG tablet Take 1 tablet by mouth daily. (Patient not taking: Reported on 10/08/2021)     aspirin 81 MG EC tablet Take 1 tablet by mouth daily.     BD PEN NEEDLE NANO 2ND GEN 32G X 4 MM MISC 4 (four) times daily. as directed     budesonide (ENTOCORT EC) 3 MG 24 hr capsule Take 9 mg by mouth daily.     Cholecalciferol 125 MCG (5000 UT) TABS Take by mouth.     Continuous Blood Gluc Sensor (DEXCOM G6 SENSOR) MISC by Does not apply route.  HUMALOG KWIKPEN 100 UNIT/ML KwikPen SMARTSIG:10 Unit(s) SUB-Q     insulin glargine, 2 Unit Dial, (TOUJEO MAX SOLOSTAR) 300 UNIT/ML Solostar Pen Inject into the skin.     metoprolol succinate (TOPROL XL) 25 MG 24 hr tablet Take 0.5 tablets (12.5 mg total) by mouth daily. Take with or immediately following a meal. 45 tablet 3   OZEMPIC, 0.25 OR 0.5 MG/DOSE, 2 MG/3ML SOPN Inject into the skin.     sertraline (ZOLOFT) 100 MG tablet Take 1 tablet (100 mg total) by mouth daily. 60 tablet 0   tadalafil (CIALIS) 5 MG tablet 1 tab po qd 30 tablet 0   No current facility-administered medications for this visit.    No Active Allergies  Diagnoses:  Depression, unspecified depression type  Plan of Care: Outpatient & psychiatric consult.   Narrative:  Archie participated in the session from the office, with the therapist, and consented to treatment.  Prior to the start of the evaluation confidentiality was reviewed and are she expressed understanding and provided consent to proceed.  Already referred by his primary care physician due to grief and loss.  Archie lost his son in late 2022 (December 1) due to [place].  Archie noted this being a struggle for him, managing his grief, since the loss.  He noted that his wife is also beginning treatment for grief and loss.  Archie's chart reflects a prescription of sertraline 100 mg daily  prescribed by his primary care physician.  Archie confirmed that he continues to take his medication but wonders if medication is effective.  She does not have a history of counseling, psychological assessment or treatment, or psychiatric treatment with a psychiatric provider.  She is retired and currently has a difficult time engaging in maintaining a schedule of enjoyable activities.  We discussed primarily watching TV as his singular form of entertainment.  He previously enjoyed reading, hunting, and fishing.  It appears that he has insufficient social support in the area as he is a transplant from [place] from the way of [place].  He appears to have numerous health concerns but noted that he is currently being addressed.  He does have a daughter Olivia Mackie who lives in [place].  He noted not seeing her for some time after the loss of her brother his son.  Lesleigh Noe has 3 grandchildren to which belong to his son Eddie Dibbles who is deceased, and one wants of his daughter Olivia Mackie.  Archie moved for work,numerous times, which affected his level of social support. Archie's PHQ-9 reflected a possibility of SI. We processed this during the session and Archie denied any SI. He does not have a family history or personal history of SI or self-harm. He noted that his endorsement of this ont he PHQ was in relation to missing his son but denied any intent or plan. We reviewed safety during the session and Archie was agreeable. Please see safety plan below. Archie presented as self-aware and motivated for change.   GAD-7: 21 PHQ-9: 24 (with correction regarding no SI)  In case of a mental health emergency:  45 - confidential suicide hotline. [facility]):        [place], [place] [address]       [phone] 3.   911  4.   Visiting Nearest ED.     Buena Irish, LCSW

## 2022-03-30 ENCOUNTER — Telehealth: Payer: Self-pay | Admitting: Gastroenterology

## 2022-03-30 NOTE — Telephone Encounter (Signed)
Request received to transfer GI care from outside practice to [facility].  We appreciate the interest in our practice, however due to capacity and high demand from patients without established GI providers I cannot accommodate this transfer.  Dr. Rush Landmark can decide on his ability to accommodate this local transfer of care request.

## 2022-03-30 NOTE — Telephone Encounter (Signed)
Good Afternoon Dr Fuller Plan  We have received a request from patient wanting to transfer care back to [place] from [facility] but patient is asking to be seen by Dr Rush Landmark.  Please review and advise on scheduling.  Thank you

## 2022-04-05 ENCOUNTER — Ambulatory Visit: Payer: Medicare HMO | Admitting: Psychology

## 2022-04-06 NOTE — Telephone Encounter (Signed)
I have reviewed the chart. The patient has been evaluated by [facility] gastro recently.   He has also been evaluated by digestive health gastro as well recently.   He has follow-ups with both of these groups within the next few weeks. Another GI provider coming into the mix does not make great sense.  I am willing to consider meeting, the patient, but if he has intentions of having multiple GI providers, then I do not think this is good for patient care. If he is willing to wait for a new patient appointment with me (looks like January at this time, he can be placed (do not use an overbook slot). Otherwise, he may stay with his current provider(s). GM

## 2022-04-12 ENCOUNTER — Encounter: Payer: Self-pay | Admitting: Gastroenterology

## 2022-04-19 ENCOUNTER — Ambulatory Visit (INDEPENDENT_AMBULATORY_CARE_PROVIDER_SITE_OTHER): Payer: Medicare HMO | Admitting: Psychology

## 2022-04-19 DIAGNOSIS — F32A Depression, unspecified: Secondary | ICD-10-CM | POA: Diagnosis not present

## 2022-04-19 NOTE — Progress Notes (Unsigned)
[facility] Counselor/Therapist Progress Note  Patient ID: Benjamin Powell, MRN: [id number]    Date: 04/19/22  Time Spent: 3:03  pm - 4:03 pm : 60 Minutes  Treatment Type: Individual Therapy.  Reported Symptoms: depression, anxiety, grief.   Mental Status Exam: Appearance:  Casual     Behavior: Appropriate  Motor: Normal  Speech/Language:  Clear and Coherent  Affect: Congruent  Mood: depressed  Thought process: normal  Thought content:   WNL  Sensory/Perceptual disturbances:   WNL  Orientation: oriented to person, place, time/date, and situation  Attention: Good  Concentration: Good  Memory: WNL  Fund of knowledge:  Good  Insight:   Good  Judgment:  Good  Impulse Control: Good   Risk Assessment: Danger to Self:  Yes.  without intent/plan Self-injurious Behavior: No Danger to Others: No Duty to Warn:no Physical Aggression / Violence:No  Access to Firearms a concern: Yes . Benjamin Powell and Benjamin Powell will work on Dentist which do not have ammunition and appear to be inoperable.  Gang Involvement:No   In case of a mental health emergency:  13 - confidential suicide hotline. [facility]):        [place], [place] [address]       [phone] 3.   911  4.   Visiting Nearest ED.   No family history of suicidal thoughts.   Subjective:   Benjamin Powell participated in the session, in person in the office with the therapist, and consented to treatment Benjamin Powell reviewed the events of the past week. Benjamin Powell invited his wife in the session and provided verbal approval for her attendance. We reviewed confidentiality. We reviewed numerous treatment approaches including CBT, BA, Problem Solving, and Solution focused therapy. Psych-education regarding the Benjamin Powell's diagnosis of Depression, unspecified depression type was provided during the session. We discussed Benjamin Powell goals treatment goals  which include improve frustration tolerance and mood management, processing the loss of his son (2 years ago), improve communication, improve self-care, adhere to medication regimen, reduce statements of SI (no reported intent, plan, or history). Benjamin Powell provided verbal approval of the treatment plan. Benjamin Powell noted Benjamin Powell's stroke, 5 years ago, and the corresponding personality change. She noted this worsening after their son's passing, 2 years ago. We discussed safety, self-care, & psychiatric evaluation (ROI on 04/19/22). Both Benjamin Powell and Benjamin Powell noted understanding and agreement to maintain safety.    Interventions: Psycho-education & Goal Setting.   Diagnosis:   Depression, unspecified depression type  Psychiatric Treatment: Yes , Zoloft 100 mg qd by PCP - Dr. Ernestine Conrad.   Treatment Plan:  Client Abilities/Strengths Benjamin Powell is motivated for change.   Support System: Family and friends.   Client Treatment Preferences Outpatient Therapy.   Client Statement of Needs Benjamin Powell would like to improve frustration tolerance and mood management, processing the loss of his son (2 years ago), improve communication, improve self-care, adhere to medication regimen, reduce statements of SI (no reported intent, plan, or history).   Treatment Level Weekly  Symptoms  Anxiety: feeling nervous, difficulty managing worry, worry about different things, trouble relaxing, restlessness, irritability, and feeling afraid something awful might happen.    (Status: maintained) Depression:  loss of interest, feeling depressed, poor sleep, lethargy, fluctuating appetite, feeling bad about self, trouble concentrating, psychomotor retardation, grief, SI.   (Status: maintained)  Goals:   Benjamin Powell experiences symptoms of depression, anxiety, and grief.    Target Date: 04/20/23 Frequency: Weekly  Progress:  0 Modality: individual    Therapist will provide referrals for additional resources as appropriate.   Therapist will provide psycho-education regarding Benjamin Powell's diagnosis and corresponding treatment approaches and interventions. Licensed Clinical Social Worker, [place], LCSW will support the patient's ability to achieve the goals identified. will employ CBT, BA, Problem-solving, Solution Focused, Mindfulness,  coping skills, & other evidenced-based practices will be used to promote progress towards healthy functioning to help manage decrease symptoms associated with his diagnosis.   Reduce overall level, frequency, and intensity of the feelings of depression, anxiety and panic evidenced by decreased overall symptoms from 6 to 7 days/week to 0 to 1 days/week per client report for at least 3 consecutive months. Verbally express understanding of the relationship between feelings of depression, anxiety and their impact on thinking patterns and behaviors. Verbalize an understanding of the role that distorted thinking plays in creating fears, excessive worry, and ruminations.  Benjamin Powell participated in the creation of the treatment plan)    Buena Irish, LCSW

## 2022-05-04 ENCOUNTER — Ambulatory Visit: Payer: Medicare HMO | Admitting: Psychology

## 2022-05-04 NOTE — Progress Notes (Signed)
This encounter was created in error - please disregard.

## 2022-05-17 ENCOUNTER — Ambulatory Visit: Payer: Medicare HMO | Admitting: Psychology

## 2022-05-31 ENCOUNTER — Ambulatory Visit: Payer: Medicare HMO | Admitting: Psychology

## 2022-06-01 ENCOUNTER — Ambulatory Visit: Payer: Medicare HMO | Admitting: Gastroenterology

## 2022-06-14 ENCOUNTER — Ambulatory Visit: Payer: Medicare HMO | Admitting: Psychology

## 2022-07-11 ENCOUNTER — Encounter: Payer: Self-pay | Admitting: Gastroenterology

## 2022-09-06 ENCOUNTER — Ambulatory Visit: Payer: Medicare Other | Admitting: Gastroenterology

## 2022-09-12 ENCOUNTER — Encounter: Payer: Self-pay | Admitting: Gastroenterology

## 2022-10-05 ENCOUNTER — Telehealth: Payer: Self-pay

## 2022-10-05 NOTE — Transitions of Care (Post Inpatient/ED Visit) (Signed)
   10/05/2022  Name: Benjamin Powell MRN: [id number] DOB: 1946/02/05  Today's TOC FU Call Status: Today's TOC FU Call Status:: Unsuccessul Call (1st Attempt) Unsuccessful Call (1st Attempt) Date: 10/05/22  Attempted to reach the patient regarding the most recent Inpatient/ED visit.  Follow Up Plan: Additional outreach attempts will be made to reach the patient to complete the Transitions of Care (Post Inpatient/ED visit) call.   Signature Karena Addison, LPN [facility] Nurse Health Advisor Direct Dial [phone]

## 2022-10-06 NOTE — Transitions of Care (Post Inpatient/ED Visit) (Signed)
   10/06/2022  Name: SRITHAN BLEAZARD MRN: [id number] DOB: [birth year]  Today's TOC FU Call Status: Today's TOC FU Call Status:: Unsuccessful Call (2nd Attempt) Unsuccessful Call (1st Attempt) Date: 10/05/22 Unsuccessful Call (2nd Attempt) Date: 10/06/22  Attempted to reach the patient regarding the most recent Inpatient/ED visit.  Follow Up Plan: Additional outreach attempts will be made to reach the patient to complete the Transitions of Care (Post Inpatient/ED visit) call.   Signature Karena Addison, LPN [facility] Nurse Health Advisor Direct Dial [phone]

## 2022-10-11 NOTE — Transitions of Care (Post Inpatient/ED Visit) (Signed)
   10/11/2022  Name: Benjamin Powell MRN: [id number] DOB: 10/09/1945  Today's TOC FU Call Status: Today's TOC FU Call Status:: Unsuccessful Call (3rd Attempt) Unsuccessful Call (1st Attempt) Date: 10/05/22 Unsuccessful Call (2nd Attempt) Date: 10/06/22 Unsuccessful Call (3rd Attempt) Date: 10/11/22  Attempted to reach the patient regarding the most recent Inpatient/ED visit.  Follow Up Plan: No further outreach attempts will be made at this time. We have been unable to contact the patient.  Signature Karena Addison, LPN [facility] Nurse Health Advisor Direct Dial [phone]

## 2022-11-23 ENCOUNTER — Other Ambulatory Visit: Payer: Self-pay | Admitting: Orthopedic Surgery

## 2022-11-23 DIAGNOSIS — M545 Low back pain, unspecified: Secondary | ICD-10-CM

## 2022-11-26 ENCOUNTER — Ambulatory Visit (INDEPENDENT_AMBULATORY_CARE_PROVIDER_SITE_OTHER): Payer: Medicare HMO

## 2022-11-26 DIAGNOSIS — G8929 Other chronic pain: Secondary | ICD-10-CM

## 2022-11-26 DIAGNOSIS — M545 Low back pain, unspecified: Secondary | ICD-10-CM | POA: Diagnosis not present

## 2022-12-08 ENCOUNTER — Other Ambulatory Visit: Payer: Self-pay | Admitting: Orthopedic Surgery

## 2022-12-22 ENCOUNTER — Encounter (HOSPITAL_COMMUNITY): Admission: RE | Payer: Self-pay | Source: Home / Self Care

## 2022-12-22 ENCOUNTER — Ambulatory Visit (HOSPITAL_COMMUNITY): Admission: RE | Admit: 2022-12-22 | Payer: Medicare HMO | Source: Home / Self Care | Admitting: Orthopedic Surgery

## 2022-12-22 SURGERY — TRANSFORAMINAL LUMBAR INTERBODY FUSION (TLIF) WITH PEDICLE SCREW FIXATION 1 LEVEL
Anesthesia: General | Laterality: Left

## 2022-12-26 ENCOUNTER — Other Ambulatory Visit: Payer: Self-pay | Admitting: Orthopedic Surgery

## 2023-01-04 ENCOUNTER — Ambulatory Visit (HOSPITAL_COMMUNITY): Admission: RE | Admit: 2023-01-04 | Payer: Medicare HMO | Source: Home / Self Care | Admitting: Orthopedic Surgery

## 2023-01-04 ENCOUNTER — Encounter: Payer: Self-pay | Admitting: Gastroenterology

## 2023-01-04 ENCOUNTER — Encounter (HOSPITAL_COMMUNITY): Admission: RE | Payer: Self-pay | Source: Home / Self Care

## 2023-01-04 ENCOUNTER — Ambulatory Visit: Payer: Medicare HMO | Admitting: Gastroenterology

## 2023-01-04 VITALS — BP 110/70 | HR 63 | Ht 72.0 in | Wt 225.0 lb

## 2023-01-04 DIAGNOSIS — Z7901 Long term (current) use of anticoagulants: Secondary | ICD-10-CM

## 2023-01-04 DIAGNOSIS — Z09 Encounter for follow-up examination after completed treatment for conditions other than malignant neoplasm: Secondary | ICD-10-CM | POA: Diagnosis not present

## 2023-01-04 DIAGNOSIS — Z87898 Personal history of other specified conditions: Secondary | ICD-10-CM

## 2023-01-04 DIAGNOSIS — Z86010 Personal history of colonic polyps: Secondary | ICD-10-CM | POA: Diagnosis not present

## 2023-01-04 DIAGNOSIS — Z9049 Acquired absence of other specified parts of digestive tract: Secondary | ICD-10-CM

## 2023-01-04 DIAGNOSIS — Z8719 Personal history of other diseases of the digestive system: Secondary | ICD-10-CM | POA: Diagnosis not present

## 2023-01-04 SURGERY — TRANSFORAMINAL LUMBAR INTERBODY FUSION (TLIF) WITH PEDICLE SCREW FIXATION 1 LEVEL
Anesthesia: General | Laterality: Left

## 2023-01-04 NOTE — Progress Notes (Signed)
GASTROENTEROLOGY OUTPATIENT CLINIC VISIT   Primary Care Provider Carl Best, NP-C [address] [place] [address] [phone]   Patient Profile: Benjamin Powell is a 77 y.o. male with a pmh significant for prior CVA, atrial fibrillation (on anticoagulation), hypertension, diabetes, hyperlipidemia, degenerative joint disease of the back, status postcholecystectomy, colon polyps (TA's & SSPs), recurrent diarrhea NOS.  The patient presents to the [facility] for an evaluation and management of problem(s) noted below:  Problem List 1. History of colitis   2. History of diarrhea   3. Hx of adenomatous colonic polyps   4. Chronic anticoagulation   5. History of cholecystectomy     History of Present Illness Please see prior GI notes for full details of HPI.  However, he is considered a new patient as he establish with a new gastroenterologist and then transferred back to this practice and has seen me for the first time.  Interval History This is the patient's first visit the outpatient [facility] in almost 2 years.  He had transitioned his GI care to providers in [place] [employer] [place] when previously was followed by Dr. Russella Dar.  I take care of the patient's wife and they have wanted to transition care to here in [place] but initially had insurance issues and are now active with our group.  Today, the patient is feeling well.  He is not having significant GI symptoms at this time.  Patient states for the last 4 to 5 months his diarrheal symptoms are better.  He has not needed to restart budesonide.  When reviewing the patient's chart it looks like the patient was diagnosed with a focal active colitis of unclear etiology back in 2023 when he had his updated colonoscopy done with the digestive health group in [place].  With that being said, he was started on budesonide for potential effect and it was not clear if the patient was getting better so there  was consideration of using mesalamine therapy.  In the patient's last clinic visit from 9/23 it did suggest that he was doing better.  However, when he was referred here initially he was still having significant issues with diarrhea initially but could not see [place] due to insurance issues he was still having significant diarrhea (also patient's wife who is my patient reported significant issues to persisting).  Thankfully, the patient again reiterates that his issues have subsided.  Has not noted any blood in his stools.  He does not have a new bloating or abdominal pain.  His biggest issue is his upcoming surgery for degenerative joint disease/degenerative disc disease of his back.  GI Review of Systems Positive as above Negative for pyrosis, dysphagia, odynophagia, nausea, vomiting, pain, melena, hematochezia  Review of Systems General: Denies fevers/chills/weight loss unintentionally Cardiovascular: Denies chest pain Pulmonary: Denies shortness of breath Gastroenterological: See HPI Genitourinary: Denies darkened urine Hematological: Denies easy bruising/bleeding Endocrine: Denies temperature intolerance Dermatological: Denies skin changes Psychological: Mood is stable   Medications Current Outpatient Medications  Medication Sig Dispense Refill   AMBULATORY NON FORMULARY MEDICATION Medication Name: Aloe Gold probiotic daily     apixaban (ELIQUIS) 5 MG TABS tablet Take 5 mg by mouth 2 (two) times daily.     aspirin 81 MG EC tablet Take 1 tablet by mouth daily.     BD PEN NEEDLE NANO 2ND GEN 32G X 4 MM MISC 4 (four) times daily. as directed     Cholecalciferol 125 MCG (5000 UT) TABS Take by mouth.  Continuous Blood Gluc Sensor (DEXCOM G6 SENSOR) MISC by Does not apply route.     metoprolol succinate (TOPROL XL) 25 MG 24 hr tablet Take 0.5 tablets (12.5 mg total) by mouth daily. Take with or immediately following a meal. 45 tablet 3   OZEMPIC, 0.25 OR 0.5 MG/DOSE, 2 MG/3ML SOPN Inject  into the skin.     No current facility-administered medications for this visit.    Allergies No Active Allergies  Histories Past Medical History:  Diagnosis Date   Anxiety and depression    BPH with obstruction/lower urinary tract symptoms    Cervical spondylosis    Surgery 2021   Chronic renal insufficiency, stage 3 (moderate) (HCC)    Frequent falls    GERD (gastroesophageal reflux disease)    Hemorrhoids    History of adenomatous polyp of colon    History of CVA (cerebrovascular accident) 10/2016   CTA head and neck with no occlusive dz. +advanced chronic ischemic dz changes on MR   History of DVT (deep vein thrombosis) 2022   L leg.  Hypercoag w/u NEG.   Hypertension    Treatment resistant   Impaired mobility and ADLs    Orthostatic hypotension    OSA on CPAP    PAF (paroxysmal atrial fibrillation) (HCC)    Recurrent nephrolithiasis    Type 2 diabetes with nephropathy [facility])    endocrinologist   Urge incontinence    Past Surgical History:  Procedure Laterality Date   ANKLE SURGERY  1993   left   ANTERIOR CERVICAL DECOMP/DISCECTOMY FUSION N/A 10/02/2019   Procedure: ANTERIOR CERVICAL DECOMPRESSION FUSION CERVICAL THREE-FOUR WITH INSTRUMENTATION AND ALLOGRAFT;  Surgeon: Estill Bamberg, MD;  Location: [facility] OR;  Service: Orthopedics;  Laterality: N/A;   CATARACT EXTRACTION Right    CHOLECYSTECTOMY  1990   COLONOSCOPY     2013 multiple hyperplastic polyps. 04/08/19 Many hyperplastic/inflammatory polyps, adenoma x1.  11/2021->results pending   ERCP  1990   Hammer Toe Repair Right 09/16/2016   RT #5   Partial Excision Phalanx Left 09/16/2016   LT#5   rib fixation  05/2021   TONSILLECTOMY AND ADENOIDECTOMY  1998   and sinus for sleep apnea   TRANSTHORACIC ECHOCARDIOGRAM  07/07/2017   07/2017 EF 55-60%, mild MR, no shunt. 05/2021 EF normal, valves nl.  11/05/21 EF 60-65%, mild LVH, grd I DD, mild MR   Social History   Socioeconomic History   Marital status: Married     Spouse name: Not on file   Number of children: 2   Years of education: Not on file   Highest education level: Not on file  Occupational History   Not on file  Tobacco Use   Smoking status: Former    Current packs/day: 0.00    Average packs/day: 0.3 packs/day for 50.0 years (15.0 ttl pk-yrs)    Types: Cigarettes    Start date: 08/07/1969    Quit date: 08/08/2019    Years since quitting: 3.4   Smokeless tobacco: Never  Vaping Use   Vaping status: Never Used  Substance and Sexual Activity   Alcohol use: No   Drug use: No   Sexual activity: Not on file  Other Topics Concern   Not on file  Social History Narrative   Married   Educ: college   Occup:    52 pack-yr tobacco, quit 2022.   No alc/drugs.   Social Determinants of Health   Financial Resource Strain: Medium Risk (12/27/2022)   Received from [facility]  Overall Financial Resource Strain (CARDIA)    Difficulty of Paying Living Expenses: Somewhat hard  Food Insecurity: Food Insecurity Present (12/27/2022)   Received from [facility]   Hunger Vital Sign    Worried About Running Out of Food in the Last Year: Sometimes true    Ran Out of Food in the Last Year: Sometimes true  Transportation Needs: No Transportation Needs (12/27/2022)   Received from [facility] - Transportation    Lack of Transportation (Medical): No    Lack of Transportation (Non-Medical): No  Physical Activity: Inactive (12/27/2022)   Received from [facility]   Exercise Vital Sign    Days of Exercise per Week: 0 days    Minutes of Exercise per Session: 10 min  Stress: No Stress Concern Present (12/27/2022)   Received from [facility] - Occupational Stress Questionnaire    Feeling of Stress : Only a little  Social Connections: Somewhat Isolated (12/27/2022)   Received from [facility]   Social Network    How would you rate your social network (family, work, friends)?: Restricted  participation with some degree of social isolation  Intimate Partner Violence: Not At Risk (12/27/2022)   Received from [facility]   HITS    Over the last 12 months how often did your partner physically hurt you?: 1    Over the last 12 months how often did your partner insult you or talk down to you?: 1    Over the last 12 months how often did your partner threaten you with physical harm?: 1    Over the last 12 months how often did your partner scream or curse at you?: 1   Family History  Problem Relation Age of Onset   Pancreatic cancer Mother    Cancer Father    Early death Father    Colitis Sister    Diabetes Sister    Stroke Brother    Colitis Brother    Diabetes Brother    Heart attack Brother    Cancer Brother    Stroke Brother    Colon cancer Neg Hx    Esophageal cancer Neg Hx    Rectal cancer Neg Hx    Stomach cancer Neg Hx    I have reviewed his medical, social, and family history in detail and updated the electronic medical record as necessary.    PHYSICAL EXAMINATION  BP 110/70   Pulse 63   Ht 6' (1.829 m)   Wt 225 lb (102.1 kg)   BMI 30.52 kg/m  Wt Readings from Last 3 Encounters:  01/04/23 225 lb (102.1 kg)  11/26/21 217 lb 6.4 oz (98.6 kg)  11/11/21 217 lb (98.4 kg)  GEN: NAD, appears stated age, doesn't appear chronically ill PSYCH: Cooperative, without pressured speech EYE: Conjunctivae pink, sclerae anicteric ENT: MMM CV: Nontachycardic RESP: No audible wheezing GI: NABS, soft, NT/ND, without rebound or guarding, no HSM appreciated MSK/EXT: No significant lower extremity edema SKIN: No jaundice NEURO:  Alert & Oriented x 3, no focal deficits   REVIEW OF DATA  I reviewed the following data at the time of this encounter:  GI Procedures and Studies  July 2023 colonoscopy outside provider True colonoscopy report we cannot see we will try to get report GI outside documentation suggest that he had focal active colitis diffusely adenomatous colon  polyps and a 3-year recall was given  Pathology DIAGNOSIS  1. COLON, "SIGMOID COLON"-POLYPECTOMY:  HYPERPLASTIC POLYP  WITH MUCOSAL PROLAPSE RELATED CHANGES.  2. COLON, "HEPATIC FLEXURE"-POLYPECTOMY:  TUBULAR ADENOMA, NEGATIVE FOR HIGH GRADE DYSPLASIA.  SESSILE SERRATED LESION,  NEGATIVE FOR DYSPLASIA.   INFLAMMATORY GRANULATION TISSUE POLYP, NEGATIVE FOR  DYSPLASIA.  3. COLON, "CECUM"-POLYPECTOMY:  BENIGN POLYPOID COLONIC MUCOSA, NEGATIVE FOR DYSPLASIA.  4. COLON, "ENTIRE COLON"-BIOPSY:  FOCAL ACTIVE COLITIS, NEGATIVE FOR DYSPLASIA.  COMMENT: DIFFERENTIAL CONSIDERATIONS INCLUDE BOWEL PREPARATION INJURY, EARLY  ISCHEMIC INJURY, ACUTE PHASE OF INFLAMMATORY BOWEL DISEASE, ACUTE SELF LIMITED  INFECTIOUS COLITIS, AND DRUG INJURY (NSAIDS).  (CRR)   Laboratory Studies  Reviewed those in epic and Care Everywhere  Imaging Studies  1/23 outside CT chest/abdomen/pelvis (performed after a fall) FINDINGS:  CT ABDOMEN PELVIS  Liver: Unremarkable  Gallbladder and Biliary Tree: Unremarkable  Pancreas: Unremarkable  Spleen: Unremarkable  Adrenal Glands: Unremarkable  Kidneys and Urinary Bladder: Subcentimeter nonobstructing calculus lower pole right kidney.  Reproductive: Mild prostatomegaly.  Abdominal Aorta: Unremarkable  Lymph Nodes: Unremarkable  Peritoneum: No free fluid. No free air.  No abscess.  Gastrointestinal Tract: No bowel obstruction.  No evidence for acute appendicitis.  Abdominal and Pelvic Walls: Unremarkable  Bones: Unremarkable    ASSESSMENT  Mr. Gelin is a 77 y.o. male with a pmh significant for prior CVA, atrial fibrillation (on anticoagulation), hypertension, diabetes, hyperlipidemia, degenerative joint disease of the back, status postcholecystectomy, colon polyps (TA's & SSPs), recurrent diarrhea NOS.  The patient is seen today for evaluation and management of:  1. History of colitis   2. History of diarrhea   3. Hx of adenomatous colonic polyps   4. Chronic  anticoagulation   5. History of cholecystectomy    The patient is hemodynamically and clinically stable from a GI perspective at this time.  Thankfully is not having any recurrence of his diarrhea.  Patient had focal active colitis throughout his colon on last colonoscopy in 2023.  I would be concerned that if the patient has recurrent symptoms then updated colonoscopy may need to be required to see if there truly is any evidence of chronicity of changes.  As well, it is never was clear that the patient had lymphocytic colitis but budesonide therapy was used as well as a short course of mesalamine therapy.  He is now off both of these.  Patient is at risk of bile salt diarrhea as well due to his history of prior cholecystectomy.  We are tentatively putting him in for a 2026 colonoscopy recall for colon polyps surveillance.  Certainly if the patient has any symptoms we will need to consider updated colonoscopy as well as fecal calprotectin and stool studies to evaluate for EPI.  He may be at risk of SIBO as well.  Will keep in mind the use of cholestyramine in the future too.  At this point the patient will update [place] if anything else develops or changes in the interim.  All patient questions were answered to the best of my ability, and the patient agrees to the aforementioned plan of action with follow-up as indicated.   PLAN  Colonoscopy recall placed for 2026 If patient has any recurrence of significant diarrheal symptoms that are persisting, he will need updated colonoscopy as well as stool studies for fecal calprotectin and fecal elastase testing Will consider SIBO breath testing as well in future Consider bile salt diarrhea in the setting of his postcholecystectomy status in future as well   No orders of the defined types were placed in this encounter.   New Prescriptions   No medications on file  Modified Medications   No medications on file    Planned Follow Up No follow-ups on  file.   Total Time in Face-to-Face and in Coordination of Care for patient including independent/personal interpretation/review of prior testing, medical history, examination, medication adjustment, communicating results with the patient directly, and documentation within the EHR is 45 minutes.   Corliss Parish, MD  Gastroenterology Advanced Endoscopy Office # [phone]

## 2023-01-04 NOTE — Patient Instructions (Signed)
You will be due for a recall colonoscopy in 11/2024. We will send you a reminder in the mail when it gets closer to that time.  _______________________________________________________  If your blood pressure at your visit was 140/90 or greater, please contact your primary care physician to follow up on this.  _______________________________________________________  If you are age 76 or older, your body mass index should be between 23-30. Your Body mass index is 30.52 kg/m. If this is out of the aforementioned range listed, please consider follow up with your Primary Care Provider.  If you are age 15 or younger, your body mass index should be between 19-25. Your Body mass index is 30.52 kg/m. If this is out of the aformentioned range listed, please consider follow up with your Primary Care Provider.   ________________________________________________________  The [facility] providers would like to encourage you to use [facility] to communicate with providers for non-urgent requests or questions.  Due to long hold times on the telephone, sending your provider a message by [facility] may be a faster and more efficient way to get a response.  Please allow 48 business hours for a response.  Please remember that this is for non-urgent requests.  _______________________________________________________  Thank you for choosing me and [facility].  Dr. Meridee Score

## 2023-01-06 DIAGNOSIS — Z7901 Long term (current) use of anticoagulants: Secondary | ICD-10-CM | POA: Insufficient documentation

## 2023-01-06 DIAGNOSIS — Z86010 Personal history of colonic polyps: Secondary | ICD-10-CM | POA: Insufficient documentation

## 2023-01-06 DIAGNOSIS — Z8719 Personal history of other diseases of the digestive system: Secondary | ICD-10-CM | POA: Insufficient documentation

## 2023-01-06 DIAGNOSIS — Z87898 Personal history of other specified conditions: Secondary | ICD-10-CM | POA: Insufficient documentation

## 2023-01-06 DIAGNOSIS — Z9049 Acquired absence of other specified parts of digestive tract: Secondary | ICD-10-CM | POA: Insufficient documentation

## 2023-02-27 ENCOUNTER — Other Ambulatory Visit: Payer: Self-pay

## 2023-03-02 LAB — SURGICAL PATHOLOGY

## 2023-03-10 ENCOUNTER — Telehealth: Payer: Self-pay | Admitting: Gastroenterology

## 2023-03-10 NOTE — Telephone Encounter (Signed)
PT is experiencing fecal smearing, loose bowels due to Colitis. Requesting call back to discuss symptoms.

## 2023-03-13 NOTE — Telephone Encounter (Signed)
See note from 12/2022 below;  PLAN  Colonoscopy recall placed for 2026 If patient has any recurrence of significant diarrheal symptoms that are persisting, he will need updated colonoscopy as well as stool studies for fecal calprotectin and fecal elastase testing Will consider SIBO breath testing as well in future Consider bile salt diarrhea in the setting of his postcholecystectomy status in future as well   Left message on machine to call back

## 2023-03-14 NOTE — Telephone Encounter (Signed)
Left message on machine to call back  

## 2023-03-15 NOTE — Telephone Encounter (Signed)
Unable to reach pt by phone will await further communication.

## 2023-03-15 NOTE — Telephone Encounter (Signed)
Left message on machine to call back  

## 2023-05-16 ENCOUNTER — Telehealth: Payer: Self-pay | Admitting: Gastroenterology

## 2023-05-16 NOTE — Telephone Encounter (Signed)
 Pt wife Jermario Kalmar) requested a call back to discuss her husbands symptoms from appt 01-04-23 and to see if there is a possibility for her husband to be put on any medication. Wife is requesting to be called at her phone number [phone] please advise.

## 2023-05-17 ENCOUNTER — Other Ambulatory Visit: Payer: Self-pay

## 2023-05-17 DIAGNOSIS — Z8719 Personal history of other diseases of the digestive system: Secondary | ICD-10-CM

## 2023-05-17 DIAGNOSIS — Z87898 Personal history of other specified conditions: Secondary | ICD-10-CM

## 2023-05-17 MED ORDER — MESALAMINE 1.2 G PO TBEC: 4.8000 g | DELAYED_RELEASE_TABLET | Freq: Every day | ORAL | 3 refills | Status: DC

## 2023-05-17 NOTE — Telephone Encounter (Signed)
 Patty, Please have patient provide stool studies as well as updated laboratories as follows: CBC/CMP/ESR/CRP Stool culture/ova parasite/C. difficile/fecal calprotectin/fecal elastase Have him restart his mesalamine . If his inflammatory markers are up and his fecal calprotectin is elevated, we will consider a steroid taper. Schedule with him as able next colonoscopy available (I believe he has had recent back surgery so if there is any concerns about him being able to do transfers or moved, he may need to be done in the hospital (there may be 1 last slot on Monday for him) versus when I return in the [facility] next week as well if no transfer issues. Or await all of this blood work and stool studies and then consider endoscopic evaluation based on how he does when he restarts his mesalamine  (4.8 g daily). Thanks. GM

## 2023-05-17 NOTE — Telephone Encounter (Signed)
 Pt has hx of colitis and has developed explosive diarrhea for the past several months.  This comes and goes. Pt has no bleeding or mucous in the stool.  Some occasional abd pain/cramping.  He was previously on mesalamine  and that seemed to help as long as he took daily. He is not taking it at this time. See notes from the past visit in Aug 2024    If the patient has any recurrence of significant diarrheal symptoms that are persisting, he will need updated colonoscopy as well as stool studies for fecal calprotectin and fecal elastase testing  How would you like to proceed?

## 2023-05-17 NOTE — Telephone Encounter (Signed)
 The pt has been advised of the recommendations- he will have labs and stool studies as soon as able.  He agrees to try the mesalamine- prescription sent  He prefers to wait for appts until all labs and stool studies have resulted.

## 2023-07-14 ENCOUNTER — Telehealth: Payer: Self-pay | Admitting: Gastroenterology

## 2023-07-14 NOTE — Telephone Encounter (Signed)
 Patient's wife called stating the mesalamine medication is too expensive for them to purchase, they're seeking alternative. Patient's wife also stated patient is having really bad colitis problems. Patient's wife was given next available with one of the PA's but denied. Requesting a call back to discuss further.

## 2023-07-14 NOTE — Telephone Encounter (Signed)
 Patients wife returned call, requesting a call back. Please advise.

## 2023-07-14 NOTE — Telephone Encounter (Signed)
 Left message on machine to call back

## 2023-07-17 NOTE — Telephone Encounter (Signed)
 The pt wife called and states that the pt has had worse colitis symptoms- diarrhea that is very watery and several times daily.  The pt is not able to leave the house due to the fecal incontinence.  She tells me that they can not afford mesalamine that was prescribed. He has tried imodium with no relief.  Last office visit was 12/2022 he was advised to complete SIBO but they declined at that time.  She states that they will now completed and return.  (They do have the test on hand).  She continued to say that she feels Dr Meridee Score has done nothing to help the pt despite telling her "I have big plans for your husband". She states after that last visit Dr Meridee Score has "dropped" the pt and done nothing to help.    Stool Studies/colon were recommended in January but pt declined at that time.   I have asked that they come as able to have those completed. The pt agrees.  I will also send to Dr Meridee Score for review.

## 2023-07-18 NOTE — Telephone Encounter (Signed)
 Inbound call from patient's wife, calling for an update on message below. Patient's wife was offered appointment for tomorrow at 10:10AM with Dr. Meridee Score. State patient has had multiple "accidents" and would like to discuss with provider.

## 2023-07-18 NOTE — Telephone Encounter (Signed)
 Patty, I am not sure how else I can help the patient when he did not come in for laboratories and stool studies in an effort of trying to further evaluate things earlier in the year. We had also gone ahead and wanted him to get an updated colonoscopy as you had documented in your last note. It is the first time we hear in weeks as to how he has been doing rather than than reaching out to [place]. I hope he does come in for the agreed-upon laboratories and stool studies. Prepare them that likely a repeat colonoscopy will be required. Once they have given the stool studies we can send in low-dose Lomotil, but we need to see the stool studies having been returned first. If the patient and/or wife feels that they are not getting adequate treatment, they can certainly reach out to other GI groups for other consideration of transfer of care if that is what they would like.  He has already had now 3 other gastroenterologists before me within the last 4 years. GM

## 2023-07-18 NOTE — Telephone Encounter (Signed)
 Patient has appt tomorrow 07/19/23 with Dr. Meridee Score. Patient can discuss at that time.

## 2023-07-19 ENCOUNTER — Encounter: Payer: Self-pay | Admitting: Gastroenterology

## 2023-07-19 ENCOUNTER — Other Ambulatory Visit (INDEPENDENT_AMBULATORY_CARE_PROVIDER_SITE_OTHER)

## 2023-07-19 ENCOUNTER — Ambulatory Visit: Admitting: Gastroenterology

## 2023-07-19 DIAGNOSIS — Z8719 Personal history of other diseases of the digestive system: Secondary | ICD-10-CM | POA: Diagnosis not present

## 2023-07-19 DIAGNOSIS — Z860101 Personal history of adenomatous and serrated colon polyps: Secondary | ICD-10-CM | POA: Diagnosis not present

## 2023-07-19 DIAGNOSIS — Z87898 Personal history of other specified conditions: Secondary | ICD-10-CM

## 2023-07-19 DIAGNOSIS — Z7901 Long term (current) use of anticoagulants: Secondary | ICD-10-CM

## 2023-07-19 DIAGNOSIS — K529 Noninfective gastroenteritis and colitis, unspecified: Secondary | ICD-10-CM | POA: Diagnosis not present

## 2023-07-19 LAB — COMPREHENSIVE METABOLIC PANEL
ALT: 18 U/L (ref 0–53)
AST: 11 U/L (ref 0–37)
Albumin: 4.1 g/dL (ref 3.5–5.2)
Alkaline Phosphatase: 73 U/L (ref 39–117)
BUN: 14 mg/dL (ref 6–23)
CO2: 28 meq/L (ref 19–32)
Calcium: 9.4 mg/dL (ref 8.4–10.5)
Chloride: 105 meq/L (ref 96–112)
Creatinine, Ser: 1.41 mg/dL (ref 0.40–1.50)
GFR: 48.32 mL/min — ABNORMAL LOW (ref >60.00)
Glucose, Bld: 85 mg/dL (ref 70–99)
Potassium: 4 meq/L (ref 3.5–5.1)
Sodium: 141 meq/L (ref 135–145)
Total Bilirubin: 0.9 mg/dL (ref 0.2–1.2)
Total Protein: 6.8 g/dL (ref 6.0–8.3)

## 2023-07-19 LAB — CBC
HCT: 53.2 % — ABNORMAL HIGH (ref 39.0–52.0)
Hemoglobin: 17.9 g/dL — ABNORMAL HIGH (ref 13.0–17.0)
MCHC: 33.6 g/dL (ref 30.0–36.0)
MCV: 93.8 fl (ref 78.0–100.0)
Platelets: 155 10*3/uL (ref 150.0–400.0)
RBC: 5.67 Mil/uL (ref 4.22–5.81)
RDW: 13.7 % (ref 11.5–15.5)
WBC: 6 10*3/uL (ref 4.0–10.5)

## 2023-07-19 LAB — C-REACTIVE PROTEIN: CRP: <1 mg/dL (ref 0.5–20.0)

## 2023-07-19 LAB — TSH: TSH: 1.47 u[IU]/mL (ref 0.35–5.50)

## 2023-07-19 LAB — SEDIMENTATION RATE: Sed Rate: 4 mm/h (ref 0–20)

## 2023-07-19 MED ORDER — MESALAMINE 1.2 G PO TBEC: 4.8000 g | DELAYED_RELEASE_TABLET | Freq: Every day | ORAL | 3 refills | Status: DC

## 2023-07-19 MED ORDER — NA SULFATE-K SULFATE-MG SULF 17.5-3.13-1.6 GM/177ML PO SOLN: 1.0000 | ORAL | 0 refills | Status: DC

## 2023-07-19 NOTE — Progress Notes (Unsigned)
 GASTROENTEROLOGY OUTPATIENT CLINIC VISIT   Primary Care Provider Carl Best, NP-C [address] [place] [address] [phone]   Patient Profile: Benjamin Powell is a 78 y.o. male with a pmh significant for prior CVA, atrial fibrillation (on anticoagulation), hypertension, diabetes, hyperlipidemia, degenerative joint disease of the back, status postcholecystectomy, colon polyps (TA's & SSPs), recurrent diarrhea NOS.  The patient presents to the [facility] for an evaluation and management of problem(s) noted below:  Problem List 1. History of colitis   2. History of diarrhea   3. Hx of adenomatous colonic polyps   4. Chronic anticoagulation      History of Present Illness Please see prior GI notes for full details of HPI.  However, he is considered a new patient as he establish with a new gastroenterologist and then transferred back to this practice and has seen me for the first time.  Interval History This is the patient's first visit the outpatient [facility] in almost 2 years.  He had transitioned his GI care to providers in [place] when previously was followed by Dr. Russella Dar.  I take care of the patient's wife and they have wanted to transition care to here in [place] but initially had insurance issues and are now active with our group.  Today, the patient is feeling well.  He is not having significant GI symptoms at this time.  Patient states for the last 4 to 5 months his diarrheal symptoms are better.  He has not needed to restart budesonide.  When reviewing the patient's chart it looks like the patient was diagnosed with a focal active colitis of unclear etiology back in 2023 when he had his updated colonoscopy done with the digestive health group in [place].  With that being said, he was started on budesonide for potential effect and it was not clear if the patient was getting better so there was consideration of using  mesalamine therapy.  In the patient's last clinic visit from 9/23 it did suggest that he was doing better.  However, when he was referred here initially he was still having significant issues with diarrhea initially but could not see [place] due to insurance issues he was still having significant diarrhea (also patient's wife who is my patient reported significant issues to persisting).  Thankfully, the patient again reiterates that his issues have subsided.  Has not noted any blood in his stools.  He does not have a new bloating or abdominal pain.  His biggest issue is his upcoming surgery for degenerative joint disease/degenerative disc disease of his back.  GI Review of Systems Positive as above Negative for pyrosis, dysphagia, odynophagia, nausea, vomiting, pain, melena, hematochezia  Review of Systems General: Denies fevers/chills/weight loss unintentionally Cardiovascular: Denies chest pain Pulmonary: Denies shortness of breath Gastroenterological: See HPI Genitourinary: Denies darkened urine Hematological: Denies easy bruising/bleeding Endocrine: Denies temperature intolerance Dermatological: Denies skin changes Psychological: Mood is stable   Medications Current Outpatient Medications  Medication Sig Dispense Refill   AMBULATORY NON FORMULARY MEDICATION Medication Name: Aloe Gold probiotic daily     apixaban (ELIQUIS) 5 MG TABS tablet Take 5 mg by mouth 2 (two) times daily.     Cholecalciferol 125 MCG (5000 UT) TABS Take by mouth.     Continuous Glucose Sensor (DEXCOM G7 SENSOR) MISC by Does not apply route.     dapagliflozin propanediol (FARXIGA) 10 MG TABS tablet Take 10 mg by mouth daily.     Insulin Human (INSULIN PUMP) SOLN  Inject into the skin. Omipod 5     INSULIN LISPRO [place] Inject into the skin.     metoprolol succinate (TOPROL XL) 25 MG 24 hr tablet Take 0.5 tablets (12.5 mg total) by mouth daily. Take with or immediately following a meal. 45 tablet 3   Na Sulfate-K Sulfate-Mg  Sulfate concentrate (SUPREP BOWEL PREP KIT) 17.5-3.13-1.6 GM/177ML SOLN Take 1 kit (354 mLs total) by mouth as directed. For colonoscopy prep 354 mL 0   mesalamine (LIALDA) 1.2 g EC tablet Take 4 tablets (4.8 g total) by mouth daily with breakfast. 60 tablet 3   No current facility-administered medications for this visit.    Allergies No Active Allergies  Histories Past Medical History:  Diagnosis Date   Anxiety and depression    BPH with obstruction/lower urinary tract symptoms    Cervical spondylosis    Surgery 2021   Chronic renal insufficiency, stage 3 (moderate) (HCC)    Frequent falls    GERD (gastroesophageal reflux disease)    Hemorrhoids    History of adenomatous polyp of colon    History of CVA (cerebrovascular accident) 10/2016   CTA head and neck with no occlusive dz. +advanced chronic ischemic dz changes on MR   History of DVT (deep vein thrombosis) 2022   L leg.  Hypercoag w/u NEG.   Hypertension    Treatment resistant   Impaired mobility and ADLs    Orthostatic hypotension    OSA on CPAP    PAF (paroxysmal atrial fibrillation) (HCC)    Recurrent nephrolithiasis    Type 2 diabetes with nephropathy [facility])    endocrinologist   Urge incontinence    Past Surgical History:  Procedure Laterality Date   ANKLE SURGERY  1993   left   ANTERIOR CERVICAL DECOMP/DISCECTOMY FUSION N/A 10/02/2019   Procedure: ANTERIOR CERVICAL DECOMPRESSION FUSION CERVICAL THREE-FOUR WITH INSTRUMENTATION AND ALLOGRAFT;  Surgeon: Estill Bamberg, MD;  Location: [facility] OR;  Service: Orthopedics;  Laterality: N/A;   CATARACT EXTRACTION Right    CHOLECYSTECTOMY  1990   COLONOSCOPY     2013 multiple hyperplastic polyps. 04/08/19 Many hyperplastic/inflammatory polyps, adenoma x1.  11/2021->results pending   ERCP  1990   Hammer Toe Repair Right 09/16/2016   RT #5   Partial Excision Phalanx Left 09/16/2016   LT#5   rib fixation  05/2021   rib surgery     3 titanium ribs did break 4    TONSILLECTOMY AND ADENOIDECTOMY  1998   and sinus for sleep apnea   TRANSTHORACIC ECHOCARDIOGRAM  07/07/2017   07/2017 EF 55-60%, mild MR, no shunt. 05/2021 EF normal, valves nl.  11/05/21 EF 60-65%, mild LVH, grd I DD, mild MR   Social History   Socioeconomic History   Marital status: Married    Spouse name: Not on file   Number of children: 2   Years of education: Not on file   Highest education level: Not on file  Occupational History   Not on file  Tobacco Use   Smoking status: Former    Current packs/day: 0.00    Average packs/day: 0.3 packs/day for 50.0 years (15.0 ttl pk-yrs)    Types: Cigarettes    Start date: 08/07/1969    Quit date: 08/08/2019    Years since quitting: 3.9   Smokeless tobacco: Never  Vaping Use   Vaping status: Never Used  Substance and Sexual Activity   Alcohol use: No   Drug use: No   Sexual activity: Not on file  Other  Topics Concern   Not on file  Social History Narrative   Married   Educ: college   Occup:    52 pack-yr tobacco, quit 2022.   No alc/drugs.   Social Drivers of Health   Financial Resource Strain: Medium Risk (12/27/2022)   Received from [facility]   Overall Financial Resource Strain (CARDIA)    Difficulty of Paying Living Expenses: Somewhat hard  Food Insecurity: Low Risk  (06/05/2023)   Received from [facility]   Hunger Vital Sign    Worried About Running Out of Food in the Last Year: Never true    Ran Out of Food in the Last Year: Never true  Transportation Needs: No Transportation Needs (06/05/2023)   Received from [facility]    In the past 12 months, has lack of reliable transportation kept you from medical appointments, meetings, work or from getting things needed for daily living? : No  Physical Activity: Inactive (12/27/2022)   [facility]   Exercise Vital Sign    Days of Exercise per Week: 0 days    Minutes of Exercise per Session: 10 min  Stress: No Stress Concern Present  (04/13/2023)   Received from [facility] - Occupational Stress Questionnaire    Feeling of Stress : Only a little  Social Connections: Somewhat Isolated (12/27/2022)   Received from [facility]   Social Network    How would you rate your social network (family, work, friends)?: Restricted participation with some degree of social isolation  Intimate Partner Violence: Not At Risk (04/13/2023)   Received from [facility]   HITS    Over the last 12 months how often did your partner physically hurt you?: Never    Over the last 12 months how often did your partner insult you or talk down to you?: Never    Over the last 12 months how often did your partner threaten you with physical harm?: Never    Over the last 12 months how often did your partner scream or curse at you?: Never   Family History  Problem Relation Age of Onset   Pancreatic cancer Mother    Cancer Father    Early death Father    Colitis Sister    Diabetes Sister    Stroke Brother    Colitis Brother    Diabetes Brother    Heart attack Brother    Cancer Brother    Stroke Brother    Colon cancer Neg Hx    Esophageal cancer Neg Hx    Rectal cancer Neg Hx    Stomach cancer Neg Hx    I have reviewed his medical, social, and family history in detail and updated the electronic medical record as necessary.    PHYSICAL EXAMINATION  BP (!) 140/82   Pulse 70   Ht 6' (1.829 m)   Wt 228 lb (103.4 kg)   BMI 30.92 kg/m  Wt Readings from Last 3 Encounters:  07/19/23 228 lb (103.4 kg)  01/04/23 225 lb (102.1 kg)  11/26/21 217 lb 6.4 oz (98.6 kg)  GEN: NAD, appears stated age, doesn't appear chronically ill PSYCH: Cooperative, without pressured speech EYE: Conjunctivae pink, sclerae anicteric ENT: MMM CV: Nontachycardic RESP: No audible wheezing GI: NABS, soft, NT/ND, without rebound or guarding, no HSM appreciated MSK/EXT: No significant lower extremity edema SKIN: No  jaundice NEURO:  Alert & Oriented x 3, no focal deficits   REVIEW  OF DATA  I reviewed the following data at the time of this encounter:  GI Procedures and Studies  July 2023 colonoscopy outside provider True colonoscopy report we cannot see we will try to get report GI outside documentation suggest that he had focal active colitis diffusely adenomatous colon polyps and a 3-year recall was given  Pathology DIAGNOSIS  1. COLON, "SIGMOID COLON"-POLYPECTOMY:  HYPERPLASTIC POLYP WITH MUCOSAL PROLAPSE RELATED CHANGES.  2. COLON, "HEPATIC FLEXURE"-POLYPECTOMY:  TUBULAR ADENOMA, NEGATIVE FOR HIGH GRADE DYSPLASIA.  SESSILE SERRATED LESION,  NEGATIVE FOR DYSPLASIA.   INFLAMMATORY GRANULATION TISSUE POLYP, NEGATIVE FOR  DYSPLASIA.  3. COLON, "CECUM"-POLYPECTOMY:  BENIGN POLYPOID COLONIC MUCOSA, NEGATIVE FOR DYSPLASIA.  4. COLON, "ENTIRE COLON"-BIOPSY:  FOCAL ACTIVE COLITIS, NEGATIVE FOR DYSPLASIA.  COMMENT: DIFFERENTIAL CONSIDERATIONS INCLUDE BOWEL PREPARATION INJURY, EARLY  ISCHEMIC INJURY, ACUTE PHASE OF INFLAMMATORY BOWEL DISEASE, ACUTE SELF LIMITED  INFECTIOUS COLITIS, AND DRUG INJURY (NSAIDS).  (CRR)   Laboratory Studies  Reviewed those in epic and Care Everywhere  Imaging Studies  1/23 outside CT chest/abdomen/pelvis (performed after a fall) FINDINGS:  CT ABDOMEN PELVIS  Liver: Unremarkable  Gallbladder and Biliary Tree: Unremarkable  Pancreas: Unremarkable  Spleen: Unremarkable  Adrenal Glands: Unremarkable  Kidneys and Urinary Bladder: Subcentimeter nonobstructing calculus lower pole right kidney.  Reproductive: Mild prostatomegaly.  Abdominal Aorta: Unremarkable  Lymph Nodes: Unremarkable  Peritoneum: No free fluid. No free air.  No abscess.  Gastrointestinal Tract: No bowel obstruction.  No evidence for acute appendicitis.  Abdominal and Pelvic Walls: Unremarkable  Bones: Unremarkable    ASSESSMENT  Mr. Jumonville is a 78 y.o. male with a pmh significant for prior  CVA, atrial fibrillation (on anticoagulation), hypertension, diabetes, hyperlipidemia, degenerative joint disease of the back, status postcholecystectomy, colon polyps (TA's & SSPs), recurrent diarrhea NOS.  The patient is seen today for evaluation and management of:  1. History of colitis   2. History of diarrhea   3. Hx of adenomatous colonic polyps   4. Chronic anticoagulation     The patient is hemodynamically and clinically stable from a GI perspective at this time.  Thankfully is not having any recurrence of his diarrhea.  Patient had focal active colitis throughout his colon on last colonoscopy in 2023.  I would be concerned that if the patient has recurrent symptoms then updated colonoscopy may need to be required to see if there truly is any evidence of chronicity of changes.  As well, it is never was clear that the patient had lymphocytic colitis but budesonide therapy was used as well as a short course of mesalamine therapy.  He is now off both of these.  Patient is at risk of bile salt diarrhea as well due to his history of prior cholecystectomy.  We are tentatively putting him in for a 2026 colonoscopy recall for colon polyps surveillance.  Certainly if the patient has any symptoms we will need to consider updated colonoscopy as well as fecal calprotectin and stool studies to evaluate for EPI.  He may be at risk of SIBO as well.  Will keep in mind the use of cholestyramine in the future too.  At this point the patient will update [place] if anything else develops or changes in the interim.  All patient questions were answered to the best of my ability, and the patient agrees to the aforementioned plan of action with follow-up as indicated.   PLAN  Colonoscopy recall placed for 2026 If patient has any recurrence of significant diarrheal symptoms that are persisting, he  will need updated colonoscopy as well as stool studies for fecal calprotectin and fecal elastase testing Will consider SIBO  breath testing as well in future Consider bile salt diarrhea in the setting of his postcholecystectomy status in future as well   Orders Placed This Encounter  Procedures   Stool Culture   Ova and parasite examination   Clostridium difficile Toxin B, Qualitative, Real-Time PCR   CALPROTECTIN   Tissue Transglutaminase Abs,IgG,IgA   IgA   TSH   CBC   Comp Met (CMET)   C-reactive protein   Sedimentation rate   C-reactive protein   Pancreatic elastase, fecal   Ambulatory referral to Gastroenterology    New Prescriptions   NA SULFATE-K SULFATE-MG SULFATE CONCENTRATE (SUPREP BOWEL PREP KIT) 17.5-3.13-1.6 GM/177ML SOLN    Take 1 kit (354 mLs total) by mouth as directed. For colonoscopy prep   Modified Medications   Modified Medication Previous Medication   MESALAMINE (LIALDA) 1.2 G EC TABLET mesalamine (LIALDA) 1.2 g EC tablet      Take 4 tablets (4.8 g total) by mouth daily with breakfast.    Take 4 tablets (4.8 g total) by mouth daily with breakfast.    Planned Follow Up No follow-ups on file.   Total Time in Face-to-Face and in Coordination of Care for patient including independent/personal interpretation/review of prior testing, medical history, examination, medication adjustment, communicating results with the patient directly, and documentation within the EHR is 45 minutes.   Corliss Parish, MD [facility] Office # [phone]

## 2023-07-19 NOTE — Patient Instructions (Addendum)
 Your provider has requested that you go to the basement level for lab work before leaving today. Press "B" on the elevator. The lab is located at the first door on the left as you exit the elevator.  We have sent the following medications to your pharmacy for you to pick up at your convenience: Suprep   You have been scheduled for an endoscopy. Please follow written instructions given to you at your visit today.  If you use inhalers (even only as needed), please bring them with you on the day of your procedure.  If you take any of the following medications, they will need to be adjusted prior to your procedure:   DO NOT TAKE 7 DAYS PRIOR TO TEST- Trulicity (dulaglutide) Ozempic, Wegovy (semaglutide) Mounjaro (tirzepatide) Bydureon Bcise (exanatide extended release)  DO NOT TAKE 1 DAY PRIOR TO YOUR TEST  Rybelsus (semaglutide) Adlyxin (lixisenatide) Victoza (liraglutide) Byetta (exanatide) ___________________________________________________________________________  Due to recent changes in healthcare laws, you may see the results of your imaging and laboratory studies on MyChart before your provider has had a chance to review them.  We understand that in some cases there may be results that are confusing or concerning to you. Not all laboratory results come back in the same time frame and the provider may be waiting for multiple results in order to interpret others.  Please give [place] 48 hours in order for your provider to thoroughly review all the results before contacting the office for clarification of your results.   Thank you for choosing me and [facility].  Dr. Meridee Score

## 2023-07-20 ENCOUNTER — Telehealth: Payer: Self-pay | Admitting: Gastroenterology

## 2023-07-20 LAB — TISSUE TRANSGLUTAMINASE ABS,IGG,IGA
(tTG) Ab, IgA: <1 U/mL
(tTG) Ab, IgG: <1 U/mL

## 2023-07-20 LAB — IGA: Immunoglobulin A: 643 mg/dL — ABNORMAL HIGH (ref 70–320)

## 2023-07-20 NOTE — Telephone Encounter (Signed)
 Patient wife called and stated that her husband was put on medication to control his diarrhea. Patient wife also stated that her husband is taking 4 tablets a day but he is still experiencing a blow up in the evening. Patient wife feels like this medication is not working for him, because he can't even go out without having a blow out. Patient wife is requesting a call back. Please advise.

## 2023-07-21 ENCOUNTER — Encounter: Payer: Self-pay | Admitting: Gastroenterology

## 2023-07-21 NOTE — Telephone Encounter (Signed)
 Left message on machine to call back

## 2023-07-24 NOTE — Telephone Encounter (Signed)
 The pt wife returned call and states that she believes the medication has begun to work and the pt is having a decrease in stools. She will call back if the diarrhea does not continue to improve.

## 2023-07-24 NOTE — Telephone Encounter (Signed)
 Left message on machine to call back

## 2023-08-22 ENCOUNTER — Encounter: Payer: Self-pay | Admitting: Gastroenterology

## 2023-08-28 ENCOUNTER — Other Ambulatory Visit: Payer: Self-pay

## 2023-08-28 ENCOUNTER — Telehealth: Payer: Self-pay

## 2023-08-28 MED ORDER — NA SULFATE-K SULFATE-MG SULF 17.5-3.13-1.6 GM/177ML PO SOLN: 1.0000 | ORAL | 0 refills | Status: DC

## 2023-08-28 NOTE — Telephone Encounter (Signed)
 Patient wife called and stated that she was Speak with Ro regarding her husband prep medication. Patient wife stated that they received a message from [facility] and that they would have to pay 50 dollars and right now they are not able to pay for that. Patient wife statted if it can go to [facility] so she can see if it is cheaper there. Patient wife is requesting a call back. Please advise.

## 2023-08-28 NOTE — Telephone Encounter (Signed)
 Inbound call from patient, following up on request below. Procedure is scheduled for 4/23.

## 2023-08-28 NOTE — Telephone Encounter (Signed)
 Spoke with patients wife again, she needs Suprep to be re-sent to [facility] and she can not find prep instructions that were given in the office. Rx sent to pharmacy and instructions emailed to [email or web address]. Wife states that she did speak with Endocrinology and have been given instructions on how to adjust insulin  pump.

## 2023-08-28 NOTE — Telephone Encounter (Signed)
 Called patient back to tell the wife I can give her a SuTab sample kit. No Answer left on voicemail to return my call

## 2023-08-28 NOTE — Telephone Encounter (Signed)
 Per Dr Marcella Serge okay for patient to hold Eliquis x2 days prior to procedure. Last dose will be today. Letter was sent to be scanned into media.   Patient's wife was instructed to contact [facility] for Endocrinology to get instructions on how to adjust insulin  pump, as they have not responded to 4 faxes and voicemail left for [place], [place] at their office.

## 2023-08-30 ENCOUNTER — Encounter: Admitting: Gastroenterology

## 2023-09-28 ENCOUNTER — Encounter: Payer: Self-pay | Admitting: Gastroenterology

## 2023-09-29 NOTE — Progress Notes (Addendum)
 Subjective:    History of Present Illness: [name] is a 78 y.o. male with PMH of afib and DVT on Eliquis who presents for Neurosurgical evaluation of neck pain. He has a history of C3-4 ACDF on 10/02/19 with Dr. Oneil Powell.  He reports prior to surgery he had neck pain and stiffness.  After surgery this improved until he fell last year.  Since the fall he has had continued and worsening neck pain more on the left side with stiffness and gait instability.  He says the neck pain is fairly constant.  He denies any upper extremity radiculopathy or numbness.  He does report some weakness/dexterity issues in his hands.  He reports lower extremity numbness but has a history of lumbar spine surgery as well.  He does not take anything for pain.  He has not had any conservative treatment as far as physical therapy or injections ago.  He is accompanied by his wife.   The following portions of the patient's history were reviewed and updated as appropriate: allergies, current medications, past family history, past medical history, past social history, past surgical history and problem list.  Past Medical History:  Diagnosis Date  . Allergy   . Anxiety   . Anxiety   . Boil    recurrent  . BPH (benign prostatic hyperplasia)   . Degenerative disc disease, cervical   . Depression   . Diabetes mellitus    (CMD)   . GERD (gastroesophageal reflux disease)   . Hypertension associated with diabetes    (CMD)   . Hypogonadism in male   . Impacted cerumen of left ear   . Lumbar degenerative disc disease   . Microalbuminuric diabetic nephropathy    (CMD)   . OSA (obstructive sleep apnea)   . Osteoarthritis of both knees   . Posterior vitreous detachment of left eye   . Sebaceous cyst   . Spondylosis of cervical spine   . Stroke    (CMD)     Past Surgical History:  Procedure Laterality Date  . ANKLE FRACTURE SURGERY     Procedure: ANKLE FRACTURE SURGERY  . CATARACT EXTRACTION Right     Procedure: CATARACT EXTRACTION  . CHOLECYSTECTOMY     Procedure: CHOLECYSTECTOMY  . ERCP     Procedure: ERCP  . FINGER OSTEOTOMY Left 08/25/2022   possilbe osteotomy performed by Benjamin Ozell Christen, MD at [facility] OR  . HAND CONTRACTURE RELEASE Left 08/25/2022   open partial fasciectomy, contracture release, percutanous pinning of small finger performed by Benjamin Ozell Christen, MD at [facility] OR  . OTHER SURGICAL HISTORY Right    Procedure: OTHER SURGICAL HISTORY (hammer toe repair)  . OTHER SURGICAL HISTORY Left    Procedure: OTHER SURGICAL HISTORY (partial excision phalanx)  . OTHER SURGICAL HISTORY     Procedure: OTHER SURGICAL HISTORY (anterior cervical decomp/disectomy fusion)  . RIB FRACTURE SURGERY Right 06/03/2021   Procedure: RIB FIXATION;  Surgeon: Benjamin Merilee Riedel, MD;  Location: [facility] MAIN OR;  Service: General;  Laterality: Right;  right lateral decubitus. Dual lung ventilation  . SINUS SURGERY     Procedure: SINUS SURGERY  . TONSILLECTOMY     Procedure: TONSILLECTOMY     Review of Systems:   Review of Systems  Musculoskeletal:  Positive for arthralgias, myalgias, neck pain and neck stiffness.  Neurological:  Positive for weakness and numbness.     Objective:    Physical Exam: Physical Exam Vitals reviewed.  Constitutional:  Appearance: Normal appearance.  HENT:     Head: Normocephalic.     Nose: Nose normal.  Pulmonary:     Effort: Pulmonary effort is normal.   Skin:    General: Skin is warm and dry.   Neurological:     Mental Status: He is alert.     Deep Tendon Reflexes:     Reflex Scores:      Tricep reflexes are 0 on the right side and 0 on the left side.      Bicep reflexes are 0 on the right side and 0 on the left side.      Brachioradialis reflexes are 0 on the right side and 0 on the left side.  Psychiatric:        Speech: Speech normal.    Neurological Exam Mental Status Alert. Recent and remote  memory are intact. Speech is normal. Language is fluent with no aphasia.  Motor Normal muscle bulk throughout.                                             Right                     Left Deltoid                                   5                          5  Biceps                                   5                          5  Triceps                                  5                          5  Iliopsoas                               5                          5  Sensory Light touch is normal in upper and lower extremities.   Reflexes                                           Right                      Left Brachioradialis                    0  0 Biceps                                 0                         0 Triceps                                0                         0 Patellar                                Tr                         Tr  Right pathological reflexes: Hoffmann's absent. Left pathological reflexes: Hoffmann's absent.  Gait  Wide based.   Vitals:  Vitals:   10/17/23 0901  BP: 152/78  Pulse: 67  Temp: 97.7 F (36.5 C)  SpO2: 94%     MR Cervical IMPRESSION:   Please note, image quality is significantly degraded on portions of this exam by patient motion, particularly the axial and sagittal STIR sequences.   1.  Degenerative changes at C2-C3 result in advanced spinal canal stenosis with circumferential effacement of CSF and mild flattening of the cord with moderate to advanced right foraminal stenosis. No definite associated cord signal abnormality, although assessment is limited by motion. 2.  Surgical changes of prior instrumented C3-C4 ACDF with osseous fusion likely extending from C4 to C7. Dorsal osseous ridging at these levels minimally deforms the ventral thecal sac without high-grade canal stenosis; however, there are varying degrees of mild to moderate foraminal stenosis, notably on the right at C4-C5 and C5-C6. 3.   Adjacent level degenerative changes at C7-T1 with advanced right greater than left facet hypertrophy resulting in advanced right foraminal stenosis. Assessment:   1. Status post cervical spinal fusion  XR Spine Cervical Complete 4-5 Views    2. Neck pain  XR Spine Cervical Complete 4-5 Views    3. Cervical spinal stenosis       Patient presents for evaluation of cervical stenosis and neck pain.  He has a history of an anterior cervical discectomy and fusion C3-C4.  He reports chronic neck pain with stiffness and gait instability over the last year.  He does report some dexterity issues in his hands but has no upper extremity radiculopathy or numbness.  I reviewed the cervical MRI with him which shows adjacent level disease at C2-3.  He has foraminal narrowing worse on the right.  He is auto-fused C4-7.  I recommended he obtain dynamic cervical x-rays and I will plan to review his imaging with Dr. Tanda to get his recommendations.  We did discuss he may benefit from physical therapy and injections but he would like Dr. Luigi recommendations.  I will get back with him in the near future. Plan:  Obtain x-rays Review imaging with Dr. Tanda and update patient with plan of care  Addendum: Reviewed imaging with Dr. Tanda. No surgery is recommended. He is likely stiff related to degenerative changes and auto-fusion. Should try conservative treatment. Will update patient with plan of care.

## 2023-10-07 ENCOUNTER — Other Ambulatory Visit: Payer: Self-pay | Admitting: Gastroenterology

## 2023-10-17 NOTE — Progress Notes (Signed)
 In general, would you say your health is:  fair  In general, would you say your quality of life is: fair  In general, how would you rate your physical health?  fair  In general, how would you rate your mental health, including your mood and your ability to think?  good  In general, how would you rate your satisfaction with your social activities and relationships? good  In general, please rate how well you carry out your usual social activities and roles. 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 Moderately  In the past 7 days. . .  How often have you been bothered by emotional problems such as feeling anxious, depressed or irritable?  Sometimes  How would you rate your fatigue on average?  Moderate  How would you rate your pain on average?  5

## 2023-10-19 NOTE — Telephone Encounter (Signed)
 Called patient to let him know Dr. Tanda did not recommend surgery. He recommended conservative treatment. Offered a referral to PT and pain management. He is going to think about that. Discussed red flag s/s of when to follow up.

## 2023-10-30 NOTE — Progress Notes (Signed)
 Outpatient Cardiology Progress note   HPI    History of Present Illness:  Benjamin Powell is a 78 y.o. male who presents for follow-up.    Prior HPI:  He has atrial fibrillation.  He was hospitalized at [facility]. Was found to have atrial fibrillation with RVR complicated by type II MI and orthostatic hypotension. During his hospitalization cardioversion was planned however he spontaneously converted to sinus rhythm.  NM stress was negative for ischemia.  Echocardiogram demonstrated normal LVEF with grade 1 diastolic dysfunction.  Prescribed flecainide and metoprolol .  He did not tolerate flecainide and this was discontinued.   He wore a Holter monitor in May 2024 that demonstrated underlying sinus rhythm.  Short, self-limiting runs of SVT, no A-fib was reported.   He has frequent falls and is injured himself pretty significantly, fractured ribs and severe bruising and contusions on his face and arms. Unprovoked LLE DVT 2 years ago.  He has type 2 diabetes which should been poorly controlled, recent A1c was 7.3 No history of coronary disease.  Continues to experience chronic orthostatic dizziness.  Recent MRI demonstrated multiple old small strokes.   He has worn a heart monitor twice in the past year following falls. He maintains adequate hydration, consuming approximately 60 ounces of water daily.He is scheduled to start physical therapy following back surgery performed 9 weeks ago. He has been more sedentary since his back surgery.   History of Present Illness The patient is a 78 year old male presenting for a follow-up visit.  Blood pressure readings have been within normal range in the mornings but tend to elevate during the day, particularly in the evening, even after administration of antihypertensive medication. Last night, the blood pressure reading was 167/88. Systolic blood pressure typically remains high, while diastolic pressure has recently increased and  consistently stays above 90. Blood pressure rarely falls into the 120s or 130s, usually hovering around 150 or higher. No chest pain, pressure, or heaviness is reported. He also reports no palpitations, leg swelling, or breathing difficulties. He leads a sedentary lifestyle, primarily watching television. He has been on metoprolol  for over 20 years, initially at a dose of 50 mg which was later reduced to 25 mg during a hospital stay. Currently, he is on lisinopril  20 mg twice daily and metoprolol  25 mg once daily in the morning. He experienced gingival hyperplasia with amlodipine , necessitating its discontinuation.  A history of atrial fibrillation (AFib) is noted, but it is not a frequent occurrence. No symptoms related to AFib are experienced, even during episodes of syncope.  A history of deep vein thrombosis (DVT) in the left leg is reported, for which anticoagulants were prescribed. He is under the care of Dr. Beryl, a vascular specialist, for discoloration in his feet, which are turning black. No numbness or tingling in the feet is reported, but occasional tingling in the hands is noted. 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  SOCIAL HISTORY - Occupation: Holiday representative - Exercise: Not much physical activity    Exam  Heart Rate:  [81] 81 BP: (124)/(70) 124/70 SpO2:  [95 %] 95 %  Review of Systems  Constitutional:  Negative for chills and fever.  Respiratory:  Negative for chest tightness and shortness of breath.   Cardiovascular:  Negative for chest pain, palpitations and leg swelling.  Musculoskeletal:  Negative for arthralgias and joint swelling.  Neurological:  Negative for dizziness, syncope and light-headedness.    Physical Exam Constitutional:  Appearance: Normal appearance.  Cardiovascular:     Rate and Rhythm: Normal rate and regular rhythm.  Musculoskeletal:     Right lower leg: No edema.     Left lower leg: No edema.   Neurological:     Mental Status: He is alert.       Assessment/Plan   I have personally reviewed the patients active medications.  I reviewed prior chemistry profile, hemoglobin A1c, CBC I reviewed prior internal medicine, GI notes  I have reviewed the following:   Results - Labs:   - Creatinine: 1.29 mg/dL   - Potassium: 4.2 mmol/L   - BUN: 21 mg/dL    Assessment & Plan  1. Hypertension: Chronic. - Blood pressure readings have been consistently elevated, particularly in the evenings, despite taking lisinopril  20 mg twice a day and metoprolol  25 mg once a day in the morning. - Metoprolol  will be replaced with carvedilol 6.25 mg twice daily, which should provide better blood pressure control throughout the day. - A 30-day prescription for carvedilol will be sent to the pharmacy. - Engage in physical activities such as walking for approximately 30 minutes, 3 to 4 days per week, preferably in the early morning or late evening hours. - Maintain a log of blood pressure readings and submit it within the next few weeks. - If carvedilol is well tolerated, the prescription will be extended for a longer duration.  2. Atrial fibrillation: Stable. - No current symptoms such as palpitations, chest pain, or pressure. - Last heart monitor did not show any atrial fibrillation. - Continue to be monitored for any symptoms. - Condition will be reassessed during the next visit.  3. Varicose veins: Chronic. - Presents with varicose veins and spider veins in the lower extremities, likely due to age, venous insufficiency.  - No treatment necessary at this time as there are no concerning symptoms such as swelling, pain, or sensory deficits. - Use compression socks if experiencing swelling, especially when standing for long periods or during flights.  Follow-up - Follow up in 4 months.         Results  ECG: No results found.  Echo: No results found.    Electronically Signed: Redell DELENA Montenegro, MD 10/30/2023 10:07 AM

## 2023-11-08 ENCOUNTER — Encounter: Payer: Self-pay | Admitting: Gastroenterology

## 2023-11-08 ENCOUNTER — Ambulatory Visit: Admitting: *Deleted

## 2023-11-08 ENCOUNTER — Telehealth: Payer: Self-pay | Admitting: *Deleted

## 2023-11-08 VITALS — Ht 72.0 in | Wt 230.0 lb

## 2023-11-08 DIAGNOSIS — J189 Pneumonia, unspecified organism: Secondary | ICD-10-CM

## 2023-11-08 MED ORDER — SUTAB 1479-225-188 MG PO TABS
ORAL_TABLET | ORAL | 0 refills | Status: DC
Start: 2023-11-08 — End: 2023-11-17

## 2023-11-08 NOTE — Progress Notes (Signed)
 Pt's name and DOB verified at the beginning of the pre-visit wit 2 identifiers  Wife is in hospital  and could not be part of the call. Wife was informed on 08/28/23 to obtain insulin  pump instructions but pt stated he did not have any. Instructed pt to have wife call main # if she has any questions or concerns.  Pt denies any difficulty with ambulating,sitting, laying down or rolling side to side  Pt has no issues moving head neck or swallowing  No egg or soy allergy known to patient   No issues known to pt with past sedation with any surgeries or procedures  No FH of Malignant Hyperthermia  Pt is not on home 02   Pt is not on blood thinners   Pt denies issues with constipation   Pt is not on dialysis  Pt denise any abnormal heart rhythms   Pt denies any upcoming cardiac testing  Chart not reviewed by CRNA prior to PV  Visit by phone  Pt states weight is 230 lb   IInstructions reviewed. Pt given  both [facility] main # and MD on call # prior to instructions.  Pt states understanding of instructions. Instructed pt to review instructions again prior to procedure and call main # given if has questions.. Pt states they will.   Instructed pt on where to find instructions on My Chart.

## 2023-11-08 NOTE — Telephone Encounter (Signed)
 Attempt to reach pt for pre-visit. LM with call back #.   Will attempt other number in profile   Second attempt to reaced pt

## 2023-11-17 ENCOUNTER — Telehealth: Payer: Self-pay | Admitting: Gastroenterology

## 2023-11-17 MED ORDER — SUTAB 1479-225-188 MG PO TABS: ORAL_TABLET | ORAL | 0 refills | Status: DC

## 2023-11-17 MED ORDER — SUTAB 1479-225-188 MG PO TABS
ORAL_TABLET | ORAL | 0 refills | Status: DC
Start: 2023-11-17 — End: 2023-11-17

## 2023-11-17 NOTE — Telephone Encounter (Signed)
 Patient called and stated that he has still not received his prep medication form the pharmacy. Patient is scheduled to have a colonoscopy on July the 15 th. Please advise.

## 2023-11-17 NOTE — Telephone Encounter (Signed)
 Infromed pt that prep was sent to the pharmacy listed in his chart. No other questions at this time.

## 2023-11-17 NOTE — Telephone Encounter (Signed)
 Informed pt rx sent to pharmacy they requested. [facility]

## 2023-11-17 NOTE — Telephone Encounter (Signed)
 Patient wife called and stated that she would like to have the nurse go over patient procedure instruction. A good call back number is [phone] . Please advise.

## 2023-11-17 NOTE — Telephone Encounter (Signed)
 Patient wife stated that her husband prep medication should be sent over to [facility] on [address] in [place]. Please advise.

## 2023-11-20 ENCOUNTER — Telehealth: Payer: Self-pay | Admitting: Gastroenterology

## 2023-11-20 NOTE — Telephone Encounter (Signed)
 PT wife is calling about the instructions for the procedure on tomorrow. I tried to go through the information with them. She stated the PT does not have access to MyChart and she wants to make sure they are doing everything correct. Please advise.

## 2023-11-20 NOTE — Telephone Encounter (Signed)
 Returned call to patient, VM obtained and message left that RN was returning phone call and if pt/wife still needed to talk with someone, to call back.

## 2023-11-21 ENCOUNTER — Encounter: Payer: Self-pay | Admitting: Gastroenterology

## 2023-11-21 ENCOUNTER — Ambulatory Visit (AMBULATORY_SURGERY_CENTER): Admitting: Gastroenterology

## 2023-11-21 VITALS — BP 167/84 | HR 68 | Temp 97.6°F | Resp 10 | Ht 72.0 in | Wt 230.0 lb

## 2023-11-21 DIAGNOSIS — Z1211 Encounter for screening for malignant neoplasm of colon: Secondary | ICD-10-CM

## 2023-11-21 DIAGNOSIS — D128 Benign neoplasm of rectum: Secondary | ICD-10-CM

## 2023-11-21 DIAGNOSIS — K5730 Diverticulosis of large intestine without perforation or abscess without bleeding: Secondary | ICD-10-CM | POA: Diagnosis not present

## 2023-11-21 DIAGNOSIS — D123 Benign neoplasm of transverse colon: Secondary | ICD-10-CM

## 2023-11-21 DIAGNOSIS — D122 Benign neoplasm of ascending colon: Secondary | ICD-10-CM

## 2023-11-21 DIAGNOSIS — Z860101 Personal history of adenomatous and serrated colon polyps: Secondary | ICD-10-CM

## 2023-11-21 DIAGNOSIS — K642 Third degree hemorrhoids: Secondary | ICD-10-CM | POA: Diagnosis not present

## 2023-11-21 DIAGNOSIS — K644 Residual hemorrhoidal skin tags: Secondary | ICD-10-CM | POA: Diagnosis not present

## 2023-11-21 DIAGNOSIS — Z87898 Personal history of other specified conditions: Secondary | ICD-10-CM

## 2023-11-21 DIAGNOSIS — Z8719 Personal history of other diseases of the digestive system: Secondary | ICD-10-CM

## 2023-11-21 MED ORDER — SODIUM CHLORIDE 0.9 % IV SOLN: 500.0000 mL | Freq: Once | INTRAVENOUS | Status: DC

## 2023-11-21 NOTE — Op Note (Signed)
 [facility] Patient Name: Benjamin Powell Procedure Date: 11/21/2023 10:29 AM MRN: [id number] Endoscopist: Aloha Finner , MD, [id number] Age: 78 Referring MD:  Date of Birth: 01-21-1946 Gender: Male Account #: [id number] Procedure:                Colonoscopy Indications:              High risk colon cancer surveillance: Personal                            history of adenoma less than 10 mm in size, High                            risk colon cancer surveillance: Personal history of                            sessile serrated colon polyp (less than 10 mm in                            size) with no dysplasia, Incidental - Chronic                            diarrhea (concern on prior biopsies for chronic                            colitis versus lymphocytic colitis -ineffective                            prior therapies and recurrent therapies) Medicines:                Monitored Anesthesia Care Procedure:                Pre-Anesthesia Assessment:                           - Prior to the procedure, a History and Physical                            was performed, and patient medications and                            allergies were reviewed. The patient's tolerance of                            previous anesthesia was also reviewed. The risks                            and benefits of the procedure and the sedation                            options and risks were discussed with the patient.                            All questions were answered, and informed consent  was obtained. Prior Anticoagulants: The patient has                            taken Eliquis (apixaban), last dose was 2 days                            prior to procedure. ASA Grade Assessment: III - A                            patient with severe systemic disease. After                            reviewing the risks and benefits, the patient was                            deemed in  satisfactory condition to undergo the                            procedure.                           After obtaining informed consent, the colonoscope                            was passed under direct vision. Throughout the                            procedure, the patient's blood pressure, pulse, and                            oxygen saturations were monitored continuously. The                            Olympus Scope SN: [id number] was introduced through                            the anus and advanced to the the cecum, identified                            by appendiceal orifice and ileocecal valve. The                            colonoscopy was performed without difficulty. The                            patient tolerated the procedure. The quality of the                            bowel preparation was fair. The ileocecal valve,                            appendiceal orifice, and rectum were photographed. Scope In: 10:35:15 AM Scope Out: 11:00:50 AM Scope Withdrawal Time: 0 hours 21 minutes 32 seconds  Total  Procedure Duration: 0 hours 25 minutes 35 seconds  Findings:                 The digital rectal exam findings include                            hemorrhoids. Pertinent negatives include no                            palpable rectal lesions.                           The colon (entire examined portion) was                            significantly redundant.                           Extensive amounts of semi-liquid stool was found in                            the entire colon, interfering with visualization.                            Lavage of the area was performed using copious                            amounts, resulting in clearance with fair                            visualization.                           12 sessile polyps were found in the rectum (1),                            transverse colon (7) and ascending colon (5). The                            polyps were 2  to 15 mm in size. These polyps were                            removed with a cold snare. Resection and retrieval                            were complete.                           Multiple small-mouthed diverticula were found in                            the recto-sigmoid colon and sigmoid colon.                           Normal appearing mucosa was found in the entire  colon. Due to history of chronic colitis decision                            made to do surveillance biopsies and also evaluate                            for activity. Biopsies were taken from the right                            colon with a cold forceps for histology. Biopsies                            were taken from the left colon with a cold forceps                            for histology. Biopsies were taken from the rectum                            with a cold forceps for histology.                           Non-bleeding non-thrombosed external and internal                            hemorrhoids were found during retroflexion, during                            perianal exam and during digital exam. The                            hemorrhoids were Grade III (internal hemorrhoids                            that prolapse but require manual reduction). Complications:            No immediate complications. Estimated Blood Loss:     Estimated blood loss was minimal. Impression:               - Stool in the entire examined colon. Lavaged                            copiously with fair preparation.                           - Hemorrhoids found on digital rectal exam.                           - Redundant colon.                           - 12, 2 to 15 mm polyps in the rectum, in the                            transverse colon and in the ascending colon,  removed with a cold snare. Resected and retrieved.                           - Diverticulosis in the recto-sigmoid  colon and in                            the sigmoid colon.                           - Normal mucosa in the entire examined colon.                            Biopsied.                           - Non-bleeding non-thrombosed external and internal                            hemorrhoids. Recommendation:           - The patient will be observed post-procedure,                            until all discharge criteria are met.                           - Discharge patient to home.                           - Patient has a contact number available for                            emergencies. The signs and symptoms of potential                            delayed complications were discussed with the                            patient. Return to normal activities tomorrow.                            Written discharge instructions were provided to the                            patient.                           - High fiber diet.                           - Use FiberCon 1-2 tablets PO daily.                           - May restart Eliquis on 7/17 to decrease risk of                            post  interventional bleeding.                           - Continue present medications.                           - Await pathology results.                           - Repeat colonoscopy within the next year because                            the bowel preparation was suboptimal will need                            2-day preparation.                           - The findings and recommendations were discussed                            with the patient.                           - The findings and recommendations were discussed                            with the patient's family. Aloha Finner, MD 11/21/2023 11:09:17 AM

## 2023-11-21 NOTE — Progress Notes (Signed)
 Called to room to assist during endoscopic procedure.  Patient ID and intended procedure confirmed with present staff. Received instructions for my participation in the procedure from the performing physician.

## 2023-11-21 NOTE — Progress Notes (Signed)
 Report to PACU, RN, vss, BBS= Clear.

## 2023-11-21 NOTE — Patient Instructions (Signed)
 May restart Eliquis on Thursday, 7/17 as normal   YOU HAD AN ENDOSCOPIC PROCEDURE TODAY AT THE [facility]:   Refer to the procedure report that was given to you for any specific questions about what was found during the examination.  If the procedure report does not answer your questions, please call your gastroenterologist to clarify.  If you requested that your care partner not be given the details of your procedure findings, then the procedure report has been included in a sealed envelope for you to review at your convenience later.  YOU SHOULD EXPECT: Some feelings of bloating in the abdomen. Passage of more gas than usual.  Walking can help get rid of the air that was put into your GI tract during the procedure and reduce the bloating. If you had a lower endoscopy (such as a colonoscopy or flexible sigmoidoscopy) you may notice spotting of blood in your stool or on the toilet paper. If you underwent a bowel prep for your procedure, you may not have a normal bowel movement for a few days.  Please Note:  You might notice some irritation and congestion in your nose or some drainage.  This is from the oxygen used during your procedure.  There is no need for concern and it should clear up in a day or so.  SYMPTOMS TO REPORT IMMEDIATELY:  Following lower endoscopy (colonoscopy or flexible sigmoidoscopy):  Excessive amounts of blood in the stool  Significant tenderness or worsening of abdominal pains  Swelling of the abdomen that is new, acute  Fever of 100F or higher  For urgent or emergent issues, a gastroenterologist can be reached at any hour by calling [phone]. Do not use MyChart messaging for urgent concerns.    DIET:  We do recommend a small meal at first, but then you may proceed to your regular diet.  Drink plenty of fluids but you should avoid alcoholic beverages for 24 hours.  ACTIVITY:  You should plan to take it easy for the rest of today and you should NOT  DRIVE or use heavy machinery until tomorrow (because of the sedation medicines used during the test).    FOLLOW UP: Our staff will call the number listed on your records the next business day following your procedure.  We will call around 7:15- 8:00 am to check on you and address any questions or concerns that you may have regarding the information given to you following your procedure. If we do not reach you, we will leave a message.     If any biopsies were taken you will be contacted by phone or by letter within the next 1-3 weeks.  Please call us  at [phone] if you have not heard about the biopsies in 3 weeks.    SIGNATURES/CONFIDENTIALITY: You and/or your care partner have signed paperwork which will be entered into your electronic medical record.  These signatures attest to the fact that that the information above on your After Visit Summary has been reviewed and is understood.  Full responsibility of the confidentiality of this discharge information lies with you and/or your care-partner.

## 2023-11-21 NOTE — Progress Notes (Signed)
 Pt's states no medical or surgical changes since previsit or office visit.

## 2023-11-21 NOTE — Progress Notes (Signed)
 GASTROENTEROLOGY PROCEDURE H&P NOTE   Primary Care Physician: Quilla Snellen, NP-C  HPI: Benjamin Powell is a 78 y.o. male who presents for Colonoscopy for evaluation of history of prior chronic colitis rule out microscopic colitis and persisting chronic diarrhea.  Past Medical History:  Diagnosis Date   Anxiety and depression    BPH with obstruction/lower urinary tract symptoms    Cervical spondylosis    Surgery 2021   Chronic renal insufficiency, stage 3 (moderate) (HCC)    Frequent falls    GERD (gastroesophageal reflux disease)    Hemorrhoids    History of adenomatous polyp of colon    History of CVA (cerebrovascular accident) 10/2016   CTA head and neck with no occlusive dz. +advanced chronic ischemic dz changes on MR   History of DVT (deep vein thrombosis) 2022   L leg.  Hypercoag w/u NEG.   Hypertension    Treatment resistant   Impaired mobility and ADLs    Orthostatic hypotension    OSA on CPAP    PAF (paroxysmal atrial fibrillation) (HCC)    Recurrent nephrolithiasis    Sleep apnea    Type 2 diabetes with nephropathy [facility])    endocrinologist   Urge incontinence    Past Surgical History:  Procedure Laterality Date   ANKLE SURGERY  1993   left   ANTERIOR CERVICAL DECOMP/DISCECTOMY FUSION N/A 10/02/2019   Procedure: ANTERIOR CERVICAL DECOMPRESSION FUSION CERVICAL THREE-FOUR WITH INSTRUMENTATION AND ALLOGRAFT;  Surgeon: Beuford Anes, MD;  Location: [facility] OR;  Service: Orthopedics;  Laterality: N/A;   CATARACT EXTRACTION Right    CHOLECYSTECTOMY  1990   COLONOSCOPY     2013 multiple hyperplastic polyps. 04/08/19 Many hyperplastic/inflammatory polyps, adenoma x1.  11/2021->results pending   ERCP  1990   Hammer Toe Repair Right 09/16/2016   RT #5   Partial Excision Phalanx Left 09/16/2016   LT#5   rib fixation  05/2021   rib surgery     3 titanium ribs did break 4   TONSILLECTOMY AND ADENOIDECTOMY  1998   and sinus for sleep apnea   TRANSTHORACIC  ECHOCARDIOGRAM  07/07/2017   07/2017 EF 55-60%, mild MR, no shunt. 05/2021 EF normal, valves nl.  11/05/21 EF 60-65%, mild LVH, grd I DD, mild MR   Current Outpatient Medications  Medication Sig Dispense Refill   AMBULATORY NON FORMULARY MEDICATION Medication Name: Aloe Gold probiotic daily (Patient not taking: Reported on 11/08/2023)     apixaban (ELIQUIS) 5 MG TABS tablet Take 5 mg by mouth 2 (two) times daily.     Cholecalciferol  125 MCG (5000 UT) TABS Take by mouth.     Continuous Glucose Sensor (DEXCOM G7 SENSOR) MISC by Does not apply route.     dapagliflozin propanediol (FARXIGA) 10 MG TABS tablet Take 10 mg by mouth daily.     Insulin  Human (INSULIN  PUMP) SOLN Inject into the skin. Omipod 5     INSULIN  LISPRO [place] Inject into the skin.     mesalamine  (LIALDA ) 1.2 g EC tablet TAKE 4 TABLETS BY MOUTH ONCE DAILY WITH BREAKFAST 120 tablet 0   metoprolol  succinate (TOPROL  XL) 25 MG 24 hr tablet Take 0.5 tablets (12.5 mg total) by mouth daily. Take with or immediately following a meal. 45 tablet 3   Sodium Sulfate-Mag Sulfate-KCl (SUTAB ) 1479-225-188 MG TABS Use as directed for colonoscopy. MANUFACTURER CODES!! BIN: [id number] PCN: CN GROUP: [id number] MEMBER ID: [id number];MLW AS SECONDARY INSURANCE ;NO PRIOR AUTHORIZATION 24 tablet 0   No current facility-administered medications  for this visit.    Current Outpatient Medications:    AMBULATORY NON FORMULARY MEDICATION, Medication Name: Aloe Gold probiotic daily (Patient not taking: Reported on 11/08/2023), Disp: , Rfl:    apixaban (ELIQUIS) 5 MG TABS tablet, Take 5 mg by mouth 2 (two) times daily., Disp: , Rfl:    Cholecalciferol  125 MCG (5000 UT) TABS, Take by mouth., Disp: , Rfl:    Continuous Glucose Sensor (DEXCOM G7 SENSOR) MISC, by Does not apply route., Disp: , Rfl:    dapagliflozin propanediol (FARXIGA) 10 MG TABS tablet, Take 10 mg by mouth daily., Disp: , Rfl:    Insulin  Human (INSULIN  PUMP) SOLN, Inject into the skin. Omipod 5, Disp: ,  Rfl:    INSULIN  LISPRO [place], Inject into the skin., Disp: , Rfl:    mesalamine  (LIALDA ) 1.2 g EC tablet, TAKE 4 TABLETS BY MOUTH ONCE DAILY WITH BREAKFAST, Disp: 120 tablet, Rfl: 0   metoprolol  succinate (TOPROL  XL) 25 MG 24 hr tablet, Take 0.5 tablets (12.5 mg total) by mouth daily. Take with or immediately following a meal., Disp: 45 tablet, Rfl: 3   Sodium Sulfate-Mag Sulfate-KCl (SUTAB ) 1479-225-188 MG TABS, Use as directed for colonoscopy. MANUFACTURER CODES!! BIN: [id number] PCN: CN GROUP: [id number] MEMBER ID: [id number];MLW AS SECONDARY INSURANCE ;NO PRIOR AUTHORIZATION, Disp: 24 tablet, Rfl: 0 Allergies  Allergen Reactions   Semaglutide Other (See Comments)    GI intolerance   GI intolerance   Family History  Problem Relation Age of Onset   Pancreatic cancer Mother    Cancer Father    Early death Father    Colitis Sister    Diabetes Sister    Stroke Brother    Colitis Brother    Diabetes Brother    Heart attack Brother    Cancer Brother    Stroke Brother    Colon cancer Neg Hx    Esophageal cancer Neg Hx    Rectal cancer Neg Hx    Stomach cancer Neg Hx    Crohn's disease Neg Hx    Colon polyps Neg Hx    Social History   Socioeconomic History   Marital status: Married    Spouse name: Not on file   Number of children: 2   Years of education: Not on file   Highest education level: Not on file  Occupational History   Not on file  Tobacco Use   Smoking status: Former    Current packs/day: 0.00    Average packs/day: 0.3 packs/day for 50.0 years (15.0 ttl pk-yrs)    Types: Cigarettes    Start date: 08/07/1969    Quit date: 08/08/2019    Years since quitting: 4.2   Smokeless tobacco: Never  Vaping Use   Vaping status: Never Used  Substance and Sexual Activity   Alcohol use: No   Drug use: No   Sexual activity: Not on file  Other Topics Concern   Not on file  Social History Narrative   Married   Educ: college   Occup:    52 pack-yr tobacco, quit 2022.   No  alc/drugs.   Social Drivers of Health   Financial Resource Strain: High Risk (09/19/2023)   Received from [facility]   Overall Financial Resource Strain (CARDIA)    Difficulty of Paying Living Expenses: Very hard  Food Insecurity: Food Insecurity Present (09/19/2023)   Received from [facility]   Hunger Vital Sign    Within the past 12 months, you worried that your food would run out  before you got the money to buy more.: Often true    Within the past 12 months, the food you bought just didn't last and you didn't have money to get more.: Often true  Transportation Needs: Unmet Transportation Needs (09/19/2023)   Received from [facility]   PRAPARE - Transportation    Lack of Transportation (Medical): Yes    Lack of Transportation (Non-Medical): Yes  Physical Activity: Inactive (09/19/2023)   Received from [facility]   Exercise Vital Sign    On average, how many days per week do you engage in moderate to strenuous exercise (like a brisk walk)?: 0 days    On average, how many minutes do you engage in exercise at this level?: 10 min  Stress: Stress Concern Present (09/19/2023)   Received from [facility] - Occupational Stress Questionnaire    Feeling of Stress : Very much  Social Connections: Socially Isolated (09/19/2023)   Received from [facility]   Social Network    How would you rate your social network (family, work, friends)?: Little participation, lonely and socially isolated  Intimate Partner Violence: Not At Risk (09/19/2023)   Received from [facility]   HITS    Over the last 12 months how often did your partner physically hurt you?: Never    Over the last 12 months how often did your partner insult you or talk down to you?: Never    Over the last 12 months how often did your partner threaten you with physical harm?: Never    Over the last 12 months how often did your partner scream or curse at you?: Never    Physical  Exam: There were no vitals filed for this visit. There is no height or weight on file to calculate BMI. GEN: NAD EYE: Sclerae anicteric ENT: MMM CV: Non-tachycardic GI: Soft, NT/ND NEURO:  Alert & Oriented x 3  Lab Results: No results for input(s): WBC, HGB, HCT, PLT in the last 72 hours. BMET No results for input(s): NA, K, CL, CO2, GLUCOSE, BUN, CREATININE, CALCIUM  in the last 72 hours. LFT No results for input(s): PROT, ALBUMIN, AST, ALT, ALKPHOS, BILITOT, BILIDIR, IBILI in the last 72 hours. PT/INR No results for input(s): LABPROT, INR in the last 72 hours.   Impression / Plan: This is a 78 y.o.male who presents for Colonoscopy for evaluation of history of prior chronic colitis rule out microscopic colitis and persisting chronic diarrhea.  The risks and benefits of endoscopic evaluation/treatment were discussed with the patient and/or family; these include but are not limited to the risk of perforation, infection, bleeding, missed lesions, lack of diagnosis, severe illness requiring hospitalization, as well as anesthesia and sedation related illnesses.  The patient's history has been reviewed, patient examined, no change in status, and deemed stable for procedure.  The patient and/or family is agreeable to proceed.    Aloha Finner, MD [facility] Advanced Endoscopy Office # [phone]

## 2023-11-22 ENCOUNTER — Telehealth: Payer: Self-pay | Admitting: Lactation Services

## 2023-11-22 NOTE — Telephone Encounter (Signed)
 No answer left voice mail

## 2023-11-23 LAB — SURGICAL PATHOLOGY

## 2023-11-24 ENCOUNTER — Ambulatory Visit: Payer: Self-pay | Admitting: Gastroenterology

## 2023-11-24 NOTE — Progress Notes (Signed)
 Recall colon and office visit has been entered

## 2023-12-15 ENCOUNTER — Telehealth: Payer: Self-pay | Admitting: Gastroenterology

## 2023-12-15 NOTE — Telephone Encounter (Signed)
 PT is calling to have someone discuss his pathology results with him. Please advise.

## 2023-12-15 NOTE — Telephone Encounter (Signed)
 Odetta, [facility] nurse will release the results to the patient. 6 to 91-month follow-up colonoscopy is recommended. Follow-up in clinic with APP or myself in the next 2 to 4 months for further workup of chronic diarrhea. Thanks. GM

## 2023-12-15 NOTE — Telephone Encounter (Signed)
 Left message on machine to call back

## 2023-12-18 NOTE — Telephone Encounter (Signed)
 Left message on machine to call back

## 2023-12-19 NOTE — Telephone Encounter (Signed)
 Left message on machine to call back   Unable to reach pt will await further communication from pt
